# Patient Record
Sex: Male | Born: 1945
Health system: Southern US, Community
[De-identification: ages and names within clinical notes are randomized; demographics above are authoritative.]

## PROBLEM LIST (undated history)

## (undated) DIAGNOSIS — Z87442 Personal history of urinary calculi: Secondary | ICD-10-CM

## (undated) DIAGNOSIS — I2510 Atherosclerotic heart disease of native coronary artery without angina pectoris: Secondary | ICD-10-CM

## (undated) DIAGNOSIS — M1990 Unspecified osteoarthritis, unspecified site: Secondary | ICD-10-CM

## (undated) DIAGNOSIS — M2550 Pain in unspecified joint: Secondary | ICD-10-CM

## (undated) DIAGNOSIS — H269 Unspecified cataract: Secondary | ICD-10-CM

## (undated) DIAGNOSIS — R351 Nocturia: Secondary | ICD-10-CM

## (undated) DIAGNOSIS — K269 Duodenal ulcer, unspecified as acute or chronic, without hemorrhage or perforation: Secondary | ICD-10-CM

## (undated) DIAGNOSIS — I499 Cardiac arrhythmia, unspecified: Secondary | ICD-10-CM

## (undated) DIAGNOSIS — K5790 Diverticulosis of intestine, part unspecified, without perforation or abscess without bleeding: Secondary | ICD-10-CM

## (undated) DIAGNOSIS — E785 Hyperlipidemia, unspecified: Secondary | ICD-10-CM

## (undated) DIAGNOSIS — N3289 Other specified disorders of bladder: Secondary | ICD-10-CM

## (undated) DIAGNOSIS — I209 Angina pectoris, unspecified: Secondary | ICD-10-CM

## (undated) DIAGNOSIS — Z9289 Personal history of other medical treatment: Secondary | ICD-10-CM

## (undated) DIAGNOSIS — K219 Gastro-esophageal reflux disease without esophagitis: Secondary | ICD-10-CM

## (undated) DIAGNOSIS — Z860100 Personal history of colon polyps, unspecified: Secondary | ICD-10-CM

## (undated) DIAGNOSIS — I480 Paroxysmal atrial fibrillation: Secondary | ICD-10-CM

## (undated) HISTORY — DX: Hyperlipidemia, unspecified: E78.5

## (undated) HISTORY — PX: CARDIAC CATHETERIZATION: SHX172

## (undated) HISTORY — DX: Unspecified cataract: H26.9

## (undated) HISTORY — PX: COLONOSCOPY: SHX174

## (undated) HISTORY — DX: Atherosclerotic heart disease of native coronary artery without angina pectoris: I25.10

## (undated) HISTORY — PX: WISDOM TOOTH EXTRACTION: SHX21

---

## 2001-02-24 ENCOUNTER — Encounter (INDEPENDENT_AMBULATORY_CARE_PROVIDER_SITE_OTHER): Payer: Self-pay

## 2001-02-24 ENCOUNTER — Other Ambulatory Visit: Admission: RE | Admit: 2001-02-24 | Discharge: 2001-02-24 | Payer: Self-pay | Admitting: Internal Medicine

## 2003-12-11 ENCOUNTER — Emergency Department (HOSPITAL_COMMUNITY): Admission: EM | Admit: 2003-12-11 | Discharge: 2003-12-11 | Payer: Self-pay | Admitting: Emergency Medicine

## 2005-02-27 ENCOUNTER — Ambulatory Visit: Payer: Self-pay | Admitting: Internal Medicine

## 2005-03-27 ENCOUNTER — Ambulatory Visit: Payer: Self-pay | Admitting: Internal Medicine

## 2005-03-29 ENCOUNTER — Ambulatory Visit: Payer: Self-pay | Admitting: Internal Medicine

## 2005-04-05 ENCOUNTER — Ambulatory Visit: Payer: Self-pay

## 2005-04-25 ENCOUNTER — Encounter: Admission: RE | Admit: 2005-04-25 | Discharge: 2005-04-25 | Payer: Self-pay | Admitting: Internal Medicine

## 2005-08-27 ENCOUNTER — Ambulatory Visit: Payer: Self-pay | Admitting: Internal Medicine

## 2006-04-30 ENCOUNTER — Ambulatory Visit: Payer: Self-pay | Admitting: Internal Medicine

## 2006-07-30 ENCOUNTER — Ambulatory Visit: Payer: Self-pay | Admitting: Internal Medicine

## 2006-08-23 ENCOUNTER — Ambulatory Visit: Payer: Self-pay | Admitting: Internal Medicine

## 2007-08-12 ENCOUNTER — Ambulatory Visit: Payer: Self-pay | Admitting: Internal Medicine

## 2007-08-18 ENCOUNTER — Encounter: Payer: Self-pay | Admitting: Internal Medicine

## 2007-08-21 ENCOUNTER — Encounter: Payer: Self-pay | Admitting: Internal Medicine

## 2007-08-21 ENCOUNTER — Ambulatory Visit: Payer: Self-pay | Admitting: Internal Medicine

## 2008-08-04 ENCOUNTER — Encounter (INDEPENDENT_AMBULATORY_CARE_PROVIDER_SITE_OTHER): Payer: Self-pay | Admitting: *Deleted

## 2008-08-18 ENCOUNTER — Encounter: Payer: Self-pay | Admitting: Internal Medicine

## 2008-12-20 ENCOUNTER — Ambulatory Visit: Payer: Self-pay | Admitting: Internal Medicine

## 2008-12-20 DIAGNOSIS — R198 Other specified symptoms and signs involving the digestive system and abdomen: Secondary | ICD-10-CM | POA: Insufficient documentation

## 2008-12-25 LAB — CONVERTED CEMR LAB
HCT: 46.3 % (ref 39.0–52.0)
MCV: 97.6 fL (ref 78.0–100.0)
Platelets: 237 10*3/uL (ref 150–400)
RDW: 11.8 % (ref 11.5–14.6)

## 2008-12-28 ENCOUNTER — Encounter (INDEPENDENT_AMBULATORY_CARE_PROVIDER_SITE_OTHER): Payer: Self-pay | Admitting: *Deleted

## 2009-02-16 ENCOUNTER — Encounter: Payer: Self-pay | Admitting: Internal Medicine

## 2009-04-29 ENCOUNTER — Encounter: Payer: Self-pay | Admitting: Internal Medicine

## 2009-05-05 ENCOUNTER — Ambulatory Visit: Payer: Self-pay | Admitting: Internal Medicine

## 2009-05-07 LAB — CONVERTED CEMR LAB
ALT: 17 units/L (ref 0–53)
Alkaline Phosphatase: 61 units/L (ref 39–117)
Bilirubin, Direct: 0 mg/dL (ref 0.0–0.3)
Total Protein: 7.2 g/dL (ref 6.0–8.3)

## 2009-05-09 ENCOUNTER — Encounter (INDEPENDENT_AMBULATORY_CARE_PROVIDER_SITE_OTHER): Payer: Self-pay | Admitting: *Deleted

## 2009-05-09 ENCOUNTER — Encounter: Payer: Self-pay | Admitting: Internal Medicine

## 2009-05-25 ENCOUNTER — Ambulatory Visit: Payer: Self-pay | Admitting: Internal Medicine

## 2009-05-25 DIAGNOSIS — E785 Hyperlipidemia, unspecified: Secondary | ICD-10-CM

## 2009-05-25 DIAGNOSIS — F502 Bulimia nervosa: Secondary | ICD-10-CM | POA: Insufficient documentation

## 2009-05-25 HISTORY — DX: Hyperlipidemia, unspecified: E78.5

## 2009-06-14 ENCOUNTER — Telehealth: Payer: Self-pay | Admitting: Internal Medicine

## 2009-09-19 ENCOUNTER — Encounter: Payer: Self-pay | Admitting: Internal Medicine

## 2009-09-20 ENCOUNTER — Ambulatory Visit: Payer: Self-pay | Admitting: Internal Medicine

## 2009-09-20 LAB — CONVERTED CEMR LAB
AST: 19 units/L (ref 0–37)
Albumin: 3.8 g/dL (ref 3.5–5.2)
Alkaline Phosphatase: 71 units/L (ref 39–117)
Hgb A1c MFr Bld: 5.4 % (ref 4.6–6.5)
Total Protein: 7.1 g/dL (ref 6.0–8.3)
VLDL: 17 mg/dL (ref 0.0–40.0)

## 2009-09-26 ENCOUNTER — Ambulatory Visit: Payer: Self-pay | Admitting: Internal Medicine

## 2009-11-23 ENCOUNTER — Telehealth (INDEPENDENT_AMBULATORY_CARE_PROVIDER_SITE_OTHER): Payer: Self-pay | Admitting: *Deleted

## 2009-12-16 ENCOUNTER — Encounter: Payer: Self-pay | Admitting: Internal Medicine

## 2010-08-11 ENCOUNTER — Encounter: Payer: Self-pay | Admitting: Internal Medicine

## 2010-11-21 NOTE — Progress Notes (Signed)
Summary: TRAVEL IMMUNIZATION  Phone Note Call from Patient   Caller: Patient Summary of Call: PT LEFT VM THAT HE WILL BE TRAVELING OUT THE COUNTRY AND WOULD LIKE FOR DR HOPPER TO INFORM HIM IF HE NEEDS TO HAVE A YELLOW FEVER VACCINE.CALL PT BACK LEFT DETAIL MESSAGE PT TO CONTACT TRAVEL CLINIC TO FIND OUT WHAT VACCINE ARE NEEDED........................Marland KitchenFelecia Deloach CMA  November 23, 2009 11:20 AM [phone]

## 2010-11-21 NOTE — Letter (Signed)
Summary: [facility]   Imported By: Lanelle Bal 09/05/2010 09:16:49  _____________________________________________________________________  External Attachment:    Type:   Image     Comment:   External Document

## 2010-11-24 NOTE — Letter (Signed)
Summary: [facility]  [facility]   Imported By: Lanelle Bal 12/22/2009 08:15:00  _____________________________________________________________________  External Attachment:    Type:   Image     Comment:   External Document

## 2011-03-05 ENCOUNTER — Encounter: Payer: Self-pay | Admitting: Internal Medicine

## 2011-03-05 ENCOUNTER — Ambulatory Visit (INDEPENDENT_AMBULATORY_CARE_PROVIDER_SITE_OTHER)
Admission: RE | Admit: 2011-03-05 | Discharge: 2011-03-05 | Disposition: A | Discharge status: Home / Self Care | Payer: Medicare Other | Source: Ambulatory Visit | Attending: Internal Medicine | Admitting: Internal Medicine

## 2011-03-05 ENCOUNTER — Ambulatory Visit (INDEPENDENT_AMBULATORY_CARE_PROVIDER_SITE_OTHER): Payer: Medicare Other | Admitting: Internal Medicine

## 2011-03-05 VITALS — BP 112/74 | Temp 99.3°F | Wt 185.0 lb

## 2011-03-05 DIAGNOSIS — R739 Hyperglycemia, unspecified: Secondary | ICD-10-CM

## 2011-03-05 DIAGNOSIS — R7309 Other abnormal glucose: Secondary | ICD-10-CM

## 2011-03-05 DIAGNOSIS — R079 Chest pain, unspecified: Secondary | ICD-10-CM | POA: Insufficient documentation

## 2011-03-05 DIAGNOSIS — J029 Acute pharyngitis, unspecified: Secondary | ICD-10-CM

## 2011-03-05 LAB — CBC WITH DIFFERENTIAL/PLATELET
Basophils Absolute: 0.1 10*3/uL (ref 0.0–0.1)
Eosinophils Absolute: 0.3 10*3/uL (ref 0.0–0.7)
Lymphocytes Relative: 17.8 % (ref 12.0–46.0)
MCHC: 35 g/dL (ref 30.0–36.0)
MCV: 97.1 fl (ref 78.0–100.0)
Monocytes Absolute: 1.1 10*3/uL — ABNORMAL HIGH (ref 0.1–1.0)
Neutrophils Relative %: 70.9 % (ref 43.0–77.0)
Platelets: 263 10*3/uL (ref 150.0–400.0)
RBC: 4.34 Mil/uL (ref 4.22–5.81)

## 2011-03-05 LAB — HEMOGLOBIN A1C: Hgb A1c MFr Bld: 5.6 % (ref 4.6–6.5)

## 2011-03-05 LAB — BASIC METABOLIC PANEL
Chloride: 103 mEq/L (ref 96–112)
GFR: 110.46 mL/min (ref >60.00)
Potassium: 3.5 mEq/L (ref 3.5–5.1)
Sodium: 138 mEq/L (ref 135–145)

## 2011-03-05 MED ORDER — AMOXICILLIN 500 MG PO CAPS: 1000.0000 mg | ORAL_CAPSULE | Freq: Two times a day (BID) | ORAL | Status: AC

## 2011-03-05 NOTE — Assessment & Plan Note (Signed)
Unclear if related to above. See instructions

## 2011-03-05 NOTE — Patient Instructions (Signed)
Rest , fluids, tylenol , robitussin DM Amoxicillin x 10 days

## 2011-03-05 NOTE — Assessment & Plan Note (Addendum)
Patient presents with sorethroat and chest discomfort, initially I thought CP could be due to tracheitis since he was having URI sx but then, the patient reported similar exertional symptoms 2 weeks ago when he was not having sore throat or any upper respiratory issues. EKG today show sinus rhythm, no acute changes, no old EKGs to compare with.  He has a + FH CAD His A1c at some point was slt elebvated He is a former smoker Plan: Rest, asa, will ask cards to see this week, ER if sx severe. Pt verbalize understanding

## 2011-03-05 NOTE — Progress Notes (Signed)
  Subjective:    Patient ID: Blake [name], male    DOB: [birth year], 65 y.o.   MRN: [id number]  HPI His chief complaint today is sore throat, it started about 4 days ago. However , he  also describes a raw feeling/cold feeling in the chest with exertion first around 2 weeks ago and then again last weekend while walking. The pain is mostly when he takes a deep breath and he is located anteriorly. It is not described as pressure. Pain goes away  after he rests. He wonders if the chest discomfort is related with the sore throat  Past Medical History  Diagnosis Date  . Elevated PSA   . Hyperlipidemia    Past Surgical History  Procedure Date  . Polypectomy     x 2   Family History  Problem Relation Age of Onset  . Coronary artery disease Father   . Heart attack Father 24  . Lymphoma    . Diabetes Sister   . Heart attack Sister 34  . Ovarian cancer Sister   . Atrial fibrillation Brother    History   Social History  . Marital Status: Married    Spouse Name: N/A    Number of Children: 4  . Years of Education: N/A   Occupational History  . retired     Social History Main Topics  . Smoking status: Former Smoker    Quit date: 03/04/1988  . Smokeless tobacco: Not on file  . Alcohol Use: Yes     socially  . Drug Use: Not on file  . Sexually Active: Not on file   Other Topics Concern  . Not on file   Social History Narrative   Regular exercise- yes      Review of Systems No fevers Mild cough No nausea or vomiting. No itchy eyes or itchy nose. Some DOE (?)  during the weekend which is uncommon for him. No LE edema  No GERD sx      Objective:   Physical Exam Alert oriented in no apparent distress. HEENT: Throat is slightly red without discharge, tonsils not visualized. Nose is slightly congested. Ears both tympanic membranes normal. Face is symmetric. Lungs clear to auscultation bilaterally. A few rhonchi. Cardiovascular regular rate and rhythm without  murmur. LE w/o edema        Assessment & Plan:

## 2011-03-06 ENCOUNTER — Encounter: Payer: Self-pay | Admitting: *Deleted

## 2011-03-06 ENCOUNTER — Encounter: Payer: Self-pay | Admitting: Internal Medicine

## 2011-03-09 ENCOUNTER — Encounter: Payer: Self-pay | Admitting: Cardiology

## 2011-03-09 ENCOUNTER — Ambulatory Visit (INDEPENDENT_AMBULATORY_CARE_PROVIDER_SITE_OTHER): Payer: Medicare Other | Admitting: Cardiology

## 2011-03-09 ENCOUNTER — Encounter: Payer: Self-pay | Admitting: *Deleted

## 2011-03-09 DIAGNOSIS — R079 Chest pain, unspecified: Secondary | ICD-10-CM

## 2011-03-09 DIAGNOSIS — R0789 Other chest pain: Secondary | ICD-10-CM

## 2011-03-09 LAB — BASIC METABOLIC PANEL
CO2: 27 mEq/L (ref 19–32)
Calcium: 9.1 mg/dL (ref 8.4–10.5)
GFR: 132.64 mL/min (ref >60.00)
Sodium: 140 mEq/L (ref 135–145)

## 2011-03-09 LAB — CBC WITH DIFFERENTIAL/PLATELET
Basophils Absolute: 0.1 10*3/uL (ref 0.0–0.1)
Eosinophils Relative: 2.4 % (ref 0.0–5.0)
Lymphocytes Relative: 21.8 % (ref 12.0–46.0)
Lymphs Abs: 2.4 10*3/uL (ref 0.7–4.0)
Monocytes Relative: 8.7 % (ref 3.0–12.0)
Neutrophils Relative %: 66.4 % (ref 43.0–77.0)
Platelets: 300 10*3/uL (ref 150.0–400.0)
RDW: 12.6 % (ref 11.5–14.6)
WBC: 10.8 10*3/uL — ABNORMAL HIGH (ref 4.5–10.5)

## 2011-03-09 LAB — PROTIME-INR: INR: 1 ratio (ref 0.8–1.0)

## 2011-03-09 LAB — APTT: aPTT: 28.4 s (ref 21.7–28.8)

## 2011-03-09 NOTE — Patient Instructions (Signed)
.  Your heart catheterization is scheduled for Friday May 25,12.  [facility]30[facility]

## 2011-03-09 NOTE — Progress Notes (Signed)
   Patient ID: Blake [name], male    DOB: December 02, 1945, 65 y.o.   MRN: [id number]  HPI Blake [name] is a 65yo WM who is referred today by Dr Drue Novel for exertional SOB and Chest tightness with a raw feeling.  This began awhile back when mowing the yard. He recently had a bad episode while walking up a hill at his son's graduation. It is relieved with rest. There is no radiation or other associated symptoms.  CRF's include age, sex, FH, obesity, sedentary lifestyle, remote tobacco, and untreated hyperlipidemia...Marland Kitchenhe stopped his pravastatin on his on.  EKG in Dr Leta Jungling office showed NSR with no ST changes.   Review of Systems  All other systems reviewed and are negative.      Physical Exam  Nursing note and vitals reviewed. Constitutional: He is oriented to person, place, and time. He appears well-developed and well-nourished. No distress.       obese  HENT:  Head: Normocephalic and atraumatic.  Eyes: EOM are normal. Pupils are equal, round, and reactive to light.  Neck: Normal range of motion. No JVD present. No tracheal deviation present. No thyromegaly present.  Cardiovascular: Normal rate, regular rhythm, normal heart sounds and intact distal pulses.   No murmur heard.      No carotid bruits  Pulmonary/Chest: Effort normal and breath sounds normal.  Abdominal: Soft. Bowel sounds are normal. He exhibits no distension.  Musculoskeletal: Normal range of motion. He exhibits no edema.  Neurological: He is alert and oriented to person, place, and time.  Skin: Skin is warm and dry.  Psychiatric: He has a normal mood and affect.

## 2011-03-09 NOTE — Assessment & Plan Note (Signed)
This is exertional angina until proven otherwise. A stress study would be of no value and potentially harmful. I have recommended a cardiac cath. Indications, risks, potential benefit discussed. Pt agrees to proceed.

## 2011-03-16 ENCOUNTER — Inpatient Hospital Stay (HOSPITAL_BASED_OUTPATIENT_CLINIC_OR_DEPARTMENT_OTHER)
Admission: RE | Admit: 2011-03-16 | Discharge: 2011-03-16 | Disposition: A | Discharge status: Home / Self Care | Payer: Medicare Other | Source: Ambulatory Visit | Attending: Cardiology | Admitting: Cardiology

## 2011-03-16 DIAGNOSIS — I2510 Atherosclerotic heart disease of native coronary artery without angina pectoris: Secondary | ICD-10-CM | POA: Insufficient documentation

## 2011-03-16 DIAGNOSIS — R0602 Shortness of breath: Secondary | ICD-10-CM | POA: Insufficient documentation

## 2011-03-20 ENCOUNTER — Telehealth: Payer: Self-pay | Admitting: Cardiology

## 2011-03-20 NOTE — Telephone Encounter (Signed)
I spoke with the pt and he said that Dr Riley Kill did his cath on Friday and was suppose to call him after speaking with the surgeons. The pt is wondering what the plan is at this time.  I will speak with Dr Riley Kill about this patient.

## 2011-03-20 NOTE — Telephone Encounter (Signed)
Pt had cath done was told by Dr. Riley Kill to  discuss next plan of care with Dr. Myra Gianotti regarding surgery .

## 2011-03-20 NOTE — Telephone Encounter (Signed)
I spoke with Dr Riley Kill and he would like to arrange an appointment for the pt to see Dr Tyrone Sage tomorrow.

## 2011-03-20 NOTE — Telephone Encounter (Signed)
I spoke with Revonda Standard earlier and she said that Dr Tyrone Sage is currently working in [employer].  She will attempt to reach Dr Tyrone Sage to discuss the plan for this pt. I made Dr Riley Kill aware of this information and he said that Dr Laneta Simmers could also see this patient.  I left this information on Allison's voicemail.

## 2011-03-21 NOTE — Telephone Encounter (Signed)
Dr Riley Kill spoke with the pt by phone and spoke with Revonda Standard at [place] [facility].  Per Dr Ceasar Lund is going to call the pt with a TCTS appointment.

## 2011-03-22 NOTE — Cardiovascular Report (Signed)
NAME:  Blake [name], Blake [name]            ACCOUNT NO.:  [id number]  MEDICAL RECORD NO.:  [id number]            PATIENT TYPE:  LOCATION:                                 FACILITY:  PHYSICIAN:  Arturo Morton. Riley Kill, MD, FACCDATE OF BIRTH:  03/11/1946  DATE OF PROCEDURE:  03/16/2011 DATE OF DISCHARGE:                           CARDIAC CATHETERIZATION   INDICATIONS:  Blake [name] is a very delightful 65 year old gentleman, who presents with shortness of breath while walking across the [place] [facility] and while mowing a lawnmower.  He has a strong family history of coronary artery disease.  He was seen in consultation by Dr. Valera Castle, and set up for diagnostic cardiac catheterization.  Risks, benefits, and alternatives were discussed with the patient in detail. He consented to proceed.  PROCEDURE: 1. Left heart catheterization 2. Selective coronary arteriography. 3. Selective left ventriculography.  DESCRIPTION OF PROCEDURE:  The procedure was performed from the right femoral artery using 4-French catheters.  We upgraded to a 5-curved catheter to better engage the left main.  There were no major complications.  He was taken to the holding area in satisfactory clinical condition.  HEMODYNAMIC DATA: 1. The central aortic pressure was 106/62, mean 82. 2. LV pressure 116/69. 3. There was no gradient on pullback across the aortic valve.  ANGIOGRAPHIC DATA: 1. Ventriculography done in the RAO projection reveals well-preserved     global systolic function without segmental wall motion abnormality. 2. On plain fluoroscopy, there is heavy calcification of all 3     coronary vessels including the left main. 3. The left main coronary artery demonstrates an ostial 40%-50% area     of narrowing.  It is segmentally diseased along its very proximal     portion, there was no damping of the catheter on engagement of the     left main, however. 4. The left anterior descending artery is heavily  calcified.  There is     a long 75% area of stenosis just after two tiny diagonal takeoffs.     This vessel was heavily calcified, and would likely require     percutaneous rotational atherectomy for percutaneous approach.     There is a second area of narrowing of calcified plaque just beyond     the third diagonal, which itself is tiny, involves about 50%     narrowing.  There is some diffuse irregularity of the distal LAD,     but no critical stenoses. 5. The circumflex itself is a large-caliber vessel.  There are some     luminal irregularities with about 30% proximal calcified plaque.     There is a tiny marginal branch followed by a large marginal and     posterolateral branch mostly of which are free of obstructive     disease.  They supplied a large portion of the lateral wall. 6. Right coronary artery is also heavily calcified.  The entire vessel     could be visualized on plain fluoroscopy in terms of location.  The     right coronary artery has multiple areas of mild luminal     irregularities.  There  is areas of narrowing distally there about     30% in terms of luminal reduction.  The posterior descending and     posterolateral branches have somewhat tapered in appearance,     suggesting a probable diffuse plaque.  However, high-grade disease     is not noted and diffuse calcified plaque is noted throughout the     right coronary without significant obstruction.  CONCLUSION: 1. Well-preserved left ventricular function with estimated ejection     fraction in excess of 55%-60%.  No significant valvular     regurgitation is noted. 2. Moderate stenosis involving the ostium of the left main coronary     artery. 3. 75% segmental diffuse heavily calcified plaque involving the     midportion of the left anterior descending artery. 4. Other findings as noted above.  DISCUSSION:  My suspicion is that he is symptomatic from the mid left anterior descending stenosis.  It is heavily  calcified and by its appearance would highly likely require percutaneous rotational atherectomy as an adjunct to stenting.  His left main does not appear to be critical, but nonetheless is calcified, diffusely plaqued, particularly at the ostium.  Multiple options are potentially available. I plan to review these with the cardiovascular surgeons before making a final recommendation.     Arturo Morton. Riley Kill, MD, [facility]     TDS/MEDQ  D:  03/16/2011  T:  03/17/2011  Job:  [id number]  cc:   CV laboratory Jesse Sans. Daleen Squibb, MD, [facility] Titus Dubin. Alwyn Ren, MD,FACP,FCCP  Electronically Signed by Shawnie Pons MD [facility] on 03/22/2011 09:20:31 AM

## 2011-03-23 ENCOUNTER — Ambulatory Visit (HOSPITAL_COMMUNITY)
Admission: RE | Admit: 2011-03-23 | Discharge: 2011-03-23 | Disposition: A | Discharge status: Home / Self Care | Payer: Medicare Other | Source: Ambulatory Visit | Attending: Cardiothoracic Surgery | Admitting: Cardiothoracic Surgery

## 2011-03-23 ENCOUNTER — Encounter (INDEPENDENT_AMBULATORY_CARE_PROVIDER_SITE_OTHER): Payer: Medicare Other | Admitting: Cardiothoracic Surgery

## 2011-03-23 ENCOUNTER — Inpatient Hospital Stay (HOSPITAL_COMMUNITY)
Admission: RE | Admit: 2011-03-23 | Discharge: 2011-03-23 | Disposition: A | Discharge status: Home / Self Care | Payer: Medicare Other | Source: Ambulatory Visit | Attending: Cardiothoracic Surgery | Admitting: Cardiothoracic Surgery

## 2011-03-23 ENCOUNTER — Encounter (HOSPITAL_COMMUNITY)
Admission: RE | Admit: 2011-03-23 | Discharge: 2011-03-23 | Disposition: A | Discharge status: Home / Self Care | Payer: Medicare Other | Source: Ambulatory Visit | Attending: Cardiothoracic Surgery | Admitting: Cardiothoracic Surgery

## 2011-03-23 ENCOUNTER — Other Ambulatory Visit: Payer: Self-pay | Admitting: Cardiothoracic Surgery

## 2011-03-23 DIAGNOSIS — Z01812 Encounter for preprocedural laboratory examination: Secondary | ICD-10-CM | POA: Insufficient documentation

## 2011-03-23 DIAGNOSIS — I2510 Atherosclerotic heart disease of native coronary artery without angina pectoris: Secondary | ICD-10-CM

## 2011-03-23 DIAGNOSIS — Z01811 Encounter for preprocedural respiratory examination: Secondary | ICD-10-CM | POA: Insufficient documentation

## 2011-03-23 DIAGNOSIS — Z01810 Encounter for preprocedural cardiovascular examination: Secondary | ICD-10-CM

## 2011-03-23 DIAGNOSIS — Z01818 Encounter for other preprocedural examination: Secondary | ICD-10-CM | POA: Insufficient documentation

## 2011-03-23 LAB — CBC
HCT: 44.8 % (ref 39.0–52.0)
Hemoglobin: 15.5 g/dL (ref 13.0–17.0)
MCH: 33.3 pg (ref 26.0–34.0)
MCHC: 34.6 g/dL (ref 30.0–36.0)
MCV: 96.1 fL (ref 78.0–100.0)
Platelets: 305 10*3/uL (ref 150–400)
RBC: 4.66 MIL/uL (ref 4.22–5.81)
RDW: 12.7 % (ref 11.5–15.5)
WBC: 9.9 10*3/uL (ref 4.0–10.5)

## 2011-03-23 LAB — URINE MICROSCOPIC-ADD ON

## 2011-03-23 LAB — URINALYSIS, ROUTINE W REFLEX MICROSCOPIC
Bilirubin Urine: NEGATIVE
Glucose, UA: NEGATIVE mg/dL
Ketones, ur: NEGATIVE mg/dL
Leukocytes, UA: NEGATIVE
Nitrite: NEGATIVE
Protein, ur: NEGATIVE mg/dL
Specific Gravity, Urine: 1.02 (ref 1.005–1.030)
Urobilinogen, UA: 0.2 mg/dL (ref 0.0–1.0)
pH: 5 (ref 5.0–8.0)

## 2011-03-23 LAB — COMPREHENSIVE METABOLIC PANEL
ALT: 14 U/L (ref 0–53)
AST: 16 U/L (ref 0–37)
Albumin: 3.8 g/dL (ref 3.5–5.2)
Alkaline Phosphatase: 83 U/L (ref 39–117)
BUN: 13 mg/dL (ref 6–23)
CO2: 22 mEq/L (ref 19–32)
Calcium: 9.5 mg/dL (ref 8.4–10.5)
Chloride: 105 mEq/L (ref 96–112)
Creatinine, Ser: 0.66 mg/dL (ref 0.4–1.5)
GFR calc Af Amer: >60 mL/min (ref >60)
GFR calc non Af Amer: >60 mL/min (ref >60)
Glucose, Bld: 98 mg/dL (ref 70–99)
Potassium: 4.2 mEq/L (ref 3.5–5.1)
Sodium: 139 mEq/L (ref 135–145)
Total Bilirubin: 0.6 mg/dL (ref 0.3–1.2)
Total Protein: 7 g/dL (ref 6.0–8.3)

## 2011-03-23 LAB — BLOOD GAS, ARTERIAL
Acid-base deficit: 1 mmol/L (ref 0.0–2.0)
Bicarbonate: 23.1 mEq/L (ref 20.0–24.0)
Drawn by: 206361
FIO2: 0.21 %
O2 Saturation: 93.2 %
Patient temperature: 98.6
TCO2: 24.3 mmol/L (ref 0–100)
pCO2 arterial: 37.8 mmHg (ref 35.0–45.0)
pH, Arterial: 7.403 (ref 7.350–7.450)
pO2, Arterial: 67 mmHg — ABNORMAL LOW (ref 80.0–100.0)

## 2011-03-23 LAB — ABO/RH: ABO/RH(D): A POS

## 2011-03-23 LAB — SURGICAL PCR SCREEN
MRSA, PCR: NEGATIVE
Staphylococcus aureus: POSITIVE — AB

## 2011-03-23 LAB — PROTIME-INR
INR: 0.94 (ref 0.00–1.49)
Prothrombin Time: 12.8 seconds (ref 11.6–15.2)

## 2011-03-23 LAB — APTT: aPTT: 28 seconds (ref 24–37)

## 2011-03-23 LAB — HEMOGLOBIN A1C
Hgb A1c MFr Bld: 5.5 % (ref <5.7)
Mean Plasma Glucose: 111 mg/dL (ref <117)

## 2011-03-25 NOTE — Consult Note (Signed)
NEW PATIENT CONSULTATION  Blake, [name] DOB:  1946-10-14                                        March 25, 2011 CHART #:  [id number]  PHYSICIAN REQUESTING CONSULTATION:  Arturo Morton. Riley Kill, MD, [facility].  PRIMARY CARDIOLOGIST:  Jesse Sans. Daleen Squibb, MD, [facility]  PRIMARY CARE PHYSICIAN:  Titus Dubin. Hopper, MD,FACP, FCCP  REASON FOR CONSULTATION:  Severe multivessel coronary disease with left main stenosis and class III exertional angina.  CHIEF COMPLAINT:  Exertional shortness of breath and chest tightness.  HISTORY OF PRESENT ILLNESS:  I was asked to evaluate this 65 year old overweight Caucasian male nonsmoker for possible multivessel bypass grafting.  The patient has a strong family history of coronary artery disease.  He recently has had 2 episodes of exertional shortness of breath and chest tightness. The first was walking across [facility] up a hill at a graduation ceremony and the second was while mowing his lawn. He denies any resting symptoms.  He has never had symptoms of coronary disease or MI previously.  There is no history of cardiac arrhythmia and he is in sinus rhythm.  He was evaluated by Dr. Daleen Squibb who recommended cardiac catheterization which was performed on May 25.  This demonstrated very heavily calcified coronaries diffusely.  There is a 50- 60% left main stenosis with heavy calcium.  There is a long 75% stenosis of the LAD with heavy calcification.  There were some minimal luminal irregularities of the right coronary without significant stenosis.  His LVEF was normal and LVEDP was 19 mmHg.  Based on his long LAD stenosis which was heavily calcified, he was not felt to be a good candidate for percutaneous intervention and a surgical evaluation was requested.  The patient has been stable since the cath.  He denies any nocturnal symptoms.  PAST MEDICAL HISTORY: 1. Reformed smoker. 2. Obesity. 3. BPH.  ALLERGIES:  No known drug allergies.  HOME  MEDICATIONS:  Aspirin 81 mg a day and Robitussin p.r.n.  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  He is married with children.  He drinks occasionally and stopped smoking in 1991.  FAMILY HISTORY:  Positive for CAD.  One sister had bypass surgery at age 39.  REVIEW OF SYSTEMS:  CONSTITUTIONAL REVIEW:  Significant for 30-pound weight fluctuations which occurred in cyclical fashion.  He is currently with the high end of his normal weight at 184 pounds.  He denies any night sweats or fever.  ENT REVIEW:  Negative for difficulty swallowing, sleep apnea, or dental disease.  THORACIC REVIEW:  Negative for history of abnormal chest x-ray, chest trauma, hemoptysis, recent symptoms of upper respiratory infection.  CARDIAC REVIEW:  Positive for known coronary disease with preserved LV function.  No history of murmur, valvular disease, or cardiac arrhythmia.  GI REVIEW:  Negative for hepatitis, jaundice, or blood per rectum.  NEUROLOGIC REVIEW:  Positive for his BPH.  ENDOCRINE REVIEW:  Negative for diabetes or thyroid disease.  VASCULAR REVIEW:  Negative DVT, claudication, or TIA. Vascular lab Doppler studies are pending.  NEUROLOGIC REVIEW:  Negative for stroke or seizure.  PHYSICAL EXAMINATION:  VITAL SIGNS:  The patient is 5 feet 6 inches and weighs 185 pounds.  Blood pressure 108/70, pulse 100, respirations 18, saturation 94%. GENERAL:  He is alert and oriented. HEENT:  Normocephalic and pupils are  equal and reactive.  Dentition is good. NECK:  Without JVD, mass, or carotid bruit. LYMPHATICS:  No palpable cervical or supraclavicular adenopathy. LUNGS:  Breath sounds are clear and equal and there is no thoracic deformity. CARDIAC:  Regular rhythm without S3 gallop or murmur. ABDOMEN:  Soft, obese without pulsatile mass or tenderness. EXTREMITIES:  No clubbing, cyanosis, or edema.  Peripheral pulses are 2+ in all extremities. NEUROLOGIC:  He is alert and  oriented without focal motor deficit.  LABORATORY DATA:  In the office his FEV-1 is 2.4 with FEC of 4.0, both over 90% predicted.  Diffusion capacity is normal.  His coronary arteriograms are reviewed and demonstrate Dr. Rosalyn Charters interpretation of severe calcification, diffuse narrowing of approximately 75% of the proximal LAD and extending into the left main.  Right coronary with no hemodynamically significant stenoses.  RECOMMENDATIONS:  The patient is having active angina with exertion in his anatomy which would be difficult to treat percutaneously.  I reviewed the multivessel bypass grafting with grafts being planned to the LAD and circumflex and possibly the first diagonal branch of LAD if it is a large enough target.  Surgery will be scheduled for June 5.  I have discussed the procedure in detail with the patient including indications, alternatives, risks, and expected postoperative covering. He understands and agrees to proceed.  Blake [name], M.D. Electronically Signed  PV/MEDQ  D:  03/25/2011  T:  03/25/2011  Job:  [id number]

## 2011-03-26 ENCOUNTER — Telehealth: Payer: Self-pay | Admitting: Cardiology

## 2011-03-26 NOTE — Telephone Encounter (Signed)
All Cardiac faxed to [facility] @ [phone]   03/26/11/km

## 2011-03-27 ENCOUNTER — Inpatient Hospital Stay (HOSPITAL_COMMUNITY)
Admission: RE | Admit: 2011-03-27 | Discharge: 2011-04-03 | DRG: 236 | Disposition: A | Discharge status: Home Health | Payer: Medicare Other | Source: Ambulatory Visit | Attending: Cardiothoracic Surgery | Admitting: Cardiothoracic Surgery

## 2011-03-27 ENCOUNTER — Inpatient Hospital Stay (HOSPITAL_COMMUNITY): Payer: Medicare Other

## 2011-03-27 DIAGNOSIS — E8779 Other fluid overload: Secondary | ICD-10-CM | POA: Diagnosis not present

## 2011-03-27 DIAGNOSIS — Z7982 Long term (current) use of aspirin: Secondary | ICD-10-CM

## 2011-03-27 DIAGNOSIS — D62 Acute posthemorrhagic anemia: Secondary | ICD-10-CM | POA: Diagnosis not present

## 2011-03-27 DIAGNOSIS — I4891 Unspecified atrial fibrillation: Secondary | ICD-10-CM | POA: Diagnosis not present

## 2011-03-27 DIAGNOSIS — E669 Obesity, unspecified: Secondary | ICD-10-CM | POA: Diagnosis present

## 2011-03-27 DIAGNOSIS — I2510 Atherosclerotic heart disease of native coronary artery without angina pectoris: Secondary | ICD-10-CM

## 2011-03-27 DIAGNOSIS — Z87891 Personal history of nicotine dependence: Secondary | ICD-10-CM

## 2011-03-27 DIAGNOSIS — K5900 Constipation, unspecified: Secondary | ICD-10-CM | POA: Diagnosis not present

## 2011-03-27 DIAGNOSIS — D72829 Elevated white blood cell count, unspecified: Secondary | ICD-10-CM | POA: Diagnosis not present

## 2011-03-27 DIAGNOSIS — N400 Enlarged prostate without lower urinary tract symptoms: Secondary | ICD-10-CM | POA: Diagnosis present

## 2011-03-27 HISTORY — PX: CORONARY ARTERY BYPASS GRAFT: SHX141

## 2011-03-27 LAB — POCT I-STAT 3, ART BLOOD GAS (G3+)
Acid-base deficit: 4 mmol/L — ABNORMAL HIGH (ref 0.0–2.0)
Acid-base deficit: 2 mmol/L (ref 0.0–2.0)
Acid-base deficit: 5 mmol/L — ABNORMAL HIGH (ref 0.0–2.0)
Bicarbonate: 21.3 mEq/L (ref 20.0–24.0)
Bicarbonate: 22.9 mEq/L (ref 20.0–24.0)
Bicarbonate: 20.4 mEq/L (ref 20.0–24.0)
O2 Saturation: 98 %
O2 Saturation: 99 %
O2 Saturation: 99 %
Patient temperature: 36
Patient temperature: 36.6
Patient temperature: 36.8
TCO2: 22 mmol/L (ref 0–100)
TCO2: 24 mmol/L (ref 0–100)
TCO2: 21 mmol/L (ref 0–100)
pCO2 arterial: 37.1 mmHg (ref 35.0–45.0)
pCO2 arterial: 40.1 mmHg (ref 35.0–45.0)
pCO2 arterial: 35.6 mmHg (ref 35.0–45.0)
pH, Arterial: 7.363 (ref 7.350–7.450)
pH, Arterial: 7.363 (ref 7.350–7.450)
pH, Arterial: 7.365 (ref 7.350–7.450)
pO2, Arterial: 106 mmHg — ABNORMAL HIGH (ref 80.0–100.0)
pO2, Arterial: 159 mmHg — ABNORMAL HIGH (ref 80.0–100.0)
pO2, Arterial: 126 mmHg — ABNORMAL HIGH (ref 80.0–100.0)

## 2011-03-27 LAB — CBC
HCT: 30.4 % — ABNORMAL LOW (ref 39.0–52.0)
HCT: 32.7 % — ABNORMAL LOW (ref 39.0–52.0)
Hemoglobin: 10.4 g/dL — ABNORMAL LOW (ref 13.0–17.0)
Hemoglobin: 11 g/dL — ABNORMAL LOW (ref 13.0–17.0)
MCH: 32.5 pg (ref 26.0–34.0)
MCH: 32 pg (ref 26.0–34.0)
MCHC: 34.2 g/dL (ref 30.0–36.0)
MCHC: 33.6 g/dL (ref 30.0–36.0)
MCV: 95 fL (ref 78.0–100.0)
MCV: 95.1 fL (ref 78.0–100.0)
Platelets: 169 10*3/uL (ref 150–400)
Platelets: 174 10*3/uL (ref 150–400)
RBC: 3.2 MIL/uL — ABNORMAL LOW (ref 4.22–5.81)
RBC: 3.44 MIL/uL — ABNORMAL LOW (ref 4.22–5.81)
RDW: 12.3 % (ref 11.5–15.5)
RDW: 12.2 % (ref 11.5–15.5)
WBC: 19 10*3/uL — ABNORMAL HIGH (ref 4.0–10.5)
WBC: 19.7 10*3/uL — ABNORMAL HIGH (ref 4.0–10.5)

## 2011-03-27 LAB — POCT I-STAT, CHEM 8
BUN: 12 mg/dL (ref 6–23)
Calcium, Ion: 1.19 mmol/L (ref 1.12–1.32)
Chloride: 108 mEq/L (ref 96–112)
Creatinine, Ser: 0.8 mg/dL (ref 0.4–1.5)
Glucose, Bld: 116 mg/dL — ABNORMAL HIGH (ref 70–99)
HCT: 31 % — ABNORMAL LOW (ref 39.0–52.0)
Hemoglobin: 10.5 g/dL — ABNORMAL LOW (ref 13.0–17.0)
Potassium: 4.1 mEq/L (ref 3.5–5.1)
Sodium: 139 mEq/L (ref 135–145)
TCO2: 21 mmol/L (ref 0–100)

## 2011-03-27 LAB — POCT I-STAT 4, (NA,K, GLUC, HGB,HCT)
Glucose, Bld: 91 mg/dL (ref 70–99)
HCT: 31 % — ABNORMAL LOW (ref 39.0–52.0)
Hemoglobin: 10.5 g/dL — ABNORMAL LOW (ref 13.0–17.0)
Potassium: 4 mEq/L (ref 3.5–5.1)
Sodium: 140 mEq/L (ref 135–145)

## 2011-03-27 LAB — GLUCOSE, CAPILLARY
Glucose-Capillary: 102 mg/dL — ABNORMAL HIGH (ref 70–99)
Glucose-Capillary: 94 mg/dL (ref 70–99)
Glucose-Capillary: 114 mg/dL — ABNORMAL HIGH (ref 70–99)

## 2011-03-27 LAB — PLATELET COUNT: Platelets: 207 10*3/uL (ref 150–400)

## 2011-03-27 LAB — CREATININE, SERUM
Creatinine, Ser: 0.55 mg/dL (ref 0.4–1.5)
GFR calc Af Amer: >60 mL/min (ref >60)
GFR calc non Af Amer: >60 mL/min (ref >60)

## 2011-03-27 LAB — APTT: aPTT: 36 seconds (ref 24–37)

## 2011-03-27 LAB — HEMOGLOBIN AND HEMATOCRIT, BLOOD
HCT: 30.3 % — ABNORMAL LOW (ref 39.0–52.0)
Hemoglobin: 10.3 g/dL — ABNORMAL LOW (ref 13.0–17.0)

## 2011-03-27 LAB — MAGNESIUM: Magnesium: 2.9 mg/dL — ABNORMAL HIGH (ref 1.5–2.5)

## 2011-03-27 LAB — PROTIME-INR
INR: 1.48 (ref 0.00–1.49)
Prothrombin Time: 18.1 seconds — ABNORMAL HIGH (ref 11.6–15.2)

## 2011-03-28 ENCOUNTER — Inpatient Hospital Stay (HOSPITAL_COMMUNITY): Payer: Medicare Other

## 2011-03-28 DIAGNOSIS — E1165 Type 2 diabetes mellitus with hyperglycemia: Secondary | ICD-10-CM

## 2011-03-28 DIAGNOSIS — IMO0001 Reserved for inherently not codable concepts without codable children: Secondary | ICD-10-CM

## 2011-03-28 LAB — GLUCOSE, CAPILLARY
Glucose-Capillary: 106 mg/dL — ABNORMAL HIGH (ref 70–99)
Glucose-Capillary: 110 mg/dL — ABNORMAL HIGH (ref 70–99)
Glucose-Capillary: 114 mg/dL — ABNORMAL HIGH (ref 70–99)
Glucose-Capillary: 117 mg/dL — ABNORMAL HIGH (ref 70–99)
Glucose-Capillary: 153 mg/dL — ABNORMAL HIGH (ref 70–99)

## 2011-03-28 LAB — CBC
HCT: 31 % — ABNORMAL LOW (ref 39.0–52.0)
Hemoglobin: 10.6 g/dL — ABNORMAL LOW (ref 13.0–17.0)
MCH: 32.5 pg (ref 26.0–34.0)
MCHC: 34.2 g/dL (ref 30.0–36.0)
MCV: 95.1 fL (ref 78.0–100.0)
Platelets: 164 10*3/uL (ref 150–400)
RBC: 3.26 MIL/uL — ABNORMAL LOW (ref 4.22–5.81)
RDW: 12.4 % (ref 11.5–15.5)
WBC: 17.5 10*3/uL — ABNORMAL HIGH (ref 4.0–10.5)

## 2011-03-28 LAB — BASIC METABOLIC PANEL
BUN: 11 mg/dL (ref 6–23)
CO2: 25 mEq/L (ref 19–32)
Calcium: 7.4 mg/dL — ABNORMAL LOW (ref 8.4–10.5)
Chloride: 106 mEq/L (ref 96–112)
Creatinine, Ser: 0.5 mg/dL (ref 0.4–1.5)
GFR calc Af Amer: >60 mL/min (ref >60)
GFR calc non Af Amer: >60 mL/min (ref >60)
Glucose, Bld: 113 mg/dL — ABNORMAL HIGH (ref 70–99)
Potassium: 3.9 mEq/L (ref 3.5–5.1)
Sodium: 137 mEq/L (ref 135–145)

## 2011-03-28 LAB — MAGNESIUM: Magnesium: 2.2 mg/dL (ref 1.5–2.5)

## 2011-03-29 ENCOUNTER — Inpatient Hospital Stay (HOSPITAL_COMMUNITY): Payer: Medicare Other

## 2011-03-29 ENCOUNTER — Encounter: Payer: Medicare Other | Admitting: Physician Assistant

## 2011-03-29 LAB — POCT I-STAT 3, ART BLOOD GAS (G3+)
Acid-Base Excess: 3 mmol/L — ABNORMAL HIGH (ref 0.0–2.0)
Acid-base deficit: 2 mmol/L (ref 0.0–2.0)
Bicarbonate: 27.3 mEq/L — ABNORMAL HIGH (ref 20.0–24.0)
Bicarbonate: 23.1 mEq/L (ref 20.0–24.0)
O2 Saturation: 100 %
O2 Saturation: 99 %
TCO2: 29 mmol/L (ref 0–100)
TCO2: 24 mmol/L (ref 0–100)
pCO2 arterial: 40.3 mmHg (ref 35.0–45.0)
pCO2 arterial: 38 mmHg (ref 35.0–45.0)
pH, Arterial: 7.439 (ref 7.350–7.450)
pH, Arterial: 7.392 (ref 7.350–7.450)
pO2, Arterial: 415 mmHg — ABNORMAL HIGH (ref 80.0–100.0)
pO2, Arterial: 143 mmHg — ABNORMAL HIGH (ref 80.0–100.0)

## 2011-03-29 LAB — POCT I-STAT 4, (NA,K, GLUC, HGB,HCT)
Glucose, Bld: 107 mg/dL — ABNORMAL HIGH (ref 70–99)
Glucose, Bld: 110 mg/dL — ABNORMAL HIGH (ref 70–99)
Glucose, Bld: 100 mg/dL — ABNORMAL HIGH (ref 70–99)
Glucose, Bld: 128 mg/dL — ABNORMAL HIGH (ref 70–99)
Glucose, Bld: 151 mg/dL — ABNORMAL HIGH (ref 70–99)
HCT: 37 % — ABNORMAL LOW (ref 39.0–52.0)
HCT: 37 % — ABNORMAL LOW (ref 39.0–52.0)
HCT: 29 % — ABNORMAL LOW (ref 39.0–52.0)
HCT: 30 % — ABNORMAL LOW (ref 39.0–52.0)
HCT: 29 % — ABNORMAL LOW (ref 39.0–52.0)
Hemoglobin: 12.6 g/dL — ABNORMAL LOW (ref 13.0–17.0)
Hemoglobin: 12.6 g/dL — ABNORMAL LOW (ref 13.0–17.0)
Hemoglobin: 9.9 g/dL — ABNORMAL LOW (ref 13.0–17.0)
Hemoglobin: 10.2 g/dL — ABNORMAL LOW (ref 13.0–17.0)
Hemoglobin: 9.9 g/dL — ABNORMAL LOW (ref 13.0–17.0)
Potassium: 3.4 mEq/L — ABNORMAL LOW (ref 3.5–5.1)
Potassium: 3.9 mEq/L (ref 3.5–5.1)
Potassium: 4.1 mEq/L (ref 3.5–5.1)
Potassium: 4 mEq/L (ref 3.5–5.1)
Potassium: 4.1 mEq/L (ref 3.5–5.1)
Sodium: 143 mEq/L (ref 135–145)
Sodium: 139 mEq/L (ref 135–145)
Sodium: 141 mEq/L (ref 135–145)
Sodium: 136 mEq/L (ref 135–145)
Sodium: 135 mEq/L (ref 135–145)

## 2011-03-29 LAB — BASIC METABOLIC PANEL
BUN: 14 mg/dL (ref 6–23)
CO2: 27 mEq/L (ref 19–32)
Calcium: 8.6 mg/dL (ref 8.4–10.5)
Chloride: 101 mEq/L (ref 96–112)
Creatinine, Ser: 0.62 mg/dL (ref 0.4–1.5)
GFR calc Af Amer: >60 mL/min (ref >60)
GFR calc non Af Amer: >60 mL/min (ref >60)
Glucose, Bld: 128 mg/dL — ABNORMAL HIGH (ref 70–99)
Potassium: 3.6 mEq/L (ref 3.5–5.1)
Sodium: 136 mEq/L (ref 135–145)

## 2011-03-29 LAB — CROSSMATCH
ABO/RH(D): A POS
Antibody Screen: NEGATIVE
Unit division: 0
Unit division: 0

## 2011-03-29 LAB — GLUCOSE, CAPILLARY
Glucose-Capillary: 118 mg/dL — ABNORMAL HIGH (ref 70–99)
Glucose-Capillary: 160 mg/dL — ABNORMAL HIGH (ref 70–99)
Glucose-Capillary: 132 mg/dL — ABNORMAL HIGH (ref 70–99)
Glucose-Capillary: 116 mg/dL — ABNORMAL HIGH (ref 70–99)
Glucose-Capillary: 107 mg/dL — ABNORMAL HIGH (ref 70–99)
Glucose-Capillary: 107 mg/dL — ABNORMAL HIGH (ref 70–99)

## 2011-03-29 LAB — URINALYSIS, ROUTINE W REFLEX MICROSCOPIC
Bilirubin Urine: NEGATIVE
Glucose, UA: NEGATIVE mg/dL
Ketones, ur: NEGATIVE mg/dL
Leukocytes, UA: NEGATIVE
Nitrite: NEGATIVE
Protein, ur: NEGATIVE mg/dL
Specific Gravity, Urine: 1.021 (ref 1.005–1.030)
Urobilinogen, UA: 0.2 mg/dL (ref 0.0–1.0)
pH: 5.5 (ref 5.0–8.0)

## 2011-03-29 LAB — CBC
HCT: 36.1 % — ABNORMAL LOW (ref 39.0–52.0)
Hemoglobin: 12.3 g/dL — ABNORMAL LOW (ref 13.0–17.0)
MCH: 33 pg (ref 26.0–34.0)
MCHC: 34.1 g/dL (ref 30.0–36.0)
MCV: 96.8 fL (ref 78.0–100.0)
Platelets: 172 10*3/uL (ref 150–400)
RBC: 3.73 MIL/uL — ABNORMAL LOW (ref 4.22–5.81)
RDW: 12.7 % (ref 11.5–15.5)
WBC: 24.3 10*3/uL — ABNORMAL HIGH (ref 4.0–10.5)

## 2011-03-29 LAB — URINE MICROSCOPIC-ADD ON

## 2011-03-29 NOTE — Op Note (Signed)
NAMEMarland Kitchen  QUINTARIUS, FERNS NO.:  [id number]  MEDICAL RECORD NO.:  [id number]  LOCATION:  2307                         FACILITY:  [facility]  PHYSICIAN:  Guadalupe Maple, M.D.  DATE OF BIRTH:  [birth year]  DATE OF PROCEDURE:  03/27/2011 DATE OF DISCHARGE:                              OPERATIVE REPORT   PROCEDURE:  Intraoperative transesophageal echocardiography.  Mr. Blake [name] is a 65 year old Caucasian male who developed exertional chest pain and subsequently underwent cardiac catheterization which revealed a 60% left main stenosis and a 75% long stenosis of the LAD with heavy calcification which was not felt amenable to percutaneous angioplasty.  Left ventricular function was normal.  He is now scheduled to undergo coronary artery bypass grafting by Dr. Kathlee Nations Trigt. Intraoperative transesophageal echocardiography was requested to evaluate the left and right ventricular function, to determine if any valvular pathology was present, and to serve as a monitor for intraoperative volume status.  The patient was brought to the operating room at [facility] and general anesthesia was induced without difficulty.  The trachea was intubated without difficulty.  Following orogastric suctioning, the transesophageal echocardiography probe was then inserted into the esophagus without difficulty.  PREBYPASS FINDINGS: 1. Aortic valve.  The aortic valve was trileaflet.  The leaflets     opened normally.  There was no aortic insufficiency.  There was no     calcification of the leaflets.  The leaflets were mildly thickened. 2. Mitral valve.  There was slight redundancy of the mitral leaflets     and the leaflets coapted well with some degree of billowing,     however, this did fulfill the criteria for mitral valve prolapse     because the leaflets did not move above the plane of the mitral     annulus.  There was trace mitral insufficiency.  There were no  fluttering or flail segments noted.  There was very mild mitral     annular calcification. 3. Left ventricle.  There was normal-appearing left ventricular     function.  There was vigorous contractility in all segments and his     ejection fraction was estimated at 60%.  The left ventricular end-     diastolic diameter measured 4.65 cm at end diastole at the mid     papillary level of the short axis.  Left ventricular wall thickness     measured 0.9-0.95 cm at end diastole at the mid papillary level. 4. Right ventricle:  There was normal appearing right ventricular     function.  The right ventricle was of normal size and there was     normal contractility of the right ventricular free wall. 5. Tricuspid valve.  The tricuspid valve appeared structurally intact     with trace tricuspid insufficiency. 6. Pulmonic valve.  The pulmonic valve was visualized and there was no     pulmonic insufficiency that could be appreciated by color Doppler. 7. Left interatrial septum.  The interatrial septum was intact without     evidence of patent foramen ovale or atrial septal defect by color     Doppler. 8. Left atrium.  The left atrial cavity appeared to  be within normal     limits of size.  There was no thrombus noted in the left atrium or     left atrial appendage. 9. Ascending aorta.  The ascending aorta showed a well-defined     sinotubular ridge in the aortic root without evidence of aneurysmal     dilatation of the ascending aorta. There was no significant     atheromatous disease that could be appreciated.  POSTBYPASS FINDINGS: 1. Aortic valve.  The aortic valve was unchanged from the prebypass     study.  There was no aortic insufficiency and the valve leaflets     opened normally. 2. Mitral valve.  There was trace mitral insufficiency with slight     redundancy of the leaflets, but no frank prolapse. 3. Left ventricle.  There was normal-appearing left ventricular     function which  appeared unchanged from the prebypass study. 4. Right ventricle.  There was normal-appearing right ventricular     function postbypass which appeared unchanged from the prebypass     study.          ______________________________ Guadalupe Maple, M.D.     DCJ/MEDQ  D:  03/27/2011  T:  03/28/2011  Job:  [id number]  Electronically Signed by Kipp Brood M.D. on 03/29/2011 09:36:09 AM

## 2011-03-30 ENCOUNTER — Inpatient Hospital Stay (HOSPITAL_COMMUNITY): Payer: Medicare Other

## 2011-03-30 LAB — GLUCOSE, CAPILLARY: Glucose-Capillary: 111 mg/dL — ABNORMAL HIGH (ref 70–99)

## 2011-03-30 LAB — CBC
HCT: 33.1 % — ABNORMAL LOW (ref 39.0–52.0)
Hemoglobin: 11 g/dL — ABNORMAL LOW (ref 13.0–17.0)
MCH: 32.1 pg (ref 26.0–34.0)
MCHC: 33.2 g/dL (ref 30.0–36.0)
MCV: 96.5 fL (ref 78.0–100.0)
Platelets: 178 10*3/uL (ref 150–400)
RBC: 3.43 MIL/uL — ABNORMAL LOW (ref 4.22–5.81)
RDW: 12.7 % (ref 11.5–15.5)
WBC: 20 10*3/uL — ABNORMAL HIGH (ref 4.0–10.5)

## 2011-03-30 LAB — URINE CULTURE
Colony Count: NO GROWTH
Culture  Setup Time: 201206071825
Culture: NO GROWTH

## 2011-03-31 LAB — CBC
HCT: 32.5 % — ABNORMAL LOW (ref 39.0–52.0)
Hemoglobin: 10.9 g/dL — ABNORMAL LOW (ref 13.0–17.0)
MCH: 32.3 pg (ref 26.0–34.0)
MCHC: 33.5 g/dL (ref 30.0–36.0)
MCV: 96.4 fL (ref 78.0–100.0)
Platelets: 215 10*3/uL (ref 150–400)
RBC: 3.37 MIL/uL — ABNORMAL LOW (ref 4.22–5.81)
RDW: 12.7 % (ref 11.5–15.5)
WBC: 17.1 10*3/uL — ABNORMAL HIGH (ref 4.0–10.5)

## 2011-03-31 LAB — BASIC METABOLIC PANEL
BUN: 15 mg/dL (ref 6–23)
CO2: 31 mEq/L (ref 19–32)
Calcium: 8.1 mg/dL — ABNORMAL LOW (ref 8.4–10.5)
Chloride: 99 mEq/L (ref 96–112)
Creatinine, Ser: 0.54 mg/dL (ref 0.4–1.5)
GFR calc Af Amer: >60 mL/min (ref >60)
GFR calc non Af Amer: >60 mL/min (ref >60)
Glucose, Bld: 100 mg/dL — ABNORMAL HIGH (ref 70–99)
Potassium: 3.5 mEq/L (ref 3.5–5.1)
Sodium: 136 mEq/L (ref 135–145)

## 2011-04-01 ENCOUNTER — Inpatient Hospital Stay (HOSPITAL_COMMUNITY): Payer: Medicare Other

## 2011-04-02 DIAGNOSIS — I4891 Unspecified atrial fibrillation: Secondary | ICD-10-CM

## 2011-04-02 LAB — PROTIME-INR
INR: 1.34 (ref 0.00–1.49)
Prothrombin Time: 16.8 seconds — ABNORMAL HIGH (ref 11.6–15.2)

## 2011-04-03 DIAGNOSIS — I4891 Unspecified atrial fibrillation: Secondary | ICD-10-CM

## 2011-04-03 LAB — PROTIME-INR: INR: 1.24 (ref 0.00–1.49)

## 2011-04-05 ENCOUNTER — Ambulatory Visit (INDEPENDENT_AMBULATORY_CARE_PROVIDER_SITE_OTHER): Payer: Medicare Other | Admitting: Cardiology

## 2011-04-05 ENCOUNTER — Other Ambulatory Visit: Payer: Self-pay | Admitting: Cardiothoracic Surgery

## 2011-04-05 DIAGNOSIS — I482 Chronic atrial fibrillation, unspecified: Secondary | ICD-10-CM | POA: Insufficient documentation

## 2011-04-05 DIAGNOSIS — I2510 Atherosclerotic heart disease of native coronary artery without angina pectoris: Secondary | ICD-10-CM

## 2011-04-05 DIAGNOSIS — I4891 Unspecified atrial fibrillation: Secondary | ICD-10-CM

## 2011-04-05 DIAGNOSIS — Z7901 Long term (current) use of anticoagulants: Secondary | ICD-10-CM | POA: Insufficient documentation

## 2011-04-05 NOTE — Progress Notes (Signed)
Cannot sign

## 2011-04-06 ENCOUNTER — Ambulatory Visit
Admission: RE | Admit: 2011-04-06 | Discharge: 2011-04-06 | Disposition: A | Discharge status: Home / Self Care | Payer: Medicare Other | Source: Ambulatory Visit | Attending: Cardiothoracic Surgery | Admitting: Cardiothoracic Surgery

## 2011-04-06 ENCOUNTER — Ambulatory Visit (INDEPENDENT_AMBULATORY_CARE_PROVIDER_SITE_OTHER): Payer: Self-pay | Admitting: Cardiothoracic Surgery

## 2011-04-06 DIAGNOSIS — I2510 Atherosclerotic heart disease of native coronary artery without angina pectoris: Secondary | ICD-10-CM

## 2011-04-07 NOTE — Assessment & Plan Note (Signed)
OFFICE VISIT  Blake [name], Blake [name] DOB:  09/04/1946                                        April 06, 2011 CHART #:  [id number]  CURRENT PROBLEM: 1. Status post CABG x3, March 27, 2011 for left main stenosis with class     III angina. 2. Postoperative atrial fibrillation, converted to sinus rhythm, on     amiodarone and digoxin. 3. Short-term Coumadin for postoperative atrial fibrillation.  HISTORY OF PRESENT ILLNESS:  The patient is a very nice 65 year old gentleman, who was recently discharged from the hospital about 4 days ago after undergoing multivessel bypass grafting.  His hospitalization was somewhat prolonged by postoperative atrial fibrillation, but was eventually converted to sinus rhythm with amiodarone and digoxin as well as Lopressor.  He is started on Coumadin and his last INR 2 days ago was 2.1.  He is currently on 5 mg a day.  He has had some musculoskeletal pain, but no angina, and the surgical incisions are healing well.  There are no symptoms of CHF.  PHYSICAL EXAMINATION:  His blood pressure is 90/60, pulse 70, respirations 16, saturation 95% on room air.  He appears to be very well.  The incisions look fine.  Breath sounds are clear and equal and cardiac rhythm is regular.  A chest x-ray shows some mild atelectasis with no significant effusion.  PLAN:  The patient requests to be able to go to the beach for a long- held reservation for a vacation here in [place].  I think he is adequately recovered for the trip, but he understands the importance of daily walking and diet control and staying out of the heat.  He will continue Coumadin with a 5, alternating with 2.5 mg schedule, and he will return on July 1 with a follow-up INR.  If he has any bleeding complication, he will contact us over the phone.  He will continue his other medications, which include aspirin 81 mg, amiodarone 200 b.i.d., digoxin 0.25 mg a day, metoprolol 25  b.i.d., Zocor 20 mg a day, and pain medicine.  Kerin Perna, M.D. Electronically Signed  PV/MEDQ  D:  04/06/2011  T:  04/07/2011  Job:  [id number]  cc:   Thomas C. Wall, MD, [facility]

## 2011-04-09 NOTE — Progress Notes (Signed)
I can not sign 

## 2011-04-12 ENCOUNTER — Telehealth: Payer: Self-pay | Admitting: Cardiology

## 2011-04-12 HISTORY — PX: ESOPHAGOGASTRODUODENOSCOPY ENDOSCOPY: SHX5814

## 2011-04-12 NOTE — Telephone Encounter (Signed)
Pt son is calling re question re coumadin. Pt son would like to talk to a nurse.

## 2011-04-13 ENCOUNTER — Ambulatory Visit (INDEPENDENT_AMBULATORY_CARE_PROVIDER_SITE_OTHER): Payer: Self-pay | Admitting: Cardiovascular Disease

## 2011-04-13 ENCOUNTER — Telehealth: Payer: Self-pay | Admitting: Cardiology

## 2011-04-13 ENCOUNTER — Encounter: Payer: Self-pay | Admitting: Cardiology

## 2011-04-13 DIAGNOSIS — R0989 Other specified symptoms and signs involving the circulatory and respiratory systems: Secondary | ICD-10-CM

## 2011-04-13 NOTE — Telephone Encounter (Signed)
Per pt son calling. Pt is out of town. C/o blood in stool.  pt on couamdin.

## 2011-04-13 NOTE — Telephone Encounter (Signed)
Pt's son called back, advised pt is experiencing some melena in stool.  Denies BRB, stool is formed, not tarry.  Pt feels fine overall, but concerned since pt is on Coumadin.  Advised pt's son pt was due for an INR check on 04/09/11 and we have been trying to locate pt.  They are out of town at [place].  Told pt's son my recommendation would be to have Coumadin level checked immediately.  I could not give dosage recommendations to skip, half or stay on the same dosage without knowing an INR level.  Son states he had his father take a Zantac in case acid was playing a part, advised that Zantac was not contraindicated, but the best plan is to check INR immediately and then adjust dosage of Coumadin accordingly.    Called pt's son back to verify his father has only been taking 2.5mg  daily.  LMOM this is the dosage he should currently be on.   Pt's son called back 04/13/11 in am states his father has been alternating 2.5/5mg  QOD.  Advised he should have his Coumadin checked ASAP.  Son is going to take him to [facility] and have lab done.  Faxed order to [phone] per their request.  Order for STAT PT/INR faxed with results to [facility].  Will await results and call with dosage instructions once received.  Per son pt has not experienced any worsening symptoms, and feels well overall this am.

## 2011-04-13 NOTE — Telephone Encounter (Signed)
SPOKE WITH PT'S SON C/O BLACK TARRY STOOL  FOR COUPLE OF DAYS  FEELING FINE UNTIL TODAY  STARTED TO FEEL WEAK  WITH  SOME DIZZINESS NOTED  HAS TAKEN DAD  TO HAVE  LABS DONE  PER SON INR  WAS 1.8 AND  HBG  WAS 8.7  HAS DECIDED TO TAKE DAD  TO LOCAL ER FOR EVALUATION AND TREATMENT  INFORMED WILL FORWARD TO DR WALL FOR REVIEW.

## 2011-04-14 DIAGNOSIS — K922 Gastrointestinal hemorrhage, unspecified: Secondary | ICD-10-CM | POA: Insufficient documentation

## 2011-04-16 NOTE — Op Note (Signed)
NAMEMarland Kitchen  CASTER, Blake NO.:  [name]  MEDICAL RECORD NO.:  [id number]  LOCATION:  2307                         FACILITY:  [facility]  PHYSICIAN:  Kerin Perna, M.D.  DATE OF BIRTH:  1946-06-10  DATE OF PROCEDURE:  03/27/2011 DATE OF DISCHARGE:                              OPERATIVE REPORT   OPERATIONS: 1. Coronary artery bypass grafting x3 (left internal mammary artery to     LAD, saphenous vein graft to first diagonal, saphenous vein graft     to obtuse marginal). 2. Endoscopic harvest of right leg greater saphenous vein.  PREOPERATIVE DIAGNOSES:  Severe multivessel coronary artery disease and left main stenosis.  POSTOPERATIVE DIAGNOSES:  Severe multivessel coronary artery disease and left main stenosis.  SURGEON:  Kerin Perna, MD  ASSISTANT:  Doree Fudge, PA-C  ANESTHESIA:  General by Dr. Kipp Brood.  INDICATIONS:  The patient is a 65 year old obese Caucasian male, nonsmoker, presents with exertional chest pain and cardiac catheterization demonstrating a long heavily calcified proximal LAD stenosis of 75% with a heavily calcified ostium of the left main with a 58% stenosis.  LVEF is preserved.  Percutaneous intervention was not recommended and the patient presented for evaluation for bypass surgery. I reviewed results of cardiac cath with the patient and discussed the indications and benefits of coronary artery bypass grafting for treatment of his severe coronary artery disease.  I Also discussed the alternatives to surgery and the risks involved in the operation including risks of stroke, MI, bleeding, blood transfusion requirement, infection, ventilator dependence, and death.  After reviewing these issues, he demonstrated his understanding and agreed to proceed with the surgery under what I felt was an informed consent.  OPERATIVE FINDINGS:  Good-quality conduit and adequate targets, although the diagonal vessel was small at 1.2  mm.  Normal LV function without myocardial scar and fibrosis.  No blood products were required.  PROCEDURE:  The patient was brought to the operating room and placed on the operating table where general anesthesia was induced.  The chest, abdomen and legs were prepped with Betadine and draped as a sterile field.  A transesophageal 2-D echo probe was placed by the anesthesiologist, which documented normal LV function without significant valvular disease.  A sternal incision was made as the saphenous vein was harvested endoscopically from the right leg.  The left internal mammary artery was harvested as a pedicle graft from its origin at the subclavian vessels.  Heparin was administered and the sternal retractor was placed.  Pursestrings were placed in the ascending aorta and right atrium.  When the vein had been harvested and inspected, the patient was cannulated and placed on cardiopulmonary bypass.  The coronary arteries were identified for grafting.  The ascending aorta had mild atherosclerotic changes.  Cardioplegic catheters were placed for both antegrade and retrograde cold blood cardioplegia and the patient was cooled to 32 degrees systemically.  The mammary artery and vein grafts were prepared for the distal anastomoses and the crossclamp was applied.  An 800 mL of cold blood cardioplegia was delivered in split doses between the antegrade aortic and retrograde coronary sinus catheters.  There was good cardioplegic arrest and septal temperature dropped  less than 14 degrees.  Cardioplegia was delivered every 20 minutes or less while the crossclamp was in place.  The distal coronary anastomoses were then performed.  The first distal anastomosis was to the circumflex marginal.  There was a 1.5-1.7 mm vessel with proximal left main stenosis of 50%.  A reverse saphenous vein was sewn end-to-side with running 7-0 Prolene with good flow through the graft.  The second distal anastomosis  was to the first diagonal branch to the LAD.  This is a 1.2-mm vessel with an ostial 80% stenosis.  A reverse saphenous vein was sewn end-to-side with running 7- 0 Prolene with adequate flow through the graft.  Cardioplegia was redosed.  The third distal anastomosis was to the mid-to-distal third LAD.  There was a heavily calcified proximal with 80% stenosis.  The left IMA pedicle was brought through an opening created in the left lateral pericardium was brought down on the LAD and sewn end-to-side with running 8-0 Prolene.  There was good flow through the anastomosis after briefly releasing the pedicle bulldog on the mammary artery.  The bulldog was reapplied and the pedicle was secured at the epicardium. Cardioplegia was redosed.  While the crossclamp was still in place, two proximal vein anastomoses were performed on the ascending aorta using a 4.5-mm vessel and running 7-0 Prolene.  Prior to tying down the final proximal anastomosis, air was vented from the coronary arteries with a dose of retrograde warm blood cardioplegia.  Cross clamp was then removed.  The heart resumed a spontaneous rhythm.  The bypass grafts were checked and found to be hemostatic with a good flow.  Air was aspirated with a 27-gauge needle from a residual air in the vein grafts.  Temporary pacing wires were applied.  The patient was rewarmed to 37 degrees.  The lungs were then re-expanded.  The ventilator was resumed.  The patient was weaned from bypass without difficulty and inotropic support was not required.  Cardiac output was normal and the 2-D echo showed global LV function to be well-preserved.  Protamine was administered without adverse reaction and the cannulas were all removed.  The mediastinum was irrigated.  The leg incision was closed in a standard fashion.  The superior pericardial fat was closed over the aorta.  Two mediastinal and left pleural chest tube were placed and brought out through  separate incisions.  The sternum was closed with interrupted steel wire. Pectoralis fascia and subcutaneous layers were closed in running Vicryl and sterile dressings were applied.  Total cardiopulmonary bypass time was 98 minutes.     Kerin Perna, M.D.     PV/MEDQ  D:  03/27/2011  T:  03/28/2011  Job:  [id number]  cc:   Arturo Morton. Riley Kill, MD, [facility]  Electronically Signed by Kerin Perna M.D. on 04/16/2011 03:06:21 PM

## 2011-04-16 NOTE — Discharge Summary (Signed)
NAME:  Blake [name], Blake [name] NO.:  [id number]  MEDICAL RECORD NO.:  [id number]  LOCATION:                                 FACILITY:  PHYSICIAN:  Kerin Perna, M.D.  DATE OF BIRTH:  02/02/1946  DATE OF ADMISSION: DATE OF DISCHARGE:                              DISCHARGE SUMMARY   FINAL DIAGNOSIS:  Severe multivessel coronary artery disease and left main stenosis.  IN-HOSPITAL DIAGNOSES: 1. Postoperative atrial fibrillation. 2. Volume overload postoperatively. 3. Acute blood loss anemia postoperatively.  SECONDARY DIAGNOSES: 1. Benign prostatic hypertrophy. 2. Obesity. 3. Reformed smoker.  IN-HOSPITAL OPERATIONS AND PROCEDURES: 1. Intraoperative transesophageal echocardiogram done by Dr. Noreene Larsson on     March 27, 2011. 2. Coronary artery bypass grafting x3 using a left internal mammary     artery to left anterior descending, saphenous vein graft to first     diagonal, saphenous vein graft to obtuse marginal.  Endoscopic     saphenous vein harvest of the right leg was done.  This was done by     Dr. Donata Clay on March 27, 2011.  HISTORY AND PHYSICAL AND HOSPITAL COURSE:  The patient is a 65 year old obese Caucasian male, nonsmoker, who presents with exertional chest pain and cardiac catheterization demonstrating a long heavily calcified proximal LAD stenosis 75% with a heavily calcified ostium of the left main with 58% stenosis.  His LVEF is preserved.  The patient was referred to Dr. Donata Clay for further discussion of coronary artery bypass grafting.  Dr. Donata Clay saw and evaluated the patient.  He discussed with the patient undergoing coronary bypass grafting.  He discussed risks and benefits with the patient.  The patient acknowledged understanding and agreed to proceed.  Surgery was scheduled for March 27, 2011.  For further details of the patient's past medical history and physical exam, please see dictated H and P.  The patient was taken to the operating  room on March 27, 2011, where he underwent coronary artery bypass grafting x3 using a left internal mammary artery graft to left anterior descending, saphenous vein graft to first diagonal, saphenous vein graft to obtuse marginal.  Endoscopic vein harvesting of the right leg was done.  The patient tolerated this procedure, was transferred to the intensive care unit in stable condition.  Postoperatively, the patient was noted to be hemodynamically stable.  He was able to be extubated on the evening of surgery. Postextubation, the patient was noted to be alert and oriented x4. Neuro intact.  Postop day #1, the patient was found to be in normal sinus rhythm.  Blood pressure is stable.  All drips were able to be weaned and discontinued.  Chest x-ray obtained on postop day 1 was stable.  The patient had minimal drainage from chest tubes and chest tubes were discontinued in routine fashion.  The patient was started on daily Lasix for volume overload.  He did have some mild acute blood loss anemia with a hemoglobin of 10.6.  The patient was felt to be stable and ready for transfer out to [facility] on postop day 1.  On the telemetry floor, the patient did continue to progress well.  Early morning of  postop day 2, the patient went into rapid atrial fibrillation with heart rate in the 173.  He was started on IV amiodarone.  The patient was able to convert back to normal sinus rhythm with the initiation of amiodarone. Blood pressure was noted to be tolerated well.  He was continued on low- dose beta-blocker.  The patient has remained in normal sinus rhythm and was switched over to p.o. amiodarone today, March 30, 2011.  We will continue to monitor heart rate and blood pressure.  Followup chest x-ray on postop day 2 remained stable, some mild atelectasis.  The patient was encouraged to use his incentive spirometer.  He has been able to be weaned off oxygen with O2 saturations maintaining greater than 90%  on room air.  The patient's hemoglobin/hematocrit remained stable.  He did not require any blood transfusions postoperatively.  Daily weights were followed closely and volume overload was improving.  He is continued on daily Lasix.  The patient did develop leukocytosis with a white blood cell count increasing to 24.3.  He was continued on his Zinacef secondary to increase in his white blood cell count.  Urinalysis was checked, and this was noted to be negative.  The patient had no superficial signs of wound infection.  White blood cell count was rechecked and was back down to 20 by postop day 3.  We will continue to monitor this and continue IV Zinacef for now.  Postoperatively, the patient was up ambulating well without difficulty.  He was tolerating diet well.  No nausea, vomiting noted.  All incisions are clean, dry, and intact and healing well.  On postop day 3, March 30, 2011, the patient is afebrile, normal sinus rhythm, blood pressure is stable.  O2 sats greater than 90% on room air. Most recent lab work shows sodium of 136, potassium 3.6, chloride 101, bicarbonate 27, BUN of 14, creatinine 0.62, glucose 128.  White blood cell count 20.0, hemoglobin of 11.3, hematocrit 33.1, platelet count 178.  The patient is tentatively ready for discharge to home in the next 24-48 hours pending he remains stable.  FOLLOWUP APPOINTMENTS:  Followup appointment has been arranged with Dr. Donata Clay for April 23, 2011, at 1:30 p.m.  The patient will need to obtain PA and lateral chest x-ray 45 minutes prior to this appointment. He will need to follow up with Dr. Riley Kill in 2 weeks.  He will need to contact his office to make these arrangements.  ACTIVITY:  The patient is instructed no driving until released to do so, no lifting over 10 pounds.  He was told to ambulate 3-4 times per day, progress as tolerated, and continue his breathing exercises.  INCISIONAL CARE:  The patient is told to shower  washing his incisions using soap and water.  He is to contact the office if he develops any drainage or opening from any of his incision sites.  DIET:  The patient is educated on diet to be low fat, low salt.  DISCHARGE MEDICATIONS: 1. Amiodarone 400 mg b.i.d. x14 days, then 200 mg b.i.d. 2. Lasix 40 mg daily x5 days. 3. Lopressor 12.5 mg b.i.d. 4. Oxycodone 5 mg 1-2 tablets q.3 h. p.r.n. pain. 5. Potassium chloride 20 mEq daily x5 days. 6. Zocor 20 mg daily. 7. Aspirin enteric coated 325 mg daily.     Sol Blazing, PA   ______________________________ Kerin Perna, M.D.    KMD/MEDQ  D:  03/30/2011  T:  03/31/2011  Job:  [id number]  cc:   Arturo Morton. Riley Kill, MD, [facility]  Electronically Signed by Cameron Proud PA on 04/04/2011 08:59:55 AM Electronically Signed by Kerin Perna M.D. on 04/16/2011 03:06:31 PM

## 2011-04-16 NOTE — Discharge Summary (Signed)
NAME:  Blake [name], Blake [name] NO.:  [id number]  MEDICAL RECORD NO.:  [id number]  LOCATION:  2018                         FACILITY:  [facility]  PHYSICIAN:  Kerin Perna, M.D.  DATE OF BIRTH:  02/13/1946  DATE OF ADMISSION:  03/27/2011 DATE OF DISCHARGE:                              DISCHARGE SUMMARY   ADDENDUM  BRIEF HOSPITAL COURSE STAY:  The patient had T-max of 99.7, but later became afebrile.  He was still experiencing paroxysmal atrial fibrillation.  He also had bigeminy, trigeminy, and PVCs on postop day #4.  As a result, he was given amiodarone 400 mg p.o. 3 times daily on April 01, 2011.  He was then continued on amiodarone 400 mg p.o. 2 times daily.  His Lopressor was increased from 12.5 mg p.o. 2 times daily to 25 mg p.o. 2 times daily and he was started on Coumadin.  His PT and INR are monitored daily.  He did have previous constipation, but then had a bowel movement.  He actually then had loose stools as a result, his Dulcolax and Colace were stopped.  C. diff was obtained and results indicated that it is negative.  He continued to progress with cardiac rehab and was ambulating actually fairly well on his own.  His epicardial pacing wires and chest tube sutures were removed without difficulty.  Currently, on postop day #6, he had T-max 99.3 and slowly became afebrile, heart rate in 80s, BP 95/53, most recent blood pressure is 110/48, O2 sat 97% room air.  Preoperative weight 84 kg, today his weight down to 82.3 kg.  PHYSICAL EXAMINATION:  CARDIOVASCULAR:  Regular rate and rhythm. PULMONARY:  Clear. ABDOMEN:  Soft, nontender.  Bowel sounds present. EXTREMITIES:  Trace lower extremity edema.  Sternal and lower extremity wounds are clean, dry and continuing to heal.  The patient is going to be seen and evaluated by Dr. Donata Clay today to determine what the timing of the patient's discharge.  LATEST LABORATORY STUDIES:  Are as follows:  C. diff as  previously stated negative.  PT/INR 60.8 and 1.34.  Last BMET done on March 31, 2011, potassium 3.5 which has been supplemented, sodium 136, BUN and creatinine 15 and 0.54 respectively.  CBC done on this date H and H 10.9 and 32.5, white count down to 17,100, platelet count 215,000.  Urine culture final no growth.  Last chest x-ray done on April 01, 2011, showed improving left pleural effusion and airspace disease, small right pleural effusion, no pneumothorax.  ADDITIONAL FOLLOWUPS:  Include the patient is to have a PT and INR drawn 48 hours after his discharge from [facility] and he is to contact Dr. Rosalyn Charters office for this follow up date and time.  DISCHARGE MEDICATIONS:  At the time of this dictation include the following: 1. Amiodarone 400 mg p.o. 2 times daily for 10 days, then amiodarone     200 mg p.o. 2  times daily thereafter. 2. Lopressor 25 mg p.o. 2 times daily. 3. Oxycodone 5 mg 1-2 tablets p.o. q.3-4 h p.r.n. pain. 4. Simvastatin 20 mg p.o. nightly. 5. Coumadin 5 mg p.o. daily or as directed by Dr. Rosalyn Charters office. 6. Enteric-coated  aspirin 81 mg p.o. daily.     Doree Fudge, PA   ______________________________ Kerin Perna, M.D.    DZ/MEDQ  D:  04/02/2011  T:  04/03/2011  Job:  [id number]  cc:   Arturo Morton. Riley Kill, MD, [facility]  Electronically Signed by Doree Fudge PA on 04/10/2011 12:33:26 PM Electronically Signed by Kerin Perna M.D. on 04/16/2011 03:06:42 PM

## 2011-04-23 ENCOUNTER — Ambulatory Visit: Payer: Self-pay | Admitting: Cardiothoracic Surgery

## 2011-04-23 ENCOUNTER — Encounter: Payer: Medicare Other | Admitting: Cardiothoracic Surgery

## 2011-04-26 ENCOUNTER — Other Ambulatory Visit: Payer: Self-pay | Admitting: Cardiothoracic Surgery

## 2011-04-26 DIAGNOSIS — I2510 Atherosclerotic heart disease of native coronary artery without angina pectoris: Secondary | ICD-10-CM

## 2011-04-27 ENCOUNTER — Ambulatory Visit: Payer: Self-pay | Admitting: Cardiothoracic Surgery

## 2011-04-27 ENCOUNTER — Ambulatory Visit
Admission: RE | Admit: 2011-04-27 | Discharge: 2011-04-27 | Disposition: A | Discharge status: Home / Self Care | Payer: Medicare Other | Source: Ambulatory Visit | Attending: Cardiothoracic Surgery | Admitting: Cardiothoracic Surgery

## 2011-04-27 ENCOUNTER — Ambulatory Visit (INDEPENDENT_AMBULATORY_CARE_PROVIDER_SITE_OTHER): Payer: Self-pay | Admitting: Cardiothoracic Surgery

## 2011-04-27 DIAGNOSIS — I2510 Atherosclerotic heart disease of native coronary artery without angina pectoris: Secondary | ICD-10-CM

## 2011-04-27 NOTE — Assessment & Plan Note (Signed)
OFFICE VISIT  Blake [name], Blake [name] DOB:  August 23, 1946                                        April 27, 2011 CHART #:  [id number]  HISTORY:  The patient is a 65 year old white male who underwent coronary artery bypass grafting x3 on March 27, 2011, for severe multivessel coronary artery disease and left main stenosis.  Postoperatively, he did have atrial fibrillation that was converted to normal sinus rhythm on amiodarone and digoxin.  He was started on Coumadin as well for this. [place] for severe anemia with endoscopically proven gastric ulcers.  He was transfused 5 units of blood in totality.  His Coumadin was discontinued and he is on a baby aspirin daily.  He states that he had an episode of atrial fibrillation during the endoscopic procedure but at no other time and this was paroxysmal.  Currently, he reports that he is feeling much better.  He denies shortness of breath, chest pain, or abdominal pain. He is on iron and does report that his stools are dark.  He denies dizziness or lightheadedness.  He feels as though his activity progression at this time is fairly good.  He does have some insomnia. He does have some urinary frequency with a known history of benign prostatic hyperplasia.  Chest x-ray was obtained on today's date.  It reveals normal heart size with no active cardiopulmonary disease.  PHYSICAL EXAMINATION:  VITAL SIGNS:  Blood pressure 106/66, pulse is 60 and regular, respirations 18, and oxygen saturation is 98% on room air. GENERAL APPEARANCE:  A well-developed adult male in no acute distress. PULMONARY:  Clear lung fields.  CARDIAC:  Regular rate and rhythm. Normal S1 and S2.  No murmurs, gallops, or rubs.  ABDOMEN:  Soft and nontender.  EXTREMITIES:  No edema.  Incisions healing well without evidence of infection.  ASSESSMENT:  The patient is doing well.  He appears to be very stable  in regard to his gastric ulcer.  We will continue low-dose aspirin at this time.  He is also on Prilosec and iron.  His amiodarone at this point is 200 mg p.o. b.i.d.  Hopefully, this can be discontinued in the next several weeks.  In regards to his surgical recovery, we have encouraged him to increase his ambulation per protocol.  Additionally, I instructed him on protocols for advancement in driving.  I also discussed lifting restrictions.  In regards to his urinary frequency, he reports that he stopped taking oxybutynin on his own approximately a year and half ago. I instructed him to follow up with his urologist in this regard as there may be a better medication for managing this.  We will see him again in approximately 1 month for further postsurgical recovery followup.  Rowe Clack, P.A.-C.  Sherryll Burger  D:  04/27/2011  T:  04/27/2011  Job:  [id number]  cc:   Arturo Morton. Riley Kill, MD, [facility]

## 2011-05-03 ENCOUNTER — Ambulatory Visit (INDEPENDENT_AMBULATORY_CARE_PROVIDER_SITE_OTHER): Payer: Medicare Other | Admitting: Cardiology

## 2011-05-03 ENCOUNTER — Encounter: Payer: Self-pay | Admitting: *Deleted

## 2011-05-03 ENCOUNTER — Encounter: Payer: Self-pay | Admitting: Cardiology

## 2011-05-03 VITALS — BP 112/60 | HR 80 | Resp 14 | Ht 66.0 in | Wt 180.0 lb

## 2011-05-03 DIAGNOSIS — I4891 Unspecified atrial fibrillation: Secondary | ICD-10-CM

## 2011-05-03 DIAGNOSIS — I2510 Atherosclerotic heart disease of native coronary artery without angina pectoris: Secondary | ICD-10-CM

## 2011-05-03 MED ORDER — DIGOXIN 250 MCG PO TABS: 250.0000 ug | ORAL_TABLET | Freq: Every day | ORAL | Status: DC

## 2011-05-03 MED ORDER — AMIODARONE HCL 200 MG PO TABS: 400.0000 mg | ORAL_TABLET | Freq: Every day | ORAL | Status: DC

## 2011-05-03 MED ORDER — METOPROLOL TARTRATE 25 MG PO TABS: 25.0000 mg | ORAL_TABLET | Freq: Two times a day (BID) | ORAL | Status: DC

## 2011-05-03 NOTE — Patient Instructions (Signed)
Your physician has recommended you make the following change in your medication: Pacerone   Your physician recommends that you schedule a follow-up appointment in: August 7,2012 with Dr. Daleen Squibb

## 2011-05-03 NOTE — Progress Notes (Signed)
Addended by: Mylo Red F on: 05/03/2011 04:32 PM   Modules accepted: Orders

## 2011-05-03 NOTE — Assessment & Plan Note (Signed)
He is asymptomatic but obviously going in and out of atrial fib. I do not feel safe in starting Coumadin and will continue with the low dose aspirin. He also does not want to take Coumadin.  I will have him take his amiodarone 400 mg once a day. He remained on digoxin and metoprolol. I'll have him return to see me in 4 weeks. If he is in normal sinus rhythm then we will discontinue her both of these medications. He may need to have his metoprolol increased at that time.

## 2011-05-03 NOTE — Progress Notes (Signed)
HPI Blake [name] returns today post coronary artery bypass grafting for severe three-vessel disease.  When I initially saw in the office, he had classic exertional angina. Catheterization demonstrated severe 3 vessel disease with good LV function. He underwent coronary bypass grafting on June 5. He had postoperative atrial fib was sent home on Coumadin.  [place]. His hemoglobin was low and in a having 5 units of blood transfused. He had endoscopy and had 2 areas that were bleeding and were clipped. He was sent on enteric-coated aspirin 81 mg a day.  He's felt well since that time.  He denies exertional angina or dyspnea on exertion. He  Has mild incisional pain.  [place] he was told he was in normal rhythm most of time except for one brief episode after endoscopy.  Today, his initial EKG showed atrial fib. I checked his pulse and it was regular. We did a rhythm strip which showed normal sinus rhythm. There were no ST segment changes.  When his heart rate goes fast as it didn't hospital he felt some pounding. Otherwise, he's been asymptomatic. Past Medical History  Diagnosis Date  . Elevated PSA   . Hyperlipidemia   . HYPERLIPIDEMIA 05/25/2009  . BINGE EATING DISORDER 05/25/2009  . Chest pain 03/05/2011    Past Surgical History  Procedure Date  . Polypectomy     x 2    Family History  Problem Relation Age of Onset  . Coronary artery disease Father   . Heart attack Father 19  . Lymphoma    . Diabetes Sister   . Heart attack Sister 56  . Ovarian cancer Sister   . Atrial fibrillation Brother     History   Social History  . Marital Status: Married    Spouse Name: N/A    Number of Children: 4  . Years of Education: N/A   Occupational History  . retired     Social History Main Topics  . Smoking status: Former Smoker    Quit date: 03/04/1988  . Smokeless tobacco: Not on file  . Alcohol Use: Yes     socially  . Drug  Use: Not on file  . Sexually Active: Not on file   Other Topics Concern  . Not on file   Social History Narrative   Regular exercise- yes     No Known Allergies  Current Outpatient Prescriptions  Medication Sig Dispense Refill  . amiodarone (PACERONE) 200 MG tablet Take 200 mg by mouth 2 (two) times daily.        [name] aspirin 81 MG tablet Take 81 mg by mouth daily.        . digoxin (LANOXIN) 0.25 MG tablet Take 250 mcg by mouth daily.        . ferrous sulfate 325 (65 FE) MG tablet Take 325 mg by mouth daily with breakfast.        . metoprolol tartrate (LOPRESSOR) 25 MG tablet Take 25 mg by mouth 2 (two) times daily.        [name] omeprazole (PRILOSEC) 40 MG capsule Take 40 mg by mouth daily.        . simvastatin (ZOCOR) 20 MG tablet Take 20 mg by mouth at bedtime.          ROS Negative other than HPI.   PE General Appearance: well developed, well nourished in no acute distress HEENT: symmetrical face, PERRLA, good dentition  Neck: no JVD,  thyromegaly, or adenopathy, trachea midline Chest: symmetric without deformity, incision stable and healing well. Cardiac: PMI non-displaced, RRR, normal S1, S2, no gallop or murmur Lung: clear to ausculation and percussion Vascular: all pulses full without bruits  Abdominal: nondistended, nontender, good bowel sounds, no HSM, no bruits Extremities: no cyanosis, clubbing or edema, no sign of DVT, no varicosities  Skin: normal color, no rashes Neuro: alert and oriented x 3, non-focal Pysch: normal affect Filed Vitals:   05/03/11 1536  BP: 112/60  Pulse: 80  Resp: 14  Height: 5\' 6"  (1.676 m)  Weight: 180 lb (81.647 kg)    EKG  Labs and Studies Reviewed.   Lab Results  Component Value Date   WBC 17.1* 03/31/2011   HGB 10.9* 03/31/2011   HCT 32.5* 03/31/2011   MCV 96.4 03/31/2011   PLT 215 03/31/2011      Chemistry      Component Value Date/Time   NA 136 03/31/2011 0330   K 3.5 03/31/2011 0330   CL 99 03/31/2011 0330   CO2 31 03/31/2011 0330    BUN 15 03/31/2011 0330   CREATININE 0.54 03/31/2011 0330      Component Value Date/Time   CALCIUM 8.1* 03/31/2011 0330   ALKPHOS 83 03/23/2011 1127   AST 16 03/23/2011 1127   ALT 14 03/23/2011 1127   BILITOT 0.6 03/23/2011 1127       Lab Results  Component Value Date   CHOL 194 09/20/2009   Lab Results  Component Value Date   HDL 42.00 09/20/2009   Lab Results  Component Value Date   LDLCALC 135* 09/20/2009   Lab Results  Component Value Date   TRIG 85.0 09/20/2009   Lab Results  Component Value Date   CHOLHDL 5 09/20/2009   Lab Results  Component Value Date   HGBA1C  Value: 5.5 (NOTE)                                                                       According to the ADA Clinical Practice Recommendations for 2011, when HbA1c is used as a screening test:   >=6.5%   Diagnostic of Diabetes Mellitus           (if abnormal result  is confirmed)  5.7-6.4%   Increased risk of developing Diabetes Mellitus  References:Diagnosis and Classification of Diabetes Mellitus,Diabetes Care,2011,34(Suppl 1):S62-S69 and Standards of Medical Care in         Diabetes - 2011,Diabetes Care,2011,34  (Suppl 1):S11-S61. 03/23/2011   Lab Results  Component Value Date   ALT 14 03/23/2011   AST 16 03/23/2011   ALKPHOS 83 03/23/2011   BILITOT 0.6 03/23/2011   Lab Results  Component Value Date   TSH 0.60 12/20/2008

## 2011-05-03 NOTE — Assessment & Plan Note (Signed)
He is status post bypass surgery. He is having no further exertional angina.

## 2011-05-10 ENCOUNTER — Telehealth: Payer: Self-pay | Admitting: Cardiology

## 2011-05-10 NOTE — Telephone Encounter (Signed)
Faxed LOV & EKG to [facility] ([phone]). °

## 2011-05-14 ENCOUNTER — Encounter (HOSPITAL_COMMUNITY)
Admission: RE | Admit: 2011-05-14 | Discharge: 2011-05-14 | Disposition: A | Discharge status: Home / Self Care | Payer: Medicare Other | Source: Ambulatory Visit | Attending: Cardiology | Admitting: Cardiology

## 2011-05-14 DIAGNOSIS — Z951 Presence of aortocoronary bypass graft: Secondary | ICD-10-CM | POA: Insufficient documentation

## 2011-05-14 DIAGNOSIS — I2510 Atherosclerotic heart disease of native coronary artery without angina pectoris: Secondary | ICD-10-CM | POA: Insufficient documentation

## 2011-05-14 DIAGNOSIS — Z87891 Personal history of nicotine dependence: Secondary | ICD-10-CM | POA: Insufficient documentation

## 2011-05-14 DIAGNOSIS — Z5189 Encounter for other specified aftercare: Secondary | ICD-10-CM | POA: Insufficient documentation

## 2011-05-14 DIAGNOSIS — I4891 Unspecified atrial fibrillation: Secondary | ICD-10-CM | POA: Insufficient documentation

## 2011-05-14 DIAGNOSIS — E669 Obesity, unspecified: Secondary | ICD-10-CM | POA: Insufficient documentation

## 2011-05-14 DIAGNOSIS — Z7982 Long term (current) use of aspirin: Secondary | ICD-10-CM | POA: Insufficient documentation

## 2011-05-15 ENCOUNTER — Telehealth: Payer: Self-pay | Admitting: Cardiology

## 2011-05-15 MED ORDER — OMEPRAZOLE 40 MG PO CPDR: 40.0000 mg | DELAYED_RELEASE_CAPSULE | Freq: Every day | ORAL | Status: DC

## 2011-05-15 NOTE — Telephone Encounter (Signed)
Pt aware refill on prilosec sent to [address]. Mylo Red RN

## 2011-05-15 NOTE — Telephone Encounter (Signed)
Pt needs prilosec 40mg  qd called into [facility] [address] this was given by the surgeon but he needs a Rx called in now and please call pt and let him know when this is called in

## 2011-05-16 ENCOUNTER — Encounter (HOSPITAL_COMMUNITY): Payer: Medicare Other

## 2011-05-18 ENCOUNTER — Encounter (HOSPITAL_COMMUNITY): Payer: Medicare Other

## 2011-05-21 ENCOUNTER — Encounter (HOSPITAL_COMMUNITY): Payer: Medicare Other

## 2011-05-23 ENCOUNTER — Encounter (HOSPITAL_COMMUNITY): Payer: Medicare Other | Attending: Cardiology

## 2011-05-23 DIAGNOSIS — I4891 Unspecified atrial fibrillation: Secondary | ICD-10-CM | POA: Insufficient documentation

## 2011-05-23 DIAGNOSIS — E669 Obesity, unspecified: Secondary | ICD-10-CM | POA: Insufficient documentation

## 2011-05-23 DIAGNOSIS — Z951 Presence of aortocoronary bypass graft: Secondary | ICD-10-CM | POA: Insufficient documentation

## 2011-05-23 DIAGNOSIS — Z5189 Encounter for other specified aftercare: Secondary | ICD-10-CM | POA: Insufficient documentation

## 2011-05-23 DIAGNOSIS — Z87891 Personal history of nicotine dependence: Secondary | ICD-10-CM | POA: Insufficient documentation

## 2011-05-23 DIAGNOSIS — Z7982 Long term (current) use of aspirin: Secondary | ICD-10-CM | POA: Insufficient documentation

## 2011-05-23 DIAGNOSIS — I2510 Atherosclerotic heart disease of native coronary artery without angina pectoris: Secondary | ICD-10-CM | POA: Insufficient documentation

## 2011-05-25 ENCOUNTER — Encounter (HOSPITAL_COMMUNITY): Payer: Medicare Other

## 2011-05-28 ENCOUNTER — Encounter (HOSPITAL_COMMUNITY): Payer: Medicare Other

## 2011-05-29 ENCOUNTER — Ambulatory Visit (INDEPENDENT_AMBULATORY_CARE_PROVIDER_SITE_OTHER): Payer: Medicare Other | Admitting: Cardiology

## 2011-05-29 VITALS — BP 112/58 | HR 56 | Ht 66.0 in | Wt 176.0 lb

## 2011-05-29 DIAGNOSIS — Z7901 Long term (current) use of anticoagulants: Secondary | ICD-10-CM

## 2011-05-29 DIAGNOSIS — I4891 Unspecified atrial fibrillation: Secondary | ICD-10-CM

## 2011-05-29 DIAGNOSIS — I2510 Atherosclerotic heart disease of native coronary artery without angina pectoris: Secondary | ICD-10-CM

## 2011-05-29 NOTE — Patient Instructions (Signed)
Your physician has recommended you make the following change in your medication:   STOP Amiodarone, digoxin, iron   Your physician recommends that you schedule a follow-up appointment in: 3 months with Dr. Daleen Squibb

## 2011-05-29 NOTE — Assessment & Plan Note (Signed)
Resolved postop. We'll discontinue amiodarone and digoxin. Patient understands the risk comes back. Will stay on metoprolol.

## 2011-05-29 NOTE — Assessment & Plan Note (Signed)
Stable  Follow up in 3months.

## 2011-05-29 NOTE — Progress Notes (Signed)
HPI Mr. Dancel follows up closely with [place] today or his history of postoperative atrial fibrillation. His had no palpitations. He's had some phantom chest pain otherwise asymptomatic.  He would like to come off with her with medications. Very nicely now on medication. He is still on  Prilosec which he would like to stop as well. Advised to call the gastroenterologist in [place].  EKG shows sinus bradycardia nonspecific changes. Past Medical History  Diagnosis Date  . Elevated PSA   . Hyperlipidemia   . HYPERLIPIDEMIA 05/25/2009  . BINGE EATING DISORDER 05/25/2009  . Chest pain 03/05/2011    Past Surgical History  Procedure Date  . Polypectomy     x 2    Family History  Problem Relation Age of Onset  . Coronary artery disease Father   . Heart attack Father 21  . Lymphoma    . Diabetes Sister   . Heart attack Sister 73  . Ovarian cancer Sister   . Atrial fibrillation Brother     History   Social History  . Marital Status: Married    Spouse Name: N/A    Number of Children: 4  . Years of Education: N/A   Occupational History  . retired     Social History Main Topics  . Smoking status: Former Smoker    Quit date: 03/04/1988  . Smokeless tobacco: Not on file  . Alcohol Use: Yes     socially  . Drug Use: Not on file  . Sexually Active: Not on file   Other Topics Concern  . Not on file   Social History Narrative   Regular exercise- yes     No Known Allergies  Current Outpatient Prescriptions  Medication Sig Dispense Refill  . aspirin 81 MG tablet Take 81 mg by mouth daily.        . metoprolol tartrate (LOPRESSOR) 25 MG tablet Take 1 tablet (25 mg total) by mouth 2 (two) times daily.  60 tablet  6  . omeprazole (PRILOSEC) 40 MG capsule Take 1 capsule (40 mg total) by mouth daily.  30 capsule  6  . simvastatin (ZOCOR) 20 MG tablet Take 20 mg by mouth at bedtime.          ROS Negative other than HPI.   PE General Appearance: well developed, well nourished in  no acute distress HEENT: symmetrical face, PERRLA, good dentition  Neck: no JVD, thyromegaly, or adenopathy, trachea midline Chest: symmetric without deformity Cardiac: PMI non-displaced, RRR, normal S1, S2, no gallop or murmur Lung: clear to ausculation and percussion Vascular: all pulses full without bruits  Abdominal: nondistended, nontender, good bowel sounds, no HSM, no bruits Extremities: no cyanosis, clubbing or edema, no sign of DVT, no varicosities  Skin: normal color, no rashes Neuro: alert and oriented x 3, non-focal Pysch: normal affect Filed Vitals:   05/29/11 1054  BP: 112/58  Pulse: 56  Height: 5\' 6"  (1.676 m)  Weight: 176 lb (79.833 kg)    EKG  Labs and Studies Reviewed.   Lab Results  Component Value Date   WBC 17.1* 03/31/2011   HGB 10.9* 03/31/2011   HCT 32.5* 03/31/2011   MCV 96.4 03/31/2011   PLT 215 03/31/2011      Chemistry      Component Value Date/Time   NA 136 03/31/2011 0330   K 3.5 03/31/2011 0330   CL 99 03/31/2011 0330   CO2 31 03/31/2011 0330   BUN 15 03/31/2011 0330   CREATININE 0.54 03/31/2011  0330      Component Value Date/Time   CALCIUM 8.1* 03/31/2011 0330   ALKPHOS 83 03/23/2011 1127   AST 16 03/23/2011 1127   ALT 14 03/23/2011 1127   BILITOT 0.6 03/23/2011 1127       Lab Results  Component Value Date   CHOL 194 09/20/2009   Lab Results  Component Value Date   HDL 42.00 09/20/2009   Lab Results  Component Value Date   LDLCALC 135* 09/20/2009   Lab Results  Component Value Date   TRIG 85.0 09/20/2009   Lab Results  Component Value Date   CHOLHDL 5 09/20/2009   Lab Results  Component Value Date   HGBA1C  Value: 5.5 (NOTE)                                                                       According to the ADA Clinical Practice Recommendations for 2011, when HbA1c is used as a screening test:   >=6.5%   Diagnostic of Diabetes Mellitus           (if abnormal result  is confirmed)  5.7-6.4%   Increased risk of developing Diabetes Mellitus   References:Diagnosis and Classification of Diabetes Mellitus,Diabetes Care,2011,34(Suppl 1):S62-S69 and Standards of Medical Care in         Diabetes - 2011,Diabetes Care,2011,34  (Suppl 1):S11-S61. 03/23/2011   Lab Results  Component Value Date   ALT 14 03/23/2011   AST 16 03/23/2011   ALKPHOS 83 03/23/2011   BILITOT 0.6 03/23/2011   Lab Results  Component Value Date   TSH 0.60 12/20/2008

## 2011-05-30 ENCOUNTER — Encounter (HOSPITAL_COMMUNITY): Payer: Medicare Other

## 2011-06-01 ENCOUNTER — Telehealth: Payer: Self-pay | Admitting: Cardiology

## 2011-06-01 ENCOUNTER — Encounter (HOSPITAL_COMMUNITY): Payer: Medicare Other

## 2011-06-01 DIAGNOSIS — E7800 Pure hypercholesterolemia, unspecified: Secondary | ICD-10-CM

## 2011-06-01 MED ORDER — SIMVASTATIN 20 MG PO TABS: 20.0000 mg | ORAL_TABLET | Freq: Every day | ORAL | Status: DC

## 2011-06-01 NOTE — Telephone Encounter (Signed)
RX sent to [facility] on [address]. Patient aware.

## 2011-06-01 NOTE — Telephone Encounter (Signed)
Pt needs simvastatin 20mg  qd called into [facility] on [address] and it needs autherization and they asked him to call because we have not responded. Please call pt when this is done so he can go pick it up.

## 2011-06-04 ENCOUNTER — Encounter (HOSPITAL_COMMUNITY): Payer: Medicare Other

## 2011-06-06 ENCOUNTER — Encounter (HOSPITAL_COMMUNITY): Payer: Medicare Other

## 2011-06-08 ENCOUNTER — Encounter (HOSPITAL_COMMUNITY): Payer: Medicare Other

## 2011-06-08 DIAGNOSIS — I2510 Atherosclerotic heart disease of native coronary artery without angina pectoris: Secondary | ICD-10-CM

## 2011-06-08 DIAGNOSIS — N400 Enlarged prostate without lower urinary tract symptoms: Secondary | ICD-10-CM

## 2011-06-08 DIAGNOSIS — K259 Gastric ulcer, unspecified as acute or chronic, without hemorrhage or perforation: Secondary | ICD-10-CM | POA: Insufficient documentation

## 2011-06-08 DIAGNOSIS — E669 Obesity, unspecified: Secondary | ICD-10-CM

## 2011-06-11 ENCOUNTER — Encounter (HOSPITAL_COMMUNITY): Payer: Medicare Other

## 2011-06-13 ENCOUNTER — Ambulatory Visit (INDEPENDENT_AMBULATORY_CARE_PROVIDER_SITE_OTHER): Payer: Self-pay | Admitting: Cardiothoracic Surgery

## 2011-06-13 ENCOUNTER — Encounter (HOSPITAL_COMMUNITY): Payer: Medicare Other

## 2011-06-13 ENCOUNTER — Encounter: Payer: Self-pay | Admitting: Cardiothoracic Surgery

## 2011-06-13 ENCOUNTER — Encounter: Payer: Medicare Other | Admitting: Cardiothoracic Surgery

## 2011-06-13 VITALS — BP 94/56 | HR 62 | Resp 18

## 2011-06-13 DIAGNOSIS — I2510 Atherosclerotic heart disease of native coronary artery without angina pectoris: Secondary | ICD-10-CM | POA: Insufficient documentation

## 2011-06-13 DIAGNOSIS — I4891 Unspecified atrial fibrillation: Secondary | ICD-10-CM

## 2011-06-13 DIAGNOSIS — Z951 Presence of aortocoronary bypass graft: Secondary | ICD-10-CM

## 2011-06-13 NOTE — Progress Notes (Signed)
The patient returns for a 10 week followup after multivessel bypass grafting for a left main disease and unstable angina. The patient is doing well in cardiac rehabilitation what recurrent angina and surgical incisions have all healed nicely. He did develop a postoperative gastric ulcer treated at an outside hospital in that his clinically resolved on oral Prilosec therapy. He has no symptoms of GI bleeding or anemia.  Physical exam Vital signs blood pressure 94/56 pulse 62 regular saturation remained 98% weight 80 kg Gen. appearance is of a middle-aged Caucasian male in no acute no acute distress Chest is clear breath sounds stable sternum via surgical incision is completely healed. Cardiac exam reveals a regular rhythm without S3 gallop rub or murmur. The leg incision is well-healed there is no pedal edema.  Depression The patient is unwell now 10 weeks following surgery. He will continue his normal activity. He knows he can resume yard work and lifting more than 20 pounds for 3 months after surgery. He knows he'll continue his current medications and will be followed by his primary care physician cardiologist return here as needed.

## 2011-06-13 NOTE — Patient Instructions (Signed)
The patient will finish his cardiac rehabilitation at the hospital. The patient will limit his lifting to less than 20 pounds until 3 months after surgery. The patient will be able to resume a yardwork another routine activities. The patient understands she can resume sexual activity.

## 2011-06-15 ENCOUNTER — Encounter (HOSPITAL_COMMUNITY): Payer: Medicare Other

## 2011-06-18 ENCOUNTER — Encounter (HOSPITAL_COMMUNITY): Payer: Medicare Other

## 2011-06-20 ENCOUNTER — Encounter (HOSPITAL_COMMUNITY): Payer: Medicare Other

## 2011-06-22 ENCOUNTER — Encounter (HOSPITAL_COMMUNITY): Payer: Medicare Other

## 2011-06-25 ENCOUNTER — Encounter (HOSPITAL_COMMUNITY): Payer: Medicare Other

## 2011-06-27 ENCOUNTER — Encounter (HOSPITAL_COMMUNITY): Payer: Medicare Other | Attending: Cardiology

## 2011-06-27 DIAGNOSIS — Z7982 Long term (current) use of aspirin: Secondary | ICD-10-CM | POA: Insufficient documentation

## 2011-06-27 DIAGNOSIS — Z5189 Encounter for other specified aftercare: Secondary | ICD-10-CM | POA: Insufficient documentation

## 2011-06-27 DIAGNOSIS — I2510 Atherosclerotic heart disease of native coronary artery without angina pectoris: Secondary | ICD-10-CM | POA: Insufficient documentation

## 2011-06-27 DIAGNOSIS — I4891 Unspecified atrial fibrillation: Secondary | ICD-10-CM | POA: Insufficient documentation

## 2011-06-27 DIAGNOSIS — Z951 Presence of aortocoronary bypass graft: Secondary | ICD-10-CM | POA: Insufficient documentation

## 2011-06-27 DIAGNOSIS — E669 Obesity, unspecified: Secondary | ICD-10-CM | POA: Insufficient documentation

## 2011-06-27 DIAGNOSIS — Z87891 Personal history of nicotine dependence: Secondary | ICD-10-CM | POA: Insufficient documentation

## 2011-06-29 ENCOUNTER — Encounter (HOSPITAL_COMMUNITY): Payer: Medicare Other

## 2011-07-02 ENCOUNTER — Encounter (HOSPITAL_COMMUNITY): Payer: Medicare Other

## 2011-07-04 ENCOUNTER — Encounter (HOSPITAL_COMMUNITY): Payer: Medicare Other

## 2011-07-06 ENCOUNTER — Encounter (HOSPITAL_COMMUNITY): Payer: Medicare Other

## 2011-07-09 ENCOUNTER — Encounter (HOSPITAL_COMMUNITY): Payer: Medicare Other

## 2011-07-11 ENCOUNTER — Encounter (HOSPITAL_COMMUNITY): Payer: Medicare Other

## 2011-07-13 ENCOUNTER — Encounter (HOSPITAL_COMMUNITY): Payer: Medicare Other

## 2011-07-16 ENCOUNTER — Encounter (HOSPITAL_COMMUNITY): Payer: Medicare Other

## 2011-07-18 ENCOUNTER — Encounter (HOSPITAL_COMMUNITY): Payer: Medicare Other

## 2011-07-20 ENCOUNTER — Encounter (HOSPITAL_COMMUNITY): Payer: Medicare Other

## 2011-07-23 ENCOUNTER — Encounter (HOSPITAL_COMMUNITY): Payer: Medicare Other

## 2011-07-25 ENCOUNTER — Encounter (HOSPITAL_COMMUNITY): Payer: Medicare Other | Attending: Cardiology

## 2011-07-25 DIAGNOSIS — Z5189 Encounter for other specified aftercare: Secondary | ICD-10-CM | POA: Insufficient documentation

## 2011-07-25 DIAGNOSIS — I4891 Unspecified atrial fibrillation: Secondary | ICD-10-CM | POA: Insufficient documentation

## 2011-07-25 DIAGNOSIS — I2510 Atherosclerotic heart disease of native coronary artery without angina pectoris: Secondary | ICD-10-CM | POA: Insufficient documentation

## 2011-07-25 DIAGNOSIS — Z7982 Long term (current) use of aspirin: Secondary | ICD-10-CM | POA: Insufficient documentation

## 2011-07-25 DIAGNOSIS — E669 Obesity, unspecified: Secondary | ICD-10-CM | POA: Insufficient documentation

## 2011-07-25 DIAGNOSIS — Z951 Presence of aortocoronary bypass graft: Secondary | ICD-10-CM | POA: Insufficient documentation

## 2011-07-25 DIAGNOSIS — Z87891 Personal history of nicotine dependence: Secondary | ICD-10-CM | POA: Insufficient documentation

## 2011-07-26 ENCOUNTER — Ambulatory Visit (INDEPENDENT_AMBULATORY_CARE_PROVIDER_SITE_OTHER): Payer: Medicare Other | Admitting: Internal Medicine

## 2011-07-26 DIAGNOSIS — R1032 Left lower quadrant pain: Secondary | ICD-10-CM

## 2011-07-26 DIAGNOSIS — N400 Enlarged prostate without lower urinary tract symptoms: Secondary | ICD-10-CM

## 2011-07-26 DIAGNOSIS — R319 Hematuria, unspecified: Secondary | ICD-10-CM

## 2011-07-26 DIAGNOSIS — R350 Frequency of micturition: Secondary | ICD-10-CM

## 2011-07-26 DIAGNOSIS — R109 Unspecified abdominal pain: Secondary | ICD-10-CM

## 2011-07-26 LAB — POCT URINALYSIS DIPSTICK
Leukocytes, UA: NEGATIVE
Nitrite, UA: NEGATIVE
Protein, UA: NEGATIVE
Urobilinogen, UA: 0.2
pH, UA: 6

## 2011-07-26 NOTE — Progress Notes (Signed)
  Subjective:    Patient ID: Blake [name], male    DOB: 1945-12-04, 65 y.o.   MRN: [id number]  HPI ABDOMINAL PAIN: Location: LLQ Onset: 3 weeks ago   Radiation: no  Severity: up to 1 Quality: dull  Duration: seconds, intermittently  Better with: no Worse with: no triggers Symptoms Nausea/Vomiting: no  Diarrhea: no  Constipation: with meds since heart surgery  Melena/BRBPR: no  Hematemesis: no  Anorexia: no  Fever/Chills: no  Dysuria/ hematuria/ pyuria: only mild intermittent dysuria  Rash: no  Wt loss: no    Past Surgeries: last colonoscopy 2010 negative PMH:no GU disease except BPH , followed by [facility]      Review of Systems     Objective:   Physical Exam General appearance is one of good health and nourishment w/o distress.  Eyes: No conjunctival inflammation or scleral icterus is present.  Oral exam: Dental hygiene is good; lips and gums are healthy appearing.There is no oropharyngeal erythema or exudate noted.   Heart:  Normal rate and regular rhythm. S1 and S2 normal without gallop, murmur, click, rub .S4  Lungs:Chest clear to auscultation; no wheezes, rhonchi,rales ,or rubs present.No increased work of breathing.   Abdomen: bowel sounds normal, soft  But minimally tender suprapubic area > LLQ without masses, organomegaly or hernias noted.  No guarding or rebound   Skin:Warm & dry.  Intact without suspicious lesions or rashes ; no jaundice or tenting  Lymphatic: No lymphadenopathy is noted about the head, neck, axilla, or inguinal areas.   Genitourinary/digital rectal exam: Small varicoele in the left scrotum. No inguinal hernias. Prostate is significantly enlarged without tenderness,asymmetry, induration, or nodules. No adnexal tenderness.             Assessment & Plan:    #1 mild left lower pontine discomfort; discomfort to palpation in the suprapubic area.  #2 benign prostatic hypertrophy  #3 moderate microscopic  hematuria  Plan: Urologic  followup is indicated for the prostate and microscopic hematuria. Urine will be cultured.

## 2011-07-26 NOTE — Patient Instructions (Signed)
Drink to thirst, up to 40 ounces of water daily. Strain urine until seen by Urology.

## 2011-07-27 ENCOUNTER — Encounter (HOSPITAL_COMMUNITY): Payer: Medicare Other

## 2011-07-30 ENCOUNTER — Encounter (HOSPITAL_COMMUNITY): Payer: Medicare Other

## 2011-08-01 ENCOUNTER — Encounter (HOSPITAL_COMMUNITY): Payer: Medicare Other

## 2011-08-03 ENCOUNTER — Encounter (HOSPITAL_COMMUNITY): Payer: Medicare Other

## 2011-08-06 ENCOUNTER — Encounter (HOSPITAL_COMMUNITY): Payer: Medicare Other

## 2011-08-08 ENCOUNTER — Encounter (HOSPITAL_COMMUNITY): Payer: Medicare Other

## 2011-08-10 ENCOUNTER — Encounter (HOSPITAL_COMMUNITY): Payer: Medicare Other

## 2011-08-13 ENCOUNTER — Encounter (HOSPITAL_COMMUNITY): Payer: Medicare Other

## 2011-08-15 ENCOUNTER — Encounter (HOSPITAL_COMMUNITY): Payer: Medicare Other

## 2011-08-17 ENCOUNTER — Encounter (HOSPITAL_COMMUNITY): Payer: Medicare Other

## 2011-08-24 ENCOUNTER — Telehealth: Payer: Self-pay | Admitting: Internal Medicine

## 2011-08-24 NOTE — Telephone Encounter (Signed)
Call return patient [phone]. Patient stated that he has headaches and chest congestion. He was treated while in [place] for dehydration & Salmonella with IV Antibiotics Cipro & Metronidazole. He stated that he was not sure if he was treated properly, however he did have some improvement. He states that he continues to have a dull minor constant dull ache in his abdomen,  chest congestion, non- productive to productive cough, taking acetaminophen bid x 4 days , has not checked temp: fever unknown. He has stated that has sharp pains with coughing.   After speaking with Dr Alwyn Ren he advised patient evaluation. Call was returned to patient. He was advised per Dr Alwyn Ren instruction. Patient was offered appointment today, informed by scheduling all providers schedule were full. Patient was advised Urgent care or Saturday clinic. Patient elected to be seen at [facility]. Appointment scheduled for Saturday 08/25/2011 @ 9 am.

## 2011-08-25 ENCOUNTER — Ambulatory Visit (INDEPENDENT_AMBULATORY_CARE_PROVIDER_SITE_OTHER): Payer: Medicare Other | Admitting: Family Medicine

## 2011-08-25 ENCOUNTER — Ambulatory Visit (HOSPITAL_COMMUNITY): Admission: RE | Admit: 2011-08-25 | Payer: Medicare Other | Source: Ambulatory Visit | Admitting: Family Medicine

## 2011-08-25 ENCOUNTER — Other Ambulatory Visit: Payer: Self-pay | Admitting: Family Medicine

## 2011-08-25 ENCOUNTER — Encounter: Payer: Self-pay | Admitting: Family Medicine

## 2011-08-25 ENCOUNTER — Ambulatory Visit (HOSPITAL_COMMUNITY)
Admission: RE | Admit: 2011-08-25 | Discharge: 2011-08-25 | Disposition: A | Discharge status: Home / Self Care | Payer: Medicare Other | Source: Ambulatory Visit | Attending: Family Medicine | Admitting: Family Medicine

## 2011-08-25 DIAGNOSIS — R05 Cough: Secondary | ICD-10-CM | POA: Insufficient documentation

## 2011-08-25 DIAGNOSIS — R0602 Shortness of breath: Secondary | ICD-10-CM

## 2011-08-25 DIAGNOSIS — R319 Hematuria, unspecified: Secondary | ICD-10-CM

## 2011-08-25 DIAGNOSIS — R109 Unspecified abdominal pain: Secondary | ICD-10-CM | POA: Insufficient documentation

## 2011-08-25 DIAGNOSIS — R5381 Other malaise: Secondary | ICD-10-CM | POA: Insufficient documentation

## 2011-08-25 DIAGNOSIS — I4891 Unspecified atrial fibrillation: Secondary | ICD-10-CM

## 2011-08-25 DIAGNOSIS — R5383 Other fatigue: Secondary | ICD-10-CM | POA: Insufficient documentation

## 2011-08-25 DIAGNOSIS — R059 Cough, unspecified: Secondary | ICD-10-CM | POA: Insufficient documentation

## 2011-08-25 LAB — POCT URINALYSIS DIPSTICK
Ketones, UA: NEGATIVE
Protein, UA: NEGATIVE
Spec Grav, UA: 1.015
pH, UA: 6.5

## 2011-08-25 LAB — COMPLETE METABOLIC PANEL WITH GFR
CO2: 27 mEq/L (ref 19–32)
Creat: 0.72 mg/dL (ref 0.50–1.35)
GFR, Est African American: >89 mL/min (ref >89)
GFR, Est Non African American: >89 mL/min (ref >89)
Glucose, Bld: 90 mg/dL (ref 70–99)
Total Bilirubin: 0.4 mg/dL (ref 0.3–1.2)

## 2011-08-25 LAB — CBC WITH DIFFERENTIAL/PLATELET
Basophils Relative: 1 % (ref 0–1)
Hemoglobin: 14.5 g/dL (ref 13.0–17.0)
Lymphs Abs: 2.2 10*3/uL (ref 0.7–4.0)
MCHC: 33 g/dL (ref 30.0–36.0)
Monocytes Relative: 9 % (ref 3–12)
Neutro Abs: 10 10*3/uL — ABNORMAL HIGH (ref 1.7–7.7)
Neutrophils Relative %: 72 % (ref 43–77)
Platelets: 390 10*3/uL (ref 150–400)
RBC: 4.61 MIL/uL (ref 4.22–5.81)

## 2011-08-25 MED ORDER — DOXYCYCLINE HYCLATE 100 MG PO CAPS: 100.0000 mg | ORAL_CAPSULE | Freq: Two times a day (BID) | ORAL | Status: AC

## 2011-08-25 NOTE — Assessment & Plan Note (Signed)
Regular and rate controlled today.

## 2011-08-25 NOTE — Assessment & Plan Note (Signed)
Will check CXR stat for bacterial pneumonia to direct care and antibiotic choice.. Given not improving over last 10 days and recent stress on immune system.. We will cover for atypical PNA and bronchitits with doxycycline 100 BID x 10 days EVEN IF  CXR returns normal.  Follow up in next few days with primary MD.

## 2011-08-25 NOTE — Patient Instructions (Signed)
Push fluids, rest, we will call with lab, CXR and culture results. Do not fill prescription of antibiotics yet until CXR returns.

## 2011-08-25 NOTE — Progress Notes (Signed)
  Subjective:    Patient ID: Blake [name], male    DOB: 1946-06-03, 65 y.o.   MRN: [id number]  HPI  65 year old male with hx of CAD. afib presents with recent  hx of hospitalization in [place]  Dx with dehydration, salmonella, ameoba infection and UTI.   Had been seen off and on by PCP prior with  Left low abdominal pain.Marland Kitchenin mid October, was ongoing x 3 weeks... Thought likely kidney stone at that time.  4 days after being in [place] he had severe fatigue, abdominal pain. Diarrhea. Hospitalized.. Dx with dehydration, salmonella, ameoba infection and UTI.   Cr and Hg was nml. EKG x  Records reviewed as able  but in Spanish. Given IV fluid. Given 2 different antibiotics on IV. Discharged with cipro x 5 days and metronidazole x 7 days, completed now.  Returned on 10/23. He feels he has not felt much better. Continue fatigue, mainly. Diarrhea resolved. Left lower abdominal pain resolved now , but off and on dull pain in central low abdomen.  No dysuria, no change in urination.  More pain in abdomen when he coughs.  Cough, mildly productive and chest congestion in last week. Runny nose.  Off and on headache. No fever. Starting yesterday haad bloody nasal discharge. No ear pain or face pain.  SOB with walking, and extreme fatigue, no wheeze.       Review of Systems  Constitutional: Positive for fatigue. Negative for fever and chills.  HENT: Positive for congestion, rhinorrhea and postnasal drip. Negative for ear pain, sneezing and neck pain.   Eyes: Negative for pain.  Respiratory: Positive for cough and shortness of breath.   Cardiovascular: Negative for chest pain, palpitations and leg swelling.  Genitourinary: Negative for dysuria, flank pain and penile pain.       Objective:   Physical Exam  Constitutional:       Fatigued appearing male in NAD  HENT:  Head: Normocephalic.  Right Ear: External ear normal.  Left Ear: External ear normal.  Nose: Mucosal edema and  rhinorrhea present.  Mouth/Throat: Uvula is midline, oropharynx is clear and moist and mucous membranes are normal. No oropharyngeal exudate, posterior oropharyngeal edema or posterior oropharyngeal erythema.  Eyes: Conjunctivae, EOM and lids are normal. Pupils are equal, round, and reactive to light.  Neck: Normal range of motion. Neck supple. No mass and no thyromegaly present.  Cardiovascular: Normal rate, normal heart sounds and intact distal pulses.  Exam reveals no gallop and no friction rub.   No murmur heard. Pulmonary/Chest: Effort normal and breath sounds normal. No respiratory distress. He has no wheezes. He has no rales. He exhibits no tenderness.  Abdominal: Soft. Bowel sounds are normal. He exhibits no distension and no mass. There is no hepatosplenomegaly. There is tenderness in the suprapubic area. There is no rebound, no guarding and no CVA tenderness.          Assessment & Plan:

## 2011-08-25 NOTE — Assessment & Plan Note (Addendum)
Recent salmonella, aomeba infeciton.. Treated with broad antibiotics (cipro and flagyl).. Diarrhea resolved. UTI, ? Specific bacteria type (no culture seen in results), but treated with Cipro. UA today shows blood in urine.. Will send for culture. Certainly could be due to stone moving, but pt with minimal pain.

## 2011-08-26 LAB — URINE CULTURE
Colony Count: NO GROWTH
Organism ID, Bacteria: NO GROWTH

## 2011-08-31 ENCOUNTER — Encounter: Payer: Self-pay | Admitting: Cardiology

## 2011-08-31 ENCOUNTER — Ambulatory Visit (INDEPENDENT_AMBULATORY_CARE_PROVIDER_SITE_OTHER): Payer: Medicare Other | Admitting: Cardiology

## 2011-08-31 DIAGNOSIS — I4891 Unspecified atrial fibrillation: Secondary | ICD-10-CM

## 2011-08-31 DIAGNOSIS — I2510 Atherosclerotic heart disease of native coronary artery without angina pectoris: Secondary | ICD-10-CM

## 2011-08-31 NOTE — Patient Instructions (Signed)
Your physician wants you to follow-up in: 1 year with Dr. Wall. You will receive a reminder letter in the mail two months in advance. If you don't receive a letter, please call our office to schedule the follow-up appointment.  

## 2011-08-31 NOTE — Assessment & Plan Note (Signed)
Resolved postop. No clinical recurrence. No change in treatment.

## 2011-08-31 NOTE — Progress Notes (Signed)
HPI Mr. Blake [name] he returns today for evaluation and management coronary disease, history of bypass surgery, and history of paroxysmal A. Fib.  He is having no symptoms of angina or ischemia. He denies any palpitations or symptoms of A. Fib.  Meds reviewed he is compliant.  His biggest concern is he is noticeably over his diet. He is a binge eater particularly at night. He feels very guilty about this. He also is any medications that would  Alleviate this. I am not aware of any her same an appetite suppressant from certain antidepressants.  He was recently in [place] and came down with some infection. He was dehydrated and very sick. He had 2 EKGs done there which he has shown to me today. They're both normal.   Past Medical History  Diagnosis Date  . Elevated PSA   . Hyperlipidemia   . HYPERLIPIDEMIA 05/25/2009  . BINGE EATING DISORDER 05/25/2009  . Chest pain 03/05/2011  . Obesity   . BPH (benign prostatic hyperplasia)   . Gastric ulcer   . CAD (coronary artery disease)     Past Surgical History  Procedure Date  . Polypectomy     x 2  . Coronary artery bypass graft 03/27/2011    x3, Dr Kathlee Nations Trigt      Family History  Problem Relation Age of Onset  . Coronary artery disease Father   . Heart attack Father 48  . Lymphoma    . Diabetes Sister   . Heart attack Sister 15  . Ovarian cancer Sister   . Atrial fibrillation Brother     History   Social History  . Marital Status: Married    Spouse Name: N/A    Number of Children: 4  . Years of Education: N/A   Occupational History  . retired     Social History Main Topics  . Smoking status: Former Smoker    Quit date: 03/04/1988  . Smokeless tobacco: Not on file  . Alcohol Use: Yes     socially  . Drug Use: Not on file  . Sexually Active: Not on file   Other Topics Concern  . Not on file   Social History Narrative   Regular exercise- yes     No Known Allergies  Current Outpatient Prescriptions  Medication  Sig Dispense Refill  . aspirin 81 MG tablet Take 81 mg by mouth daily.        [name] doxycycline (VIBRAMYCIN) 100 MG capsule Take 1 capsule (100 mg total) by mouth 2 (two) times daily.  20 capsule  0  . metoprolol tartrate (LOPRESSOR) 25 MG tablet Take 1 tablet (25 mg total) by mouth 2 (two) times daily.  60 tablet  6  . omeprazole (PRILOSEC) 40 MG capsule Take 1 capsule (40 mg total) by mouth daily.  30 capsule  6  . simvastatin (ZOCOR) 20 MG tablet Take 1 tablet (20 mg total) by mouth at bedtime.  30 tablet  6    ROS Negative other than HPI.   PE General Appearance: well developed, well nourished in no acute distress HEENT: symmetrical face, PERRLA, good dentition  Neck: no JVD, thyromegaly, or adenopathy, trachea midline Chest: symmetric without deformity Cardiac: PMI non-displaced, RRR, normal S1, S2, no gallop or murmur Lung: clear to ausculation and percussion Vascular: all pulses full without bruits  Abdominal: nondistended, nontender, good bowel sounds, no HSM, no bruits Extremities: no cyanosis, clubbing or edema, no sign of DVT, no varicosities  Skin: normal color, no  rashes Neuro: alert and oriented x 3, non-focal Pysch: normal affect Filed Vitals:   08/31/11 1102  BP: 128/68  Pulse: 76  Height: 5\' 6"  (1.676 m)  Weight: 179 lb (81.194 kg)    EKG  Labs and Studies Reviewed.   Lab Results  Component Value Date   WBC 17.1* 03/31/2011   HGB 10.9* 03/31/2011   HCT 32.5* 03/31/2011   MCV 96.4 03/31/2011   PLT 215 03/31/2011      Chemistry      Component Value Date/Time   NA 136 03/31/2011 0330   K 3.5 03/31/2011 0330   CL 99 03/31/2011 0330   CO2 31 03/31/2011 0330   BUN 15 03/31/2011 0330   CREATININE 0.54 03/31/2011 0330      Component Value Date/Time   CALCIUM 8.1* 03/31/2011 0330   ALKPHOS 83 03/23/2011 1127   AST 16 03/23/2011 1127   ALT 14 03/23/2011 1127   BILITOT 0.6 03/23/2011 1127       Lab Results  Component Value Date   CHOL 194 09/20/2009   Lab Results  Component  Value Date   HDL 42.00 09/20/2009   Lab Results  Component Value Date   LDLCALC 135* 09/20/2009   Lab Results  Component Value Date   TRIG 85.0 09/20/2009   Lab Results  Component Value Date   CHOLHDL 5 09/20/2009   Lab Results  Component Value Date   HGBA1C  Value: 5.5 (NOTE)                                                                       According to the ADA Clinical Practice Recommendations for 2011, when HbA1c is used as a screening test:   >=6.5%   Diagnostic of Diabetes Mellitus           (if abnormal result  is confirmed)  5.7-6.4%   Increased risk of developing Diabetes Mellitus  References:Diagnosis and Classification of Diabetes Mellitus,Diabetes Care,2011,34(Suppl 1):S62-S69 and Standards of Medical Care in         Diabetes - 2011,Diabetes Care,2011,34  (Suppl 1):S11-S61. 03/23/2011   Lab Results  Component Value Date   ALT 14 03/23/2011   AST 16 03/23/2011   ALKPHOS 83 03/23/2011   BILITOT 0.6 03/23/2011   Lab Results  Component Value Date   TSH 0.60 12/20/2008

## 2011-08-31 NOTE — Assessment & Plan Note (Signed)
Stable. No change in treatment. 

## 2011-10-01 ENCOUNTER — Encounter (INDEPENDENT_AMBULATORY_CARE_PROVIDER_SITE_OTHER): Payer: Self-pay | Admitting: Surgery

## 2011-10-02 ENCOUNTER — Encounter (INDEPENDENT_AMBULATORY_CARE_PROVIDER_SITE_OTHER): Payer: Self-pay | Admitting: Surgery

## 2011-10-02 ENCOUNTER — Ambulatory Visit (INDEPENDENT_AMBULATORY_CARE_PROVIDER_SITE_OTHER): Payer: Medicare Other | Admitting: Surgery

## 2011-10-02 DIAGNOSIS — K4090 Unilateral inguinal hernia, without obstruction or gangrene, not specified as recurrent: Secondary | ICD-10-CM

## 2011-10-02 NOTE — Progress Notes (Signed)
Chief Complaint  Patient presents with  . New Evaluation    eval of [facility] - referral by Dr. Bjorn Pippin    HISTORY: Patient is a 65 year old white male referred by his urologist with newly diagnosed left inguinal hernia. This is been present for approximately 2 months. He has had minor discomfort. He has had slight constipation. The patient has never had prior hernia repair. He has had no prior abdominal surgery.   Past Medical History  Diagnosis Date  . Elevated PSA   . Hyperlipidemia   . HYPERLIPIDEMIA 05/25/2009  . BINGE EATING DISORDER 05/25/2009  . Chest pain 03/05/2011  . Obesity   . BPH (benign prostatic hyperplasia)   . Gastric ulcer   . CAD (coronary artery disease)   . Symptoms involving urinary system   . Urgency of urination   . Left inguinal hernia   . History of nephrolithiasis      Current Outpatient Prescriptions  Medication Sig Dispense Refill  . aspirin 81 MG tablet Take 81 mg by mouth daily.        . metoprolol tartrate (LOPRESSOR) 25 MG tablet Take 1 tablet (25 mg total) by mouth 2 (two) times daily.  60 tablet  6  . omeprazole (PRILOSEC) 40 MG capsule Take 1 capsule (40 mg total) by mouth daily.  30 capsule  6  . simvastatin (ZOCOR) 20 MG tablet Take 1 tablet (20 mg total) by mouth at bedtime.  30 tablet  6     No Known Allergies   Family History  Problem Relation Age of Onset  . Coronary artery disease Father   . Heart attack Father 16  . Cancer Father   . Lymphoma    . Diabetes Sister   . Heart attack Sister 5  . Ovarian cancer Sister   . Atrial fibrillation Brother      History   Social History  . Marital Status: Married    Spouse Name: N/A    Number of Children: 4  . Years of Education: N/A   Occupational History  . retired     Social History Main Topics  . Smoking status: Former Smoker    Quit date: 03/04/1988  . Smokeless tobacco: Never Used  . Alcohol Use: Yes     socially  . Drug Use: No  . Sexually Active: None   Other  Topics Concern  . None   Social History Narrative   Regular exercise- yes      REVIEW OF SYSTEMS - PERTINENT POSITIVES ONLY: Denies signs or symptoms of obstruction. No current cardiac symptoms following bypass surgery.  EXAM: Filed Vitals:   10/02/11 0847  BP: 118/74  Pulse: 64  Temp: 97.9 F (36.6 C)  Resp: 18    HEENT: normocephalic; pupils equal and reactive; sclerae clear; dentition good; mucous membranes moist NECK:  No nodules; symmetric on extension; no palpable anterior or posterior cervical lymphadenopathy; no supraclavicular masses; no tenderness CHEST: clear to auscultation bilaterally without rales, rhonchi, or wheezes CARDIAC: regular rate and rhythm without significant murmur; peripheral pulses are full GU:  Normal male; no evidence right inguinal hernia; moderate bulge left groin which augments with cough and valsalva; reducible; mildly tender EXT:  non-tender without edema; no deformity NEURO: no gross focal deficits; no sign of tremor   LABORATORY RESULTS: See E-Chart for most recent results   RADIOLOGY RESULTS: See E-Chart or I-Site for most recent results   IMPRESSION: Left inguinal hernia, reducible, mildly symptomatic   PLAN: The patient  and I discussed the indications for repair. We discussed the use of prosthetic mesh. We discussed various techniques for surgery including laparoscopic and open approach. I have recommended an open left inguinal hernia repair with mesh. We have discussed potential complications including recurrence. Patient understands and wishes to proceed. We will make arrangements for surgery at a time convenient for the patient in the near future.  The risks and benefits of the procedure have been discussed at length with the patient.  The patient understands the proposed procedure, potential alternative treatments, and the course of recovery to be expected.  All of the patient's questions have been answered at this time.  The  patient wishes to proceed with surgery and will schedule a date for their procedure through our office staff.   Velora Heckler, MD, FACS General & Endocrine Surgery [facility]    Visit Diagnoses: 1. Inguinal hernia unilateral, non-recurrent, left     Primary Care Physician: Marga Melnick, MD, MD  Urology:  Dr. Bjorn Pippin

## 2011-10-04 ENCOUNTER — Ambulatory Visit: Payer: Medicare Other | Admitting: Cardiology

## 2011-10-18 ENCOUNTER — Encounter (INDEPENDENT_AMBULATORY_CARE_PROVIDER_SITE_OTHER): Payer: Self-pay | Admitting: Urology

## 2011-10-19 ENCOUNTER — Encounter (HOSPITAL_COMMUNITY): Payer: Self-pay | Admitting: Pharmacy Technician

## 2011-10-30 ENCOUNTER — Encounter (HOSPITAL_COMMUNITY)
Admission: RE | Admit: 2011-10-30 | Discharge: 2011-10-30 | Disposition: A | Discharge status: Home / Self Care | Payer: Medicare Other | Source: Ambulatory Visit | Attending: Surgery | Admitting: Surgery

## 2011-10-30 ENCOUNTER — Encounter (HOSPITAL_COMMUNITY): Payer: Self-pay

## 2011-10-30 HISTORY — DX: Angina pectoris, unspecified: I20.9

## 2011-10-30 HISTORY — DX: Cardiac arrhythmia, unspecified: I49.9

## 2011-10-30 LAB — CBC
HCT: 45 % (ref 39.0–52.0)
MCH: 31 pg (ref 26.0–34.0)
MCHC: 32.7 g/dL (ref 30.0–36.0)
MCV: 94.9 fL (ref 78.0–100.0)
RBC: 4.74 MIL/uL (ref 4.22–5.81)
RDW: 13.6 % (ref 11.5–15.5)
WBC: 9.3 10*3/uL (ref 4.0–10.5)

## 2011-10-30 LAB — BASIC METABOLIC PANEL
BUN: 15 mg/dL (ref 6–23)
Calcium: 9.7 mg/dL (ref 8.4–10.5)
Creatinine, Ser: 0.74 mg/dL (ref 0.50–1.35)
GFR calc Af Amer: >90 mL/min (ref >90)
GFR calc non Af Amer: >90 mL/min (ref >90)
Glucose, Bld: 95 mg/dL (ref 70–99)
Potassium: 4.6 mEq/L (ref 3.5–5.1)

## 2011-10-30 LAB — SURGICAL PCR SCREEN: MRSA, PCR: NEGATIVE

## 2011-10-30 NOTE — Pre-Procedure Instructions (Signed)
Chest 11/12 In Epic, EKG  8/12 Epic,  LOV Dr Daleen Squibb 09/10/11 on chart

## 2011-10-30 NOTE — Patient Instructions (Signed)
20 Blake [name]  10/30/2011   Your procedure is scheduled on: 11/06/11   Tuesday  [id number]  surgery  Report to [facility] at 1030AM.  Call this number if you have problems the morning of surgery: [phone]   [name] [id number]   Remember:                    STOP ASPIRIN , antiinflammatories or herbal medicine 7 days pre surgery  Do not eat food:After Midnight.  Monday night  May have clear liquids:until Midnight . Monday night  Clear liquids include soda, tea, black coffee, apple or grape juice, broth.  Take these medicines the morning of surgery with A SIP OF WATER:METOPROLOL,PROLISEC WITH SIP WATER BEFORE COMING TO HOSPITAL          Do not wear jewelry, make-up or nail polish.  Do not wear lotions, powders, or perfumes. You may wear deodorant.  Do not shave 48 hours prior to surgery.  Do not bring valuables to the hospital.  Contacts, dentures or bridgework may not be worn into surgery.  Leave suitcase in the car. After surgery it may be brought to your room.  For patients admitted to the hospital, checkout time is 11:00 AM the day of discharge.   Patients discharged the day of surgery will not be allowed to drive home.  Name and phone number of your driver:wife will come to pick up from short stay    Special Instructions: CHG Shower Use Special Wash: 1/2 bottle night before surgery and 1/2 bottle morning of surgery.  Regular soap face and privates   Please read over the following fact sheets that you were given: MRSA Information

## 2011-11-06 ENCOUNTER — Encounter (HOSPITAL_COMMUNITY)
Admission: RE | Disposition: A | Discharge status: Home / Self Care | Payer: Self-pay | Source: Ambulatory Visit | Attending: Surgery

## 2011-11-06 ENCOUNTER — Ambulatory Visit (HOSPITAL_COMMUNITY): Payer: Medicare Other | Admitting: Anesthesiology

## 2011-11-06 ENCOUNTER — Encounter (HOSPITAL_COMMUNITY): Payer: Self-pay | Admitting: *Deleted

## 2011-11-06 ENCOUNTER — Ambulatory Visit (HOSPITAL_COMMUNITY)
Admission: RE | Admit: 2011-11-06 | Discharge: 2011-11-06 | Disposition: A | Discharge status: Home / Self Care | Payer: Medicare Other | Source: Ambulatory Visit | Attending: Surgery | Admitting: Surgery

## 2011-11-06 ENCOUNTER — Encounter (HOSPITAL_COMMUNITY): Payer: Self-pay | Admitting: Anesthesiology

## 2011-11-06 DIAGNOSIS — Z951 Presence of aortocoronary bypass graft: Secondary | ICD-10-CM | POA: Insufficient documentation

## 2011-11-06 DIAGNOSIS — K5900 Constipation, unspecified: Secondary | ICD-10-CM | POA: Insufficient documentation

## 2011-11-06 DIAGNOSIS — K4090 Unilateral inguinal hernia, without obstruction or gangrene, not specified as recurrent: Secondary | ICD-10-CM

## 2011-11-06 DIAGNOSIS — Z7982 Long term (current) use of aspirin: Secondary | ICD-10-CM | POA: Insufficient documentation

## 2011-11-06 DIAGNOSIS — N189 Chronic kidney disease, unspecified: Secondary | ICD-10-CM | POA: Insufficient documentation

## 2011-11-06 DIAGNOSIS — Z79899 Other long term (current) drug therapy: Secondary | ICD-10-CM | POA: Insufficient documentation

## 2011-11-06 DIAGNOSIS — E785 Hyperlipidemia, unspecified: Secondary | ICD-10-CM | POA: Insufficient documentation

## 2011-11-06 DIAGNOSIS — Z7901 Long term (current) use of anticoagulants: Secondary | ICD-10-CM | POA: Insufficient documentation

## 2011-11-06 DIAGNOSIS — Z01812 Encounter for preprocedural laboratory examination: Secondary | ICD-10-CM | POA: Insufficient documentation

## 2011-11-06 DIAGNOSIS — I2510 Atherosclerotic heart disease of native coronary artery without angina pectoris: Secondary | ICD-10-CM | POA: Insufficient documentation

## 2011-11-06 DIAGNOSIS — I4891 Unspecified atrial fibrillation: Secondary | ICD-10-CM | POA: Insufficient documentation

## 2011-11-06 DIAGNOSIS — N400 Enlarged prostate without lower urinary tract symptoms: Secondary | ICD-10-CM | POA: Insufficient documentation

## 2011-11-06 HISTORY — PX: INGUINAL HERNIA REPAIR: SHX194

## 2011-11-06 SURGERY — REPAIR, HERNIA, INGUINAL, ADULT
Anesthesia: General | Site: Abdomen | Wound class: Clean

## 2011-11-06 MED ORDER — LIDOCAINE HCL 1 % IJ SOLN
INTRAMUSCULAR | Status: DC | PRN
  Administered 2011-11-06: 50 mg via INTRADERMAL

## 2011-11-06 MED ORDER — EPHEDRINE SULFATE 50 MG/ML IJ SOLN
INTRAMUSCULAR | Status: DC | PRN
  Administered 2011-11-06: 10 mg via INTRAVENOUS

## 2011-11-06 MED ORDER — ACETAMINOPHEN 10 MG/ML IV SOLN
INTRAVENOUS | Status: DC | PRN
  Administered 2011-11-06: 1000 mg via INTRAVENOUS

## 2011-11-06 MED ORDER — PROMETHAZINE HCL 25 MG/ML IJ SOLN: 6.2500 mg | INTRAMUSCULAR | Status: DC | PRN

## 2011-11-06 MED ORDER — HYDROCODONE-ACETAMINOPHEN 5-325 MG PO TABS
1.0000 | ORAL_TABLET | ORAL | Status: DC | PRN
  Administered 2011-11-06: 1 via ORAL

## 2011-11-06 MED ORDER — PROPOFOL 10 MG/ML IV EMUL
INTRAVENOUS | Status: DC | PRN
  Administered 2011-11-06: 180 mg via INTRAVENOUS

## 2011-11-06 MED ORDER — ROCURONIUM BROMIDE 100 MG/10ML IV SOLN
INTRAVENOUS | Status: DC | PRN
  Administered 2011-11-06: 25 mg via INTRAVENOUS

## 2011-11-06 MED ORDER — LACTATED RINGERS IV SOLN
INTRAVENOUS | Status: DC | PRN
  Administered 2011-11-06 (×2): via INTRAVENOUS

## 2011-11-06 MED ORDER — ACETAMINOPHEN 10 MG/ML IV SOLN
INTRAVENOUS | Status: AC
  Filled 2011-11-06: qty 100

## 2011-11-06 MED ORDER — NEOSTIGMINE METHYLSULFATE 1 MG/ML IJ SOLN
INTRAMUSCULAR | Status: DC | PRN
  Administered 2011-11-06: 4 mg via INTRAVENOUS

## 2011-11-06 MED ORDER — DEXAMETHASONE SODIUM PHOSPHATE 10 MG/ML IJ SOLN
INTRAMUSCULAR | Status: DC | PRN
  Administered 2011-11-06: 10 mg via INTRAVENOUS

## 2011-11-06 MED ORDER — HYDROCODONE-ACETAMINOPHEN 5-325 MG PO TABS
ORAL_TABLET | ORAL | Status: AC
Start: 2011-11-06 — End: 2011-11-06
  Administered 2011-11-06: 1 via ORAL
  Filled 2011-11-06: qty 1

## 2011-11-06 MED ORDER — LACTATED RINGERS IV SOLN
INTRAVENOUS | Status: DC
  Administered 2011-11-06: 1000 mL via INTRAVENOUS

## 2011-11-06 MED ORDER — CEFAZOLIN SODIUM-DEXTROSE 2-3 GM-% IV SOLR
INTRAVENOUS | Status: AC
  Filled 2011-11-06: qty 50

## 2011-11-06 MED ORDER — BUPIVACAINE LIPOSOME 1.3 % IJ SUSP
20.0000 mL | Freq: Once | INTRAMUSCULAR | Status: DC
  Filled 2011-11-06: qty 20

## 2011-11-06 MED ORDER — MIDAZOLAM HCL 5 MG/5ML IJ SOLN
INTRAMUSCULAR | Status: DC | PRN
  Administered 2011-11-06: 2 mg via INTRAVENOUS

## 2011-11-06 MED ORDER — HYDROCODONE-ACETAMINOPHEN 5-325 MG PO TABS: 1.0000 | ORAL_TABLET | ORAL | Status: AC | PRN

## 2011-11-06 MED ORDER — SUCCINYLCHOLINE CHLORIDE 20 MG/ML IJ SOLN
INTRAMUSCULAR | Status: DC | PRN
  Administered 2011-11-06: 100 mg via INTRAVENOUS

## 2011-11-06 MED ORDER — FENTANYL CITRATE 0.05 MG/ML IJ SOLN
INTRAMUSCULAR | Status: DC | PRN
  Administered 2011-11-06: 100 ug via INTRAVENOUS

## 2011-11-06 MED ORDER — GLYCOPYRROLATE 0.2 MG/ML IJ SOLN
INTRAMUSCULAR | Status: DC | PRN
  Administered 2011-11-06: .6 mg via INTRAVENOUS

## 2011-11-06 MED ORDER — BUPIVACAINE HCL (PF) 0.25 % IJ SOLN
INTRAMUSCULAR | Status: AC
  Filled 2011-11-06: qty 30

## 2011-11-06 MED ORDER — CEFAZOLIN SODIUM-DEXTROSE 2-3 GM-% IV SOLR
2.0000 g | Freq: Once | INTRAVENOUS | Status: AC
  Administered 2011-11-06: 2 g via INTRAVENOUS

## 2011-11-06 MED ORDER — ONDANSETRON HCL 4 MG/2ML IJ SOLN
INTRAMUSCULAR | Status: DC | PRN
  Administered 2011-11-06: 4 mg via INTRAVENOUS

## 2011-11-06 MED ORDER — HYDROMORPHONE HCL PF 1 MG/ML IJ SOLN: 0.2500 mg | INTRAMUSCULAR | Status: DC | PRN

## 2011-11-06 MED ORDER — BUPIVACAINE LIPOSOME 1.3 % IJ SUSP
INTRAMUSCULAR | Status: DC | PRN
  Administered 2011-11-06: 20 mL

## 2011-11-06 MED ORDER — 0.9 % SODIUM CHLORIDE (POUR BTL) OPTIME
TOPICAL | Status: DC | PRN
  Administered 2011-11-06: 1000 mL

## 2011-11-06 SURGICAL SUPPLY — 42 items
APL SKNCLS STERI-STRIP NONHPOA (GAUZE/BANDAGES/DRESSINGS) ×1
APPLICATOR COTTON TIP 6IN STRL (MISCELLANEOUS) ×2 IMPLANT
BENZOIN TINCTURE PRP APPL 2/3 (GAUZE/BANDAGES/DRESSINGS) ×2 IMPLANT
BLADE HEX COATED 2.75 (ELECTRODE) ×2 IMPLANT
BLADE SURG 15 STRL LF DISP TIS (BLADE) ×1 IMPLANT
BLADE SURG 15 STRL SS (BLADE) ×2
BLADE SURG SZ10 CARB STEEL (BLADE) IMPLANT
CANISTER SUCTION 2500CC (MISCELLANEOUS) ×2 IMPLANT
CLOTH BEACON ORANGE TIMEOUT ST (SAFETY) ×2 IMPLANT
DECANTER SPIKE VIAL GLASS SM (MISCELLANEOUS) ×2 IMPLANT
DRAIN PENROSE 18X1/2 LTX STRL (DRAIN) ×2 IMPLANT
DRAPE LAPAROTOMY TRNSV 102X78 (DRAPE) ×2 IMPLANT
ELECT REM PT RETURN 9FT ADLT (ELECTROSURGICAL) ×2
ELECTRODE REM PT RTRN 9FT ADLT (ELECTROSURGICAL) ×1 IMPLANT
GLOVE BIOGEL PI IND STRL 7.0 (GLOVE) ×1 IMPLANT
GLOVE BIOGEL PI IND STRL 7.5 (GLOVE) IMPLANT
GLOVE BIOGEL PI INDICATOR 7.0 (GLOVE) ×3
GLOVE BIOGEL PI INDICATOR 7.5 (GLOVE) ×1
GLOVE INDICATOR 6.5 STRL GRN (GLOVE) ×1 IMPLANT
GLOVE SURG ORTHO 8.0 STRL STRW (GLOVE) ×2 IMPLANT
GLOVE SURG SS PI 7.0 STRL IVOR (GLOVE) ×2 IMPLANT
GOWN STRL NON-REIN LRG LVL3 (GOWN DISPOSABLE) ×5 IMPLANT
GOWN STRL REIN XL XLG (GOWN DISPOSABLE) ×5 IMPLANT
KIT BASIN OR (CUSTOM PROCEDURE TRAY) ×2 IMPLANT
MESH ULTRAPRO 3X6 7.6X15CM (Mesh General) ×1 IMPLANT
NDL HYPO 25X1 1.5 SAFETY (NEEDLE) ×1 IMPLANT
NEEDLE HYPO 25X1 1.5 SAFETY (NEEDLE) ×2 IMPLANT
NS IRRIG 1000ML POUR BTL (IV SOLUTION) ×2 IMPLANT
PACK BASIC VI WITH GOWN DISP (CUSTOM PROCEDURE TRAY) ×2 IMPLANT
PENCIL BUTTON HOLSTER BLD 10FT (ELECTRODE) ×2 IMPLANT
SPONGE GAUZE 4X4 12PLY (GAUZE/BANDAGES/DRESSINGS) ×2 IMPLANT
SPONGE LAP 4X18 X RAY DECT (DISPOSABLE) ×6 IMPLANT
STRIP CLOSURE SKIN 1/2X4 (GAUZE/BANDAGES/DRESSINGS) ×2 IMPLANT
SUT NOVA NAB GS-22 2 0 T19 (SUTURE) ×4 IMPLANT
SUT SILK 2 0 SH (SUTURE) ×2 IMPLANT
SUT VIC AB 3-0 SH 18 (SUTURE) ×3 IMPLANT
SUT VIC AB 4-0 PS2 27 (SUTURE) ×2 IMPLANT
SYR BULB IRRIGATION 50ML (SYRINGE) ×2 IMPLANT
SYR CONTROL 10ML LL (SYRINGE) ×2 IMPLANT
TAPE CLOTH SURG 4X10 WHT LF (GAUZE/BANDAGES/DRESSINGS) ×1 IMPLANT
TOWEL OR 17X26 10 PK STRL BLUE (TOWEL DISPOSABLE) ×2 IMPLANT
YANKAUER SUCT BULB TIP 10FT TU (MISCELLANEOUS) ×2 IMPLANT

## 2011-11-06 NOTE — Anesthesia Postprocedure Evaluation (Signed)
  Anesthesia Post-op Note  Patient: Blake [name]  Procedure(s) Performed:  HERNIA REPAIR INGUINAL ADULT - Repair left inguinal hernia with mesh  Patient Location: PACU  Anesthesia Type: General  Level of Consciousness: awake and alert   Airway and Oxygen Therapy: Patient Spontanous Breathing  Post-op Pain: mild  Post-op Assessment: Post-op Vital signs reviewed, Patient's Cardiovascular Status Stable, Respiratory Function Stable, Patent Airway and No signs of Nausea or vomiting  Post-op Vital Signs: stable  Complications: No apparent anesthesia complications

## 2011-11-06 NOTE — H&P (Signed)
Blake [name] is an 66 y.o. male.    Chief Complaint: Left inguinal hernia  HPI: Patient is a 66 year old white male referred by his urologist with newly diagnosed left inguinal hernia. This is been present for approximately 2 months. He has had minor discomfort. He has had slight constipation. The patient has never had prior hernia repair. He has had no prior abdominal surgery.   Past Medical History  Diagnosis Date  . Elevated PSA   . Hyperlipidemia   . HYPERLIPIDEMIA 05/25/2009  . BINGE EATING DISORDER 05/25/2009  . Chest pain 03/05/2011  . Obesity   . BPH (benign prostatic hyperplasia)   . Gastric ulcer   . CAD (coronary artery disease)   . Symptoms involving urinary system   . Urgency of urination   . Left inguinal hernia   . History of nephrolithiasis   . Angina     none since CABG  . Dysrhythmia     atrial fib post Cabg/ 5 days Coumadin only  . Chronic kidney disease     kidney stones years ago  . Blood transfusion     6/12  following GI Bleed with coumadin therapy    Past Surgical History  Procedure Date  . Polypectomy     x 2  . Stomach surgery 04/12/11    cauterization of bleeding ulcer  and artery in duodenum and stomach   . Wisdom tooth extraction   . Coronary artery bypass graft 03/27/2011    x3, Dr Kathlee Nations Trigt    . Cardiac catheterization     6/12    Family History  Problem Relation Age of Onset  . Coronary artery disease Father   . Heart attack Father 14  . Cancer Father   . Lymphoma    . Diabetes Sister   . Heart attack Sister 1  . Ovarian cancer Sister   . Atrial fibrillation Brother    Social History:  reports that he quit smoking about 23 years ago. He has never used smokeless tobacco. He reports that he drinks about .6 ounces of alcohol per week. He reports that he does not use illicit drugs.  Allergies: No Known Allergies  Medications Prior to Admission  Medication Dose Route Frequency Provider Last Rate Last Dose  . ceFAZolin  (ANCEF) IVPB 2 g/50 mL premix  2 g Intravenous Once Velora Heckler, MD      . lactated ringers infusion   Intravenous Continuous Azell Der, MD 100 mL/hr at 11/06/11 1048 1,000 mL at 11/06/11 1048   Medications Prior to Admission  Medication Sig Dispense Refill  . metoprolol tartrate (LOPRESSOR) 25 MG tablet Take 1 tablet (25 mg total) by mouth 2 (two) times daily.  60 tablet  6  . omeprazole (PRILOSEC) 40 MG capsule Take 1 capsule (40 mg total) by mouth daily.  30 capsule  6  . simvastatin (ZOCOR) 20 MG tablet Take 20 mg by mouth at bedtime.       [name] aspirin 81 MG tablet Take 81 mg by mouth daily before breakfast.         No results found for this or any previous visit (from the past 48 hour(s)). No results found.  Review of Systems  Constitutional: Negative.   HENT: Negative.   Eyes: Negative.   Respiratory: Negative.   Cardiovascular: Negative.   Gastrointestinal: Negative.   Genitourinary: Negative.   Musculoskeletal: Negative.   Skin: Negative.   Neurological: Negative.   Endo/Heme/Allergies: Negative.   Psychiatric/Behavioral:  Negative.     Blood pressure 120/72, pulse 79, temperature 98.2 F (36.8 C), temperature source Oral, resp. rate 18, SpO2 99.00%. Physical Exam  Constitutional: He is oriented to person, place, and time. He appears well-developed and well-nourished.  HENT:  Head: Normocephalic and atraumatic.  Right Ear: External ear normal.  Left Ear: External ear normal.  Nose: Nose normal.  Mouth/Throat: Oropharynx is clear and moist.  Eyes: Conjunctivae and EOM are normal. Pupils are equal, round, and reactive to light.  Neck: Normal range of motion. Neck supple.  Cardiovascular: Normal rate, regular rhythm and normal heart sounds.   Respiratory: Effort normal and breath sounds normal.  GI: Soft. Bowel sounds are normal.  Genitourinary: Rectum normal, prostate normal and penis normal.       Left inguinal hernia, reducible  Musculoskeletal: Normal range  of motion.  Neurological: He is alert and oriented to person, place, and time. He has normal reflexes.  Skin: Skin is warm and dry.  Psychiatric: He has a normal mood and affect. His behavior is normal. Judgment and thought content normal.     Assessment/Plan Left inguinal hernia, reducible  The risks and benefits of the procedure have been discussed at length with the patient.  The patient understands the proposed procedure, potential alternative treatments, and the course of recovery to be expected.  All of the patient's questions have been answered at this time.  The patient wishes to proceed with surgery.  Velora Heckler, MD, FACS General & Endocrine Surgery [facility]    Delisia Mcquiston M 11/06/2011, 11:41 AM

## 2011-11-06 NOTE — Anesthesia Preprocedure Evaluation (Signed)
Anesthesia Evaluation  Patient identified by MRN, date of birth, ID band Patient awake    Reviewed: Allergy & Precautions, H&P , NPO status , Patient's Chart, lab work & pertinent test results  Airway Mallampati: II TM Distance: >3 FB Neck ROM: Full    Dental No notable dental hx.    Pulmonary neg pulmonary ROS, shortness of breath,  clear to auscultation  Pulmonary exam normal       Cardiovascular + angina + CAD and neg cardio ROS + dysrhythmias Regular Normal    Neuro/Psych PSYCHIATRIC DISORDERS Negative Neurological ROS  Negative Psych ROS   GI/Hepatic negative GI ROS, Neg liver ROS, PUD,   Endo/Other  Negative Endocrine ROS  Renal/GU negative Renal ROS  Genitourinary negative   Musculoskeletal negative musculoskeletal ROS (+)   Abdominal   Peds negative pediatric ROS (+)  Hematology negative hematology ROS (+)   Anesthesia Other Findings   Reproductive/Obstetrics negative OB ROS                           Anesthesia Physical Anesthesia Plan  ASA: III  Anesthesia Plan: General   Post-op Pain Management:    Induction: Intravenous  Airway Management Planned: LMA  Additional Equipment:   Intra-op Plan:   Post-operative Plan: Extubation in OR  Informed Consent: I have reviewed the patients History and Physical, chart, labs and discussed the procedure including the risks, benefits and alternatives for the proposed anesthesia with the patient or authorized representative who has indicated his/her understanding and acceptance.   Dental advisory given  Plan Discussed with: CRNA  Anesthesia Plan Comments:         Anesthesia Quick Evaluation

## 2011-11-06 NOTE — Op Note (Signed)
Inguinal Hernia, Open, Procedure Note  Pre-operative Diagnosis:  Left inguinal hernia, reducible  Post-operative Diagnosis: same  Surgeon:  Velora Heckler, MD, FACS  Anesthesia:  General endotracheal, Exparel 20 cc local  Indications: The patient presented with a left, reducible hernia.    Procedure Details  The patient was seen again in the Holding Room. The risks, benefits, complications, treatment options, and expected outcomes were discussed with the patient.  There was concurrence with the proposed plan, and informed consent was obtained. The site of surgery was properly noted/marked. The patient was taken to the Operating Room, identified by name, and the procedure verified as hernia repair. A Time Out was held and the above information confirmed.  The patient was placed in the supine position and underwent induction of anesthesia.  The lower abdomen and groin was prepped and draped in the usual strict aseptic fashion.  After ascertaining that an adequate level of anesthesia had been obtained, and incision is made in the groin with a #10 blade.  Dissection is carried through the subcutaneous tissues and hemostasis obtained with the electrocautery.  A Gelpi retractor is placed for exposure.  The external oblique fascia is incised in line with it's fibers and extended through the external inguinal ring.  The cord structures are dissected out of the inguinal canal and encircled with a Penrose drain.  The floor of the inguinal canal is dissected out.  The cord is explored and a small indirect inguinal hernia sac is dissected out to the level of the internal inguinal ring.  A high ligation is performed with a 2-0 silk suture ligature.  The floor of the inguinal canal is reconstructed with a sheet of mesh cut to the appropriate dimensions.  It is secured to the pubic tubercle with a 2-0 Novafil suture and along the inguinal ligament with a running 2-0 Novafil suture.  Mesh is split to accommodate  the cord structures.  The superior edge of the mesh is secured to the transversalis and internal oblique muscles with interrupted 2-0 Novafil sutures.  The tails of the mesh are overlapped lateral to the cord structures and secured to the inguinal ligament with interrupted 2-0 Novafil sutures to recreate the internal inguinal ring.  Cord structures are returned to the inguinal canal.  Local anesthetic is infiltrated throughout the field.  External oblique fascia is closed with interrupted 3-0 Vicryl sutures.  Subcutaneous tissues are closed with interrupted 3-0 Vicryl sutures.  Skin is anesthetized with local anesthetic, and the skin edges re-approximated with a running 4-0 Monocryl suture.  Wound is washed and dried and benzoin and steristrips are applied.  A gauze dressing is then applied.  Instrument, sponge, and needle counts were correct prior to closure and at the conclusion of the case.  Velora Heckler, MD, FACS General & Endocrine Surgery [facility]   Findings: Hernia as above  Estimated Blood Loss: Minimal         Specimens: None  Complications: None; patient tolerated the procedure well.         Disposition: PACU - hemodynamically stable.         Condition: stable   Velora Heckler, MD, FACS General & Endocrine Surgery [facility]

## 2011-11-06 NOTE — Anesthesia Procedure Notes (Signed)
Procedure Name: Intubation Date/Time: 11/06/2011 12:03 PM Performed by: [place], Destyne Goodreau C Pre-anesthesia Checklist: Patient identified, Timeout performed, Emergency Drugs available, Suction available and Patient being monitored Patient Re-evaluated:Patient Re-evaluated prior to inductionOxygen Delivery Method: Circle System Utilized Preoxygenation: Pre-oxygenation with 100% oxygen Intubation Type: IV induction Ventilation: Mask ventilation without difficulty Laryngoscope Size: Mac and 4 Grade View: Grade II Tube type: Oral Tube size: 7.5 mm Number of attempts: 1 Airway Equipment and Method: stylet Placement Confirmation: ETT inserted through vocal cords under direct vision,  breath sounds checked- equal and bilateral,  positive ETCO2 and CO2 detector Secured at: 22 cm Tube secured with: Tape Dental Injury: Teeth and Oropharynx as per pre-operative assessment

## 2011-11-06 NOTE — Transfer of Care (Signed)
Immediate Anesthesia Transfer of Care Note  Patient: Blake [name]  Procedure(s) Performed:  HERNIA REPAIR INGUINAL ADULT - Repair left inguinal hernia with mesh  Patient Location: PACU  Anesthesia Type: General  Level of Consciousness: awake and alert   Airway & Oxygen Therapy: Patient Spontanous Breathing and Patient connected to face mask oxygen  Post-op Assessment: Report given to PACU RN and Post -op Vital signs reviewed and stable  Post vital signs: Reviewed and stable Filed Vitals:   11/06/11 0946  BP: 120/72  Pulse: 79  Temp: 36.8 C  Resp: 18    Complications: No apparent anesthesia complications

## 2011-11-07 ENCOUNTER — Telehealth (INDEPENDENT_AMBULATORY_CARE_PROVIDER_SITE_OTHER): Payer: Self-pay | Admitting: Surgery

## 2011-11-07 NOTE — Telephone Encounter (Signed)
Pt discharged on 11/06/11 for [facility] repair, needs a 2-3wk po, soonest availabel is 12/12/11, pt is scheduled but didn't know if this was ok, please call if pt needs to be r/s.

## 2011-11-08 ENCOUNTER — Telehealth (INDEPENDENT_AMBULATORY_CARE_PROVIDER_SITE_OTHER): Payer: Self-pay

## 2011-11-08 ENCOUNTER — Encounter (HOSPITAL_COMMUNITY): Payer: Self-pay | Admitting: Surgery

## 2011-11-08 NOTE — Telephone Encounter (Signed)
C/o constipation, voiding well- taking Hydrocodone 5-325, took Colace- patient advised to take  MOM-increase fluids, increase walking- use otc pain medicine to decrease use of narcotic-  if no improvement to call back.

## 2011-11-09 ENCOUNTER — Encounter (INDEPENDENT_AMBULATORY_CARE_PROVIDER_SITE_OTHER): Payer: Self-pay | Admitting: Surgery

## 2011-11-09 ENCOUNTER — Ambulatory Visit (INDEPENDENT_AMBULATORY_CARE_PROVIDER_SITE_OTHER): Payer: Medicare Other | Admitting: Surgery

## 2011-11-09 VITALS — BP 138/82 | HR 72 | Temp 98.4°F | Resp 14 | Ht 66.0 in | Wt 187.0 lb

## 2011-11-09 DIAGNOSIS — K4090 Unilateral inguinal hernia, without obstruction or gangrene, not specified as recurrent: Secondary | ICD-10-CM

## 2011-11-09 NOTE — Progress Notes (Signed)
Blake [name] had a left inguinal hernia 3 days ago by Dr. Gerrit Friends. He presents to the urgent office complaining of a burning sensation between his legs and onto his left thigh. On examination there are no blisters, no redness, no swelling or distortion of the area with this sensation. I do not feel this is infection I wondered whether it could be a traction neuropathy. His incision from his open hernia repair looks fine. The testicle was not discolored and is really not a lot of ecchymosis.  I'm thinking this might be a neuropathy from traction. I recommended that he take his pain meds as ordered, MiraLax to avoid constipation, and apply either a Gold bar and powder to the effected area or use zinc oxide or Neosporin cream as a topical lubricant. He will followup with Dr. Gerrit Friends at his regular scheduled time in was he needs to see him sooner.

## 2011-11-21 ENCOUNTER — Encounter (INDEPENDENT_AMBULATORY_CARE_PROVIDER_SITE_OTHER): Payer: Self-pay | Admitting: Surgery

## 2011-11-21 ENCOUNTER — Ambulatory Visit (INDEPENDENT_AMBULATORY_CARE_PROVIDER_SITE_OTHER): Payer: Medicare Other | Admitting: Surgery

## 2011-11-21 VITALS — BP 128/76 | HR 68 | Temp 97.8°F | Resp 18 | Ht 66.0 in | Wt 192.8 lb

## 2011-11-21 DIAGNOSIS — K4090 Unilateral inguinal hernia, without obstruction or gangrene, not specified as recurrent: Secondary | ICD-10-CM

## 2011-11-21 NOTE — Progress Notes (Signed)
Visit Diagnoses: 1. Inguinal hernia unilateral, non-recurrent, left     HISTORY: Patient returns for her first postoperative visit having undergone left inguinal hernia repair with mesh. Postoperatively he is experiencing significant pain in the groin and extending into the left testicle. Pain in the groin has improved. Left testicular pain continues.  EXAM: Examination shows the incision to be healing nicely. There is some inflammation laterally. Steri-Strips are removed. There is moderate soft tissue swelling. With Valsalva and cough there is no sign of recurrence. Testicle is slightly edematous and remains exquisitely tender to palpation.  IMPRESSION: #1 status post left inguinal hernia repair with mesh, no evidence of recurrence #2 left testicular pain postoperative  PLAN: Patient will begin applying topical creams to his incision. He may benefit from better supportive undergarments. Likely the left testicular pain will just require time to resolve.  Patient will return in 4-6 weeks for followup.  Velora Heckler, MD, FACS General & Endocrine Surgery [facility]

## 2011-11-21 NOTE — Patient Instructions (Signed)
  COCOA BUTTER & VITAMIN E CREAM  ([employer] or other brand)  Apply cocoa butter/vitamin E cream to your incision 2 - 3 times daily.  Massage cream into incision for one minute with each application.  Use sunscreen (50 SPF or higher) for first 6 months after surgery if area is exposed to sun.  You may substitute Mederma or other scar reducing creams as desired.   

## 2011-12-12 ENCOUNTER — Encounter (INDEPENDENT_AMBULATORY_CARE_PROVIDER_SITE_OTHER): Payer: Medicare Other | Admitting: Surgery

## 2011-12-22 ENCOUNTER — Other Ambulatory Visit: Payer: Self-pay | Admitting: Cardiology

## 2011-12-31 ENCOUNTER — Ambulatory Visit (INDEPENDENT_AMBULATORY_CARE_PROVIDER_SITE_OTHER): Payer: Medicare Other | Admitting: Surgery

## 2011-12-31 ENCOUNTER — Encounter (INDEPENDENT_AMBULATORY_CARE_PROVIDER_SITE_OTHER): Payer: Self-pay | Admitting: Surgery

## 2011-12-31 VITALS — BP 117/87 | HR 78 | Temp 98.0°F | Ht 66.0 in | Wt 189.8 lb

## 2011-12-31 DIAGNOSIS — K4090 Unilateral inguinal hernia, without obstruction or gangrene, not specified as recurrent: Secondary | ICD-10-CM

## 2011-12-31 NOTE — Progress Notes (Signed)
Visit Diagnoses: 1. Inguinal hernia unilateral, non-recurrent, left     HISTORY: Patient is a 66 year old white male who underwent left ankle no hernia repair in January 2013. Postoperative course has been complicated by left testicular tenderness. Patient does not have pain at rest. However on palpation of the left testicle he experiences significant pain. Patient notes that his bladder function is somewhat improved. He returns today for evaluation.  EXAM: Surgical incision is well-healed. No sign of infection. With cough or Valsalva there is no sign of recurrent hernia. Palpation of the testicle shows no significant edema or mass. There is moderate tenderness.  IMPRESSION: #1 status post left inguinal hernia repair with mesh #2 postoperative testicular tenderness, improving  PLAN: Patient and I discussed the testicular tenderness at length. This is improving with time. I think it will continue to improve and will not require further intervention. We did discuss potential modalities which would be available to [place] if the pain did not resolve.  Patient will wait until the lease June of 2013 to see if pain continues to improve. If not he will contact me for evaluation.  Velora Heckler, MD, FACS General & Endocrine Surgery [facility]

## 2012-01-26 ENCOUNTER — Other Ambulatory Visit: Payer: Self-pay | Admitting: Cardiology

## 2012-01-29 ENCOUNTER — Other Ambulatory Visit: Payer: Self-pay | Admitting: *Deleted

## 2012-01-29 MED ORDER — SIMVASTATIN 20 MG PO TABS: 20.0000 mg | ORAL_TABLET | Freq: Every day | ORAL | Status: DC

## 2012-02-13 ENCOUNTER — Telehealth: Payer: Self-pay | Admitting: Cardiology

## 2012-02-13 NOTE — Telephone Encounter (Signed)
New Problem:    Patient called about trying to get one of his medications switched to a different prescription.  Please call back and feel free to try his cel phone if he is unavailable at home.

## 2012-02-13 NOTE — Telephone Encounter (Signed)
The Omeprazole was originally Rx'd by another MD while he was out of town.  Dr Daleen Squibb took over the refills.  Omeprazole 40 mg not preferred anymore but Pantoprazole is and this is a lower tier drug.  He would like to change if possible and also is wondering if Dr Daleen Squibb feels like he should continue.  Has not had any stomach issues since he was originally started (hospitalize several yrs ago for bleeding ulcer) and has no GI doctor. Will forward to Caswell Corwin RN and Dr Daleen Squibb for review

## 2012-02-20 NOTE — Telephone Encounter (Signed)
LMOVM okay per Dr. Daleen Squibb to change to Pantoprazole. Asked pt to call back. Mylo Red RN

## 2012-02-20 NOTE — Telephone Encounter (Signed)
Okay to switch. We will re\re prescribe.

## 2012-02-21 NOTE — Telephone Encounter (Signed)
Fu call °Patient returning your call °

## 2012-02-22 MED ORDER — PANTOPRAZOLE SODIUM 40 MG PO TBEC: 40.0000 mg | DELAYED_RELEASE_TABLET | Freq: Every day | ORAL | Status: DC

## 2012-02-22 NOTE — Telephone Encounter (Signed)
Pt aware of medication change. Ordered 90/3 pantoprazole today. Mylo Red RN

## 2012-08-04 ENCOUNTER — Encounter: Payer: Self-pay | Admitting: Internal Medicine

## 2012-08-05 ENCOUNTER — Telehealth: Payer: Self-pay | Admitting: Internal Medicine

## 2012-08-05 MED ORDER — ZOSTER VACCINE LIVE 19400 UNT/0.65ML ~~LOC~~ SOLR: 0.6500 mL | Freq: Once | SUBCUTANEOUS | Status: DC

## 2012-08-05 NOTE — Telephone Encounter (Signed)
Wants Shingles Vaccine sent to [facility] at [address] - [address][phone]

## 2012-08-06 ENCOUNTER — Encounter (INDEPENDENT_AMBULATORY_CARE_PROVIDER_SITE_OTHER): Payer: Self-pay | Admitting: Surgery

## 2012-08-06 ENCOUNTER — Ambulatory Visit (INDEPENDENT_AMBULATORY_CARE_PROVIDER_SITE_OTHER): Payer: Medicare Other | Admitting: Surgery

## 2012-08-06 VITALS — BP 136/72 | HR 68 | Temp 98.0°F | Resp 20 | Ht 66.0 in | Wt 182.2 lb

## 2012-08-06 DIAGNOSIS — R1030 Lower abdominal pain, unspecified: Secondary | ICD-10-CM

## 2012-08-06 DIAGNOSIS — R109 Unspecified abdominal pain: Secondary | ICD-10-CM

## 2012-08-06 DIAGNOSIS — K4090 Unilateral inguinal hernia, without obstruction or gangrene, not specified as recurrent: Secondary | ICD-10-CM

## 2012-08-06 MED ORDER — NAPROXEN 500 MG PO TABS: 500.0000 mg | ORAL_TABLET | Freq: Two times a day (BID) | ORAL | Status: DC

## 2012-08-06 MED ORDER — DOXYCYCLINE HYCLATE 100 MG PO TABS: 100.0000 mg | ORAL_TABLET | Freq: Two times a day (BID) | ORAL | Status: AC

## 2012-08-06 NOTE — Progress Notes (Signed)
General Surgery [facility]  Visit Diagnoses: 1. Inguinal hernia unilateral, non-recurrent, left   2. Pain in the groin, left     HISTORY: The patient is a 66 year old white male who underwent left inguinal hernia repair in January 2013. Postoperatively the patient has had persistent left testicular pain. Over the past 2-3 weeks he has developed discomfort around the surgical site. He presents today for evaluation.  PERTINENT REVIEW OF SYSTEMS: Patient denies any signs of recurrence of his hernia. Bowel and bladder habits are normal without significant change. No recent history of change in physical activity or trauma.  EXAM: HEENT: normocephalic; pupils equal and reactive; sclerae clear; dentition good; mucous membranes moist NECK:  symmetric on extension; no palpable anterior or posterior cervical lymphadenopathy; no supraclavicular masses; no tenderness GU:  Normal male; well-healed left groin incision; palpation in the right inguinal canal with cough and Valsalva shows no sign of hernia; right testicle is nontender; palpation in the left inguinal canal with cough and Valsalva shows no sign of recurrent hernia; marked left testicular tenderness, possibly in the epididymis EXT:  non-tender without edema; no deformity NEURO: no gross focal deficits; no sign of tremor   IMPRESSION: #1 status post left inguinal hernia repair with mesh, no evidence of recurrence #2 left groin pain #3 left testicular pain  PLAN: The patient and I discussed the above findings at length. We are going to take a staged approach to managing his pain. I have asked them to take doxycycline and Naprosyn for 2 weeks. If there is improvement we will continue Naprosyn for an additional 2 weeks.  Patient will return in 4 weeks for evaluation. If there is no improvement we will consider an ilioinguinal nerve block. He may require a series of nerve blocks.  As a last resort, we will consider  sectioning of the ilioinguinal nerve.  Patient will return in 4 weeks for evaluation and possible nerve block.  Velora Heckler, MD, [facility] Office: [phone]

## 2012-08-06 NOTE — Patient Instructions (Signed)
Take doxycycline for two weeks.  Take naprosyn for two weeks, and refill for additional two weeks if improving.  Velora Heckler, MD, [facility] Office: [phone]

## 2012-08-07 ENCOUNTER — Telehealth: Payer: Self-pay | Admitting: Internal Medicine

## 2012-08-07 DIAGNOSIS — E669 Obesity, unspecified: Secondary | ICD-10-CM

## 2012-08-07 NOTE — Telephone Encounter (Signed)
Patient is requesting to see a nutritionist due his difficulty with weight loss. He is not having any other problems or concerns at this time and he said he and Dr.Hopper has discussed this issue in the past. He has not been seen since 07/26/11. Please Vallarie Mare

## 2012-08-07 NOTE — Telephone Encounter (Signed)
Patient is calling wanting a referral to a nutrient therapy . The patient has an appt for tues on next week and the referral needs to put in Blake [name] name and he need this done before his appointment.

## 2012-08-07 NOTE — Telephone Encounter (Signed)
Ok to arrange.

## 2012-08-08 ENCOUNTER — Telehealth (INDEPENDENT_AMBULATORY_CARE_PROVIDER_SITE_OTHER): Payer: Self-pay

## 2012-08-08 NOTE — Telephone Encounter (Signed)
The pt called stating his two prescriptions that Dr [facility].  He said [facility] is not a preferred [facility] with his insurance and [facility] [address].  I called [facility] [address] and [facility].  I spoke to [employer] and she will have her pharmacist transfer it over.

## 2012-08-12 ENCOUNTER — Encounter: Payer: Self-pay | Admitting: Family Medicine

## 2012-08-12 ENCOUNTER — Ambulatory Visit (INDEPENDENT_AMBULATORY_CARE_PROVIDER_SITE_OTHER): Payer: Medicare Other | Admitting: Family Medicine

## 2012-08-12 VITALS — Ht 66.0 in | Wt 185.5 lb

## 2012-08-12 DIAGNOSIS — E669 Obesity, unspecified: Secondary | ICD-10-CM

## 2012-08-12 DIAGNOSIS — E785 Hyperlipidemia, unspecified: Secondary | ICD-10-CM

## 2012-08-12 NOTE — Patient Instructions (Addendum)
-   How, when, and what we eat affects subsequent appetite.    - How:  The attn we give to our food.    - When:  If we allow ourselves to get over-hungry, appetite goes wild.    - What:  The more refined carbohydrates as well as highly processed foods you eat, the more you will likely want to keep eating.  In addn, the right balance of carbohydrate, protein, and fat will help slow down digestion to sustain satiety.      Also, hyper-palatable foods (foods with a lot of fat, sugar, and/or salt):  Hook [place] and keep [place] coming back for more.   - HABIT and routine also plays a role in appetite.    - Recommend:  The Power of Habit by Jerene Canny or Switch:  How to change when change is hard by ___________ & ____________.    1. Eat at least 3 meals and 1-2 snacks per day.  Aim for no more than 5 hours between eating.  2. Breakfast should include a substantial amount of protein, LOW in sugar, and high-fiber carb, and it needs to be ADEQUATE in calories.  (You may want to add 1-2 tbsp nuts & seeds to your cereal.) 3. Obtain twice as many veg's as protein or carbohydrate foods for both lunch and dinner.  - Make three lists of vegetables: (1) those you like and eat now; (2) vegetables you won't even consider; and (3) vegetables you might consider trying if they are prepared a certain way.  Continue to eat veg's you currently eat, but from    this last list, choose a vegetable to try at least 3 times a week.  Use small amounts of this vegetable, cut small, combined with foods or seasonings you like.

## 2012-08-12 NOTE — Progress Notes (Signed)
Medical Nutrition Therapy:  Appt start time: 1000 end time:  1030.  Assessment:  Primary concerns today: Weight management and lipids management.  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  He said his goal is to learn how to better manage his night-time eating, which has frustrated him immensely.  He feels out of control w/ respect to overeating.  He has lost from 185 to 165 lb numerous times in the past, but can't even start to lose weight now.  Blake [name] just completed cardiac rehab following coronary bypass surgery in March 2012.  He lives with his wife, who often is on the computer while Blake [name] is snacking at night.   Usual eating pattern includes 1-2 meals and multiple evening snacks per day. Usual physical activity includes none, which he hopes to change soon.    Everyday foods include 2-3 c black coffee, 2-4 c black tea, 0-2 c sugar-free lemonade, large tossed salad and soup "when dieting."  Avoided foods include most veg's (disliked).  Veg's Blake [name] likes green beans, squash, zuccini, iceburg lettuce, tomatoes, carrots, onion, bell peppers.   24-hr recall: B (8 AM)-   1 plain bagel, 3 c black coffee Snk ( AM)-   none L (2 PM)-  2 c veg soup, 2 c black tea Snk ( PM)-  none D (7:30 PM)-  1 beef patty w/ bacon, tossed salad w/ 4-5 fat-free dressing, baked potato w/ fat-free marg & 4 oz beef gravy, 2 c black tea Snk ( PM)-  none More typical is an evening snack would be half-full box Wheat Thins, plus some sweet like cookies or ice cream.     Progress Towards Goal(s):  In progress.   Nutritional Diagnosis:  NB-1.5 Disordered eating pattern As related to skipped meals and binge eating.  As evidenced by usual pattern of 1-2 meals a day with high-volume evening snacking. NB-2.1 Physical inactivity As related to energy intake.  As evidenced by no regular PA currently.    Intervention:  Nutrition education.  Monitoring/Evaluation:  Dietary intake, exercise, and body weight in 3 weeks.

## 2012-09-02 ENCOUNTER — Encounter: Payer: Self-pay | Admitting: Family Medicine

## 2012-09-02 ENCOUNTER — Ambulatory Visit (INDEPENDENT_AMBULATORY_CARE_PROVIDER_SITE_OTHER): Payer: Medicare Other | Admitting: Family Medicine

## 2012-09-02 VITALS — Ht 66.0 in | Wt 182.5 lb

## 2012-09-02 DIAGNOSIS — E785 Hyperlipidemia, unspecified: Secondary | ICD-10-CM

## 2012-09-02 DIAGNOSIS — E669 Obesity, unspecified: Secondary | ICD-10-CM

## 2012-09-02 NOTE — Progress Notes (Signed)
Medical Nutrition Therapy:  Appt start time: 1000 end time:  1030.  Assessment:  Primary concerns today: Weight management and lipids management.  Rocky Link said he has been eating breakfast more often, and more evenly distributing his food intake through the day, except for two days (including yesterday).  He has seen a dramatic difference in his inclination to snack at night with this new routine, confirmed for him last night when he felt ravenous a couple hours after dinner.  He has not made much progress on incorporating more veg's to his diet, but has at least considered ways he might do so.  Rocky Link has not increased physical activity as yet.    24-hr recall:  (Up at 7 AM) B ( AM)-  none Snk (11 AM)-  1 banana L ( PM)-  none Snk ( PM)-  none D (6 PM)-  2 large servings chx pie Snk (9 PM)-  Handful pretzels, 2 handfuls Cheezits, etc.  Yesterday was atypical in that [place]; rushed out of the house in the AM, and did not bring food.    Progress Towards Goal(s):  In progress.   Nutritional Diagnosis:  Progress noted on NB-1.5 Disordered eating pattern As related to skipped meals and binge eating.  As evidenced by 3 meals a day on most days since last appt. NB-2.1 Physical inactivity As related to energy intake.  As evidenced by no regular PA currently.    Intervention:  Nutrition education.  Monitoring/Evaluation:  Dietary intake, exercise, and body weight in 3 weeks.

## 2012-09-02 NOTE — Patient Instructions (Addendum)
-   If you will be travelling, be sure to:  - Plan ahead (at least the day before).  - Pack food to bring.   - Lunch suggestion:  Add frozen vegetables to your soup as a means to get more veg's into you.   - Continue to work on learning to like vegetables.    - Try more roasted vegetables.    - Condiments for your vegetables:  While you are learning to like veg's, don't worry about adding seasoning. Do use as little as you find satisfying.  Feel free to use onions, garlic, spices, & herbs liberally.    - Make three lists of vegetables: (1) those you like and eat now; (2) vegetables you won't even consider; and (3) vegetables you might consider trying if they are prepared a certain way.  Continue to eat veg's you currently eat, but from     this last list, choose a vegetable to try at least 3 times a week.  Use small amounts of this vegetable, cut small, combined with foods or seasonings you like.   - Holiday gatherings:  Think about sequential eating; eat veg's and fruit before more calorically dense foods.   - Thanksgiving:  Focus on vegetables, and make mindful choices.   - TASTE PREFERENCES ARE LEARNED.  This means that it will get easier to choose foods you know are good for you if you are exposed to them enough.    - AT FOLLOW-UP, WE WILL DISCUSS HOLIDAY EATING AS WELL AS PHYSICAL ACTIVITY.

## 2012-09-09 ENCOUNTER — Other Ambulatory Visit: Payer: Self-pay | Admitting: *Deleted

## 2012-09-09 MED ORDER — SIMVASTATIN 20 MG PO TABS: 20.0000 mg | ORAL_TABLET | Freq: Every day | ORAL | Status: DC

## 2012-09-09 MED ORDER — METOPROLOL TARTRATE 25 MG PO TABS: 25.0000 mg | ORAL_TABLET | Freq: Two times a day (BID) | ORAL | Status: DC

## 2012-09-10 ENCOUNTER — Telehealth: Payer: Self-pay | Admitting: *Deleted

## 2012-09-10 ENCOUNTER — Encounter (INDEPENDENT_AMBULATORY_CARE_PROVIDER_SITE_OTHER): Payer: Medicare Other | Admitting: Surgery

## 2012-09-10 NOTE — Telephone Encounter (Signed)
[facility] with [facility]. Apparently, pt has recently [facility]. His prescriptions of Simvastatin and metoprolol tartrate were refilled yesterday at [facility]. Drenda Freeze states that pt was quite upset that [facility] were [facility] since [facility] were [facility] in on [facility]. She will fill those this am and inform pt that they were filled yesterday but were available at his old pharmacy [facility] who [facility] not [facility]. Mylo Red RN

## 2012-09-11 ENCOUNTER — Telehealth (INDEPENDENT_AMBULATORY_CARE_PROVIDER_SITE_OTHER): Payer: Self-pay

## 2012-09-11 ENCOUNTER — Encounter (INDEPENDENT_AMBULATORY_CARE_PROVIDER_SITE_OTHER): Payer: Self-pay | Admitting: Surgery

## 2012-09-11 NOTE — Telephone Encounter (Signed)
Left msg for pt to call. I am returning his call to determine if his symptoms have improved.

## 2012-09-11 NOTE — Telephone Encounter (Signed)
Pts concern reviewed with Dr Gerrit Friends. Per Dr Ardine Eng request pt advised to d/c naprosyn and monitor symptoms for 2wks. If pain continues to improve pt can f/u prn. If pain does not improve or becomes worse pt is to call and we will make appt for injection here in office.

## 2012-09-12 ENCOUNTER — Encounter: Payer: Self-pay | Admitting: Internal Medicine

## 2012-09-25 ENCOUNTER — Telehealth (INDEPENDENT_AMBULATORY_CARE_PROVIDER_SITE_OTHER): Payer: Self-pay

## 2012-09-25 NOTE — Telephone Encounter (Signed)
Told the patient Dr. Ardine Eng nurse would have to call him back with an appointment since he has nothing available until after Christmas.  He was fine with this.

## 2012-09-25 NOTE — Telephone Encounter (Signed)
Schedule patient for OV with me for ilioinguinal nerve injection with Marcaine and Kenalog.  No emergency.  Thanks,  tmg  Velora Heckler, MD, [facility] Office: [phone]

## 2012-09-25 NOTE — Telephone Encounter (Signed)
Pt stopped his Naproxen two weeks ago per Dr. Ardine Eng recommendation.  The pain has improved somewhat, but has not completely gone away.  He was told to call back to report.

## 2012-09-26 ENCOUNTER — Telehealth (INDEPENDENT_AMBULATORY_CARE_PROVIDER_SITE_OTHER): Payer: Self-pay

## 2012-09-26 NOTE — Telephone Encounter (Signed)
Pt given appt for 10-02-12 per Tmg.

## 2012-09-30 ENCOUNTER — Encounter: Payer: Self-pay | Admitting: Family Medicine

## 2012-09-30 ENCOUNTER — Ambulatory Visit (INDEPENDENT_AMBULATORY_CARE_PROVIDER_SITE_OTHER): Payer: Medicare Other | Admitting: Family Medicine

## 2012-09-30 VITALS — Ht 66.0 in | Wt 185.2 lb

## 2012-09-30 DIAGNOSIS — E669 Obesity, unspecified: Secondary | ICD-10-CM

## 2012-09-30 DIAGNOSIS — E785 Hyperlipidemia, unspecified: Secondary | ICD-10-CM

## 2012-09-30 NOTE — Patient Instructions (Addendum)
-   Recommended book:  The Power of Habit by Jerene Canny - Commit to and DO breakfast EVERY day.   - This should include protein (at least a cup of milk or yogurt; 2 eggs; 2-3 oz meat; 2 tbsp peanut butter)  - Breakfast needs to be adequate in calories.    - Typical breakfast:  Big bowl raisin bran w/ 1 full cup of milk.   - On the go breakfast:  Sandwich & piece of fruit. - Absolutely commit to eating breakfast and lunch daily.   - Remember, your poor food choices have their basis in both the physiological and psychological.  Eating early in the day at least helps the physiological.    - For evening eating:  EACH time you make (or think about making) an unhealthy food choice, write down why you think you're drawn to that food, why you want to eat, etc.   - If you have a day when you DON'T do your targeted behaviors, write about that too:  Where did you go off-track?

## 2012-09-30 NOTE — Progress Notes (Signed)
Medical Nutrition Therapy:  Appt start time: 1000 end time:  1030.  Assessment:  Primary concerns today: Weight management and lipids management.  Rocky Link said he is not doing anything he is "supposed to do"; he almost cancelled his appt b/c he feels his eating issues can't be helped.  He was really down when he first got here.  He admitted that he has had the attitude of "Just do it," which is not exactly specific or tangible, so can't be expected to work.  Upon leaving, he said he felt some renewed optimism, and he will make an effort.    Progress Towards Goal(s):  In progress.   Nutritional Diagnosis:  No further progress noted on NB-1.5 Disordered eating pattern As related to skipped meals and binge eating.  As evidenced by 3 meals a day on most days since last appt. NB-2.1 Physical inactivity As related to energy intake.  As evidenced by no regular PA currently.    Intervention:  Nutrition education.  Monitoring/Evaluation:  Dietary intake, exercise, and body weight in 3 weeks.

## 2012-10-02 ENCOUNTER — Encounter (INDEPENDENT_AMBULATORY_CARE_PROVIDER_SITE_OTHER): Payer: Self-pay | Admitting: Surgery

## 2012-10-02 ENCOUNTER — Ambulatory Visit (INDEPENDENT_AMBULATORY_CARE_PROVIDER_SITE_OTHER): Payer: Medicare Other | Admitting: Surgery

## 2012-10-02 VITALS — BP 122/76 | HR 65 | Temp 98.2°F | Resp 18 | Ht 66.0 in | Wt 181.0 lb

## 2012-10-02 DIAGNOSIS — R109 Unspecified abdominal pain: Secondary | ICD-10-CM

## 2012-10-02 DIAGNOSIS — R1030 Lower abdominal pain, unspecified: Secondary | ICD-10-CM

## 2012-10-02 NOTE — Patient Instructions (Signed)
Check for symptom relief tonight and in the morning.  Call office - ask for Arline Asp - report on symptoms.  Velora Heckler, MD, [facility] Office: [phone]

## 2012-10-02 NOTE — Progress Notes (Signed)
General Surgery [facility]  Visit Diagnoses: 1. Pain in the groin, left     HISTORY: Patient returns with persistent left groin and left testicular pain following left inguinal hernia repair with mesh. Patient was treated with 4 weeks of Naprosyn with approximately a 40% improvement in discomfort. Pain is not constant. It does continue to be aggravating.  PROCEDURE: Left ilioinguinal nerve block is performed.  Block is place medial and just inferior to the left ASIS.  A solution of 8 cc of marcaine and 2 cc of Kenalog (80mg ) is injected and well tolerated.  IMPRESSION: Left groin pain status post left inguinal hernia repair with mesh  PLAN: Patient will contact us tomorrow and report on his symptoms this evening and in the morning. He will be scheduled for a second block in 3-4 weeks if there is symptomatic improvement.  Velora Heckler, MD, FACS General & Endocrine Surgery [facility]

## 2012-10-03 ENCOUNTER — Telehealth (INDEPENDENT_AMBULATORY_CARE_PROVIDER_SITE_OTHER): Payer: Self-pay

## 2012-10-03 NOTE — Telephone Encounter (Signed)
Pt called stating he is getting some resolution of his discomfort. Pain has not completely resolved but has improved. Pt advised I will leave his next appt in system unless he calls and tells me discomfort has completely resolved.

## 2012-10-09 ENCOUNTER — Encounter: Payer: Self-pay | Admitting: Internal Medicine

## 2012-10-09 ENCOUNTER — Ambulatory Visit (AMBULATORY_SURGERY_CENTER): Payer: Medicare Other | Admitting: *Deleted

## 2012-10-09 VITALS — Ht 66.0 in | Wt 180.0 lb

## 2012-10-09 DIAGNOSIS — Z1211 Encounter for screening for malignant neoplasm of colon: Secondary | ICD-10-CM

## 2012-10-09 MED ORDER — MOVIPREP 100 G PO SOLR: ORAL | Status: DC

## 2012-10-28 ENCOUNTER — Ambulatory Visit (INDEPENDENT_AMBULATORY_CARE_PROVIDER_SITE_OTHER): Payer: Medicare Other | Admitting: Family Medicine

## 2012-10-28 ENCOUNTER — Encounter: Payer: Self-pay | Admitting: Family Medicine

## 2012-10-28 VITALS — Ht 66.0 in | Wt 181.6 lb

## 2012-10-28 DIAGNOSIS — E669 Obesity, unspecified: Secondary | ICD-10-CM | POA: Insufficient documentation

## 2012-10-28 DIAGNOSIS — E663 Overweight: Secondary | ICD-10-CM

## 2012-10-28 DIAGNOSIS — E785 Hyperlipidemia, unspecified: Secondary | ICD-10-CM

## 2012-10-28 NOTE — Patient Instructions (Addendum)
Relapse Prevention - Red flags to watch for:  - Skipping breakfast or lunch.  - Not getting veg's for a full day.  - Having a delicious treat in the house, especially out in the open.   - Feeling down or defeated. - Noticing any of these red flags is your cue to get back on track with consistent meal times, etc.    Physical Activity - Goal:  Walk at least 30 minutes 4 X wk.   - SCHEDULE your exercise on the calendar at the beginning of the week.    - Record the number of minutes you walk on a kitchen calendar (or notebook).    - Email Jeannie no later than Feb 1 to report progress: [email or web address].

## 2012-10-28 NOTE — Progress Notes (Signed)
Medical Nutrition Therapy:  Appt start time: 1000 end time:  1030.  Assessment:  Primary concerns today: Weight management and lipids management.  Blake [name] said he did well on some days and not on others over the holidays.  He said, however, that his night time cravings are less frequent and less severe than in the past, and he is convinced that this is b/c he is doing so much better overall in eating adequately early in the day.  He still has not started any exercise routine, but he feels ready to commit to that at this time.  Blake [name] did not buy/read the [employer], "The Power of Habit."  He feels very encouraged with his progress so far.    Progress Towards Goal(s):  In progress.   Nutritional Diagnosis:  No further progress noted on NB-1.5 Disordered eating pattern As related to skipped meals and binge eating.  As evidenced by 3 meals a day on most days since last appt. NB-2.1 Physical inactivity As related to energy intake.  As evidenced by no regular PA currently.    Intervention:  Nutrition education.  Monitoring/Evaluation:  Dietary intake, exercise, and body weight as needed.

## 2012-10-29 ENCOUNTER — Encounter: Payer: Self-pay | Admitting: Cardiology

## 2012-10-29 ENCOUNTER — Ambulatory Visit (INDEPENDENT_AMBULATORY_CARE_PROVIDER_SITE_OTHER): Payer: Medicare Other | Admitting: Cardiology

## 2012-10-29 VITALS — BP 110/60 | HR 53 | Ht 66.0 in | Wt 179.0 lb

## 2012-10-29 DIAGNOSIS — E669 Obesity, unspecified: Secondary | ICD-10-CM

## 2012-10-29 DIAGNOSIS — I4891 Unspecified atrial fibrillation: Secondary | ICD-10-CM

## 2012-10-29 DIAGNOSIS — I2510 Atherosclerotic heart disease of native coronary artery without angina pectoris: Secondary | ICD-10-CM

## 2012-10-29 DIAGNOSIS — Z7901 Long term (current) use of anticoagulants: Secondary | ICD-10-CM

## 2012-10-29 NOTE — Assessment & Plan Note (Signed)
Stable. Continue secondary preventative therapy. Return the office in a year or when necessary.

## 2012-10-29 NOTE — Progress Notes (Signed)
HPI Blake [name] comes in today for the evaluation and management of his history of coronary artery disease, history bypass surgery, and postoperative atrial fibrillation. His atrial fib has  not clinically recurred since the perioperative period.  He denies any angina or symptoms of ischemic heart disease. Sometimes he feels a queasiness at night when he lays down. He attributes this to worry and not reflux.  He is compliant with his medications. He does tend to overeat at times.    Past Medical History  Diagnosis Date  . Elevated PSA   . Hyperlipidemia   . HYPERLIPIDEMIA 05/25/2009  . BINGE EATING DISORDER 05/25/2009  . Chest pain 03/05/2011  . Obesity   . BPH (benign prostatic hyperplasia)   . Gastric ulcer   . CAD (coronary artery disease)   . Symptoms involving urinary system   . Urgency of urination   . Left inguinal hernia   . History of nephrolithiasis   . Angina     none since CABG  . Dysrhythmia     atrial fib post Cabg/ 5 days Coumadin only  . Chronic kidney disease     kidney stones years ago  . Blood transfusion     6/12  following GI Bleed with coumadin therapy    Current Outpatient Prescriptions  Medication Sig Dispense Refill  . aspirin 81 MG tablet Take 81 mg by mouth daily before breakfast.       . metoprolol tartrate (LOPRESSOR) 25 MG tablet Take 1 tablet (25 mg total) by mouth 2 (two) times daily.  60 tablet  3  . pantoprazole (PROTONIX) 40 MG tablet Take 1 tablet (40 mg total) by mouth daily.  90 tablet  3  . simvastatin (ZOCOR) 20 MG tablet Take 1 tablet (20 mg total) by mouth at bedtime.  30 tablet  3  . sodium chloride (OCEAN) 0.65 % nasal spray Place 2 sprays into the nose 2 (two) times daily as needed. Allergies         No Known Allergies  Family History  Problem Relation Age of Onset  . Coronary artery disease Father   . Heart attack Father 52  . Cancer Father   . Lymphoma    . Diabetes Sister   . Heart attack Sister 55  . Ovarian cancer  Sister   . Atrial fibrillation Brother   . Colon cancer Neg Hx   . Stomach cancer Neg Hx     History   Social History  . Marital Status: Married    Spouse Name: N/A    Number of Children: 4  . Years of Education: N/A   Occupational History  . retired     Social History Main Topics  . Smoking status: Former Smoker -- 1.0 packs/day for 25 years    Quit date: 03/04/1988  . Smokeless tobacco: Never Used  . Alcohol Use: 0.6 oz/week    1 Cans of beer per week  . Drug Use: No  . Sexually Active: Not on file   Other Topics Concern  . Not on file   Social History Narrative   Regular exercise- yes     ROS ALL NEGATIVE EXCEPT THOSE NOTED IN HPI  PE  General Appearance: well developed, well nourished in no acute distress HEENT: symmetrical face, PERRLA, good dentition  Neck: no JVD, thyromegaly, or adenopathy, trachea midline Chest: symmetric without deformity Cardiac: PMI non-displaced, RRR, normal S1, S2, no gallop or murmur Lung: clear to ausculation and percussion Vascular: all pulses full  without bruits  Abdominal: nondistended, nontender, good bowel sounds, no HSM, no bruits Extremities: no cyanosis, clubbing or edema, no sign of DVT, no varicosities  Skin: normal color, no rashes Neuro: alert and oriented x 3, non-focal Pysch: normal affect  EKG Sinus bradycardia, otherwise normal EKG BMET    Component Value Date/Time   NA 137 10/30/2011 1055   K 4.6 10/30/2011 1055   CL 104 10/30/2011 1055   CO2 25 10/30/2011 1055   GLUCOSE 95 10/30/2011 1055   BUN 15 10/30/2011 1055   CREATININE 0.74 10/30/2011 1055   CREATININE 0.72 08/25/2011 1047   CALCIUM 9.7 10/30/2011 1055   GFRNONAA >90 10/30/2011 1055   GFRAA >90 10/30/2011 1055    Lipid Panel     Component Value Date/Time   CHOL 194 09/20/2009 0757   TRIG 85.0 09/20/2009 0757   HDL 42.00 09/20/2009 0757   CHOLHDL 5 09/20/2009 0757   VLDL 17.0 09/20/2009 0757   LDLCALC 135* 09/20/2009 0757    CBC    Component Value  Date/Time   WBC 9.3 10/30/2011 1055   RBC 4.74 10/30/2011 1055   HGB 14.7 10/30/2011 1055   HCT 45.0 10/30/2011 1055   PLT 251 10/30/2011 1055   MCV 94.9 10/30/2011 1055   MCH 31.0 10/30/2011 1055   MCHC 32.7 10/30/2011 1055   RDW 13.6 10/30/2011 1055   LYMPHSABS 2.2 08/25/2011 1147   MONOABS 1.3* 08/25/2011 1147   EOSABS 0.2 08/25/2011 1147   BASOSABS 0.1 08/25/2011 1147

## 2012-10-29 NOTE — Patient Instructions (Addendum)
Your physician wants you to follow-up in: 1 year with Dr. Wall. You will receive a reminder letter in the mail two months in advance. If you don't receive a letter, please call our office to schedule the follow-up appointment.  

## 2012-10-29 NOTE — Assessment & Plan Note (Signed)
No clinical recurrence. No change in medical therapy. Patient reassured that this may not return since it was only perioperative period

## 2012-10-30 ENCOUNTER — Ambulatory Visit (AMBULATORY_SURGERY_CENTER): Payer: Medicare Other | Admitting: Internal Medicine

## 2012-10-30 ENCOUNTER — Encounter: Payer: Self-pay | Admitting: Internal Medicine

## 2012-10-30 VITALS — BP 97/53 | HR 53 | Temp 98.0°F | Resp 18 | Ht 66.0 in | Wt 180.0 lb

## 2012-10-30 DIAGNOSIS — Z86010 Personal history of colonic polyps: Secondary | ICD-10-CM

## 2012-10-30 DIAGNOSIS — Z1211 Encounter for screening for malignant neoplasm of colon: Secondary | ICD-10-CM

## 2012-10-30 DIAGNOSIS — D126 Benign neoplasm of colon, unspecified: Secondary | ICD-10-CM

## 2012-10-30 MED ORDER — SODIUM CHLORIDE 0.9 % IV SOLN: 500.0000 mL | INTRAVENOUS | Status: DC

## 2012-10-30 NOTE — Patient Instructions (Addendum)
YOU HAD AN ENDOSCOPIC PROCEDURE TODAY AT THE [facility]: Refer to the procedure report that was given to you for any specific questions about what was found during the examination.  If the procedure report does not answer your questions, please call your gastroenterologist to clarify.  If you requested that your care partner not be given the details of your procedure findings, then the procedure report has been included in a sealed envelope for you to review at your convenience later.  YOU SHOULD EXPECT: Some feelings of bloating in the abdomen. Passage of more gas than usual.  Walking can help get rid of the air that was put into your GI tract during the procedure and reduce the bloating. If you had a lower endoscopy (such as a colonoscopy or flexible sigmoidoscopy) you may notice spotting of blood in your stool or on the toilet paper. If you underwent a bowel prep for your procedure, then you may not have a normal bowel movement for a few days.  DIET: Your first meal following the procedure should be a light meal and then it is ok to progress to your normal diet.  A half-sandwich or bowl of soup is an example of a good first meal.  Heavy or fried foods are harder to digest and may make you feel nauseous or bloated.  Likewise meals heavy in dairy and vegetables can cause extra gas to form and this can also increase the bloating.  Drink plenty of fluids but you should avoid alcoholic beverages for 24 hours.  ACTIVITY: Your care partner should take you home directly after the procedure.  You should plan to take it easy, moving slowly for the rest of the day.  You can resume normal activity the day after the procedure however you should NOT DRIVE or use heavy machinery for 24 hours (because of the sedation medicines used during the test).    SYMPTOMS TO REPORT IMMEDIATELY: A gastroenterologist can be reached at any hour.  During normal business hours, 8:30 AM to 5:00 PM Monday through Friday,  call [phone].  After hours and on weekends, please call the GI answering service at [phone] who will take a message and have the physician on call contact you.   Following lower endoscopy (colonoscopy or flexible sigmoidoscopy):  Excessive amounts of blood in the stool  Significant tenderness or worsening of abdominal pains  Swelling of the abdomen that is new, acute  Fever of 100F or higher   FOLLOW UP: If any biopsies were taken you will be contacted by phone or by letter within the next 1-3 weeks.  Call your gastroenterologist if you have not heard about the biopsies in 3 weeks.  Our staff will call the home number listed on your records the next business day following your procedure to check on you and address any questions or concerns that you may have at that time regarding the information given to you following your procedure. This is a courtesy call and so if there is no answer at the home number and we have not heard from you through the emergency physician on call, we will assume that you have returned to your regular daily activities without incident.  SIGNATURES/CONFIDENTIALITY: You and/or your care partner have signed paperwork which will be entered into your electronic medical record.  These signatures attest to the fact that that the information above on your After Visit Summary has been reviewed and is understood.  Full responsibility of the confidentiality of   this discharge information lies with you and/or your care-partner.   Resume medications. Information on polyps, diverticulosis and high fiber diet given with discharge instructions. 

## 2012-10-30 NOTE — Progress Notes (Signed)
Patient did not experience any of the following events: a burn prior to discharge; a fall within the facility; wrong site/side/patient/procedure/implant event; or a hospital transfer or hospital admission upon discharge from the facility. (G8907) Patient did not have preoperative order for IV antibiotic SSI prophylaxis. (G8918)  

## 2012-10-30 NOTE — Op Note (Signed)
[facility] [address] [place], [address]   COLONOSCOPY PROCEDURE REPORT  PATIENT: [name], [name]  MR#: [id number] BIRTHDATE: [birth year] , 66  yrs. old GENDER: Male ENDOSCOPIST: Roxy Cedar, MD REFERRED XB:[id number] Program Recall PROCEDURE DATE:  10/30/2012 PROCEDURE:   Colonoscopy with snare polypectomy    x 2 ASA CLASS:   Class II INDICATIONS:Patient's personal history of adenomatous colon polyps. Index 2002 (multiple and TVA); 8657,84,69 MEDICATIONS: MAC sedation, administered by CRNA and propofol (Diprivan) 200mg  IV  DESCRIPTION OF PROCEDURE:   After the risks benefits and alternatives of the procedure were thoroughly explained, informed consent was obtained.  A digital rectal exam revealed no abnormalities of the rectum.   The LB [id number]  endoscope was introduced through the anus and advanced to the cecum, which was identified by both the appendix and ileocecal valve. No adverse events experienced.   The quality of the prep was excellent, using MoviPrep  The instrument was then slowly withdrawn as the colon was fully examined.      COLON FINDINGS: Two polyps ranging between 3-3mm in size were found in the ascending colon and transverse colon.  A polypectomy was performed with a cold snare.  The resection was complete and the polyp tissue was completely retrieved.   Moderate diverticulosis was noted in the sigmoid colon.   The colon mucosa was otherwise normal.  Retroflexed views revealed internal hemorrhoids. The time to cecum=2 minutes 06 seconds.  Withdrawal time=10 minutes 30 seconds.  The scope was withdrawn and the procedure completed. COMPLICATIONS: There were no complications.  ENDOSCOPIC IMPRESSION: 1.   Two polyps ranging between 3-82mm in size were found in the ascending colon and transverse colon; polypectomy was performed with a cold snare 2.   Moderate diverticulosis was noted in the sigmoid colon 3.   The  colon mucosa was otherwise normal  RECOMMENDATIONS: 1. Follow up colonoscopy in 5 years   eSigned:  Roxy Cedar, MD 10/30/2012 12:49 PM  cc: Pecola Lawless, MD and The Patient   PATIENT NAME:  Blake [name], Blake [name] MR#: [id number]

## 2012-10-30 NOTE — Progress Notes (Signed)
1248 a/ox3 pleased report to [employer]

## 2012-10-30 NOTE — Progress Notes (Signed)
Called to room to assist during endoscopic procedure.  Patient ID and intended procedure confirmed with present staff. Received instructions for my participation in the procedure from the performing physician.  

## 2012-10-31 ENCOUNTER — Telehealth: Payer: Self-pay | Admitting: *Deleted

## 2012-10-31 ENCOUNTER — Ambulatory Visit: Payer: Medicare Other | Admitting: Internal Medicine

## 2012-10-31 NOTE — Telephone Encounter (Signed)
  Follow up Call-  Call back number 10/30/2012  Post procedure Call Back phone  # [phone]  Permission to leave phone message Yes     Patient questions:  Do you have a fever, pain , or abdominal swelling? no Pain Score  0 *  Have you tolerated food without any problems? yes  Have you been able to return to your normal activities? yes  Do you have any questions about your discharge instructions: Diet   no Medications  no Follow up visit  no  Do you have questions or concerns about your Care? no  Actions: * If pain score is 4 or above: No action needed, pain <4.

## 2012-11-04 ENCOUNTER — Encounter: Payer: Self-pay | Admitting: Internal Medicine

## 2012-11-05 ENCOUNTER — Ambulatory Visit (INDEPENDENT_AMBULATORY_CARE_PROVIDER_SITE_OTHER): Payer: Medicare Other | Admitting: Surgery

## 2012-11-05 ENCOUNTER — Encounter (INDEPENDENT_AMBULATORY_CARE_PROVIDER_SITE_OTHER): Payer: Self-pay | Admitting: Surgery

## 2012-11-05 VITALS — BP 118/64 | HR 68 | Temp 98.0°F | Resp 16 | Ht 66.0 in | Wt 182.8 lb

## 2012-11-05 DIAGNOSIS — R109 Unspecified abdominal pain: Secondary | ICD-10-CM

## 2012-11-05 DIAGNOSIS — R1030 Lower abdominal pain, unspecified: Secondary | ICD-10-CM

## 2012-11-05 NOTE — Progress Notes (Signed)
General Surgery [facility]  Visit Diagnoses: 1. Pain in the groin, left     HISTORY: The patient is an a 67 year old white male who underwent left inguinal hernia repair with mesh. He complains of intermittent discomfort in the left testicle. This is largely positional. It is not constant. Patient has failed treatment with nonsteroidal anti-inflammatory medications. We attempted an ilioinguinal nerve block without significant symptomatic improvement. He returns today to discuss further measures.  EXAM: Surgical incision is well-healed. Soft tissues are symmetric. Palpation in the inguinal canals bilaterally with cough and Valsalva showed no sign of recurrence. Penis and testicles are normal without mass or lesion. There is significant tenderness in the posterior left testicle. Left testicle is moderately larger than the right.  IMPRESSION: Left testicular sensitivity following left inguinal hernia repair with mesh  PLAN: I am going to discuss the case with the patient's urologist. I would like him to be evaluated and perhaps have a testicular ultrasound performed. If there is no intervention from a urologic standpoint which will improve the patient's symptoms, we may consider referral to a pain clinic for further evaluation.  Velora Heckler, MD, FACS General & Endocrine Surgery [facility]

## 2012-11-05 NOTE — Patient Instructions (Signed)
Will arrange evaluation by Dr. Bjorn Pippin.  Velora Heckler, MD, [facility] Office: [phone]

## 2013-03-06 ENCOUNTER — Other Ambulatory Visit: Payer: Self-pay | Admitting: Cardiology

## 2013-03-07 ENCOUNTER — Ambulatory Visit (INDEPENDENT_AMBULATORY_CARE_PROVIDER_SITE_OTHER): Payer: Medicare Other | Admitting: Internal Medicine

## 2013-03-07 ENCOUNTER — Encounter: Payer: Self-pay | Admitting: Internal Medicine

## 2013-03-07 VITALS — BP 128/78 | HR 72 | Temp 98.1°F | Wt 184.0 lb

## 2013-03-07 DIAGNOSIS — J019 Acute sinusitis, unspecified: Secondary | ICD-10-CM | POA: Insufficient documentation

## 2013-03-07 MED ORDER — AMOXICILLIN 500 MG PO TABS: 1000.0000 mg | ORAL_TABLET | Freq: Two times a day (BID) | ORAL | Status: DC

## 2013-03-07 MED ORDER — HYDROCODONE-HOMATROPINE 5-1.5 MG/5ML PO SYRP: 5.0000 mL | ORAL_SOLUTION | Freq: Every evening | ORAL | Status: DC | PRN

## 2013-03-07 NOTE — Progress Notes (Signed)
Subjective:    Patient ID: Blake [name], male    DOB: 1945-11-25, 67 y.o.   MRN: [id number]  HPI Had cystoscopy on Tuesday Respiratory symptoms started soon after that  Started with throat tickle Then chest congestion Bad cough which is dry but keeping him up Now with some head congestion as well  Then noted infection in right eye 3 days ago No sig nasal drainage or PND Has had severe nasal congestion especially at night----hard to breathe unless through mouth  No fever, sweats, shakes Some sore throat No ear pain  Tussin CF helps a little  Current Outpatient Prescriptions on File Prior to Visit  Medication Sig Dispense Refill  . aspirin 81 MG tablet Take 81 mg by mouth daily before breakfast.       . metoprolol tartrate (LOPRESSOR) 25 MG tablet Take 1 tablet (25 mg total) by mouth 2 (two) times daily.  60 tablet  3  . pantoprazole (PROTONIX) 40 MG tablet TAKE 1 TABLET BY MOUTH EVERY DAY  90 tablet  2  . simvastatin (ZOCOR) 20 MG tablet Take 1 tablet (20 mg total) by mouth at bedtime.  30 tablet  3  . sodium chloride (OCEAN) 0.65 % nasal spray Place 2 sprays into the nose 2 (two) times daily as needed. Allergies        No current facility-administered medications on file prior to visit.    No Known Allergies  Past Medical History  Diagnosis Date  . Elevated PSA   . Hyperlipidemia   . HYPERLIPIDEMIA 05/25/2009  . BINGE EATING DISORDER 05/25/2009  . Chest pain 03/05/2011  . Obesity   . BPH (benign prostatic hyperplasia)   . Gastric ulcer   . CAD (coronary artery disease)   . Symptoms involving urinary system   . Urgency of urination   . Left inguinal hernia   . History of nephrolithiasis   . Angina     none since CABG  . Dysrhythmia     atrial fib post Cabg/ 5 days Coumadin only  . Chronic kidney disease     kidney stones years ago  . Blood transfusion     6/12  following GI Bleed with coumadin therapy    Past Surgical History  Procedure Laterality Date  .  Polypectomy      x 2  . Stomach surgery  04/12/11    cauterization of bleeding ulcer  and artery in duodenum and stomach   . Wisdom tooth extraction    . Coronary artery bypass graft  03/27/2011    x3, Dr Kathlee Nations Trigt    . Cardiac catheterization      6/12  . Inguinal hernia repair  11/06/2011    Procedure: HERNIA REPAIR INGUINAL ADULT;  Surgeon: Velora Heckler, MD;  Location: [facility];  Service: General;  Laterality: N/A;  Repair left inguinal hernia with mesh    Family History  Problem Relation Age of Onset  . Coronary artery disease Father   . Heart attack Father 49  . Cancer Father   . Lymphoma    . Diabetes Sister   . Heart attack Sister 25  . Ovarian cancer Sister   . Atrial fibrillation Brother   . Colon cancer Neg Hx   . Stomach cancer Neg Hx     History   Social History  . Marital Status: Married    Spouse Name: N/A    Number of Children: 4  . Years of Education: N/A   Occupational  History  . retired     Social History Main Topics  . Smoking status: Former Smoker -- 1.00 packs/day for 25 years    Quit date: 03/04/1988  . Smokeless tobacco: Never Used  . Alcohol Use: 0.6 oz/week    1 Cans of beer per week     Comment: occ  . Drug Use: No  . Sexually Active: Not on file   Other Topics Concern  . Not on file   Social History Narrative   Regular exercise- yes    Review of Systems No rash No vomiting or diarrhea Low grade headache---using regular acetaminophen     Objective:   Physical Exam  Constitutional: He appears well-developed and well-nourished. No distress.  HENT:  Right Ear: External ear normal.  Left Ear: External ear normal.  Slight pharyngeal injection TMs normal No sinus tenderness Moderate nasal inflammation  Eyes:  Tarsal and bulbar injection on right---no foreign body or discharge now (he cleared away in AM)  Neck: Normal range of motion. Neck supple.  Pulmonary/Chest: Effort normal and breath sounds normal. No respiratory  distress. He has no wheezes. He has no rales.  Lymphadenopathy:    He has no cervical adenopathy.  Skin: No rash noted.          Assessment & Plan:

## 2013-03-07 NOTE — Assessment & Plan Note (Signed)
Likely bacterial with secondary conjunctivitis Will treat with amoxil Cough syrup Would add eye drops (like bleph 10) if eye doesn't improve in the next 48-72 hours

## 2013-03-09 ENCOUNTER — Telehealth: Payer: Self-pay | Admitting: General Practice

## 2013-03-09 MED ORDER — TRAMADOL HCL 50 MG PO TABS: ORAL_TABLET | ORAL | Status: DC

## 2013-03-09 NOTE — Telephone Encounter (Signed)
Pt called regarding a follow up from his visit at [facility]. States that symptoms have been improving however since he has been on medication he has had a severe headache. Pt takes Acetaminophen 1000mg  every 6 hours and it has not  Relieved the pain. Please advise if this may be a side effect of the medication and could we call something in for the HA. Please advise.    Pt uses [facility] at [address].

## 2013-03-09 NOTE — Telephone Encounter (Signed)
   I reviewed the clinic note. He is on narcotic cough syrup at bedtime. Tramadol 50 mg one half to one every 6-8 hours as needed dispense 15. Office visit if headache persists or progresses.

## 2013-03-09 NOTE — Telephone Encounter (Signed)
Med filled and pt notified.  

## 2013-03-11 NOTE — Telephone Encounter (Signed)
Pt called back again today. States that the tramadol has not helped his headaches, says it caused bad dreams when taken at night. Pt still complains of Head congestion. Earliest appt with Dr. Alwyn Ren was on Friday, pt stated he could not wait that long. Pt scheduled with Dr. Beverely Low tomorrow morning.

## 2013-03-12 ENCOUNTER — Encounter: Payer: Self-pay | Admitting: Family Medicine

## 2013-03-12 ENCOUNTER — Ambulatory Visit (INDEPENDENT_AMBULATORY_CARE_PROVIDER_SITE_OTHER): Payer: Medicare Other | Admitting: Family Medicine

## 2013-03-12 VITALS — BP 100/60 | HR 94 | Temp 98.4°F | Wt 184.2 lb

## 2013-03-12 DIAGNOSIS — R519 Headache, unspecified: Secondary | ICD-10-CM | POA: Insufficient documentation

## 2013-03-12 DIAGNOSIS — J019 Acute sinusitis, unspecified: Secondary | ICD-10-CM

## 2013-03-12 DIAGNOSIS — R51 Headache: Secondary | ICD-10-CM | POA: Insufficient documentation

## 2013-03-12 MED ORDER — PREDNISONE 10 MG PO TABS: ORAL_TABLET | ORAL | Status: DC

## 2013-03-12 NOTE — Patient Instructions (Addendum)
Start the prednisone this AM- take w/ food to avoid upset stomach Finish the Amoxicillin as directed Avoid over the counter cough and cold meds- some of these have ingredients that can interfere w/ sleep or cause headaches Drink plenty of fluids Tylenol as needed REST! Heating pad or hot wash cloth on your forehead for pain relief If no improvement or worsening, please call so we can get a CT scan Call with any questions or concerns Hang in there!

## 2013-03-12 NOTE — Assessment & Plan Note (Signed)
New to provider.  Suspect this is due to pt's sinusitis and sinus inflammation.  No red flags on hx or PE.  No features consistent w/ migraine.  OTC cough and cold meds may be causing his HA- encouraged him to stop.  Start course of prednisone to tx sinus inflammation.  If no improvement or worsening will need head CT.  Reviewed supportive care and red flags that should prompt return.  Pt expressed understanding and is in agreement w/ plan.

## 2013-03-12 NOTE — Progress Notes (Signed)
  Subjective:    Patient ID: Blake [name], male    DOB: 1946-05-24, 67 y.o.   MRN: [id number]  HPI HA- was seen at [facility] and dx'd w/ sinus infxn.  Started on Amox, hycodan cough syrup (pt not taking due to side effects).  Cough has significantly improved but for last 3-4 days is having 'constant HA'.  + fatigue, 'extremely tired'.  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  Not sleeping well.  Was given script for tramadol for HAs but this kept him up and he only took 1 time.  HAs are frontal across the forehead.  No photo or phonophobia.  No nausea.  No tooth pain.  No pain w/ leaning forward.  No hx of seasonal allergies.  No SOB, CP.   Review of Systems For ROS see HPI     Objective:   Physical Exam  Vitals reviewed. Constitutional: He is oriented to person, place, and time. He appears well-developed and well-nourished. No distress.  HENT:  Head: Normocephalic and atraumatic.  Mouth/Throat: Mucous membranes are normal.  TMs WNL No TTP over sinuses  Eyes: Conjunctivae and EOM are normal. Pupils are equal, round, and reactive to light.  Neck: Normal range of motion. Neck supple.  Cardiovascular: Normal rate, regular rhythm and normal heart sounds.   Pulmonary/Chest: Effort normal and breath sounds normal. No respiratory distress. He has no wheezes. He has no rales.  Lymphadenopathy:    He has no cervical adenopathy.  Neurological: He is alert and oriented to person, place, and time.  Skin: Skin is warm and dry.  Psychiatric: He has a normal mood and affect. His behavior is normal. Thought content normal.          Assessment & Plan:

## 2013-03-12 NOTE — Assessment & Plan Note (Signed)
Improving w/ abx treatment.  Pt to finish course of meds as directed.  Pt expressed understanding and is in agreement w/ plan.

## 2013-03-24 ENCOUNTER — Telehealth: Payer: Self-pay | Admitting: Internal Medicine

## 2013-03-24 NOTE — Telephone Encounter (Signed)
Patient was seen at [facility] by [facility] and has seen Dr.Tabori since then on 03/12/2013

## 2013-03-24 NOTE — Telephone Encounter (Signed)
[facility] Triage Call Report Triage Record Num: [id number] Operator: Cornell Barman Patient Name: Blake [name] Call Date & Time: 03/07/2013 9:25:33AM Patient Phone: [phone] PCP: Marga Melnick Patient Gender: Male PCP Fax : [phone] Patient DOB: 02-18-1946 Practice Name: [facility] Reason for Call: Caller: [name]/Patient; PCP: Marga Melnick; CB#: [phone]; Call regarding Pink eye, congestion ; Patient states he started with cough, cold . headache and congestion. Onset 03/02/13 with worsening over the week. Cough non productive, keeping him awake ,soreness from coughing and now his lashes in right eye has matting and drainage. Treatment at home with Tylenol , OTC Tussin DM . Some relief and then it returns in the middle of night. Emergent s/sx ruled out per Cough protocol with the exception to "Gradual onset of cough when lying down and relieved after being in a sitting position or standing" . See provider in 24 hours. Appt scheduled to see Dr. Alphonsus Sias , today 03/07/13 at 11:15. Care advise and call back parameters reviewed. Understanding expressed. Protocol(s) Used: Cough - Adult Recommended Outcome per Protocol: See Provider within 24 hours Reason for Outcome: Gradual onset of cough when lying down AND relieved after being in a sitting or standing position Care Advice: ~ Sleep with head elevated on several pillows or in a recliner. ~ SYMPTOM / CONDITION MANAGEMENT ~ Call provider if develop sudden weight gain, swelling of feet and/or pain in the abdomen, fatigue or trouble sleeping. Follow your treatment plan as given to you by your provider to manage symptoms, including reducing salt intake, limiting intake of fluids, monitoring daily weights, and exercising as instructed. ~ 05/

## 2013-07-28 ENCOUNTER — Encounter: Payer: Self-pay | Admitting: *Deleted

## 2013-07-28 ENCOUNTER — Ambulatory Visit (INDEPENDENT_AMBULATORY_CARE_PROVIDER_SITE_OTHER): Payer: Medicare Other | Admitting: Cardiology

## 2013-07-28 ENCOUNTER — Encounter: Payer: Self-pay | Admitting: Cardiology

## 2013-07-28 ENCOUNTER — Other Ambulatory Visit: Payer: Self-pay | Admitting: *Deleted

## 2013-07-28 VITALS — BP 128/64 | HR 58 | Ht 66.0 in | Wt 185.0 lb

## 2013-07-28 DIAGNOSIS — I2510 Atherosclerotic heart disease of native coronary artery without angina pectoris: Secondary | ICD-10-CM

## 2013-07-28 DIAGNOSIS — R002 Palpitations: Secondary | ICD-10-CM | POA: Insufficient documentation

## 2013-07-28 DIAGNOSIS — R079 Chest pain, unspecified: Secondary | ICD-10-CM | POA: Insufficient documentation

## 2013-07-28 DIAGNOSIS — I4891 Unspecified atrial fibrillation: Secondary | ICD-10-CM

## 2013-07-28 MED ORDER — ATORVASTATIN CALCIUM 80 MG PO TABS: 80.0000 mg | ORAL_TABLET | Freq: Every day | ORAL | Status: DC

## 2013-07-28 NOTE — Assessment & Plan Note (Signed)
Continue aspirin and statin. 

## 2013-07-28 NOTE — Patient Instructions (Addendum)
Your physician has recommended you make the following change in your medication: STOP ZOCOR AND START LIPITOR 80mg   Your physician has requested that you have en exercise stress myoview. For further information please visit [email or web address] Please follow instruction sheet, as given.  Your physician recommends that you return for lab work in: 6 weeks, don't eat prior to lab work  Your physician has recommended that you wear an event monitor. Event monitors are medical devices that record the heart's electrical activity. Doctors most often [place] these monitors to diagnose arrhythmias. Arrhythmias are problems with the speed or rhythm of the heartbeat. The monitor is a small, portable device. You can wear one while you do your normal daily activities. This is usually used to diagnose what is causing palpitations/syncope (passing out).  Your physician recommends that you schedule a follow-up appointment in: 4 to 6 weeks

## 2013-07-28 NOTE — Assessment & Plan Note (Signed)
Check [facility]. Question recurrent atrial fibrillation that he noted postoperatively.

## 2013-07-28 NOTE — Assessment & Plan Note (Signed)
Given coronary artery disease I will discontinue Zocor and begin high-dose statin. Begin Lipitor 80 mg daily. Check lipids, liver and CK in 6 weeks.

## 2013-07-28 NOTE — Progress Notes (Signed)
HPI: 67 year old [name] previously followed by Dr. Daleen Squibb for followup of coronary artery disease. Abdominal CT in 2012 showed no aneurysm. Patient had coronary artery bypassing graft in 2012 with a LIMA to the LAD, saphenous vein graft to the first diagonal and saphenous vein graft to the acute marginal. Patient did have postoperative atrial fibrillation which resolved. Note preoperative LV function was normal. Over the past several months he has episodes of his chest fluttering. There is an associated queasy feeling in his chest. There is no dyspnea, syncope or chest pain. He has some dyspnea on exertion but no orthopnea, PND or pedal edema. No exertional chest pain.  Current Outpatient Prescriptions  Medication Sig Dispense Refill  . aspirin 81 MG tablet Take 81 mg by mouth daily before breakfast.       . metoprolol tartrate (LOPRESSOR) 25 MG tablet Take 1 tablet (25 mg total) by mouth 2 (two) times daily.  60 tablet  3  . pantoprazole (PROTONIX) 40 MG tablet TAKE 1 TABLET BY MOUTH EVERY DAY  90 tablet  2  . simvastatin (ZOCOR) Blake MG tablet Take 1 tablet (Blake mg total) by mouth at bedtime.  30 tablet  3  . sodium chloride (OCEAN) 0.65 % nasal spray Place 2 sprays into the nose 2 (two) times daily as needed. Allergies        No current facility-administered medications for this visit.     Past Medical History  Diagnosis Date  . Elevated PSA   . Hyperlipidemia   . HYPERLIPIDEMIA 05/25/2009  . BINGE EATING DISORDER 05/25/2009  . Chest pain 03/05/2011  . Obesity   . BPH (benign prostatic hyperplasia)   . Gastric ulcer   . CAD (coronary artery disease)   . Symptoms involving urinary system   . Urgency of urination   . Left inguinal hernia   . History of nephrolithiasis   . Angina     none since CABG  . Dysrhythmia     atrial fib post Cabg/ 5 days Coumadin only  . Chronic kidney disease     kidney stones years ago  . Blood transfusion     6/12  following GI Bleed with coumadin  therapy    Past Surgical History  Procedure Laterality Date  . Polypectomy      x 2  . Stomach surgery  04/12/11    cauterization of bleeding ulcer  and artery in duodenum and stomach   . Wisdom tooth extraction    . Coronary artery bypass graft  03/27/2011    x3, Dr Kathlee Nations Trigt    . Cardiac catheterization      6/12  . Inguinal hernia repair  11/06/2011    Procedure: HERNIA REPAIR INGUINAL ADULT;  Surgeon: Velora Heckler, MD;  Location: [facility];  Service: General;  Laterality: N/A;  Repair left inguinal hernia with mesh    History   Social History  . Marital Status: Married    Spouse Name: N/A    Number of Children: 4  . Years of Education: N/A   Occupational History  . retired     Social History Main Topics  . Smoking status: Former Smoker -- 1.00 packs/day for 25 years    Quit date: 03/04/1988  . Smokeless tobacco: Never Used  . Alcohol Use: 0.6 oz/week    1 Cans of beer per week     Comment: occ  . Drug Use: No  . Sexual Activity: Not on file  Other Topics Concern  . Not on file   Social History Narrative   Regular exercise- yes     ROS: no fevers or chills, productive cough, hemoptysis, dysphasia, odynophagia, melena, hematochezia, dysuria, hematuria, rash, seizure activity, orthopnea, PND, pedal edema, claudication. Remaining systems are negative.  Physical Exam: Well-developed well-nourished in no acute distress.  Skin is warm and dry.  HEENT is normal.  Neck is supple.  Chest is clear to auscultation with normal expansion.  Cardiovascular exam is regular rate and rhythm.  Abdominal exam nontender or distended. No masses palpated. Extremities show no edema. neuro grossly intact  ECG sinus rhythm at a rate of 58. No ST changes.

## 2013-07-28 NOTE — Assessment & Plan Note (Signed)
Patient has developed a queasy sensation in his chest intermittently that he is concerned about. Plan stress Myoview for risk stratification.

## 2013-07-30 ENCOUNTER — Telehealth: Payer: Self-pay | Admitting: Cardiology

## 2013-07-30 NOTE — Telephone Encounter (Signed)
Left message for pt to call.

## 2013-07-30 NOTE — Telephone Encounter (Signed)
Will forward to [facility] and Dr. Jens Som

## 2013-07-30 NOTE — Telephone Encounter (Signed)
Spoke with pt, we had written a letter for the pt for insuance regarding a trip he is going to have to cancel because of his current evaluation. He has a form for the same reason he needs filled out and signed. The pt will come by the office in the morning to drop off the form. Pt agreed with this plan.

## 2013-07-30 NOTE — Telephone Encounter (Signed)
New Problem  Pt calling about insurance form that he was to fill out// He needs the providers signature.. Please assist.

## 2013-08-13 ENCOUNTER — Ambulatory Visit (HOSPITAL_COMMUNITY): Payer: Medicare Other | Attending: Internal Medicine | Admitting: Radiology

## 2013-08-13 ENCOUNTER — Encounter (INDEPENDENT_AMBULATORY_CARE_PROVIDER_SITE_OTHER): Payer: Medicare Other

## 2013-08-13 ENCOUNTER — Encounter: Payer: Self-pay | Admitting: *Deleted

## 2013-08-13 VITALS — BP 106/66 | HR 77 | Ht 66.0 in | Wt 183.0 lb

## 2013-08-13 DIAGNOSIS — R0602 Shortness of breath: Secondary | ICD-10-CM

## 2013-08-13 DIAGNOSIS — R002 Palpitations: Secondary | ICD-10-CM

## 2013-08-13 DIAGNOSIS — I4891 Unspecified atrial fibrillation: Secondary | ICD-10-CM

## 2013-08-13 DIAGNOSIS — I2510 Atherosclerotic heart disease of native coronary artery without angina pectoris: Secondary | ICD-10-CM | POA: Insufficient documentation

## 2013-08-13 DIAGNOSIS — R079 Chest pain, unspecified: Secondary | ICD-10-CM

## 2013-08-13 DIAGNOSIS — I4949 Other premature depolarization: Secondary | ICD-10-CM

## 2013-08-13 MED ORDER — TECHNETIUM TC 99M SESTAMIBI GENERIC - CARDIOLITE
10.9000 | Freq: Once | INTRAVENOUS | Status: AC | PRN
  Administered 2013-08-13: 10.9 via INTRAVENOUS

## 2013-08-13 MED ORDER — TECHNETIUM TC 99M SESTAMIBI GENERIC - CARDIOLITE
33.0000 | Freq: Once | INTRAVENOUS | Status: AC | PRN
  Administered 2013-08-13: 33 via INTRAVENOUS

## 2013-08-13 NOTE — Progress Notes (Signed)
[facility] SITE 3 NUCLEAR MED [address] [place], [place] [address] [phone]    Cardiology Nuclear Med Study  Blake [name] is a 67 y.o. male     MRN : [id number]     DOB: Jul 04, 1946  Procedure Date: 08/13/2013  Nuclear Med Background Indication for Stress Test:  Evaluation for Ischemia and Graft Patency History:  PAF,CAD, CABG, GXT Cardiac Risk Factors: Family History - CAD, History of Smoking and Lipids  Symptoms:  Chest Pain (last episode of chest discomfort was around 07/22/13), DOE, Fatigue and Palpitations   Nuclear Pre-Procedure Caffeine/Decaff Intake:  None > 12 hrs NPO After: 7:00am   Lungs:  Clear. O2 Sat: 96% on room air. IV 0.9% NS with Angio Cath:  20g  IV Site: R Antecubital x 1, tolerated well IV Started by:  Irean Hong, RN  Chest Size (in):  48 Cup Size: n/a  Height: 5\' 6"  (1.676 m)  Weight:  183 lb (83.008 kg)  BMI:  Body mass index is 29.55 kg/(m^2). Tech Comments:  Held Lopressor x 24 hrs    Nuclear Med Study 1 or 2 day study: 1 day  Stress Test Type:  Stress  Reading MD: Dietrich Pates, MD  Order Authorizing Provider:  Olga Millers, MD  Resting Radionuclide: Technetium 73m Sestamibi  Resting Radionuclide Dose: 10.9 mCi   Stress Radionuclide:  Technetium 62m Sestamibi  Stress Radionuclide Dose: 33.0 mCi           Stress Protocol Rest HR: 77 Stress HR: 155  Rest BP: 106/66 Stress BP: 172/72  Exercise Time (min): 6:47 METS: 8.2   Predicted Max HR: 151 bpm % Max HR: 102.65 bpm Rate Pressure Product: 13244   Dose of Adenosine (mg):  n/a Dose of Lexiscan: n/a mg  Dose of Atropine (mg): n/a Dose of Dobutamine: n/a mcg/kg/min (at max HR)  Stress Test Technologist: Smiley Houseman, CMA-N  Nuclear Technologist:  Doyne Keel, CNMT     Rest Procedure:  Myocardial perfusion imaging was performed at rest 45 minutes following the intravenous administration of Technetium 63m Sestamibi.  Rest ECG: NSR - Normal EKG  Stress Procedure:   The patient exercised on the treadmill utilizing the Bruce Protocol for 6:47 minutes. The patient stopped due to dyspnea and denied any chest pain.  Technetium 43m Sestamibi was injected at peak exercise and myocardial perfusion imaging was performed after a brief delay.  Stress ECG: 1mm ST depression (downsloping) in inferior leads in recovery.  QPS Raw Data Images:  Soft tissue (diaphragm) underlies the heart. Stress Images:  Minimal apical thinning  Otherwise normal perfusion.   Rest Images:  Comparison with the stress images reveals no significant change. Subtraction (SDS):  No evidence of ischemia. Transient Ischemic Dilatation (Normal <1.22):  0.77 Lung/Heart Ratio (Normal <0.45):  0.32  Quantitative Gated Spect Images QGS EDV:  77 ml QGS ESV:  29 ml  Impression Exercise Capacity:  Good exercise capacity. BP Response:  Normal blood pressure response. Clinical Symptoms:  No significant symptoms noted. ECG Impression:  Significant ST abnormalities consistent with ischemia. Comparison with Prior Nuclear Study: No previous nuclear study performed  Overall Impression:  Normal stress nuclear study.  LV Ejection Fraction: 63%.  LV Wall Motion:  NL LV Function; NL Wall Motion  Dietrich Pates

## 2013-08-13 NOTE — Progress Notes (Signed)
Patient ID: Blake [name], male   DOB: 07/30/1946, 67 y.o.   MRN: [id number] E-Cardio verite 30 day cardiac event monitor applied to patient.

## 2013-08-14 ENCOUNTER — Encounter: Payer: Self-pay | Admitting: Internal Medicine

## 2013-08-28 ENCOUNTER — Encounter: Payer: Self-pay | Admitting: Internal Medicine

## 2013-08-28 ENCOUNTER — Ambulatory Visit (INDEPENDENT_AMBULATORY_CARE_PROVIDER_SITE_OTHER): Payer: Medicare Other | Admitting: Internal Medicine

## 2013-08-28 VITALS — BP 110/65 | HR 73 | Temp 98.7°F | Wt 186.8 lb

## 2013-08-28 DIAGNOSIS — I781 Nevus, non-neoplastic: Secondary | ICD-10-CM

## 2013-08-28 DIAGNOSIS — L821 Other seborrheic keratosis: Secondary | ICD-10-CM

## 2013-08-28 DIAGNOSIS — D1801 Hemangioma of skin and subcutaneous tissue: Secondary | ICD-10-CM

## 2013-08-28 NOTE — Patient Instructions (Signed)
Use Eucerin or Aveeno Daily  Moisturizing Lotion  twice a day  for the keratoses. Bathe with moisturizing liquid soap , not bar soap. Use your cell phone camera to monitor  the skin lesions (nevi). Take a photo of the skin lesions every 4 months with a small ruler immediately below the lesion to define any change in size, shape or color.

## 2013-08-28 NOTE — Progress Notes (Signed)
Pre-visit discussion using our clinic review tool, if applicable. No additional management support is needed unless otherwise documented below in the visit note.  

## 2013-08-28 NOTE — Progress Notes (Signed)
  Subjective:    Patient ID: Blake [name], male    DOB: [birth year], 67 y.o.   MRN: [id number]  HPI  He is concerned that skin lesions he has may have some pathologic significance. There's been no significant change in size or color. None of the lesions are painful.  There is no personal or family history of squamous cell skin cancer, basal cell, or melanoma.    Review of Systems  He has no constitutional symptoms of fever, chills, sweats, or weight loss.  He is wearing an event monitor to assess some subjective heart rhythm irregularities. He states that this is reminiscent of the events he had post bypass surgery which proved to be paroxysmal atrial fibrillation.     Objective:   Physical Exam  He appears well-nourished in no distress  He has no lymphadenopathy about the head, neck, or axilla  He has no organomegaly or masses  There is no abnormal hyperpigmentation noted on the full-body screen including exam of the rectum.  He has significant cherry angiomas which are atypically large but otherwise classic  He also has many seborrheic keratotic lesions without associated inflammation. 2 of these lesions on the back are slightly irregular but again not pathologic.        Assessment & Plan:  #1 seborrheic keratoses  #2 cherry angiomata  Plan: See recommendations

## 2013-09-06 ENCOUNTER — Other Ambulatory Visit: Payer: Self-pay | Admitting: Cardiology

## 2013-09-14 ENCOUNTER — Encounter: Payer: Self-pay | Admitting: Cardiology

## 2013-09-14 ENCOUNTER — Other Ambulatory Visit: Payer: Medicare Other

## 2013-09-14 ENCOUNTER — Ambulatory Visit (INDEPENDENT_AMBULATORY_CARE_PROVIDER_SITE_OTHER): Payer: Medicare Other | Admitting: Cardiology

## 2013-09-14 ENCOUNTER — Encounter (INDEPENDENT_AMBULATORY_CARE_PROVIDER_SITE_OTHER): Payer: Self-pay

## 2013-09-14 VITALS — BP 115/72 | HR 80 | Wt 186.0 lb

## 2013-09-14 DIAGNOSIS — I4891 Unspecified atrial fibrillation: Secondary | ICD-10-CM

## 2013-09-14 DIAGNOSIS — R079 Chest pain, unspecified: Secondary | ICD-10-CM

## 2013-09-14 DIAGNOSIS — I2510 Atherosclerotic heart disease of native coronary artery without angina pectoris: Secondary | ICD-10-CM

## 2013-09-14 DIAGNOSIS — R002 Palpitations: Secondary | ICD-10-CM

## 2013-09-14 LAB — CBC WITH DIFFERENTIAL/PLATELET
Basophils Relative: 0.7 % (ref 0.0–3.0)
Eosinophils Absolute: 0.2 10*3/uL (ref 0.0–0.7)
HCT: 45.5 % (ref 39.0–52.0)
Hemoglobin: 15.3 g/dL (ref 13.0–17.0)
Lymphocytes Relative: 22.7 % (ref 12.0–46.0)
Lymphs Abs: 2.2 10*3/uL (ref 0.7–4.0)
MCHC: 33.6 g/dL (ref 30.0–36.0)
MCV: 94.6 fl (ref 78.0–100.0)
Monocytes Absolute: 1 10*3/uL (ref 0.1–1.0)
Neutro Abs: 6.3 10*3/uL (ref 1.4–7.7)
RBC: 4.81 Mil/uL (ref 4.22–5.81)

## 2013-09-14 LAB — BASIC METABOLIC PANEL
BUN: 16 mg/dL (ref 6–23)
CO2: 23 mEq/L (ref 19–32)
Chloride: 109 mEq/L (ref 96–112)
Potassium: 4.2 mEq/L (ref 3.5–5.1)
Sodium: 141 mEq/L (ref 135–145)

## 2013-09-14 LAB — LIPID PANEL
Cholesterol: 105 mg/dL (ref 0–200)
HDL: 42.2 mg/dL
LDL Cholesterol: 56 mg/dL (ref 0–99)
Total CHOL/HDL Ratio: 2
Triglycerides: 33 mg/dL (ref 0.0–149.0)
VLDL: 6.6 mg/dL (ref 0.0–40.0)

## 2013-09-14 LAB — HEPATIC FUNCTION PANEL
ALT: 15 U/L (ref 0–53)
AST: 23 U/L (ref 0–37)
Albumin: 4.1 g/dL (ref 3.5–5.2)
Alkaline Phosphatase: 69 U/L (ref 39–117)
Bilirubin, Direct: 0.2 mg/dL (ref 0.0–0.3)
Total Bilirubin: 0.9 mg/dL (ref 0.3–1.2)
Total Protein: 7.8 g/dL (ref 6.0–8.3)

## 2013-09-14 LAB — CK: Total CK: 101 U/L (ref 7–232)

## 2013-09-14 MED ORDER — METOPROLOL TARTRATE 50 MG PO TABS: 50.0000 mg | ORAL_TABLET | Freq: Two times a day (BID) | ORAL | Status: DC

## 2013-09-14 NOTE — Assessment & Plan Note (Signed)
Patient's monitor shows paroxysmal atrial fibrillation. I will increase his metoprolol to 50 twice a day. Check echocardiogram and TSH. Embolic risk factors of age greater than 42 and coronary artery disease. Check renal function. If normal I will discontinue aspirin and begin apixaban.

## 2013-09-14 NOTE — Progress Notes (Signed)
HPI: Followup of coronary artery disease and atrial fibrillation. Abdominal CT in 2012 showed no aneurysm. Patient had coronary artery bypassing graft in 2012 with a LIMA to the LAD, saphenous vein graft to the first diagonal and saphenous vein graft to the acute marginal. Patient did have postoperative atrial fibrillation which resolved. Note preoperative LV function was normal. I recently saw him and he was complaining of palpitations. Monitor in October of 2014 showed paroxysmal atrial fibrillation. Nuclear study in October 2014 showed an ejection fraction of 63% and normal perfusion. Since I last saw him he denies dyspnea, chest pain or palpitations.   Current Outpatient Prescriptions  Medication Sig Dispense Refill  . aspirin 81 MG tablet Take 81 mg by mouth daily before breakfast.       . atorvastatin (LIPITOR) 80 MG tablet Take 1 tablet (80 mg total) by mouth daily.  30 tablet  12  . metoprolol tartrate (LOPRESSOR) 25 MG tablet TAKE 1 TABLET BY MOUTH TWICE A DAY  180 tablet  0  . pantoprazole (PROTONIX) 40 MG tablet TAKE 1 TABLET BY MOUTH EVERY DAY  90 tablet  2  . sodium chloride (OCEAN) 0.65 % nasal spray Place 2 sprays into the nose 2 (two) times daily as needed. Allergies       . tamsulosin (FLOMAX) 0.4 MG CAPS capsule Take 0.4 mg by mouth daily.       No current facility-administered medications for this visit.     Past Medical History  Diagnosis Date  . Elevated PSA   . Hyperlipidemia   . HYPERLIPIDEMIA 05/25/2009  . BINGE EATING DISORDER 05/25/2009  . Chest pain 03/05/2011  . Obesity   . BPH (benign prostatic hyperplasia)   . Gastric ulcer   . CAD (coronary artery disease)   . Symptoms involving urinary system   . Urgency of urination   . Left inguinal hernia   . History of nephrolithiasis   . Angina     none since CABG  . Dysrhythmia     atrial fib post Cabg/ 5 days Coumadin only  . Chronic kidney disease     kidney stones years ago  . Blood transfusion    6/12  following GI Bleed with coumadin therapy    Past Surgical History  Procedure Laterality Date  . Polypectomy      x 2  . Stomach surgery  04/12/11    cauterization of bleeding ulcer  and artery in duodenum and stomach   . Wisdom tooth extraction    . Coronary artery bypass graft  03/27/2011    x3, Dr Kathlee Nations Trigt    . Cardiac catheterization      6/12  . Inguinal hernia repair  11/06/2011    Procedure: HERNIA REPAIR INGUINAL ADULT;  Surgeon: Velora Heckler, MD;  Location: [facility];  Service: General;  Laterality: N/A;  Repair left inguinal hernia with mesh    History   Social History  . Marital Status: Married    Spouse Name: N/A    Number of Children: 4  . Years of Education: N/A   Occupational History  . retired     Social History Main Topics  . Smoking status: Former Smoker -- 1.00 packs/day for 25 years    Quit date: 03/04/1988  . Smokeless tobacco: Never Used  . Alcohol Use: 0.6 oz/week    1 Cans of beer per week     Comment: occ  . Drug Use: No  .  Sexual Activity: Not on file   Other Topics Concern  . Not on file   Social History Narrative   Regular exercise- yes     ROS: no fevers or chills, productive cough, hemoptysis, dysphasia, odynophagia, melena, hematochezia, dysuria, hematuria, rash, seizure activity, orthopnea, PND, pedal edema, claudication. Remaining systems are negative.  Physical Exam: Well-developed well-nourished in no acute distress.  Skin is warm and dry.  HEENT is normal.  Neck is supple.  Chest is clear to auscultation with normal expansion.  Cardiovascular exam is regular rate and rhythm.  Abdominal exam nontender or distended. No masses palpated. Extremities show no edema. neuro grossly intact

## 2013-09-14 NOTE — Assessment & Plan Note (Signed)
Continue statin. 

## 2013-09-14 NOTE — Addendum Note (Signed)
Addended by: Tonita Phoenix on: 09/14/2013 10:09 AM   Modules accepted: Orders

## 2013-09-14 NOTE — Assessment & Plan Note (Signed)
Continue statin. Check lipids and liver. 

## 2013-09-14 NOTE — Patient Instructions (Signed)
Your physician has requested that you have an echocardiogram. Echocardiography is a painless test that uses sound waves to create images of your heart. It provides your doctor with information about the size and shape of your heart and how well your heart's chambers and valves are working. This procedure takes approximately one hour. There are no restrictions for this procedure.  INCREASE METOPROLOL TO 50MG  TWICE A DAY  Your physician recommends that you HAVE LAB WORK TODAY  Your physician recommends that you schedule a follow-up appointment in: IN 8 WEEKS WITH DR Jens Som

## 2013-09-29 ENCOUNTER — Ambulatory Visit (HOSPITAL_COMMUNITY): Payer: Medicare Other | Attending: Cardiology | Admitting: Radiology

## 2013-09-29 DIAGNOSIS — I2510 Atherosclerotic heart disease of native coronary artery without angina pectoris: Secondary | ICD-10-CM | POA: Insufficient documentation

## 2013-09-29 DIAGNOSIS — I079 Rheumatic tricuspid valve disease, unspecified: Secondary | ICD-10-CM | POA: Insufficient documentation

## 2013-09-29 DIAGNOSIS — R079 Chest pain, unspecified: Secondary | ICD-10-CM | POA: Insufficient documentation

## 2013-09-29 DIAGNOSIS — Z87891 Personal history of nicotine dependence: Secondary | ICD-10-CM | POA: Insufficient documentation

## 2013-09-29 DIAGNOSIS — E669 Obesity, unspecified: Secondary | ICD-10-CM | POA: Insufficient documentation

## 2013-09-29 DIAGNOSIS — E785 Hyperlipidemia, unspecified: Secondary | ICD-10-CM | POA: Insufficient documentation

## 2013-09-29 DIAGNOSIS — I4891 Unspecified atrial fibrillation: Secondary | ICD-10-CM

## 2013-09-29 NOTE — Progress Notes (Signed)
Echocardiogram performed.  

## 2013-11-09 ENCOUNTER — Other Ambulatory Visit: Payer: Self-pay | Admitting: *Deleted

## 2013-11-09 ENCOUNTER — Ambulatory Visit (INDEPENDENT_AMBULATORY_CARE_PROVIDER_SITE_OTHER): Payer: Medicare Other | Admitting: Cardiology

## 2013-11-09 ENCOUNTER — Encounter: Payer: Self-pay | Admitting: Cardiology

## 2013-11-09 VITALS — BP 108/69 | HR 56 | Ht 66.0 in | Wt 192.0 lb

## 2013-11-09 DIAGNOSIS — E785 Hyperlipidemia, unspecified: Secondary | ICD-10-CM

## 2013-11-09 DIAGNOSIS — I4891 Unspecified atrial fibrillation: Secondary | ICD-10-CM

## 2013-11-09 MED ORDER — APIXABAN 5 MG PO TABS: 5.0000 mg | ORAL_TABLET | Freq: Two times a day (BID) | ORAL | Status: DC

## 2013-11-09 NOTE — Assessment & Plan Note (Signed)
Continue statin. Discontinue aspirin given need for anticoagulations.

## 2013-11-09 NOTE — Patient Instructions (Signed)
Your physician wants you to follow-up in: [place] will receive a reminder letter in the mail two months in advance. If you don't receive a letter, please call our office to schedule the follow-up appointment.   STOP ASPIRIN  START ELIQUIS 5 MG ONE TABLET TWICE DAILY  Your physician recommends that you return for lab work in: [place]

## 2013-11-09 NOTE — Assessment & Plan Note (Signed)
Continue statin. 

## 2013-11-09 NOTE — Progress Notes (Signed)
HPI: Followup of coronary artery disease and atrial fibrillation. Abdominal CT in 2012 showed no aneurysm. Patient had coronary artery bypassing graft in 2012 with a LIMA to the LAD, saphenous vein graft to the first diagonal and saphenous vein graft to the acute marginal. Patient did have postoperative atrial fibrillation which resolved. Note preoperative LV function was normal. I recently saw him and he was complaining of palpitations. Monitor in October of 2014 showed paroxysmal atrial fibrillation. Nuclear study in October 2014 showed an ejection fraction of 63% and normal perfusion. Echocardiogram in December of 2014 showed normal LV function, mild left ventricular hypertrophy, moderate to severe left atrial enlargement. Since I last saw him he denies dyspnea, chest pain. He has had several bouts of atrial fibrillation. These make him feel weak and tired and resolve after resting.   Current Outpatient Prescriptions  Medication Sig Dispense Refill  . aspirin 81 MG tablet Take 81 mg by mouth daily before breakfast.       . atorvastatin (LIPITOR) 80 MG tablet Take 1 tablet (80 mg total) by mouth daily.  30 tablet  12  . metoprolol tartrate (LOPRESSOR) 50 MG tablet Take 1 tablet (50 mg total) by mouth 2 (two) times daily.  180 tablet  3  . pantoprazole (PROTONIX) 40 MG tablet TAKE 1 TABLET BY MOUTH EVERY DAY  90 tablet  2  . sodium chloride (OCEAN) 0.65 % nasal spray Place 2 sprays into the nose 2 (two) times daily as needed. Allergies       . tamsulosin (FLOMAX) 0.4 MG CAPS capsule Take 0.4 mg by mouth daily.       No current facility-administered medications for this visit.     Past Medical History  Diagnosis Date  . Elevated PSA   . Hyperlipidemia   . HYPERLIPIDEMIA 05/25/2009  . BINGE EATING DISORDER 05/25/2009  . Chest pain 03/05/2011  . Obesity   . BPH (benign prostatic hyperplasia)   . Gastric ulcer   . CAD (coronary artery disease)   . Symptoms involving urinary system   .  Urgency of urination   . Left inguinal hernia   . History of nephrolithiasis   . Angina     none since CABG  . Dysrhythmia     atrial fib post Cabg/ 5 days Coumadin only  . Chronic kidney disease     kidney stones years ago  . Blood transfusion     6/12  following GI Bleed with coumadin therapy  . Atrial fibrillation     Past Surgical History  Procedure Laterality Date  . Polypectomy      x 2  . Stomach surgery  04/12/11    cauterization of bleeding ulcer  and artery in duodenum and stomach   . Wisdom tooth extraction    . Coronary artery bypass graft  03/27/2011    x3, Dr Tharon Aquas Trigt    . Cardiac catheterization      6/12  . Inguinal hernia repair  11/06/2011    Procedure: HERNIA REPAIR INGUINAL ADULT;  Surgeon: Earnstine Regal, MD;  Location: [facility];  Service: General;  Laterality: N/A;  Repair left inguinal hernia with mesh    History   Social History  . Marital Status: Married    Spouse Name: N/A    Number of Children: 4  . Years of Education: N/A   Occupational History  . retired     Social History Main Topics  . Smoking status: Former  Smoker -- 1.00 packs/day for 25 years    Quit date: 03/04/1988  . Smokeless tobacco: Never Used  . Alcohol Use: 0.6 oz/week    1 Cans of beer per week     Comment: occ  . Drug Use: No  . Sexual Activity: Not on file   Other Topics Concern  . Not on file   Social History Narrative   Regular exercise- yes     ROS: no fevers or chills, productive cough, hemoptysis, dysphasia, odynophagia, melena, hematochezia, dysuria, hematuria, rash, seizure activity, orthopnea, PND, pedal edema, claudication. Remaining systems are negative.  Physical Exam: Well-developed well-nourished in no acute distress.  Skin is warm and dry.  HEENT is normal.  Neck is supple.  Chest is clear to auscultation with normal expansion.  Cardiovascular exam is regular rate and rhythm.  Abdominal exam nontender or distended. No masses  palpated. Extremities show no edema. neuro grossly intact  ECG sinus rhythm with no ST changes.

## 2013-11-09 NOTE — Assessment & Plan Note (Signed)
Patient is in sinus rhythm this morning. He has had several bouts of atrial fibrillation that are symptomatic. Continue metoprolol at present dose. I am hesitant to increase the dose as he has resting bradycardia. If episodes become more frequent or symptomatic we will consider antiarrhythmic. Embolic risk factors of coronary disease and age greater than 78. Discontinue aspirin and begin apixaban 5 mg by mouth twice a day. Check renal function and hemoglobin in 4 weeks.

## 2013-11-11 ENCOUNTER — Encounter: Payer: Self-pay | Admitting: Cardiology

## 2013-11-13 ENCOUNTER — Telehealth: Payer: Self-pay | Admitting: Internal Medicine

## 2013-11-13 ENCOUNTER — Telehealth: Payer: Self-pay | Admitting: Cardiology

## 2013-11-13 NOTE — Telephone Encounter (Signed)
Spoke with patient and his wife.  I advised patient that the first message we received from her/patient was today @ 1212; wife verbalized understanding.  Patient got on phone and states he developed cold s/s 2 days ago and took [employer] brand Nighttime Cold and Flu soft gels.  Patient states sometime after that, he noted that his heart was in afib and has remained in afib since that time, at least 24 hours.  Patient c/o an uneasy feeling and chest discomfort that is normal when his heart is in afib.  Patient states he has chest congestion and cough and has not slept in 2 days.  Patient was told by on-call provider this morning that she would call our office after 0800 and make patient an appointment for today.  Patient c/o constant headache unrelieved by Tylenol and terrible pain when he coughs.  At 0500 today pulse was 98 bpm; currently it is 93 bpm.

## 2013-11-13 NOTE — Telephone Encounter (Signed)
At 1228 I advised patient I would review with Dr. Percival Spanish, DOD to determine if he has additional advice for patient.  Patient verbalized agreement and understanding. Patient does not want to go to the hospital unless absolutely necessary.  He requests an appointment with Dr. Stanford Breed on Monday if possible.  I scheduled patient for 0945 Monday with  Dr. Stanford Breed. I spoke with Dr. Percival Spanish who advised that patient may wait for appointment on Monday with Dr. Stanford Breed and to proceed to ER if he develops increasing SOB, chest discomfort, sustained elevated HR or any other concerning symptoms. He emphasized the importance of starting Eliquis.  Patient verbalized understanding of these instructions and is in agreement; states he is getting Eliquis filled today.

## 2013-11-13 NOTE — Telephone Encounter (Signed)
New message     Husband is in afib---talked to doc on call at 6:30am--she was supposed to get him an appt today---they have not heard back and want to talk to a nurse

## 2013-11-13 NOTE — Telephone Encounter (Addendum)
Patient called this am regarding Atrial Fibrillation.  He felt as though he had been back in Afib for over 24hrs which is unusual for him.  He was having some chest discomfort and not able to sleep for past two days.  He was recently placed on Eliquis but has not started yet.  I recommended that he been seen in the ED this am.  Patient was agreeable with this plan.  Pt called back, discussed situation further.  HR < 100 at his pulse check, still in afib, has not taken Metoprolol 50 mg yet this am. Not dizzy, no presyncope or chest pain. Had duodenal ulcer 2 yr ago, reason for reluctance to take Eliquis.  Not sure what to do.  Advised him: OK to manage from home as long as he is not in danger of falling, passing out, not SOB or having chest pain.   Strongly encouraged him to take the Eliquis but told him bleeding is a risk and he would have to be watched carefully. Continue Protonix.  Advised him I would call the office and have them let him know when he could be seen.   Pt feels better than earlier and says he might be OK even till Monday if no appts today. He is OK with the plan.

## 2013-11-16 ENCOUNTER — Encounter: Payer: Self-pay | Admitting: Cardiology

## 2013-11-16 ENCOUNTER — Ambulatory Visit (INDEPENDENT_AMBULATORY_CARE_PROVIDER_SITE_OTHER): Payer: Medicare Other | Admitting: Cardiology

## 2013-11-16 VITALS — BP 105/65 | HR 78 | Ht 66.0 in | Wt 187.5 lb

## 2013-11-16 DIAGNOSIS — I2510 Atherosclerotic heart disease of native coronary artery without angina pectoris: Secondary | ICD-10-CM

## 2013-11-16 DIAGNOSIS — I4891 Unspecified atrial fibrillation: Secondary | ICD-10-CM

## 2013-11-16 DIAGNOSIS — E785 Hyperlipidemia, unspecified: Secondary | ICD-10-CM

## 2013-11-16 NOTE — Patient Instructions (Signed)
Your physician wants you to follow-up in: 3 MONTHS WITH DR CRENSHAW You will receive a reminder letter in the mail two months in advance. If you don't receive a letter, please call our office to schedule the follow-up appointment.  

## 2013-11-16 NOTE — Progress Notes (Signed)
HPI: Followup of coronary artery disease and atrial fibrillation. Abdominal CT in 2012 showed no aneurysm. Patient had coronary artery bypassing graft in 2012 with a LIMA to the LAD, saphenous vein graft to the first diagonal and saphenous vein graft to the acute marginal. Patient did have postoperative atrial fibrillation which resolved. Note preoperative LV function was normal. Monitor in October of 2014 showed paroxysmal atrial fibrillation. Nuclear study in October 2014 showed an ejection fraction of 63% and normal perfusion. Echocardiogram in December of 2014 showed normal LV function, mild left ventricular hypertrophy, moderate to severe left atrial enlargement. Patient had a URI last week. He took a decongestant. He then felt as though he was in recurrent atrial fibrillation with associated palpitations. He otherwise denies dyspnea, chest pain or syncope. No Bleeding.   Current Outpatient Prescriptions  Medication Sig Dispense Refill  . apixaban (ELIQUIS) 5 MG TABS tablet Take 1 tablet (5 mg total) by mouth 2 (two) times daily.  60 tablet  6  . atorvastatin (LIPITOR) 80 MG tablet Take 1 tablet (80 mg total) by mouth daily.  30 tablet  12  . metoprolol tartrate (LOPRESSOR) 50 MG tablet Take 1 tablet (50 mg total) by mouth 2 (two) times daily.  180 tablet  3  . pantoprazole (PROTONIX) 40 MG tablet TAKE 1 TABLET BY MOUTH EVERY DAY  90 tablet  2  . sodium chloride (OCEAN) 0.65 % nasal spray Place 2 sprays into the nose 2 (two) times daily as needed. Allergies       . tamsulosin (FLOMAX) 0.4 MG CAPS capsule Take 0.4 mg by mouth daily.       No current facility-administered medications for this visit.     Past Medical History  Diagnosis Date  . Elevated PSA   . Hyperlipidemia   . HYPERLIPIDEMIA 05/25/2009  . BINGE EATING DISORDER 05/25/2009  . Chest pain 03/05/2011  . Obesity   . BPH (benign prostatic hyperplasia)   . Gastric ulcer   . CAD (coronary artery disease)   . Symptoms  involving urinary system   . Urgency of urination   . Left inguinal hernia   . History of nephrolithiasis   . Angina     none since CABG  . Dysrhythmia     atrial fib post Cabg/ 5 days Coumadin only  . Chronic kidney disease     kidney stones years ago  . Blood transfusion     6/12  following GI Bleed with coumadin therapy  . Atrial fibrillation     Past Surgical History  Procedure Laterality Date  . Polypectomy      x 2  . Stomach surgery  04/12/11    cauterization of bleeding ulcer  and artery in duodenum and stomach   . Wisdom tooth extraction    . Coronary artery bypass graft  03/27/2011    x3, Dr Tharon Aquas Trigt    . Cardiac catheterization      6/12  . Inguinal hernia repair  11/06/2011    Procedure: HERNIA REPAIR INGUINAL ADULT;  Surgeon: Earnstine Regal, MD;  Location: [facility];  Service: General;  Laterality: N/A;  Repair left inguinal hernia with mesh    History   Social History  . Marital Status: Married    Spouse Name: N/A    Number of Children: 4  . Years of Education: N/A   Occupational History  . retired     Social History Main Topics  . Smoking status:  Former Smoker -- 1.00 packs/day for 25 years    Quit date: 03/04/1988  . Smokeless tobacco: Never Used  . Alcohol Use: 0.6 oz/week    1 Cans of beer per week     Comment: occ  . Drug Use: No  . Sexual Activity: Not on file   Other Topics Concern  . Not on file   Social History Narrative   Regular exercise- yes     ROS: no fevers or chills, productive cough, hemoptysis, dysphasia, odynophagia, melena, hematochezia, dysuria, hematuria, rash, seizure activity, orthopnea, PND, pedal edema, claudication. Remaining systems are negative.  Physical Exam: Well-developed well-nourished in no acute distress.  Skin is warm and dry.  HEENT is normal.  Neck is supple.  Chest is clear to auscultation with normal expansion.  Cardiovascular exam is regular rate and rhythm.  Abdominal exam nontender or  distended. No masses palpated. Extremities show no edema. neuro grossly intact  ECG sinus rhythm at a rate of 78. Occasional PAC. No ST changes.

## 2013-11-16 NOTE — Assessment & Plan Note (Signed)
Continue statin. 

## 2013-11-16 NOTE — Assessment & Plan Note (Signed)
Continue statin. Not on aspirin and need for anticoagulation.

## 2013-11-16 NOTE — Assessment & Plan Note (Signed)
Patient had another bout of atrial fibrillation but occurred in the setting of URI and decongestants. Continue metoprolol and anticoagulation. We again discussed options today. Given frequency of events it would be worth considering an antiarrhythmic such as tikosyn. He would like to continue without present therapy for now. I will see him back in 3 months and if he continues to have episodes we will begin an antiarrhythmic at that point.

## 2013-11-17 ENCOUNTER — Encounter: Payer: Self-pay | Admitting: Cardiology

## 2013-12-03 ENCOUNTER — Encounter: Payer: Self-pay | Admitting: Internal Medicine

## 2013-12-08 ENCOUNTER — Other Ambulatory Visit: Payer: Self-pay | Admitting: Cardiology

## 2013-12-08 ENCOUNTER — Ambulatory Visit: Payer: Self-pay | Admitting: Internal Medicine

## 2013-12-11 ENCOUNTER — Encounter: Payer: Self-pay | Admitting: Internal Medicine

## 2013-12-11 ENCOUNTER — Ambulatory Visit (INDEPENDENT_AMBULATORY_CARE_PROVIDER_SITE_OTHER): Payer: Medicare Other | Admitting: Internal Medicine

## 2013-12-11 ENCOUNTER — Ambulatory Visit (INDEPENDENT_AMBULATORY_CARE_PROVIDER_SITE_OTHER)
Admission: RE | Admit: 2013-12-11 | Discharge: 2013-12-11 | Disposition: A | Discharge status: Home / Self Care | Payer: Medicare Other | Source: Ambulatory Visit | Attending: Internal Medicine | Admitting: Internal Medicine

## 2013-12-11 ENCOUNTER — Other Ambulatory Visit (INDEPENDENT_AMBULATORY_CARE_PROVIDER_SITE_OTHER): Payer: Medicare Other

## 2013-12-11 VITALS — BP 100/58 | HR 78 | Temp 98.7°F | Wt 194.4 lb

## 2013-12-11 DIAGNOSIS — L738 Other specified follicular disorders: Secondary | ICD-10-CM

## 2013-12-11 DIAGNOSIS — L821 Other seborrheic keratosis: Secondary | ICD-10-CM

## 2013-12-11 DIAGNOSIS — L678 Other hair color and hair shaft abnormalities: Secondary | ICD-10-CM

## 2013-12-11 DIAGNOSIS — F509 Eating disorder, unspecified: Secondary | ICD-10-CM

## 2013-12-11 DIAGNOSIS — L739 Follicular disorder, unspecified: Secondary | ICD-10-CM

## 2013-12-11 DIAGNOSIS — R7309 Other abnormal glucose: Secondary | ICD-10-CM

## 2013-12-11 DIAGNOSIS — R61 Generalized hyperhidrosis: Secondary | ICD-10-CM

## 2013-12-11 LAB — SEDIMENTATION RATE: Sed Rate: 10 mm/hr (ref 0–22)

## 2013-12-11 LAB — HEMOGLOBIN A1C: Hgb A1c MFr Bld: 5.9 % (ref 4.6–6.5)

## 2013-12-11 LAB — CBC WITH DIFFERENTIAL/PLATELET
Basophils Absolute: 0.1 10*3/uL (ref 0.0–0.1)
Basophils Relative: 1 % (ref 0.0–3.0)
EOS ABS: 0.3 10*3/uL (ref 0.0–0.7)
Eosinophils Relative: 2.2 % (ref 0.0–5.0)
HEMATOCRIT: 44.4 % (ref 39.0–52.0)
Hemoglobin: 14.6 g/dL (ref 13.0–17.0)
LYMPHS ABS: 2.9 10*3/uL (ref 0.7–4.0)
Lymphocytes Relative: 26 % (ref 12.0–46.0)
MCHC: 32.9 g/dL (ref 30.0–36.0)
MCV: 97.1 fl (ref 78.0–100.0)
Monocytes Absolute: 1.4 10*3/uL — ABNORMAL HIGH (ref 0.1–1.0)
Monocytes Relative: 12 % (ref 3.0–12.0)
NEUTROS PCT: 58.8 % (ref 43.0–77.0)
Neutro Abs: 6.7 10*3/uL (ref 1.4–7.7)
Platelets: 229 10*3/uL (ref 150.0–400.0)
RBC: 4.58 Mil/uL (ref 4.22–5.81)
RDW: 13.3 % (ref 11.5–14.6)
WBC: 11.3 10*3/uL — ABNORMAL HIGH (ref 4.5–10.5)

## 2013-12-11 LAB — TSH: TSH: 0.86 u[IU]/mL (ref 0.35–5.50)

## 2013-12-11 MED ORDER — LORCASERIN HCL 10 MG PO TABS: 10.0000 mg | ORAL_TABLET | Freq: Two times a day (BID) | ORAL | Status: DC

## 2013-12-11 NOTE — Progress Notes (Signed)
   Subjective:    Patient ID: Blake [name], male    DOB: [birth year], 68 y.o.   MRN: [id number]  HPI  He began having night sweats approximately 3 months ago; this was manifested as diaphoresis around the neck , shoulders and anterior chest. Initially it was infrequent but now is occurring every night. He will change his shirt approximately every  2-3 hours.  He has also noted some itching and rash around his neck. He has some skin lesions which are described as moles which are also pruritic  He has not initiated any treatment for this sweating.      Review of Systems  He specifically is unaware of any fever, chills, or weight loss.  He did have a recent lower respiratory tract infection but does not have a chronic cough or sputum production. He also denies hemoptysis  He's had no abdominal pain, constipation, or diarrhea  He denies dysuria, pyuria or hematuria.  He does have some difficulty voiding because of his prostatic hypertrophy.  He still describes difficulty controlling his compulsive eating despite known CAD. He questions the availability of any new medication option. He also requests diabetes screening.  He is requesting referral to dermatologist to assess the moles         Objective:   Physical Exam  Gen.: well-nourished in appearance. Weight excess. Alert, appropriate and cooperative throughout exam.  Head: Normocephalic without obvious abnormalities  Eyes: No corneal or conjunctival inflammation noted.  Nose: External nasal exam reveals no deformity or inflammation. Nasal mucosa are pink and moist. No lesions or exudates noted.   Mouth: Oral mucosa and oropharynx reveal no lesions or exudates. Teeth in good repair. Neck: No deformities, masses, or tenderness noted. Thyroid normal. Lungs: Normal respiratory effort; chest expands symmetrically. Lungs are clear to auscultation without rales, wheezes, or increased work of breathing. Heart: Normal rate and  rhythm. Normal S1 and S2. No gallop, click, or rub. S4 w/o murmur. Abdomen: Bowel sounds normal; abdomen soft and nontender. No masses, organomegaly or hernias noted.                                Musculoskeletal/extremities:  Accentuated curvature of mid thoracic spine.  No clubbing, cyanosis, edema, or significant extremity  deformity noted. Range of motion normal .Tone & strength normal. Hand joints normal  Fingernail health good. Able to lie down & sit up w/o help. Negative SLR bilaterally Vascular: Carotid, radial artery, dorsalis pedis and  posterior tibial pulses are full and equal. No bruits present. Neurologic: Alert and oriented x3.  Skin: Scattered benign-appearing keratoses of the trunk. Many cherry angiomata. Folliculitis type lesions over the upper thorax. Lymph: No cervical, axillary lymphadenopathy present. Psych: Mood and affect are normal. Normally interactive                                                                                        Assessment & Plan:  #1 night sweats  #2 compulsive eating  #3 keratoses  #4 folliculitis from #1  See orders

## 2013-12-11 NOTE — Patient Instructions (Signed)
Your next office appointment will be determined based upon review of your pending labs & x-rays. Those instructions will be transmitted to you through My Chart   Followup as needed for your acute issue. Please report any significant change in your symptoms. 

## 2013-12-11 NOTE — Progress Notes (Signed)
Pre visit review using our clinic review tool, if applicable. No additional management support is needed unless otherwise documented below in the visit note. 

## 2013-12-16 ENCOUNTER — Encounter: Payer: Self-pay | Admitting: Internal Medicine

## 2013-12-16 ENCOUNTER — Ambulatory Visit (INDEPENDENT_AMBULATORY_CARE_PROVIDER_SITE_OTHER): Payer: Medicare Other | Admitting: Internal Medicine

## 2013-12-16 VITALS — BP 110/70 | HR 66 | Temp 98.3°F | Wt 193.0 lb

## 2013-12-16 DIAGNOSIS — R9389 Abnormal findings on diagnostic imaging of other specified body structures: Secondary | ICD-10-CM | POA: Insufficient documentation

## 2013-12-16 DIAGNOSIS — R918 Other nonspecific abnormal finding of lung field: Secondary | ICD-10-CM

## 2013-12-16 NOTE — Assessment & Plan Note (Signed)
CT scan of chest

## 2013-12-16 NOTE — Progress Notes (Signed)
   Subjective:    Patient ID: Blake [name], male    DOB: 1946/08/10, 68 y.o.   MRN: [id number]  HPI   He returns to review his chest x-ray; it was interpreted as being normal. I had questions concerning possible left lower lobe changes upon review of serial chest x-rays.  Chest x-ray was done because of chronic, recurrent night sweats    Review of Systems  He denies associated fever, chills, unexplained weight loss, cough, sputum production, or hemoptysis.     Objective:   Physical Exam  He appears healthy and well-nourished in no distress  He has a regular rhythm without murmur  Chest is clear to auscultation with no rhonchi, rales, or rubs. There is good diaphragmatic excursion on the left.  He has no lymphadenopathy about the neck or axilla.  He has no organomegaly or masses        Assessment & Plan:  #1 abnormal chest x-ray with blurring of the left mid diaphragm on the AP view  #2 night sweats  Plan: CT scan of the lung

## 2013-12-16 NOTE — Progress Notes (Signed)
Pre visit review using our clinic review tool, if applicable. No additional management support is needed unless otherwise documented below in the visit note. 

## 2013-12-16 NOTE — Patient Instructions (Signed)
Your next office appointment will be determined based upon review of your pending CT scan. Those instructions will be transmitted to you through My Chart .

## 2013-12-22 ENCOUNTER — Ambulatory Visit (INDEPENDENT_AMBULATORY_CARE_PROVIDER_SITE_OTHER)
Admission: RE | Admit: 2013-12-22 | Discharge: 2013-12-22 | Disposition: A | Discharge status: Home / Self Care | Payer: Medicare Other | Source: Ambulatory Visit | Attending: Internal Medicine | Admitting: Internal Medicine

## 2013-12-22 DIAGNOSIS — R9389 Abnormal findings on diagnostic imaging of other specified body structures: Secondary | ICD-10-CM

## 2013-12-22 DIAGNOSIS — R918 Other nonspecific abnormal finding of lung field: Secondary | ICD-10-CM

## 2014-01-19 ENCOUNTER — Telehealth: Payer: Self-pay | Admitting: *Deleted

## 2014-01-19 NOTE — Telephone Encounter (Signed)
Received form from [facility] for tier exception on ELIQUIS, completed and faxed back.

## 2014-01-21 NOTE — Telephone Encounter (Signed)
Call from [facility], approved a tier reduction on ELIQUIS from tier 4 to tier 3, they will notify patient.

## 2014-03-11 ENCOUNTER — Other Ambulatory Visit: Payer: Self-pay | Admitting: *Deleted

## 2014-03-11 MED ORDER — PANTOPRAZOLE SODIUM 40 MG PO TBEC: DELAYED_RELEASE_TABLET | ORAL | Status: DC

## 2014-04-13 ENCOUNTER — Other Ambulatory Visit: Payer: Self-pay | Admitting: *Deleted

## 2014-04-13 MED ORDER — PANTOPRAZOLE SODIUM 40 MG PO TBEC: DELAYED_RELEASE_TABLET | ORAL | Status: DC

## 2014-06-01 ENCOUNTER — Other Ambulatory Visit: Payer: Self-pay

## 2014-06-01 MED ORDER — ATORVASTATIN CALCIUM 80 MG PO TABS: 80.0000 mg | ORAL_TABLET | Freq: Every day | ORAL | Status: DC

## 2014-06-08 ENCOUNTER — Other Ambulatory Visit: Payer: Self-pay | Admitting: *Deleted

## 2014-06-08 MED ORDER — APIXABAN 5 MG PO TABS: 5.0000 mg | ORAL_TABLET | Freq: Two times a day (BID) | ORAL | Status: DC

## 2014-06-10 ENCOUNTER — Ambulatory Visit (INDEPENDENT_AMBULATORY_CARE_PROVIDER_SITE_OTHER): Payer: Medicare Other | Admitting: Cardiology

## 2014-06-10 ENCOUNTER — Encounter: Payer: Self-pay | Admitting: Cardiology

## 2014-06-10 VITALS — BP 114/62 | HR 72 | Ht 66.0 in | Wt 184.0 lb

## 2014-06-10 DIAGNOSIS — K259 Gastric ulcer, unspecified as acute or chronic, without hemorrhage or perforation: Secondary | ICD-10-CM

## 2014-06-10 DIAGNOSIS — I4891 Unspecified atrial fibrillation: Secondary | ICD-10-CM

## 2014-06-10 DIAGNOSIS — I480 Paroxysmal atrial fibrillation: Secondary | ICD-10-CM

## 2014-06-10 DIAGNOSIS — I2510 Atherosclerotic heart disease of native coronary artery without angina pectoris: Secondary | ICD-10-CM

## 2014-06-10 DIAGNOSIS — E785 Hyperlipidemia, unspecified: Secondary | ICD-10-CM

## 2014-06-10 LAB — CBC
HCT: 44.2 % (ref 39.0–52.0)
HEMOGLOBIN: 15.3 g/dL (ref 13.0–17.0)
MCH: 32.5 pg (ref 26.0–34.0)
MCHC: 34.6 g/dL (ref 30.0–36.0)
MCV: 93.8 fL (ref 78.0–100.0)
Platelets: 261 10*3/uL (ref 150–400)
RBC: 4.71 MIL/uL (ref 4.22–5.81)
RDW: 13.4 % (ref 11.5–15.5)
WBC: 9.7 10*3/uL (ref 4.0–10.5)

## 2014-06-10 LAB — HEPATIC FUNCTION PANEL
ALK PHOS: 69 U/L (ref 39–117)
ALT: 15 U/L (ref 0–53)
AST: 18 U/L (ref 0–37)
Albumin: 4.5 g/dL (ref 3.5–5.2)
BILIRUBIN DIRECT: 0.3 mg/dL (ref 0.0–0.3)
BILIRUBIN INDIRECT: 0.9 mg/dL (ref 0.2–1.2)
Total Bilirubin: 1.2 mg/dL (ref 0.2–1.2)
Total Protein: 6.9 g/dL (ref 6.0–8.3)

## 2014-06-10 LAB — BASIC METABOLIC PANEL WITH GFR
BUN: 13 mg/dL (ref 6–23)
CO2: 24 mEq/L (ref 19–32)
Calcium: 9.4 mg/dL (ref 8.4–10.5)
Chloride: 104 mEq/L (ref 96–112)
Creat: 0.69 mg/dL (ref 0.50–1.35)
GFR, Est African American: >89 mL/min
GFR, Est Non African American: >89 mL/min
GLUCOSE: 94 mg/dL (ref 70–99)
POTASSIUM: 4.7 meq/L (ref 3.5–5.3)
Sodium: 139 mEq/L (ref 135–145)

## 2014-06-10 LAB — LIPID PANEL
CHOLESTEROL: 94 mg/dL (ref 0–200)
HDL: 35 mg/dL — ABNORMAL LOW (ref >39)
LDL Cholesterol: 46 mg/dL (ref 0–99)
Total CHOL/HDL Ratio: 2.7 Ratio
Triglycerides: 64 mg/dL (ref <150)
VLDL: 13 mg/dL (ref 0–40)

## 2014-06-10 MED ORDER — APIXABAN 5 MG PO TABS: 5.0000 mg | ORAL_TABLET | Freq: Two times a day (BID) | ORAL | Status: DC

## 2014-06-10 MED ORDER — METOPROLOL TARTRATE 50 MG PO TABS: 50.0000 mg | ORAL_TABLET | Freq: Two times a day (BID) | ORAL | Status: DC

## 2014-06-10 MED ORDER — ATORVASTATIN CALCIUM 80 MG PO TABS: 80.0000 mg | ORAL_TABLET | Freq: Every day | ORAL | Status: DC

## 2014-06-10 NOTE — Patient Instructions (Signed)
Your physician wants you to follow-up in: [place] will receive a reminder letter in the mail two months in advance. If you don't receive a letter, please call our office to schedule the follow-up appointment.   Your physician recommends that you HAVE LAB [place]

## 2014-06-10 NOTE — Assessment & Plan Note (Signed)
Continue statin. Check lipids and liver. 

## 2014-06-10 NOTE — Assessment & Plan Note (Signed)
Patient had ulcer following bypass surgery. He has been on chronic Protonix. Discontinue and follow.

## 2014-06-10 NOTE — Progress Notes (Signed)
HPI: Followup coronary artery disease and atrial fibrillation. Abdominal CT in 2012 showed no aneurysm. Patient had coronary artery bypassing graft in 2012 with a LIMA to the LAD, saphenous vein graft to the first diagonal and saphenous vein graft to the acute marginal. Patient did have postoperative atrial fibrillation which resolved. Note preoperative LV function was normal. Monitor in October of 2014 showed paroxysmal atrial fibrillation. Nuclear study in October 2014 showed an ejection fraction of 63% and normal perfusion. Echocardiogram in December of 2014 showed normal LV function, mild left ventricular hypertrophy, moderate to severe left atrial enlargement. Last seen 1/15. Since then, the patient denies any dyspnea on exertion, orthopnea, PND, pedal edema, palpitations, syncope or chest pain.    Current Outpatient Prescriptions  Medication Sig Dispense Refill  . apixaban (ELIQUIS) 5 MG TABS tablet Take 1 tablet (5 mg total) by mouth 2 (two) times daily.  180 tablet  0  . atorvastatin (LIPITOR) 80 MG tablet Take 1 tablet (80 mg total) by mouth daily.  30 tablet  6  . metoprolol tartrate (LOPRESSOR) 50 MG tablet Take 1 tablet (50 mg total) by mouth 2 (two) times daily.  180 tablet  3  . pantoprazole (PROTONIX) 40 MG tablet TAKE 1 TABLET BY MOUTH EVERY DAY  30 tablet  0  . sodium chloride (OCEAN) 0.65 % nasal spray Place 2 sprays into the nose 2 (two) times daily as needed. Allergies        No current facility-administered medications for this visit.     Past Medical History  Diagnosis Date  . Elevated PSA   . Hyperlipidemia   . HYPERLIPIDEMIA 05/25/2009  . BINGE EATING DISORDER 05/25/2009  . Chest pain 03/05/2011  . Obesity   . BPH (benign prostatic hyperplasia)   . Gastric ulcer   . CAD (coronary artery disease)   . Symptoms involving urinary system   . Urgency of urination   . Left inguinal hernia   . History of nephrolithiasis   . Angina     none since CABG  .  Dysrhythmia     atrial fib post Cabg/ 5 days Coumadin only  . Chronic kidney disease     kidney stones years ago  . Blood transfusion     6/12  following GI Bleed with coumadin therapy  . Atrial fibrillation     Past Surgical History  Procedure Laterality Date  . Polypectomy      x 2  . Stomach surgery  04/12/11    cauterization of bleeding ulcer  and artery in duodenum and stomach   . Wisdom tooth extraction    . Coronary artery bypass graft  03/27/2011    x3, Dr Tharon Aquas Trigt    . Cardiac catheterization      6/12  . Inguinal hernia repair  11/06/2011    Procedure: HERNIA REPAIR INGUINAL ADULT;  Surgeon: Earnstine Regal, MD;  Location: [facility];  Service: General;  Laterality: N/A;  Repair left inguinal hernia with mesh    History   Social History  . Marital Status: Married    Spouse Name: N/A    Number of Children: 4  . Years of Education: N/A   Occupational History  . retired     Social History Main Topics  . Smoking status: Former Smoker -- 1.00 packs/day for 25 years    Quit date: 03/04/1988  . Smokeless tobacco: Never Used  . Alcohol Use: 0.6 oz/week    1 Cans  of beer per week     Comment: occ  . Drug Use: No  . Sexual Activity: Not on file   Other Topics Concern  . Not on file   Social History Narrative   Regular exercise- yes     ROS: no fevers or chills, productive cough, hemoptysis, dysphasia, odynophagia, melena, hematochezia, dysuria, hematuria, rash, seizure activity, orthopnea, PND, pedal edema, claudication. Remaining systems are negative.  Physical Exam: Well-developed well-nourished in no acute distress.  Skin is warm and dry.  HEENT is normal.  Neck is supple.  Chest is clear to auscultation with normal expansion.  Cardiovascular exam is regular rate and rhythm.  Abdominal exam nontender or distended. No masses palpated. Extremities show no edema. neuro grossly intact  ECG Sinus rhythm with PACs. No ST changes.

## 2014-06-10 NOTE — Assessment & Plan Note (Signed)
Continue Statin. Not on aspirin given need for anticoagulation.

## 2014-06-10 NOTE — Assessment & Plan Note (Signed)
Patient in sinus rhythm.Continue apixaban. Check hemoglobin and renal function.

## 2014-06-11 ENCOUNTER — Encounter: Payer: Self-pay | Admitting: *Deleted

## 2014-12-08 ENCOUNTER — Ambulatory Visit (INDEPENDENT_AMBULATORY_CARE_PROVIDER_SITE_OTHER): Payer: Medicare Other | Admitting: Internal Medicine

## 2014-12-08 ENCOUNTER — Encounter: Payer: Self-pay | Admitting: Internal Medicine

## 2014-12-08 VITALS — BP 126/80 | HR 96 | Temp 98.3°F | Ht 66.0 in | Wt 202.5 lb

## 2014-12-08 DIAGNOSIS — M48062 Spinal stenosis, lumbar region with neurogenic claudication: Secondary | ICD-10-CM

## 2014-12-08 DIAGNOSIS — M4806 Spinal stenosis, lumbar region: Secondary | ICD-10-CM

## 2014-12-08 MED ORDER — GABAPENTIN 100 MG PO CAPS: ORAL_CAPSULE | ORAL | Status: DC

## 2014-12-08 NOTE — Progress Notes (Signed)
Pre visit review using our clinic review tool, if applicable. No additional management support is needed unless otherwise documented below in the visit note. 

## 2014-12-08 NOTE — Patient Instructions (Signed)
Assess response to the gabapentin @ bedtime as needed. If it is partially beneficial, it can be increased up to a total of 3 pills s as needed. This increase of 1 pill each dose  should take place over 72 hours at least.

## 2014-12-08 NOTE — Progress Notes (Signed)
   Subjective:    Patient ID: Blake [name], male    DOB: 12/18/1945, 69 y.o.   MRN: [id number]  HPI  He has had intermittent left lower extremity pain for the last 15-20 years. He does not remember any initial trigger or injury.  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 He describes it as aching pain up to level VI-VII from the left hip to the left foot. This does last 6-7 hours. Tylenol is been of minimal benefit. He has had some improvement if he sleeps prone with the left knee flexed.  Review of Systems He denies constitutional symptoms of fever, chills, sweats, weight change  He has no weakness, numbness, tingling of the lower extremity.  There's been no associated loss of control of bladder or bowels.  He also has had a sore area over the left distal sole which is worse with weightbearing.  He also has a past history of plantar fasciitis: This isn't active at this time.    Objective:   Physical Exam  Pertinent or positive findings include: Atrial fibrillation is present Deep tendon reflexes are slightly asymmetric.  1.5+@ the left knee and 1+ on the right. Gait to include heel and toe walking are normal.. Straight leg raising is negative. He has excellent range of motion lower extremities  Strength and tone are normal in all extremities. There appears to be a plantar wart over the left distal sole laterally.  General appearance :adequately nourished; in no distress. Eyes: No conjunctival inflammation or scleral icterus is present. Lungs:Chest clear to auscultation; no wheezes, rhonchi,rales ,or rubs present.No increased work of breathing.  Abdomen: bowel sounds normal, soft and non-tender without masses, organomegaly or hernias noted.  No guarding or rebound. No flank tenderness to percussion. Vascular : all pulses equal ; no bruits present. Skin:Warm & dry.  Intact without suspicious lesions or rashes ; no jaundice or  tenting Lymphatic: No lymphadenopathy is noted about the head, neck, axilla         Assessment & Plan:  #1 spinal stenosis with neurogenic claudication induced by prolonged standing without position change  Plan: Ergonomics were discussed.  Gabapentin 100 milligrams at bedtime with titration as needed was also prescribed.  If symptoms persist; imaging will be pursued   #2 plantar wart; Podiatry F/U recommended

## 2015-03-04 ENCOUNTER — Other Ambulatory Visit: Payer: Self-pay

## 2015-03-10 ENCOUNTER — Other Ambulatory Visit: Payer: Self-pay

## 2015-03-10 MED ORDER — ATORVASTATIN CALCIUM 80 MG PO TABS: 80.0000 mg | ORAL_TABLET | Freq: Every day | ORAL | Status: DC

## 2015-04-18 ENCOUNTER — Other Ambulatory Visit: Payer: Self-pay

## 2015-07-27 ENCOUNTER — Telehealth: Payer: Self-pay | Admitting: Emergency Medicine

## 2015-07-27 NOTE — Telephone Encounter (Signed)
Refill request for Gabapentin #90. Pts last OV 2/16. Please advise

## 2015-07-27 NOTE — Telephone Encounter (Signed)
OK x 1 

## 2015-07-28 ENCOUNTER — Other Ambulatory Visit: Payer: Self-pay | Admitting: Emergency Medicine

## 2015-07-28 DIAGNOSIS — M48062 Spinal stenosis, lumbar region with neurogenic claudication: Secondary | ICD-10-CM

## 2015-07-28 MED ORDER — GABAPENTIN 100 MG PO CAPS: ORAL_CAPSULE | ORAL | Status: DC

## 2015-08-04 ENCOUNTER — Other Ambulatory Visit: Payer: Self-pay | Admitting: Cardiology

## 2015-08-04 DIAGNOSIS — I4891 Unspecified atrial fibrillation: Secondary | ICD-10-CM

## 2015-08-04 DIAGNOSIS — I480 Paroxysmal atrial fibrillation: Secondary | ICD-10-CM

## 2015-08-04 DIAGNOSIS — K259 Gastric ulcer, unspecified as acute or chronic, without hemorrhage or perforation: Secondary | ICD-10-CM

## 2015-08-04 DIAGNOSIS — I2510 Atherosclerotic heart disease of native coronary artery without angina pectoris: Secondary | ICD-10-CM

## 2015-08-04 MED ORDER — APIXABAN 5 MG PO TABS: 5.0000 mg | ORAL_TABLET | Freq: Two times a day (BID) | ORAL | Status: DC

## 2015-08-12 ENCOUNTER — Other Ambulatory Visit: Payer: Self-pay

## 2015-08-12 DIAGNOSIS — I480 Paroxysmal atrial fibrillation: Secondary | ICD-10-CM

## 2015-08-12 DIAGNOSIS — K259 Gastric ulcer, unspecified as acute or chronic, without hemorrhage or perforation: Secondary | ICD-10-CM

## 2015-08-12 DIAGNOSIS — R002 Palpitations: Secondary | ICD-10-CM

## 2015-08-12 DIAGNOSIS — I4891 Unspecified atrial fibrillation: Secondary | ICD-10-CM

## 2015-08-12 DIAGNOSIS — I2510 Atherosclerotic heart disease of native coronary artery without angina pectoris: Secondary | ICD-10-CM

## 2015-08-12 MED ORDER — METOPROLOL TARTRATE 50 MG PO TABS: 50.0000 mg | ORAL_TABLET | Freq: Two times a day (BID) | ORAL | Status: DC

## 2015-08-15 ENCOUNTER — Encounter: Payer: Self-pay | Admitting: Internal Medicine

## 2015-08-17 ENCOUNTER — Telehealth: Payer: Self-pay | Admitting: *Deleted

## 2015-08-17 MED ORDER — GABAPENTIN 300 MG PO CAPS: 300.0000 mg | ORAL_CAPSULE | Freq: Every day | ORAL | Status: DC

## 2015-08-17 NOTE — Telephone Encounter (Signed)
Pt sent md email miss and done email & it went away. Retreive email see md response below   Last seen 2/16     Can increase Gabapentin 300 mg 1 pill q 8 hrs     OV if no better     ----- Message -----     From: Earnstine Regal, MA     Sent: 08/15/2015  5:00 PM      To: Hendricks Limes, MD    Subject: Melton Alar: Visit Follow-Up Question                         ----- Message -----     From: [name]     Sent: 08/15/2015  1:23 PM      To: [facility]    Subject: Visit Follow-Up Question                   Saw Dr. Linna Darner recently, I was experiencing moderate pain in my left leg on a continuing basis. told me I have spinal stenosis/ prescribed gabapentin. label says at bed time, one-two capsule For while medication was helpful. Recently the pain has increased significantly in intensity and I experience it every night (while lying down). I also experience it during the day in moderate to strong intensity, depending on my level of activity (working in [employer], yard work, Social research officer, government.) I take two caps along w/ two 200 mg ibuprofen at bed time. It seems to have little to no effect on pain. Ibuprofen does seem to help some, when I used acetaminophen it had no effectl. I can get relief only by sleeping in a sitting position and sleep only about 5 hours a night. Do I need to see Dr Linna Darner; should I increase the dosage of the gabapentin; is there other medication recommended; other suggested therapy, what activities should be curtailed, etc. Thank you for your attention to this matter.        Gilles Chiquito

## 2015-08-19 ENCOUNTER — Encounter: Payer: Self-pay | Admitting: Internal Medicine

## 2015-08-31 ENCOUNTER — Other Ambulatory Visit: Payer: Self-pay | Admitting: Cardiology

## 2015-08-31 DIAGNOSIS — I2510 Atherosclerotic heart disease of native coronary artery without angina pectoris: Secondary | ICD-10-CM

## 2015-08-31 DIAGNOSIS — K259 Gastric ulcer, unspecified as acute or chronic, without hemorrhage or perforation: Secondary | ICD-10-CM

## 2015-08-31 DIAGNOSIS — I480 Paroxysmal atrial fibrillation: Secondary | ICD-10-CM

## 2015-08-31 DIAGNOSIS — I4891 Unspecified atrial fibrillation: Secondary | ICD-10-CM

## 2015-08-31 MED ORDER — APIXABAN 5 MG PO TABS
5.0000 mg | ORAL_TABLET | Freq: Two times a day (BID) | ORAL | Status: DC
Start: 2015-08-31 — End: 2015-09-05

## 2015-09-05 ENCOUNTER — Encounter: Payer: Self-pay | Admitting: Physician Assistant

## 2015-09-05 ENCOUNTER — Ambulatory Visit (INDEPENDENT_AMBULATORY_CARE_PROVIDER_SITE_OTHER): Payer: Medicare Other | Admitting: Physician Assistant

## 2015-09-05 VITALS — BP 140/70 | HR 94 | Ht 66.0 in | Wt 206.2 lb

## 2015-09-05 DIAGNOSIS — I2510 Atherosclerotic heart disease of native coronary artery without angina pectoris: Secondary | ICD-10-CM

## 2015-09-05 DIAGNOSIS — K259 Gastric ulcer, unspecified as acute or chronic, without hemorrhage or perforation: Secondary | ICD-10-CM

## 2015-09-05 DIAGNOSIS — I4891 Unspecified atrial fibrillation: Secondary | ICD-10-CM

## 2015-09-05 DIAGNOSIS — R002 Palpitations: Secondary | ICD-10-CM

## 2015-09-05 DIAGNOSIS — E669 Obesity, unspecified: Secondary | ICD-10-CM

## 2015-09-05 DIAGNOSIS — I480 Paroxysmal atrial fibrillation: Secondary | ICD-10-CM | POA: Diagnosis not present

## 2015-09-05 DIAGNOSIS — E785 Hyperlipidemia, unspecified: Secondary | ICD-10-CM

## 2015-09-05 MED ORDER — APIXABAN 5 MG PO TABS: 5.0000 mg | ORAL_TABLET | Freq: Two times a day (BID) | ORAL | Status: DC

## 2015-09-05 MED ORDER — ATORVASTATIN CALCIUM 80 MG PO TABS: 80.0000 mg | ORAL_TABLET | Freq: Every day | ORAL | Status: DC

## 2015-09-05 MED ORDER — METOPROLOL TARTRATE 50 MG PO TABS: 50.0000 mg | ORAL_TABLET | Freq: Two times a day (BID) | ORAL | Status: DC

## 2015-09-05 NOTE — Patient Instructions (Signed)
Your physician wants you to follow-up in: 1 Year. You will receive a reminder letter in the mail two months in advance. If you don't receive a letter, please call our office to schedule the follow-up appointment.  If you need a refill on your cardiac medications before your next appointment, please call your pharmacy.   

## 2015-09-05 NOTE — Progress Notes (Signed)
Patient ID: Blake [name], male   DOB: August 30, 1946, 69 y.o.   MRN: [id number]    Date:  09/05/2015   ID:  Blake [name], Blake [name] [birth year], MRN [id number]  PCP:  Unice Cobble, MD  Primary Cardiologist:  Stanford Breed  Chief complaint: Annual visit   History of Present Illness: Blake [name] is a 69 y.o. male  with a history of coronary artery disease, coronary artery bypass grafting 2012 atrial fibrillation, obesity, hyperlipidemia, chronic kidney disease.  Abdominal CT in 2012 showed no aneurysm. Patient had coronary artery bypassing graft in 2012 with a LIMA to the LAD, saphenous vein graft to the first diagonal and saphenous vein graft to the acute marginal. He did have postoperative atrial fibrillation which resolved. Note preoperative LV function was normal. Monitor in October of 2014 showed paroxysmal atrial fibrillation. Nuclear study in October 2014 showed an ejection fraction of 63% and normal perfusion. Echocardiogram in December of 2014 showed normal LV function, mild left ventricular hypertrophy, moderate to severe left atrial enlargement.  Patient presents for posthospital follow-up.  His last office visit in August 2015  CMP which was within normal limits. Lipid panel below. CBC within normal limits.   Patient presents for one-year follow-up.  All the refills ran out on his medications and he needed to follow-up before getting refills.   Patient has no complaints other than his weight continues to go up. He reports he eats really well up until about 7:00 and then was watching television he starts eating a lot of junk. Is also not exercising.  The patient currently denies nausea, vomiting, fever, chest pain, shortness of breath, orthopnea, dizziness, PND, cough, congestion, abdominal pain, hematochezia, melena, lower extremity edema, claudication.  Wt Readings from Last 3 Encounters:  09/05/15 206 lb 3.2 oz (93.532 kg)  12/08/14 202 lb 8 oz (91.853 kg)  06/10/14 184 lb (83.462 kg)    Lipid Panel     Component Value Date/Time   CHOL 94 06/10/2014 0913   TRIG 64 06/10/2014 0913   HDL 35* 06/10/2014 0913   CHOLHDL 2.7 06/10/2014 0913   VLDL 13 06/10/2014 0913   LDLCALC 46 06/10/2014 0913     Past Medical History  Diagnosis Date  . Elevated PSA   . Hyperlipidemia   . HYPERLIPIDEMIA 05/25/2009  . BINGE EATING DISORDER 05/25/2009  . Chest pain 03/05/2011  . Obesity   . BPH (benign prostatic hyperplasia)   . Gastric ulcer   . CAD (coronary artery disease)   . Symptoms involving urinary system   . Urgency of urination   . Left inguinal hernia   . History of nephrolithiasis   . Angina     none since CABG  . Dysrhythmia     atrial fib post Cabg/ 5 days Coumadin only  . Chronic kidney disease     kidney stones years ago  . Blood transfusion     6/12  following GI Bleed with coumadin therapy  . Atrial fibrillation [facility])     Current Outpatient Prescriptions  Medication Sig Dispense Refill  . apixaban (ELIQUIS) 5 MG TABS tablet Take 1 tablet (5 mg total) by mouth 2 (two) times daily. 180 tablet 3  . atorvastatin (LIPITOR) 80 MG tablet Take 1 tablet (80 mg total) by mouth daily. 90 tablet 3  . gabapentin (NEURONTIN) 300 MG capsule Take 1 capsule (300 mg total) by mouth at bedtime. 30 capsule 0  . metoprolol (LOPRESSOR) 50 MG tablet Take 1 tablet (50 mg total) by  mouth 2 (two) times daily. 180 tablet 3  . tamsulosin (FLOMAX) 0.4 MG CAPS capsule Take 0.4 mg by mouth daily as needed.     No current facility-administered medications for this visit.    Allergies:   No Known Allergies  Social History:  The patient  reports that he quit smoking about 27 years ago. He has never used smokeless tobacco. He reports that he drinks about 0.6 oz of alcohol per week. He reports that he does not use illicit drugs.   Family history:   Family History  Problem Relation Age of Onset  . Coronary artery disease Father   . Heart attack Father 68  . Cancer Father   . Lymphoma      . Diabetes Sister   . Heart attack Sister 71  . Ovarian cancer Sister   . Atrial fibrillation Brother   . Colon cancer Neg Hx   . Stomach cancer Neg Hx     ROS:  Please see the history of present illness.  All other systems reviewed and negative.   PHYSICAL EXAM: VS:  BP 140/70 mmHg  Pulse 94  Ht 5\' 6"  (1.676 m)  Wt 206 lb 3.2 oz (93.532 kg)  BMI 33.30 kg/m2  obese, well developed, in no acute distress HEENT: Pupils are equal round react to light accommodation extraocular movements are intact.  Neck: no JVDNo cervical lymphadenopathy. Cardiac:  Irregular rate and rhythm without murmurs rubs or gallops. Lungs:  clear to auscultation bilaterally, no wheezing, rhonchi or rales Abd: soft, nontender, positive bowel sounds all quadrants, no hepatosplenomegaly Ext: no lower extremity edema.  2+ radial and dorsalis pedis pulses. Skin: warm and dry Neuro:  Grossly normal  EKG:   Atrial fibrillation rate 94 bpm  ASSESSMENT AND PLAN:  Problem List Items Addressed This Visit    Obesity (BMI 30-39.9)   Hyperlipidemia   Relevant Medications   apixaban (ELIQUIS) 5 MG TABS tablet   metoprolol (LOPRESSOR) 50 MG tablet   atorvastatin (LIPITOR) 80 MG tablet   CAD (coronary artery disease)   Relevant Medications   apixaban (ELIQUIS) 5 MG TABS tablet   metoprolol (LOPRESSOR) 50 MG tablet   atorvastatin (LIPITOR) 80 MG tablet   Other Relevant Orders   EKG 12-Lead   Atrial fibrillation (HCC) - Primary   Relevant Medications   apixaban (ELIQUIS) 5 MG TABS tablet   metoprolol (LOPRESSOR) 50 MG tablet   atorvastatin (LIPITOR) 80 MG tablet   Other Relevant Orders   EKG 12-Lead   EKG 12-Lead    Other Visit Diagnoses    Atrial fibrillation, unspecified        Relevant Medications    apixaban (ELIQUIS) 5 MG TABS tablet    metoprolol (LOPRESSOR) 50 MG tablet    atorvastatin (LIPITOR) 80 MG tablet    Heart palpitations        Relevant Medications    metoprolol (LOPRESSOR) 50 MG tablet        Atrial fibrillation, chronic Appears asymptomatic. Rate controlled on metoprolol 50 mg twice daily. Is also an Ellik was 5 mg twice a day. Has not missed any doses.  Coronary artery disease: No complaints of angina.  Obesity BMI 33: We discussed low carb diet and exercising daily. I suggested changing his nightly snacks to something more healthy like carrots or apples.  Essential hypertension Blood pressures mildly elevated. He says he usually runs around [id number] systolic. We discussed eating a low-sodium diet as well. He says he usually puts salt on  top of his salt. I recommended targeting under 2000 mg daily.  The exercise will help as well.  Hyperlipidemia  Continue Lipitor

## 2015-09-19 ENCOUNTER — Other Ambulatory Visit: Payer: Self-pay | Admitting: Internal Medicine

## 2015-10-04 ENCOUNTER — Other Ambulatory Visit: Payer: Self-pay | Admitting: Cardiology

## 2015-10-04 DIAGNOSIS — I480 Paroxysmal atrial fibrillation: Secondary | ICD-10-CM

## 2015-10-04 DIAGNOSIS — I2510 Atherosclerotic heart disease of native coronary artery without angina pectoris: Secondary | ICD-10-CM

## 2015-10-04 DIAGNOSIS — I4891 Unspecified atrial fibrillation: Secondary | ICD-10-CM

## 2015-10-04 DIAGNOSIS — K259 Gastric ulcer, unspecified as acute or chronic, without hemorrhage or perforation: Secondary | ICD-10-CM

## 2015-10-04 MED ORDER — APIXABAN 5 MG PO TABS: 5.0000 mg | ORAL_TABLET | Freq: Two times a day (BID) | ORAL | Status: DC

## 2015-10-10 ENCOUNTER — Encounter: Payer: Self-pay | Admitting: Internal Medicine

## 2015-10-18 ENCOUNTER — Inpatient Hospital Stay (HOSPITAL_COMMUNITY)
Admission: EM | Admit: 2015-10-18 | Discharge: 2015-10-20 | DRG: 392 | Disposition: A | Discharge status: Home / Self Care | Payer: Medicare Other | Attending: Internal Medicine | Admitting: Internal Medicine

## 2015-10-18 ENCOUNTER — Emergency Department (HOSPITAL_COMMUNITY): Payer: Medicare Other

## 2015-10-18 ENCOUNTER — Encounter (HOSPITAL_COMMUNITY): Payer: Self-pay

## 2015-10-18 DIAGNOSIS — N400 Enlarged prostate without lower urinary tract symptoms: Secondary | ICD-10-CM

## 2015-10-18 DIAGNOSIS — Z7901 Long term (current) use of anticoagulants: Secondary | ICD-10-CM | POA: Diagnosis not present

## 2015-10-18 DIAGNOSIS — Z79899 Other long term (current) drug therapy: Secondary | ICD-10-CM | POA: Diagnosis not present

## 2015-10-18 DIAGNOSIS — N189 Chronic kidney disease, unspecified: Secondary | ICD-10-CM | POA: Diagnosis present

## 2015-10-18 DIAGNOSIS — I2510 Atherosclerotic heart disease of native coronary artery without angina pectoris: Secondary | ICD-10-CM | POA: Diagnosis present

## 2015-10-18 DIAGNOSIS — Z8711 Personal history of peptic ulcer disease: Secondary | ICD-10-CM | POA: Diagnosis not present

## 2015-10-18 DIAGNOSIS — R002 Palpitations: Secondary | ICD-10-CM

## 2015-10-18 DIAGNOSIS — I482 Chronic atrial fibrillation, unspecified: Secondary | ICD-10-CM | POA: Diagnosis present

## 2015-10-18 DIAGNOSIS — F5081 Binge eating disorder: Secondary | ICD-10-CM | POA: Diagnosis present

## 2015-10-18 DIAGNOSIS — K529 Noninfective gastroenteritis and colitis, unspecified: Principal | ICD-10-CM | POA: Diagnosis present

## 2015-10-18 DIAGNOSIS — I998 Other disorder of circulatory system: Secondary | ICD-10-CM | POA: Diagnosis present

## 2015-10-18 DIAGNOSIS — R140 Abdominal distension (gaseous): Secondary | ICD-10-CM

## 2015-10-18 DIAGNOSIS — I999 Unspecified disorder of circulatory system: Secondary | ICD-10-CM | POA: Diagnosis not present

## 2015-10-18 DIAGNOSIS — R1084 Generalized abdominal pain: Secondary | ICD-10-CM

## 2015-10-18 DIAGNOSIS — Z951 Presence of aortocoronary bypass graft: Secondary | ICD-10-CM

## 2015-10-18 DIAGNOSIS — E669 Obesity, unspecified: Secondary | ICD-10-CM | POA: Diagnosis present

## 2015-10-18 DIAGNOSIS — I10 Essential (primary) hypertension: Secondary | ICD-10-CM

## 2015-10-18 DIAGNOSIS — Z87891 Personal history of nicotine dependence: Secondary | ICD-10-CM

## 2015-10-18 DIAGNOSIS — R1110 Vomiting, unspecified: Secondary | ICD-10-CM

## 2015-10-18 DIAGNOSIS — E785 Hyperlipidemia, unspecified: Secondary | ICD-10-CM | POA: Diagnosis present

## 2015-10-18 DIAGNOSIS — I879 Disorder of vein, unspecified: Secondary | ICD-10-CM | POA: Diagnosis present

## 2015-10-18 DIAGNOSIS — R109 Unspecified abdominal pain: Secondary | ICD-10-CM

## 2015-10-18 DIAGNOSIS — I2583 Coronary atherosclerosis due to lipid rich plaque: Secondary | ICD-10-CM

## 2015-10-18 DIAGNOSIS — R338 Other retention of urine: Secondary | ICD-10-CM | POA: Diagnosis present

## 2015-10-18 DIAGNOSIS — Z8249 Family history of ischemic heart disease and other diseases of the circulatory system: Secondary | ICD-10-CM

## 2015-10-18 DIAGNOSIS — N401 Enlarged prostate with lower urinary tract symptoms: Secondary | ICD-10-CM | POA: Diagnosis present

## 2015-10-18 DIAGNOSIS — E860 Dehydration: Secondary | ICD-10-CM

## 2015-10-18 DIAGNOSIS — D72829 Elevated white blood cell count, unspecified: Secondary | ICD-10-CM

## 2015-10-18 DIAGNOSIS — R079 Chest pain, unspecified: Secondary | ICD-10-CM

## 2015-10-18 DIAGNOSIS — I4891 Unspecified atrial fibrillation: Secondary | ICD-10-CM | POA: Diagnosis not present

## 2015-10-18 DIAGNOSIS — I129 Hypertensive chronic kidney disease with stage 1 through stage 4 chronic kidney disease, or unspecified chronic kidney disease: Secondary | ICD-10-CM | POA: Diagnosis present

## 2015-10-18 DIAGNOSIS — R197 Diarrhea, unspecified: Secondary | ICD-10-CM

## 2015-10-18 LAB — URINALYSIS, ROUTINE W REFLEX MICROSCOPIC
GLUCOSE, UA: NEGATIVE mg/dL
Ketones, ur: 15 mg/dL — AB
Nitrite: NEGATIVE
PROTEIN: NEGATIVE mg/dL
Specific Gravity, Urine: 1.026 (ref 1.005–1.030)
pH: 5 (ref 5.0–8.0)

## 2015-10-18 LAB — CBC
HCT: 53.4 % — ABNORMAL HIGH (ref 39.0–52.0)
Hemoglobin: 17.8 g/dL — ABNORMAL HIGH (ref 13.0–17.0)
MCH: 33.5 pg (ref 26.0–34.0)
MCHC: 33.3 g/dL (ref 30.0–36.0)
MCV: 100.4 fL — AB (ref 78.0–100.0)
PLATELETS: 278 10*3/uL (ref 150–400)
RBC: 5.32 MIL/uL (ref 4.22–5.81)
RDW: 12.8 % (ref 11.5–15.5)
WBC: 22.5 10*3/uL — ABNORMAL HIGH (ref 4.0–10.5)

## 2015-10-18 LAB — LACTIC ACID, PLASMA: Lactic Acid, Venous: 1.5 mmol/L (ref 0.5–2.0)

## 2015-10-18 LAB — COMPREHENSIVE METABOLIC PANEL
ALBUMIN: 4.9 g/dL (ref 3.5–5.0)
ALT: 24 U/L (ref 17–63)
AST: 26 U/L (ref 15–41)
Alkaline Phosphatase: 87 U/L (ref 38–126)
Anion gap: 11 (ref 5–15)
BUN: 18 mg/dL (ref 6–20)
CHLORIDE: 107 mmol/L (ref 101–111)
CO2: 23 mmol/L (ref 22–32)
CREATININE: 1.24 mg/dL (ref 0.61–1.24)
Calcium: 11.6 mg/dL — ABNORMAL HIGH (ref 8.9–10.3)
GFR calc non Af Amer: 57 mL/min — ABNORMAL LOW (ref >60)
Glucose, Bld: 140 mg/dL — ABNORMAL HIGH (ref 65–99)
Potassium: 4.8 mmol/L (ref 3.5–5.1)
SODIUM: 141 mmol/L (ref 135–145)
Total Bilirubin: 1.5 mg/dL — ABNORMAL HIGH (ref 0.3–1.2)
Total Protein: 8.8 g/dL — ABNORMAL HIGH (ref 6.5–8.1)

## 2015-10-18 LAB — URINE MICROSCOPIC-ADD ON

## 2015-10-18 LAB — I-STAT TROPONIN, ED: Troponin i, poc: 0 ng/mL (ref 0.00–0.08)

## 2015-10-18 LAB — APTT
aPTT: 31 seconds (ref 24–37)
aPTT: 84 seconds — ABNORMAL HIGH (ref 24–37)

## 2015-10-18 LAB — HEPARIN LEVEL (UNFRACTIONATED): HEPARIN UNFRACTIONATED: 0.83 [IU]/mL — AB (ref 0.30–0.70)

## 2015-10-18 LAB — LIPASE, BLOOD: LIPASE: 39 U/L (ref 11–51)

## 2015-10-18 MED ORDER — WITCH HAZEL-GLYCERIN EX PADS
MEDICATED_PAD | CUTANEOUS | Status: DC | PRN
  Filled 2015-10-18: qty 100

## 2015-10-18 MED ORDER — SODIUM CHLORIDE 0.9 % IV BOLUS (SEPSIS)
1000.0000 mL | Freq: Once | INTRAVENOUS | Status: AC
  Administered 2015-10-18: 1000 mL via INTRAVENOUS

## 2015-10-18 MED ORDER — MORPHINE SULFATE (PF) 4 MG/ML IV SOLN: 6.0000 mg | Freq: Once | INTRAVENOUS | Status: DC

## 2015-10-18 MED ORDER — METRONIDAZOLE IN NACL 5-0.79 MG/ML-% IV SOLN
500.0000 mg | Freq: Three times a day (TID) | INTRAVENOUS | Status: DC
  Administered 2015-10-18 – 2015-10-20 (×7): 500 mg via INTRAVENOUS
  Filled 2015-10-18 (×9): qty 100

## 2015-10-18 MED ORDER — SODIUM CHLORIDE 0.9 % IJ SOLN
3.0000 mL | Freq: Two times a day (BID) | INTRAMUSCULAR | Status: DC
  Administered 2015-10-19: 3 mL via INTRAVENOUS

## 2015-10-18 MED ORDER — MAGIC MOUTHWASH W/LIDOCAINE
15.0000 mL | Freq: Once | ORAL | Status: AC
  Administered 2015-10-18: 15 mL via ORAL
  Filled 2015-10-18: qty 15

## 2015-10-18 MED ORDER — IOHEXOL 300 MG/ML  SOLN
100.0000 mL | Freq: Once | INTRAMUSCULAR | Status: AC | PRN
  Administered 2015-10-18: 100 mL via INTRAVENOUS

## 2015-10-18 MED ORDER — KETOROLAC TROMETHAMINE 30 MG/ML IJ SOLN
30.0000 mg | Freq: Two times a day (BID) | INTRAMUSCULAR | Status: DC | PRN
Start: 2015-10-18 — End: 2015-10-20

## 2015-10-18 MED ORDER — ONDANSETRON HCL 4 MG/2ML IJ SOLN
4.0000 mg | Freq: Once | INTRAMUSCULAR | Status: AC
  Administered 2015-10-18: 4 mg via INTRAVENOUS
  Filled 2015-10-18: qty 2

## 2015-10-18 MED ORDER — DEXTROSE 5 % IV SOLN
2.0000 g | INTRAVENOUS | Status: DC
  Administered 2015-10-19 – 2015-10-20 (×2): 2 g via INTRAVENOUS
  Filled 2015-10-18 (×2): qty 2

## 2015-10-18 MED ORDER — CEFTRIAXONE SODIUM 1 G IJ SOLR
1.0000 g | INTRAMUSCULAR | Status: DC
  Administered 2015-10-18: 1 g via INTRAVENOUS
  Filled 2015-10-18: qty 10

## 2015-10-18 MED ORDER — SODIUM CHLORIDE 0.9 % IV SOLN: 8.0000 mg | Freq: Four times a day (QID) | INTRAVENOUS | Status: DC | PRN

## 2015-10-18 MED ORDER — KETOROLAC TROMETHAMINE 30 MG/ML IJ SOLN
30.0000 mg | Freq: Three times a day (TID) | INTRAMUSCULAR | Status: DC
  Filled 2015-10-18: qty 1

## 2015-10-18 MED ORDER — HYDROCORTISONE 2.5 % RE CREA
TOPICAL_CREAM | Freq: Two times a day (BID) | RECTAL | Status: DC | PRN
  Filled 2015-10-18: qty 28.35

## 2015-10-18 MED ORDER — PROMETHAZINE HCL 25 MG/ML IJ SOLN: 12.5000 mg | Freq: Four times a day (QID) | INTRAMUSCULAR | Status: DC | PRN

## 2015-10-18 MED ORDER — ACETAMINOPHEN 325 MG PO TABS: 650.0000 mg | ORAL_TABLET | Freq: Four times a day (QID) | ORAL | Status: DC | PRN

## 2015-10-18 MED ORDER — LORAZEPAM 2 MG/ML IJ SOLN
1.0000 mg | Freq: Once | INTRAMUSCULAR | Status: AC
  Administered 2015-10-18: 1 mg via INTRAVENOUS
  Filled 2015-10-18: qty 1

## 2015-10-18 MED ORDER — DEXTROSE 5 % IV SOLN
1.0000 g | INTRAVENOUS | Status: AC
  Filled 2015-10-18: qty 10

## 2015-10-18 MED ORDER — METOPROLOL TARTRATE 1 MG/ML IV SOLN
5.0000 mg | Freq: Four times a day (QID) | INTRAVENOUS | Status: DC
  Administered 2015-10-18 – 2015-10-19 (×4): 5 mg via INTRAVENOUS
  Filled 2015-10-18 (×4): qty 5

## 2015-10-18 MED ORDER — PHENOL 1.4 % MT LIQD
1.0000 | Freq: Four times a day (QID) | OROMUCOSAL | Status: DC | PRN
  Administered 2015-10-18 (×2): 1 via OROMUCOSAL
  Filled 2015-10-18: qty 177

## 2015-10-18 MED ORDER — KETOROLAC TROMETHAMINE 30 MG/ML IJ SOLN
30.0000 mg | Freq: Two times a day (BID) | INTRAMUSCULAR | Status: DC
  Administered 2015-10-18: 30 mg via INTRAVENOUS
  Filled 2015-10-18: qty 1

## 2015-10-18 MED ORDER — ACETAMINOPHEN 650 MG RE SUPP
650.0000 mg | Freq: Four times a day (QID) | RECTAL | Status: DC | PRN
Start: 2015-10-18 — End: 2015-10-20

## 2015-10-18 MED ORDER — MORPHINE SULFATE (PF) 4 MG/ML IV SOLN
4.0000 mg | Freq: Once | INTRAVENOUS | Status: AC
  Administered 2015-10-18: 4 mg via INTRAVENOUS
  Filled 2015-10-18: qty 1

## 2015-10-18 MED ORDER — SODIUM CHLORIDE 0.9 % IV SOLN
INTRAVENOUS | Status: DC
  Administered 2015-10-18 (×2): via INTRAVENOUS

## 2015-10-18 MED ORDER — HEPARIN (PORCINE) IN NACL 100-0.45 UNIT/ML-% IJ SOLN
1200.0000 [IU]/h | INTRAMUSCULAR | Status: DC
  Administered 2015-10-18: 1200 [IU]/h via INTRAVENOUS
  Filled 2015-10-18 (×2): qty 250

## 2015-10-18 MED ORDER — BENZOCAINE (TOPICAL) 20 % EX AERO: INHALATION_SPRAY | Freq: Four times a day (QID) | CUTANEOUS | Status: DC | PRN

## 2015-10-18 MED ORDER — MORPHINE SULFATE (PF) 2 MG/ML IV SOLN
1.0000 mg | INTRAVENOUS | Status: DC | PRN
  Administered 2015-10-18 – 2015-10-19 (×2): 2 mg via INTRAVENOUS
  Filled 2015-10-18 (×2): qty 1

## 2015-10-18 NOTE — Progress Notes (Signed)
Utilization review completed.  

## 2015-10-18 NOTE — H&P (Signed)
[facility] History and Physical                                                                                    Blake [name], is a 69 y.o. male  MRN: [id number]   DOB - Aug 13, 1946  Admit Date - 10/18/2015  Outpatient Primary MD for the patient is Unice Cobble, MD  Referring MD: Venora Maples / ER  Consulting M.D: Donne Hazel / Surgery  PMH: Past Medical History  Diagnosis Date  . Elevated PSA   . Hyperlipidemia   . HYPERLIPIDEMIA 05/25/2009  . BINGE EATING DISORDER 05/25/2009  . Chest pain 03/05/2011  . Obesity   . BPH (benign prostatic hyperplasia)   . Gastric ulcer   . CAD (coronary artery disease)   . Symptoms involving urinary system   . Urgency of urination   . Left inguinal hernia   . History of nephrolithiasis   . Angina     none since CABG  . Dysrhythmia     atrial fib post Cabg/ 5 days Coumadin only  . Chronic kidney disease     kidney stones years ago  . Blood transfusion     6/12  following GI Bleed with coumadin therapy  . Atrial fibrillation (HCC)       PSH: Past Surgical History  Procedure Laterality Date  . Polypectomy      x 2  . Esophagogastroduodenoscopy endoscopy  04/12/11    cauterization of bleeding ulcer  and artery in duodenum and stomach   . Wisdom tooth extraction    . Coronary artery bypass graft  03/27/2011    x3, Dr Tharon Aquas Trigt    . Cardiac catheterization      6/12  . Inguinal hernia repair  11/06/2011    Procedure: HERNIA REPAIR INGUINAL ADULT;  Surgeon: Earnstine Regal, MD;  Location: [facility];  Service: General;  Laterality: N/A;  Repair left inguinal hernia with mesh     CC:  Chief Complaint  Patient presents with  . Diarrhea  . Chest Pain     HPI: 89 male patient history of CAD post CABG in 2012, chronic atrial fibrillation on Eliquis, hypertension and obesity. Patient presented to the ER with 2-3 days of progressive diarrhea that worsened last night about 8:30. He began having frequent episodes about every 10  minutes. He describes the diarrhea as being watery and mucoid without any blood. He reports symptoms are similar to remote food poisoning episode while traveling in [place] but no other friends or family members who have eaten the same foods that the patient has experienced similar symptoms. He has not had any fevers or chills. He has not had any recent upper endoscopic procedures or lower endoscopic procedures. He has remote history of duodenal ulcer 4 years prior. No chest pain, no palpitations. Shortness of breath, no lower extremity edema.  ER Evaluation and treatment: Afebrile and hemodynamically stable except for mild tachycardia in the ER Two-view chest x-ray: No edema or consolidation CT abdomen and pelvis with contrast: Gas within portal venous branches within the liver, portal and superior mesenteric veins and within gaseous veins, stomach distended but otherwise normal in appearance  without obvious ulcer, mass or wall thickening or gastric pneumatosis though the distribution of gas within the perigastric veins was suggestive of a gastric etiology Calcium 11.6 Glucose 140 Troponin normal WBC 22,500, hemoglobin 17.8 (baseline between 14 and 15) Urinalysis abnormal concerning for possible UTI Normal saline bolus 1000 mL Zofran 4 mg 2 Morphine 4 mg 3 Ativan 1 mg 1 NG tube placed with 300 mL returned  Review of Systems   In addition to the HPI above,  No Fever-chills, myalgias or other constitutional symptoms No Headache, changes with Vision or hearing, new weakness, tingling, numbness in any extremity, No problems swallowing food or Liquids No Chest pain, Cough or Shortness of Breath, palpitations, orthopnea or DOE No melena or hematochezia, no dark tarry stools No dysuria, hematuria or flank pain No new skin rashes, lesions, masses or bruises, No new joints pains-aches No recent weight gain or loss No polyuria, polydypsia or polyphagia,  *A full 10 point Review of Systems was  done, except as stated above, all other Review of Systems were negative.  Social History Social History  Substance Use Topics  . Smoking status: Former Smoker -- 1.00 packs/day for 25 years    Quit date: 03/04/1988  . Smokeless tobacco: Never Used  . Alcohol Use: 0.6 oz/week    1 Cans of beer per week     Comment: occ    Resides at: Private residence  Lives with: Spouse  Ambulatory status: Without assistive devices   Family History Family History  Problem Relation Age of Onset  . Coronary artery disease Father   . Heart attack Father 11  . Cancer Father   . Lymphoma    . Diabetes Sister   . Heart attack Sister 20  . Ovarian cancer Sister   . Atrial fibrillation Brother   . Colon cancer Neg Hx   . Stomach cancer Neg Hx      Prior to Admission medications   Medication Sig Start Date End Date Taking? Authorizing Provider  apixaban (ELIQUIS) 5 MG TABS tablet Take 1 tablet (5 mg total) by mouth 2 (two) times daily. 10/04/15  Yes Lelon Perla, MD  atorvastatin (LIPITOR) 80 MG tablet Take 1 tablet (80 mg total) by mouth daily. 09/05/15  Yes Brett Canales, PA-C  gabapentin (NEURONTIN) 300 MG capsule TAKE 1 CAPSULE (300 MG TOTAL) BY MOUTH AT BEDTIME. 09/19/15  Yes Hendricks Limes, MD  metoprolol (LOPRESSOR) 50 MG tablet Take 1 tablet (50 mg total) by mouth 2 (two) times daily. 09/05/15  Yes Brett Canales, PA-C  tamsulosin (FLOMAX) 0.4 MG CAPS capsule Take 0.4 mg by mouth daily.    Yes Historical Provider, MD    No Known Allergies  Physical Exam  Vitals  Blood pressure 123/81, pulse 113, temperature 97.6 F (36.4 C), temperature source Oral, resp. rate 22, SpO2 91 %.   General:  In mild acute distress is evidence for ongoing abdominal discomfort and nausea,   Psych:  Normal affect, Denies Suicidal or Homicidal ideations, Awake Alert, Oriented X 3. Speech and thought patterns are clear and appropriate, no apparent short term memory deficits  Neuro:   No focal  neurological deficits, CN II through XII intact, Strength 5/5 all 4 extremities, Sensation intact all 4 extremities.  ENT:  Ears and Eyes appear Normal, Conjunctivae clear, PER. Moist oral mucosa without erythema or exudates.  Neck:  Supple, No lymphadenopathy appreciated  Respiratory:  Symmetrical chest wall movement, Good air movement bilaterally, CTAB. Room [employer]  Cardiac:  RRR, No Murmurs, no LE edema noted, no JVD, No carotid bruits, peripheral pulses palpable at 2+  Abdomen:  Positive hyperactive and gurgling bowel sounds, Soft, diffusely tender without guarding or rebounding, Non distended,  No masses appreciated, no obvious hepatosplenomegaly, NG tube to nare to low wall suction  Skin:  No Cyanosis, Normal Skin Turgor, No Skin Rash or Bruise.  Extremities: Symmetrical without obvious trauma or injury,  no effusions.  Data Review  CBC  Recent Labs Lab 10/18/15 0643  WBC 22.5*  HGB 17.8*  HCT 53.4*  PLT 278  MCV 100.4*  MCH 33.5  MCHC 33.3  RDW 12.8    Chemistries   Recent Labs Lab 10/18/15 0643  NA 141  K 4.8  CL 107  CO2 23  GLUCOSE 140*  BUN 18  CREATININE 1.24  CALCIUM 11.6*  AST 26  ALT 24  ALKPHOS 87  BILITOT 1.5*    CrCl cannot be calculated (Unknown ideal weight.).  No results for input(s): TSH, T4TOTAL, T3FREE, THYROIDAB in the last 72 hours.  Invalid input(s): FREET3  Coagulation profile No results for input(s): INR, PROTIME in the last 168 hours.  No results for input(s): DDIMER in the last 72 hours.  Cardiac Enzymes No results for input(s): CKMB, TROPONINI, MYOGLOBIN in the last 168 hours.  Invalid input(s): CK  Invalid input(s): POCBNP  Urinalysis    Component Value Date/Time   COLORURINE AMBER* 10/18/2015 0701   APPEARANCEUR HAZY* 10/18/2015 0701   LABSPEC 1.026 10/18/2015 0701   PHURINE 5.0 10/18/2015 0701   GLUCOSEU NEGATIVE 10/18/2015 0701   HGBUR TRACE* 10/18/2015 0701   BILIRUBINUR SMALL* 10/18/2015 0701    BILIRUBINUR NEG 08/25/2011 1012   KETONESUR 15* 10/18/2015 0701   PROTEINUR NEGATIVE 10/18/2015 0701   PROTEINUR NEG 08/25/2011 1012   UROBILINOGEN 0.2 08/25/2011 1012   UROBILINOGEN 0.2 03/29/2011 1507   NITRITE NEGATIVE 10/18/2015 0701   NITRITE NEG 08/25/2011 1012   LEUKOCYTESUR SMALL* 10/18/2015 0701    Imaging results:   Dg Chest 2 View  10/18/2015  CLINICAL DATA:  Chest pain with shortness of breath and nausea EXAM: CHEST  2 VIEW COMPARISON:  Chest radiograph December 11, 2013 and chest CT Feb 21, 2014 FINDINGS: There is no edema or consolidation. Heart size and pulmonary vascularity are normal. There is no appreciable adenopathy. Patient is status post coronary artery bypass grafting. No pneumothorax. There is slight degenerative change in the thoracic spine. There is a benign exostosis arising from the medial proximal right humerus. IMPRESSION: No edema or consolidation. Electronically Signed   By: Lowella Grip III M.D.   On: 10/18/2015 07:15   Ct Abdomen Pelvis W Contrast  10/18/2015  CLINICAL DATA:  LEFT lower quadrant abdominal pain, diarrhea and nausea for last couple days, history hyperlipidemia, coronary artery disease post CABG, LEFT inguinal hernia, atrial fibrillation, gastric ulcer, BPH, former smoker EXAM: CT ABDOMEN AND PELVIS WITH CONTRAST TECHNIQUE: Multidetector CT imaging of the abdomen and pelvis was performed using the standard protocol following bolus administration of intravenous contrast. Sagittal and coronal MPR images reconstructed from axial data set. CONTRAST:  127mL OMNIPAQUE IOHEXOL 300 MG/ML SOLN IV. No oral contrast. COMPARISON:  09/11/2011 FINDINGS: Lung bases clear. 5 mm nonspecific low-attenuation focus dome of liver image 15, not visualized on prior noncontrast exam. Gas identified within portal venous branches in the lateral segment LEFT lobe with additional portal venous gas seen within portal and superior mesenteric veins as well as in perigastric  veins. Stomach moderately  distended by fluid and minimal food debris. No definite gastric pneumatosis, wall thickening, or mass identified. Remainder of liver, spleen, pancreas, kidneys, and adrenal glands normal. Scattered atherosclerotic calcifications aorta, iliac arteries and coronary arteries. Normal appendix. Mild sigmoid diverticulosis without evidence of diverticulitis. Remaining bowel loops unremarkable. Normal appearing bladder and ureters. Mild prostatic enlargement gland 5.4 x 4.2 cm image 92. No mass, adenopathy, free air or free fluid. Small RIGHT inguinal hernia containing fat with apparent postsurgical changes of LEFT inguinal herniorrhaphy. Bones demineralized. IMPRESSION: Gas within portal venous branches within liver, portal and superior mesenteric veins, and within perigastric veins. Stomach appears distended but is otherwise normal in appearance without obvious ulcer, mass/wall thickening or gastric pneumatosis, though the distribution of the gas within perigastric veins suggests a gastric etiology. Portal venous gas can be seen with bowel ischemia, from barotrauma such as following endoscopy, from acute gastric distention, perforated gastric ulcer, COPD, and GI tract obstruction. The lack of additional significant findings suggests this could be related to a benign cause such as acute gastric distention and close clinical followup is recommended to determine the clinical significance of these findings. Sigmoid diverticulosis. Prostatic enlargement. RIGHT inguinal hernia. Findings called to Domenic Moras PA on 10/18/2015 at 0825 hours. Electronically Signed   By: Lavonia Dana M.D.   On: 10/18/2015 08:27     EKG: (Independently reviewed) age her fibrillation with ventricular rate of 108 bpm, QTC 432 ms, no ischemic changes.   Assessment & Plan  Principal Problem:   Abdominal pain, vomiting, and diarrhea/Disorder of portal venous system -Inpatient/telemetry -Findings concerning for possible  ischemic etiology with significant leukocytosis white count 22,500 so check stat lactic acid -Gen. surgery consulted by EDP -Continue bowel rest and NG tube -prn IV anti-emetics medications and narcotics -Empiric Rocephin and Flagyl -C. difficile PCR and GI stool pathogen panel  Active Problems:   Dehydration, severe -IV fluids -Follow labs primarily potassium in setting of high volume GI losses and need for NG tube    Atrial fibrillation (HCC) -Chronic and mild tachycardia in setting of acute dehydration, abdominal pain and significant diarrheal illness of uncertain etiology -Hold eliquis in favor of IV heparin with pharmacy managing -We'll convert Lopressor to IV and give scheduled dosing with hold parameters -CHADVASc=3    Coronary artery disease -Currently asymptomatic    HTN  -Blood pressure suboptimal; continue to monitor    Hyperlipidemia -NPO so preadmission statin on hold    BPH  -Monitor for urinary retention -NPO so Flomax on hold    Obesity (BMI 30-39.9)    DVT Prophylaxis: IV heparin  Family Communication:   Son at bedside  Code Status:  Full code  Condition:  Stable  Discharge disposition: Anticipate discharge back to home environment once medically stable pending determination of etiology of current symptoms; potential could require surgical intervention this admission and this would change discharge plan  Time spent in minutes : 60      ELLIS,ALLISON L. ANP on 10/18/2015 at 9:28 AM  You may contact me by going to [email or web address] - password [email or web address]  I am available from 7a-7p but please confirm I am on the schedule by going to Amion as above.   After 7p please contact night coverage person covering me after hours  [facility]

## 2015-10-18 NOTE — Care Management Note (Signed)
Case Management Note Marvetta Gibbons RN, BSN Unit 2W-Case Manager [phone] Covering 3W   Patient Details  Name: Blake [name] MRN: [id number] Date of Birth: 03/07/1946  Subjective/Objective: Pt admitted with abd pain N/V/D- Portal venous gas with NGT placement- surgery following                   Action/Plan: PTA pt lived at home with spouse- independent- CM to follow for d/c needs.  Expected Discharge Date:                  Expected Discharge Plan:  Home/Self Care  In-House Referral:     Discharge planning Services  CM Consult  Post Acute Care Choice:    Choice offered to:     DME Arranged:    DME Agency:     HH Arranged:    HH Agency:     Status of Service:  In process, will continue to follow  Medicare Important Message Given:    Date Medicare IM Given:    Medicare IM give by:    Date Additional Medicare IM Given:    Additional Medicare Important Message give by:     If discussed at [place] of Stay Meetings, dates discussed:    Additional Comments:  Dawayne Patricia, RN 10/18/2015, 12:18 PM

## 2015-10-18 NOTE — Progress Notes (Signed)
ANTICOAGULATION CONSULT NOTE  Pharmacy Consult for heparin Indication: atrial fibrillation  No Known Allergies  Patient Measurements: Height: 5\' 6"  (167.6 cm) Weight: 198 lb (89.812 kg) IBW/kg (Calculated) : 63.8 Heparin Dosing Weight: 83.9kg  Vital Signs: Temp: 98.3 F (36.8 C) (12/27 1411) Temp Source: Oral (12/27 1411) BP: 108/72 mmHg (12/27 1411) Pulse Rate: 106 (12/27 1411)  Labs:  Recent Labs  10/18/15 0643 10/18/15 1056 10/18/15 1842  HGB 17.8*  --   --   HCT 53.4*  --   --   PLT 278  --   --   APTT  --  31 84*  HEPARINUNFRC  --  0.83*  --   CREATININE 1.24  --   --     Estimated Creatinine Clearance: 57.3 mL/min (by C-G formula based on Cr of 1.24).   Medical History: Past Medical History  Diagnosis Date  . Elevated PSA   . Hyperlipidemia   . HYPERLIPIDEMIA 05/25/2009  . BINGE EATING DISORDER 05/25/2009  . Chest pain 03/05/2011  . Obesity   . BPH (benign prostatic hyperplasia)   . Gastric ulcer   . CAD (coronary artery disease)   . Symptoms involving urinary system   . Urgency of urination   . Left inguinal hernia   . History of nephrolithiasis   . Angina     none since CABG  . Dysrhythmia     atrial fib post Cabg/ 5 days Coumadin only  . Chronic kidney disease     kidney stones years ago  . Blood transfusion     6/12  following GI Bleed with coumadin therapy  . Atrial fibrillation (HCC)     Medications:  Infusions:  . sodium chloride 100 mL/hr at 10/18/15 0943  . heparin 1,200 Units/hr (10/18/15 1050)    Assessment: 2 yom presented to the ED with CP and diarrhea. He is on chronic apixaban for history of afib. His last dose was 12/26 in the evening.  He is now on IV heparin while apixaban is on hold. The baseline heparin level was 0.83 and this reflects prior apixiban dosing.  -aPTT= 84 and at goal  Goal of Therapy:  aPTT 66-102 seconds Monitor platelets by anticoagulation protocol: Yes   Plan:   -No heparin changes needed -Daily  aPTT and heparin level  Hildred Laser, Pharm D 10/18/2015 8:10 PM

## 2015-10-18 NOTE — ED Notes (Signed)
Pt complaining of severe nausea and pt is noted to be more pale. MD notified and verbal order given for 4mg  zofran IV.

## 2015-10-18 NOTE — ED Notes (Signed)
[place], PA at the bedside 

## 2015-10-18 NOTE — Plan of Care (Signed)
Problem: Bowel/Gastric: Goal: Will not experience complications related to bowel motility Outcome: Progressing NG tube in place to LIS

## 2015-10-18 NOTE — Progress Notes (Signed)
ANTICOAGULATION CONSULT NOTE - Initial Consult  Pharmacy Consult for heparin Indication: atrial fibrillation  No Known Allergies  Patient Measurements: Height: 5\' 6"  (167.6 cm) Weight: 206 lb 2.1 oz (93.5 kg) (per past documentation) IBW/kg (Calculated) : 63.8 Heparin Dosing Weight: 83.9kg  Vital Signs: Temp: 97.6 F (36.4 C) (12/27 0637) Temp Source: Oral (12/27 0637) BP: 126/79 mmHg (12/27 0930) Pulse Rate: 114 (12/27 0930)  Labs:  Recent Labs  10/18/15 0643  HGB 17.8*  HCT 53.4*  PLT 278  CREATININE 1.24    Estimated Creatinine Clearance: 58.5 mL/min (by C-G formula based on Cr of 1.24).   Medical History: Past Medical History  Diagnosis Date  . Elevated PSA   . Hyperlipidemia   . HYPERLIPIDEMIA 05/25/2009  . BINGE EATING DISORDER 05/25/2009  . Chest pain 03/05/2011  . Obesity   . BPH (benign prostatic hyperplasia)   . Gastric ulcer   . CAD (coronary artery disease)   . Symptoms involving urinary system   . Urgency of urination   . Left inguinal hernia   . History of nephrolithiasis   . Angina     none since CABG  . Dysrhythmia     atrial fib post Cabg/ 5 days Coumadin only  . Chronic kidney disease     kidney stones years ago  . Blood transfusion     6/12  following GI Bleed with coumadin therapy  . Atrial fibrillation (HCC)     Medications:  Infusions:  . sodium chloride 100 mL/hr at 10/18/15 0943  . cefTRIAXone (ROCEPHIN)  IV 1 g (10/18/15 0941)  . heparin    . metronidazole 500 mg (10/18/15 0944)  . sodium chloride 1,000 mL (10/18/15 0855)    Assessment: 50 yom presented to the ED with CP and diarrhea. He is on chronic apixaban for history of afib. His last dose was last night. Baseline H/H elevated and platelets are WNL. No bleeding noted. To start IV heparin while apixaban is on hold.   Goal of Therapy:  aPTT 66-102 seconds Monitor platelets by anticoagulation protocol: Yes   Plan:  - Obtain baseline heparin level and aPTT - Heparin  gtt 1200 units/hr (>12 hours since last apixaban dose) - Check an 8 hour aPTT - Daily aPTT, heparin level and CBC  Blake [name], Rande Lawman 10/18/2015,9:45 AM

## 2015-10-18 NOTE — Progress Notes (Signed)
RN into give dose of Toradol and RN misunderstood patient earlier.  Per patient Toradol did not really help with throat pain.  Patient requesting something for pain other than Toradol and Morphine.  RN re-paged [facility] with this information.

## 2015-10-18 NOTE — ED Notes (Addendum)
MD at bedside to evaluate patient.

## 2015-10-18 NOTE — Consult Note (Signed)
Reason for Consult: portal venous gas Referring Physician: Dr. Jola Schmidt   HPI: Blake [name] is a 69 year old male with a history of Afib on Eliquis(last dose 12/26 PM), CAD s/p CABG in 2013, HTN, BPH, gastric ulcer, left inguinal hernia repair in 2013 presenting with a 3 day history of diarrhea.  Initially started as diarrhea.  Endorses increased amount of oral intake with the holidays and a variety of food, but denies ill close contacts.  Last night at 8PM diarrhea worsened along with infraumbilical abdominal pain.  Pain was severe and constant.  No aggravating or alleviating factors.  Modifying factors include; tums.  Endorses nausea, but no vomiting. Denies melena or hematochezia.  Denies previous symptoms.  Apparently had an endoscopy several years ago for a bleeding gastric ulcer, but denies recent procedures.    Work up shows WBC increased at 22.5k. CT of abdomen and pelvis reveals liver, portal venous gas.  We have therefore been asked to evaluate.  At present time, pain has resolved, diarrhea has resolved.  He is normotensive, but is tachycardic.    Past Medical History  Diagnosis Date  . Elevated PSA   . Hyperlipidemia   . HYPERLIPIDEMIA 05/25/2009  . BINGE EATING DISORDER 05/25/2009  . Chest pain 03/05/2011  . Obesity   . BPH (benign prostatic hyperplasia)   . Gastric ulcer   . CAD (coronary artery disease)   . Symptoms involving urinary system   . Urgency of urination   . Left inguinal hernia   . History of nephrolithiasis   . Angina     none since CABG  . Dysrhythmia     atrial fib post Cabg/ 5 days Coumadin only  . Chronic kidney disease     kidney stones years ago  . Blood transfusion     6/12  following GI Bleed with coumadin therapy  . Atrial fibrillation [facility])     Past Surgical History  Procedure Laterality Date  . Polypectomy      x 2  . Esophagogastroduodenoscopy endoscopy  04/12/11    cauterization of bleeding ulcer  and artery in duodenum and stomach   .  Wisdom tooth extraction    . Coronary artery bypass graft  03/27/2011    x3, Dr Tharon Aquas Trigt    . Cardiac catheterization      6/12  . Inguinal hernia repair  11/06/2011    Procedure: HERNIA REPAIR INGUINAL ADULT;  Surgeon: Earnstine Regal, MD;  Location: [facility]S;  Service: General;  Laterality: N/A;  Repair left inguinal hernia with mesh    Family History  Problem Relation Age of Onset  . Coronary artery disease Father   . Heart attack Father 39  . Cancer Father   . Lymphoma    . Diabetes Sister   . Heart attack Sister 4  . Ovarian cancer Sister   . Atrial fibrillation Brother   . Colon cancer Neg Hx   . Stomach cancer Neg Hx     Social History:  reports that he quit smoking about 27 years ago. He has never used smokeless tobacco. He reports that he drinks about 0.6 oz of alcohol per week. He reports that he does not use illicit drugs.  Allergies: No Known Allergies  Medications:  Scheduled Meds: . metoprolol  5 mg Intravenous 4 times per day  .  morphine injection  6 mg Intravenous Once   Continuous Infusions: . sodium chloride 100 mL/hr at 10/18/15 0943  . cefTRIAXone (ROCEPHIN)  IV 1 g (10/18/15 0941)  . heparin    . metronidazole 500 mg (10/18/15 0944)  . sodium chloride 1,000 mL (10/18/15 0855)   PRN Meds:.ondansetron (ZOFRAN) IV **OR** promethazine   Results for orders placed or performed during the hospital encounter of 10/18/15 (from the past 48 hour(s))  Lipase, blood     Status: None   Collection Time: 10/18/15  6:43 AM  Result Value Ref Range   Lipase 39 11 - 51 U/L  Comprehensive metabolic panel     Status: Abnormal   Collection Time: 10/18/15  6:43 AM  Result Value Ref Range   Sodium 141 135 - 145 mmol/L   Potassium 4.8 3.5 - 5.1 mmol/L   Chloride 107 101 - 111 mmol/L   CO2 23 22 - 32 mmol/L   Glucose, Bld 140 (H) 65 - 99 mg/dL   BUN 18 6 - 20 mg/dL   Creatinine, Ser 1.24 0.61 - 1.24 mg/dL   Calcium 11.6 (H) 8.9 - 10.3 mg/dL   Total Protein 8.8  (H) 6.5 - 8.1 g/dL   Albumin 4.9 3.5 - 5.0 g/dL   AST 26 15 - 41 U/L   ALT 24 17 - 63 U/L   Alkaline Phosphatase 87 38 - 126 U/L   Total Bilirubin 1.5 (H) 0.3 - 1.2 mg/dL   GFR calc non Af Amer 57 (L) >60 mL/min   GFR calc Af Amer >60 >60 mL/min    Comment: (NOTE) The eGFR has been calculated using the CKD EPI equation. This calculation has not been validated in all clinical situations. eGFR's persistently <60 mL/min signify possible Chronic Kidney Disease.    Anion gap 11 5 - 15  CBC     Status: Abnormal   Collection Time: 10/18/15  6:43 AM  Result Value Ref Range   WBC 22.5 (H) 4.0 - 10.5 K/uL   RBC 5.32 4.22 - 5.81 MIL/uL   Hemoglobin 17.8 (H) 13.0 - 17.0 g/dL   HCT 53.4 (H) 39.0 - 52.0 %   MCV 100.4 (H) 78.0 - 100.0 fL   MCH 33.5 26.0 - 34.0 pg   MCHC 33.3 30.0 - 36.0 g/dL   RDW 12.8 11.5 - 15.5 %   Platelets 278 150 - 400 K/uL  I-stat troponin, ED (not at [facility], [facility])     Status: None   Collection Time: 10/18/15  6:44 AM  Result Value Ref Range   Troponin i, poc 0.00 0.00 - 0.08 ng/mL   Comment 3            Comment: Due to the release kinetics of cTnI, a negative result within the first hours of the onset of symptoms does not rule out myocardial infarction with certainty. If myocardial infarction is still suspected, repeat the test at appropriate intervals.   Urinalysis, Routine w reflex microscopic (not at [facility])     Status: Abnormal   Collection Time: 10/18/15  7:01 AM  Result Value Ref Range   Color, Urine AMBER (A) YELLOW    Comment: MICROSCOPIC EXAM PERFORMED ON UNCONCENTRATED URINE BIOCHEMICALS MAY BE AFFECTED BY COLOR    APPearance HAZY (A) CLEAR   Specific Gravity, Urine 1.026 1.005 - 1.030   pH 5.0 5.0 - 8.0   Glucose, UA NEGATIVE NEGATIVE mg/dL   Hgb urine dipstick TRACE (A) NEGATIVE   Bilirubin Urine SMALL (A) NEGATIVE   Ketones, ur 15 (A) NEGATIVE mg/dL   Protein, ur NEGATIVE NEGATIVE mg/dL   Nitrite NEGATIVE NEGATIVE   Leukocytes, UA  SMALL (A)  NEGATIVE  Urine microscopic-add on     Status: Abnormal   Collection Time: 10/18/15  7:01 AM  Result Value Ref Range   Squamous Epithelial / LPF 0-5 (A) NONE SEEN   WBC, UA 6-30 0 - 5 WBC/hpf   RBC / HPF 0-5 0 - 5 RBC/hpf   Bacteria, UA FEW (A) NONE SEEN   Casts HYALINE CASTS (A) NEGATIVE   Urine-Other MUCOUS PRESENT     Comment: LESS THAN 10 mL OF URINE SUBMITTED    Dg Chest 2 View  10/18/2015  CLINICAL DATA:  Chest pain with shortness of breath and nausea EXAM: CHEST  2 VIEW COMPARISON:  Chest radiograph December 11, 2013 and chest CT Feb 21, 2014 FINDINGS: There is no edema or consolidation. Heart size and pulmonary vascularity are normal. There is no appreciable adenopathy. Patient is status post coronary artery bypass grafting. No pneumothorax. There is slight degenerative change in the thoracic spine. There is a benign exostosis arising from the medial proximal right humerus. IMPRESSION: No edema or consolidation. Electronically Signed   By: Lowella Grip III M.D.   On: 10/18/2015 07:15   Ct Abdomen Pelvis W Contrast  10/18/2015  CLINICAL DATA:  LEFT lower quadrant abdominal pain, diarrhea and nausea for last couple days, history hyperlipidemia, coronary artery disease post CABG, LEFT inguinal hernia, atrial fibrillation, gastric ulcer, BPH, former smoker EXAM: CT ABDOMEN AND PELVIS WITH CONTRAST TECHNIQUE: Multidetector CT imaging of the abdomen and pelvis was performed using the standard protocol following bolus administration of intravenous contrast. Sagittal and coronal MPR images reconstructed from axial data set. CONTRAST:  165m OMNIPAQUE IOHEXOL 300 MG/ML SOLN IV. No oral contrast. COMPARISON:  09/11/2011 FINDINGS: Lung bases clear. 5 mm nonspecific low-attenuation focus dome of liver image 15, not visualized on prior noncontrast exam. Gas identified within portal venous branches in the lateral segment LEFT lobe with additional portal venous gas seen within portal and superior  mesenteric veins as well as in perigastric veins. Stomach moderately distended by fluid and minimal food debris. No definite gastric pneumatosis, wall thickening, or mass identified. Remainder of liver, spleen, pancreas, kidneys, and adrenal glands normal. Scattered atherosclerotic calcifications aorta, iliac arteries and coronary arteries. Normal appendix. Mild sigmoid diverticulosis without evidence of diverticulitis. Remaining bowel loops unremarkable. Normal appearing bladder and ureters. Mild prostatic enlargement gland 5.4 x 4.2 cm image 92. No mass, adenopathy, free air or free fluid. Small RIGHT inguinal hernia containing fat with apparent postsurgical changes of LEFT inguinal herniorrhaphy. Bones demineralized. IMPRESSION: Gas within portal venous branches within liver, portal and superior mesenteric veins, and within perigastric veins. Stomach appears distended but is otherwise normal in appearance without obvious ulcer, mass/wall thickening or gastric pneumatosis, though the distribution of the gas within perigastric veins suggests a gastric etiology. Portal venous gas can be seen with bowel ischemia, from barotrauma such as following endoscopy, from acute gastric distention, perforated gastric ulcer, COPD, and GI tract obstruction. The lack of additional significant findings suggests this could be related to a benign cause such as acute gastric distention and close clinical followup is recommended to determine the clinical significance of these findings. Sigmoid diverticulosis. Prostatic enlargement. RIGHT inguinal hernia. Findings called to BDomenic MorasPA on 10/18/2015 at 0825 hours. Electronically Signed   By: MLavonia DanaM.D.   On: 10/18/2015 08:27    Review of Systems  Constitutional: Positive for malaise/fatigue. Negative for fever, chills, weight loss and diaphoresis.  Respiratory: Negative for cough, hemoptysis, sputum production, shortness  of breath and wheezing.   Cardiovascular: Positive  for chest pain. Negative for palpitations, orthopnea, claudication, leg swelling and PND.  Gastrointestinal: Positive for nausea, abdominal pain and diarrhea. Negative for heartburn, vomiting, constipation, blood in stool and melena.  Genitourinary: Negative for dysuria, urgency, frequency, hematuria and flank pain.  Musculoskeletal: Negative for myalgias, back pain, joint pain, falls and neck pain.  Neurological: Positive for weakness. Negative for dizziness, tingling, tremors, sensory change, speech change, focal weakness, seizures, loss of consciousness and headaches.  Psychiatric/Behavioral: Negative for substance abuse.   Blood pressure 123/81, pulse 113, temperature 97.6 F (36.4 C), temperature source Oral, resp. rate 22, height _0  (1.676 m), weight 93.5 kg (206 lb 2.1 oz), SpO2 91 %. Physical Exam  Constitutional: He is oriented to person, place, and time. He appears well-developed and well-nourished. No distress.  Cardiovascular: Normal rate, normal heart sounds and intact distal pulses.   No murmur heard. Respiratory: Effort normal and breath sounds normal. No respiratory distress. He has no wheezes. He has no rales. He exhibits no tenderness.  GI: Soft. Bowel sounds are normal. He exhibits no distension and no mass. There is no tenderness. There is no rebound and no guarding.  Musculoskeletal: Normal range of motion. He exhibits no edema or tenderness.  Neurological: He is alert and oriented to person, place, and time.  Skin: Skin is warm and dry. No rash noted. He is not diaphoretic. No erythema. No pallor.  Psychiatric: He has a normal mood and affect. His behavior is normal. Judgment and thought content normal.    Assessment/Plan: Diarrhea Leukocytosis Portal venous gas Abdominal pain Dehydration  On physical exam, abdomen is soft and non tender, bowel sounds are present and therefore low suspicion for bowel ischemia at present time. There are no indications for urgent  surgical intervention.  Recommend admission to SDU, serial abdominal exams, antibiotics and NGT to [facility] given the amount of gastric distention on CT.  I discussed the plan of care with the patient and his son.  He understands should he clinically worsen, may require a laparotomy, in which case they would like to further discuss with wife's presence.  Hold Eliquis. Lactic acid is pending.  Would not be surprised if this is elevated.  Recommend aggressive IVF resuscitation and close follow up.  Will follow along closely.  Thank you for the consult.  Atrial fibrillation-last eliquis dose PM 12/26.  Continue to hold. Does not need to be reversed at this point.  May give heparin/lovenox from a surgical standpoint   Oneida Arenas [facility] ANP-BC Pager [phone] 10/18/2015, 9:35 AM

## 2015-10-18 NOTE — ED Provider Notes (Signed)
CSN: [id number]     Arrival date & time 10/18/15  0622 History   First MD Initiated Contact with Patient 10/18/15 [phone]     Chief Complaint  Patient presents with  . Diarrhea  . Chest Pain     (Consider location/radiation/quality/duration/timing/severity/associated sxs/prior Treatment) HPI   69 year old obese male with history of binge eating disorder, CAD, A. fib currently on a Eliquis presenting for evaluation of diarrhea. Patient states for the past 3 days he has had progressive worsening nonbloody non-mucousy diarrhea. Initially was less than 5 times a day but since this morning has been persistent, once every 10 minutes. Early this morning he also developed low abdominal pain which he described as a queasy sensation, 3 out of 10 with associated midsternal chest pain. Both chest and abdominal pain started at the same time. Endorse nausea, and feeling "queasy". He did to several Imodium earlier this morning without any relief. He admits that he has been eating a lot of food during the holiday and there has been a lot of family members around but no one else has been sick. No recent antibiotic use. Patient denies having fever, shortness of breath, productive cough, back pain, dysuria, focal numbness, or rash. Patient also has history of cardiac bypass several years ago. No prior history of MI.  Past Medical History  Diagnosis Date  . Elevated PSA   . Hyperlipidemia   . HYPERLIPIDEMIA 05/25/2009  . BINGE EATING DISORDER 05/25/2009  . Chest pain 03/05/2011  . Obesity   . BPH (benign prostatic hyperplasia)   . Gastric ulcer   . CAD (coronary artery disease)   . Symptoms involving urinary system   . Urgency of urination   . Left inguinal hernia   . History of nephrolithiasis   . Angina     none since CABG  . Dysrhythmia     atrial fib post Cabg/ 5 days Coumadin only  . Chronic kidney disease     kidney stones years ago  . Blood transfusion     6/12  following GI Bleed with coumadin  therapy  . Atrial fibrillation [facility])    Past Surgical History  Procedure Laterality Date  . Polypectomy      x 2  . Stomach surgery  04/12/11    cauterization of bleeding ulcer  and artery in duodenum and stomach   . Wisdom tooth extraction    . Coronary artery bypass graft  03/27/2011    x3, Dr Tharon Aquas Trigt    . Cardiac catheterization      6/12  . Inguinal hernia repair  11/06/2011    Procedure: HERNIA REPAIR INGUINAL ADULT;  Surgeon: Earnstine Regal, MD;  Location: [facility];  Service: General;  Laterality: N/A;  Repair left inguinal hernia with mesh   Family History  Problem Relation Age of Onset  . Coronary artery disease Father   . Heart attack Father 41  . Cancer Father   . Lymphoma    . Diabetes Sister   . Heart attack Sister 33  . Ovarian cancer Sister   . Atrial fibrillation Brother   . Colon cancer Neg Hx   . Stomach cancer Neg Hx    Social History  Substance Use Topics  . Smoking status: Former Smoker -- 1.00 packs/day for 25 years    Quit date: 03/04/1988  . Smokeless tobacco: Never Used  . Alcohol Use: 0.6 oz/week    1 Cans of beer per week     Comment: occ  Review of Systems  All other systems reviewed and are negative.     Allergies  Review of patient's allergies indicates no known allergies.  Home Medications   Prior to Admission medications   Medication Sig Start Date End Date Taking? Authorizing Provider  apixaban (ELIQUIS) 5 MG TABS tablet Take 1 tablet (5 mg total) by mouth 2 (two) times daily. 10/04/15   Lelon Perla, MD  atorvastatin (LIPITOR) 80 MG tablet Take 1 tablet (80 mg total) by mouth daily. 09/05/15   Brett Canales, PA-C  gabapentin (NEURONTIN) 300 MG capsule TAKE 1 CAPSULE (300 MG TOTAL) BY MOUTH AT BEDTIME. 09/19/15   Hendricks Limes, MD  metoprolol (LOPRESSOR) 50 MG tablet Take 1 tablet (50 mg total) by mouth 2 (two) times daily. 09/05/15   Brett Canales, PA-C  tamsulosin (FLOMAX) 0.4 MG CAPS capsule Take 0.4 mg by mouth  daily as needed.    Historical Provider, MD   There were no vitals taken for this visit. Physical Exam  Constitutional: He is oriented to person, place, and time. He appears well-developed and well-nourished. No distress.  Obese Caucasian male laying in bed nontoxic in appearance  HENT:  Head: Atraumatic.  Oral mucosa dry  Eyes: Conjunctivae are normal.  Neck: Neck supple.  Cardiovascular:  Irregularly irregular rhythm.  Pulmonary/Chest: Effort normal and breath sounds normal. He exhibits no tenderness.  Abdominal: Bowel sounds are normal. He exhibits distension. There is tenderness (Tenderness left lower quadrant pain without guarding or rebound tenderness.).  Neurological: He is alert and oriented to person, place, and time.  Skin: No rash noted.  Psychiatric: He has a normal mood and affect.  Nursing note and vitals reviewed.   ED Course  Procedures (including critical care time) Labs Review Labs Reviewed  COMPREHENSIVE METABOLIC PANEL - Abnormal; Notable for the following:    Glucose, Bld 140 (*)    Calcium 11.6 (*)    Total Protein 8.8 (*)    Total Bilirubin 1.5 (*)    GFR calc non Af Amer 57 (*)    All other components within normal limits  CBC - Abnormal; Notable for the following:    WBC 22.5 (*)    Hemoglobin 17.8 (*)    HCT 53.4 (*)    MCV 100.4 (*)    All other components within normal limits  URINALYSIS, ROUTINE W REFLEX MICROSCOPIC (NOT AT [facility]) - Abnormal; Notable for the following:    Color, Urine AMBER (*)    APPearance HAZY (*)    Hgb urine dipstick TRACE (*)    Bilirubin Urine SMALL (*)    Ketones, ur 15 (*)    Leukocytes, UA SMALL (*)    All other components within normal limits  URINE MICROSCOPIC-ADD ON - Abnormal; Notable for the following:    Squamous Epithelial / LPF 0-5 (*)    Bacteria, UA FEW (*)    Casts HYALINE CASTS (*)    All other components within normal limits  LIPASE, BLOOD  LACTIC ACID, PLASMA  I-STAT TROPOININ, ED    Imaging  Review Dg Chest 2 View  10/18/2015  CLINICAL DATA:  Chest pain with shortness of breath and nausea EXAM: CHEST  2 VIEW COMPARISON:  Chest radiograph December 11, 2013 and chest CT Feb 21, 2014 FINDINGS: There is no edema or consolidation. Heart size and pulmonary vascularity are normal. There is no appreciable adenopathy. Patient is status post coronary artery bypass grafting. No pneumothorax. There is slight degenerative change in the  thoracic spine. There is a benign exostosis arising from the medial proximal right humerus. IMPRESSION: No edema or consolidation. Electronically Signed   By: Lowella Grip III M.D.   On: 10/18/2015 07:15   Ct Abdomen Pelvis W Contrast  10/18/2015  CLINICAL DATA:  LEFT lower quadrant abdominal pain, diarrhea and nausea for last couple days, history hyperlipidemia, coronary artery disease post CABG, LEFT inguinal hernia, atrial fibrillation, gastric ulcer, BPH, former smoker EXAM: CT ABDOMEN AND PELVIS WITH CONTRAST TECHNIQUE: Multidetector CT imaging of the abdomen and pelvis was performed using the standard protocol following bolus administration of intravenous contrast. Sagittal and coronal MPR images reconstructed from axial data set. CONTRAST:  11mL OMNIPAQUE IOHEXOL 300 MG/ML SOLN IV. No oral contrast. COMPARISON:  09/11/2011 FINDINGS: Lung bases clear. 5 mm nonspecific low-attenuation focus dome of liver image 15, not visualized on prior noncontrast exam. Gas identified within portal venous branches in the lateral segment LEFT lobe with additional portal venous gas seen within portal and superior mesenteric veins as well as in perigastric veins. Stomach moderately distended by fluid and minimal food debris. No definite gastric pneumatosis, wall thickening, or mass identified. Remainder of liver, spleen, pancreas, kidneys, and adrenal glands normal. Scattered atherosclerotic calcifications aorta, iliac arteries and coronary arteries. Normal appendix. Mild sigmoid  diverticulosis without evidence of diverticulitis. Remaining bowel loops unremarkable. Normal appearing bladder and ureters. Mild prostatic enlargement gland 5.4 x 4.2 cm image 92. No mass, adenopathy, free air or free fluid. Small RIGHT inguinal hernia containing fat with apparent postsurgical changes of LEFT inguinal herniorrhaphy. Bones demineralized. IMPRESSION: Gas within portal venous branches within liver, portal and superior mesenteric veins, and within perigastric veins. Stomach appears distended but is otherwise normal in appearance without obvious ulcer, mass/wall thickening or gastric pneumatosis, though the distribution of the gas within perigastric veins suggests a gastric etiology. Portal venous gas can be seen with bowel ischemia, from barotrauma such as following endoscopy, from acute gastric distention, perforated gastric ulcer, COPD, and GI tract obstruction. The lack of additional significant findings suggests this could be related to a benign cause such as acute gastric distention and close clinical followup is recommended to determine the clinical significance of these findings. Sigmoid diverticulosis. Prostatic enlargement. RIGHT inguinal hernia. Findings called to Domenic Moras PA on 10/18/2015 at 0825 hours. Electronically Signed   By: Lavonia Dana M.D.   On: 10/18/2015 08:27   I have personally reviewed and evaluated these images and lab results as part of my medical decision-making.   EKG Interpretation None     ED ECG REPORT   Date: 10/18/2015  Rate: 108   Rhythm: atrial fibrillation  QRS Axis: normal  Intervals: normal  ST/T Wave abnormalities: normal  Conduction Disutrbances:none  Narrative Interpretation:   Old EKG Reviewed: unchanged  I have personally reviewed the EKG tracing and agree with the computerized printout as noted.   MDM   Final diagnoses:  Generalized abdominal pain  Distended abdomen  Leukocytosis  Dehydration    BP 123/81 mmHg  Pulse 113   Temp(Src) 97.6 F (36.4 C) (Oral)  Resp 22  SpO2 91%   6:48 AM Patient with persistent diarrhea for the past several days after overeating during the holiday. No recent antibiotic use therefore low suspicion for C. Difficile. Patient also developed left lower quadrant abdominal pain as well as midsternal chest pain.  Nausea without vomiting and having persistent diarrhea, therefore concern for possible blockage. Plan to obtain abdominal and pelvis CT scan for further evaluation to  rule out diverticulitis, SBO or other acute emergent abdominal pathology. Chest x-ray, EKG, and troponin ordered to assess cardiac etiology. IV fluid and pain medication given along with antinausea medication.  Care discussed with DR. Campos.  8:27 AM Patient has a distended abdomen with associated abdominal pain. He endorsed nausea but unable to vomit. Dr. Venora Maples has inserted a NG tube and able to retrieve approximately 300 mL of gastric content. Patient felt better. Evidence of leukocytosis with WBC 22.5. Hemoglobin is concentrated at 17.8 likely from dehydration. Chest x-ray is unremarkable. Abdominal and pelvis CT scan showing abnormal peri-hepatic and portal vein gas and a distended abdomen without any obvious pathology.  Portal venous gas can be seen in bowel ischemia, from barotrauma such as following endoscopy, from acute gastric distension, perforated gastric ulcer, COPD, and GI tract obstruction. Evidence of diverticulosis but no evidence of diverticulitis. No obvious signs of obstruction was noted.  8:49 AM Appreciate consultation from [place] from [place], she will consult surgeon Dr. Donne Hazel to assess pt in the ER.  She request medicine to be involve in pt's admission.  Will consult medicine.    9:02 AM I have consulted with [facility] and spoke with PA Ebony Hail who agrees to see pt in the ER and will admit under attending Dr. Marily Memos. Pt is aware of plan.     Domenic Moras, PA-C 10/18/15 [place], MD 10/19/15 [phone]

## 2015-10-18 NOTE — ED Notes (Signed)
Attempted report 

## 2015-10-18 NOTE — ED Notes (Signed)
Patient transported to X-ray 

## 2015-10-18 NOTE — Progress Notes (Signed)
Patient complaining of 7/10 constant throat pain due to NG tube.  Per patient Toradol that was received earlier helped, next dose not due til 0115.  Patient does not want to take morphine.  [facility] paged with this information.

## 2015-10-18 NOTE — ED Notes (Signed)
Pt states he started having diarrhea 2 days ago with a couple loose stools during the day. Went to bed about 2230 last night and then started having diarrhea every 10 minutes. Having abd and chest pain also since it started. Pt has had nausea but no vomiting.

## 2015-10-19 DIAGNOSIS — I4891 Unspecified atrial fibrillation: Secondary | ICD-10-CM

## 2015-10-19 DIAGNOSIS — I999 Unspecified disorder of circulatory system: Secondary | ICD-10-CM

## 2015-10-19 LAB — COMPREHENSIVE METABOLIC PANEL
ALBUMIN: 3.4 g/dL — AB (ref 3.5–5.0)
ALT: 16 U/L — ABNORMAL LOW (ref 17–63)
AST: 15 U/L (ref 15–41)
Alkaline Phosphatase: 70 U/L (ref 38–126)
Anion gap: 10 (ref 5–15)
BUN: 21 mg/dL — AB (ref 6–20)
CHLORIDE: 111 mmol/L (ref 101–111)
CO2: 21 mmol/L — AB (ref 22–32)
Calcium: 9 mg/dL (ref 8.9–10.3)
Creatinine, Ser: 0.85 mg/dL (ref 0.61–1.24)
GFR calc Af Amer: >60 mL/min (ref >60)
GFR calc non Af Amer: >60 mL/min (ref >60)
GLUCOSE: 100 mg/dL — AB (ref 65–99)
POTASSIUM: 4 mmol/L (ref 3.5–5.1)
SODIUM: 142 mmol/L (ref 135–145)
Total Bilirubin: 1.7 mg/dL — ABNORMAL HIGH (ref 0.3–1.2)
Total Protein: 6.4 g/dL — ABNORMAL LOW (ref 6.5–8.1)

## 2015-10-19 LAB — CBC
HEMATOCRIT: 46 % (ref 39.0–52.0)
Hemoglobin: 14.9 g/dL (ref 13.0–17.0)
MCH: 32.8 pg (ref 26.0–34.0)
MCHC: 32.4 g/dL (ref 30.0–36.0)
MCV: 101.3 fL — AB (ref 78.0–100.0)
Platelets: 221 10*3/uL (ref 150–400)
RBC: 4.54 MIL/uL (ref 4.22–5.81)
RDW: 12.9 % (ref 11.5–15.5)
WBC: 14.7 10*3/uL — AB (ref 4.0–10.5)

## 2015-10-19 LAB — C DIFFICILE QUICK SCREEN W PCR REFLEX
C DIFFICILE (CDIFF) TOXIN: NEGATIVE
C DIFFICLE (CDIFF) ANTIGEN: NEGATIVE
C Diff interpretation: NEGATIVE

## 2015-10-19 LAB — URINE CULTURE

## 2015-10-19 LAB — APTT: aPTT: 95 seconds — ABNORMAL HIGH (ref 24–37)

## 2015-10-19 LAB — LACTIC ACID, PLASMA: LACTIC ACID, VENOUS: 0.9 mmol/L (ref 0.5–2.0)

## 2015-10-19 LAB — HEPARIN LEVEL (UNFRACTIONATED): Heparin Unfractionated: 0.82 IU/mL — ABNORMAL HIGH (ref 0.30–0.70)

## 2015-10-19 MED ORDER — ATORVASTATIN CALCIUM 80 MG PO TABS
80.0000 mg | ORAL_TABLET | Freq: Every day | ORAL | Status: DC
  Administered 2015-10-19 – 2015-10-20 (×2): 80 mg via ORAL
  Filled 2015-10-19 (×2): qty 1

## 2015-10-19 MED ORDER — TAMSULOSIN HCL 0.4 MG PO CAPS
0.4000 mg | ORAL_CAPSULE | Freq: Every day | ORAL | Status: DC
  Administered 2015-10-19 – 2015-10-20 (×2): 0.4 mg via ORAL
  Filled 2015-10-19 (×2): qty 1

## 2015-10-19 MED ORDER — GABAPENTIN 300 MG PO CAPS
300.0000 mg | ORAL_CAPSULE | Freq: Every day | ORAL | Status: DC
  Administered 2015-10-19: 300 mg via ORAL
  Filled 2015-10-19: qty 1

## 2015-10-19 MED ORDER — APIXABAN 5 MG PO TABS
5.0000 mg | ORAL_TABLET | Freq: Two times a day (BID) | ORAL | Status: DC
  Administered 2015-10-19 – 2015-10-20 (×3): 5 mg via ORAL
  Filled 2015-10-19 (×3): qty 1

## 2015-10-19 MED ORDER — METOPROLOL TARTRATE 50 MG PO TABS
50.0000 mg | ORAL_TABLET | Freq: Two times a day (BID) | ORAL | Status: DC
  Administered 2015-10-20: 50 mg via ORAL
  Filled 2015-10-19 (×2): qty 1

## 2015-10-19 NOTE — Progress Notes (Signed)
Patient ID: Blake [name], male   DOB: [birth year], 69 y.o.   MRN: [id number]     [facility]      [address] [place], [place] [address]    Phone: [phone] FAX: [phone]     Subjective: Throat is sore.  No n/v, no abd pain, no diarrhea, no flatus.  c diff and gi panel pending.  WBC down to 14.7k from 22.5k.  Afebrile.   Objective:  Vital signs:  Filed Vitals:   10/18/15 1411 10/18/15 2121 10/19/15 0034 10/19/15 0549  BP: 108/72 126/74 111/63 117/68  Pulse: 106 101 106 103  Temp: 98.3 F (36.8 C) 97.7 F (36.5 C)  98.1 F (36.7 C)  TempSrc: Oral Oral  Oral  Resp: _0 Height:      Weight:    88.406 kg (194 lb 14.4 oz)  SpO2: 95% 96%  97%    Last BM Date: 10/18/15 (earlier today per patient )  Intake/Output   Yesterday:  12/27 0701 - 12/28 0700 In: 3344 [I.V.:3144; IV Piggyback:200] Out: 1750 [Urine:925; Emesis/NG output:825] This shift:    I/O last 3 completed shifts: In: 5248 [I.V.:3144; IV Piggyback:200] Out: 1750 [Urine:925; Emesis/NG output:825]       Physical Exam: General: Pt awake/alert/oriented x4 in no acute distress Abdomen: Soft.  Nondistended.  Non tender.  No evidence of peritonitis.  No incarcerated hernias.    Problem List:   Principal Problem:   Abdominal pain, vomiting, and diarrhea Active Problems:   Hyperlipidemia   Atrial fibrillation (HCC)   Coronary artery disease   BPH (benign prostatic hyperplasia)   Obesity (BMI 30-39.9)   Disorder of portal venous system   Dehydration, severe   HTN (hypertension)    Results:   Labs: Results for orders placed or performed during the hospital encounter of 10/18/15 (from the past 48 hour(s))  Lipase, blood     Status: None   Collection Time: 10/18/15  6:43 AM  Result Value Ref Range   Lipase 39 11 - 51 U/L  Comprehensive metabolic panel     Status: Abnormal   Collection Time: 10/18/15  6:43 AM  Result Value Ref Range    Sodium 141 135 - 145 mmol/L   Potassium 4.8 3.5 - 5.1 mmol/L   Chloride 107 101 - 111 mmol/L   CO2 23 22 - 32 mmol/L   Glucose, Bld 140 (H) 65 - 99 mg/dL   BUN 18 6 - 20 mg/dL   Creatinine, Ser 1.24 0.61 - 1.24 mg/dL   Calcium 11.6 (H) 8.9 - 10.3 mg/dL   Total Protein 8.8 (H) 6.5 - 8.1 g/dL   Albumin 4.9 3.5 - 5.0 g/dL   AST 26 15 - 41 U/L   ALT 24 17 - 63 U/L   Alkaline Phosphatase 87 38 - 126 U/L   Total Bilirubin 1.5 (H) 0.3 - 1.2 mg/dL   GFR calc non Af Amer 57 (L) >60 mL/min   GFR calc Af Amer >60 >60 mL/min    Comment: (NOTE) The eGFR has been calculated using the CKD EPI equation. This calculation has not been validated in all clinical situations. eGFR's persistently <60 mL/min signify possible Chronic Kidney Disease.    Anion gap 11 5 - 15  CBC     Status: Abnormal   Collection Time: 10/18/15  6:43 AM  Result Value Ref Range   WBC 22.5 (H) 4.0 - 10.5 K/uL   RBC 5.32  4.22 - 5.81 MIL/uL   Hemoglobin 17.8 (H) 13.0 - 17.0 g/dL   HCT 53.4 (H) 39.0 - 52.0 %   MCV 100.4 (H) 78.0 - 100.0 fL   MCH 33.5 26.0 - 34.0 pg   MCHC 33.3 30.0 - 36.0 g/dL   RDW 12.8 11.5 - 15.5 %   Platelets 278 150 - 400 K/uL  I-stat troponin, ED (not at [facility])     Status: None   Collection Time: 10/18/15  6:44 AM  Result Value Ref Range   Troponin i, poc 0.00 0.00 - 0.08 ng/mL   Comment 3            Comment: Due to the release kinetics of cTnI, a negative result within the first hours of the onset of symptoms does not rule out myocardial infarction with certainty. If myocardial infarction is still suspected, repeat the test at appropriate intervals.   Urinalysis, Routine w reflex microscopic (not at [facility])     Status: Abnormal   Collection Time: 10/18/15  7:01 AM  Result Value Ref Range   Color, Urine AMBER (A) YELLOW    Comment: MICROSCOPIC EXAM PERFORMED ON UNCONCENTRATED URINE BIOCHEMICALS MAY BE AFFECTED BY COLOR    APPearance HAZY (A) CLEAR   Specific Gravity, Urine 1.026  1.005 - 1.030   pH 5.0 5.0 - 8.0   Glucose, UA NEGATIVE NEGATIVE mg/dL   Hgb urine dipstick TRACE (A) NEGATIVE   Bilirubin Urine SMALL (A) NEGATIVE   Ketones, ur 15 (A) NEGATIVE mg/dL   Protein, ur NEGATIVE NEGATIVE mg/dL   Nitrite NEGATIVE NEGATIVE   Leukocytes, UA SMALL (A) NEGATIVE  Urine microscopic-add on     Status: Abnormal   Collection Time: 10/18/15  7:01 AM  Result Value Ref Range   Squamous Epithelial / LPF 0-5 (A) NONE SEEN   WBC, UA 6-30 0 - 5 WBC/hpf   RBC / HPF 0-5 0 - 5 RBC/hpf   Bacteria, UA FEW (A) NONE SEEN   Casts HYALINE CASTS (A) NEGATIVE   Urine-Other MUCOUS PRESENT     Comment: LESS THAN 10 mL OF URINE SUBMITTED  Lactic acid, plasma     Status: None   Collection Time: 10/18/15  9:29 AM  Result Value Ref Range   Lactic Acid, Venous 1.5 0.5 - 2.0 mmol/L  Heparin level (unfractionated)     Status: Abnormal   Collection Time: 10/18/15 10:56 AM  Result Value Ref Range   Heparin Unfractionated 0.83 (H) 0.30 - 0.70 IU/mL    Comment:        IF HEPARIN RESULTS ARE BELOW EXPECTED VALUES, AND PATIENT DOSAGE HAS BEEN CONFIRMED, SUGGEST FOLLOW UP TESTING OF ANTITHROMBIN III LEVELS.   APTT     Status: None   Collection Time: 10/18/15 10:56 AM  Result Value Ref Range   aPTT 31 24 - 37 seconds  APTT     Status: Abnormal   Collection Time: 10/18/15  6:42 PM  Result Value Ref Range   aPTT 84 (H) 24 - 37 seconds    Comment:        IF BASELINE aPTT IS ELEVATED, SUGGEST PATIENT RISK ASSESSMENT BE USED TO DETERMINE APPROPRIATE ANTICOAGULANT THERAPY.   Comprehensive metabolic panel     Status: Abnormal   Collection Time: 10/19/15  4:44 AM  Result Value Ref Range   Sodium 142 135 - 145 mmol/L   Potassium 4.0 3.5 - 5.1 mmol/L   Chloride 111 101 - 111 mmol/L   CO2 21 (L)  22 - 32 mmol/L   Glucose, Bld 100 (H) 65 - 99 mg/dL   BUN 21 (H) 6 - 20 mg/dL   Creatinine, Ser 0.85 0.61 - 1.24 mg/dL   Calcium 9.0 8.9 - 10.3 mg/dL    Comment: DELTA CHECK NOTED   Total  Protein 6.4 (L) 6.5 - 8.1 g/dL   Albumin 3.4 (L) 3.5 - 5.0 g/dL   AST 15 15 - 41 U/L   ALT 16 (L) 17 - 63 U/L   Alkaline Phosphatase 70 38 - 126 U/L   Total Bilirubin 1.7 (H) 0.3 - 1.2 mg/dL   GFR calc non Af Amer >60 >60 mL/min   GFR calc Af Amer >60 >60 mL/min    Comment: (NOTE) The eGFR has been calculated using the CKD EPI equation. This calculation has not been validated in all clinical situations. eGFR's persistently <60 mL/min signify possible Chronic Kidney Disease.    Anion gap 10 5 - 15  CBC     Status: Abnormal   Collection Time: 10/19/15  4:44 AM  Result Value Ref Range   WBC 14.7 (H) 4.0 - 10.5 K/uL   RBC 4.54 4.22 - 5.81 MIL/uL   Hemoglobin 14.9 13.0 - 17.0 g/dL   HCT 46.0 39.0 - 52.0 %   MCV 101.3 (H) 78.0 - 100.0 fL   MCH 32.8 26.0 - 34.0 pg   MCHC 32.4 30.0 - 36.0 g/dL   RDW 12.9 11.5 - 15.5 %   Platelets 221 150 - 400 K/uL  Heparin level (unfractionated)     Status: Abnormal   Collection Time: 10/19/15  4:44 AM  Result Value Ref Range   Heparin Unfractionated 0.82 (H) 0.30 - 0.70 IU/mL    Comment:        IF HEPARIN RESULTS ARE BELOW EXPECTED VALUES, AND PATIENT DOSAGE HAS BEEN CONFIRMED, SUGGEST FOLLOW UP TESTING OF ANTITHROMBIN III LEVELS.   APTT     Status: Abnormal   Collection Time: 10/19/15  4:44 AM  Result Value Ref Range   aPTT 95 (H) 24 - 37 seconds    Comment:        IF BASELINE aPTT IS ELEVATED, SUGGEST PATIENT RISK ASSESSMENT BE USED TO DETERMINE APPROPRIATE ANTICOAGULANT THERAPY.   Lactic acid, plasma     Status: None   Collection Time: 10/19/15  4:44 AM  Result Value Ref Range   Lactic Acid, Venous 0.9 0.5 - 2.0 mmol/L    Imaging / Studies: Dg Chest 2 View  10/18/2015  CLINICAL DATA:  Chest pain with shortness of breath and nausea EXAM: CHEST  2 VIEW COMPARISON:  Chest radiograph December 11, 2013 and chest CT Feb 21, 2014 FINDINGS: There is no edema or consolidation. Heart size and pulmonary vascularity are normal. There is no  appreciable adenopathy. Patient is status post coronary artery bypass grafting. No pneumothorax. There is slight degenerative change in the thoracic spine. There is a benign exostosis arising from the medial proximal right humerus. IMPRESSION: No edema or consolidation. Electronically Signed   By: Lowella Grip III M.D.   On: 10/18/2015 07:15   Ct Abdomen Pelvis W Contrast  10/18/2015  CLINICAL DATA:  LEFT lower quadrant abdominal pain, diarrhea and nausea for last couple days, history hyperlipidemia, coronary artery disease post CABG, LEFT inguinal hernia, atrial fibrillation, gastric ulcer, BPH, former smoker EXAM: CT ABDOMEN AND PELVIS WITH CONTRAST TECHNIQUE: Multidetector CT imaging of the abdomen and pelvis was performed using the standard protocol following bolus administration of intravenous contrast.  Sagittal and coronal MPR images reconstructed from axial data set. CONTRAST:  156m OMNIPAQUE IOHEXOL 300 MG/ML SOLN IV. No oral contrast. COMPARISON:  09/11/2011 FINDINGS: Lung bases clear. 5 mm nonspecific low-attenuation focus dome of liver image 15, not visualized on prior noncontrast exam. Gas identified within portal venous branches in the lateral segment LEFT lobe with additional portal venous gas seen within portal and superior mesenteric veins as well as in perigastric veins. Stomach moderately distended by fluid and minimal food debris. No definite gastric pneumatosis, wall thickening, or mass identified. Remainder of liver, spleen, pancreas, kidneys, and adrenal glands normal. Scattered atherosclerotic calcifications aorta, iliac arteries and coronary arteries. Normal appendix. Mild sigmoid diverticulosis without evidence of diverticulitis. Remaining bowel loops unremarkable. Normal appearing bladder and ureters. Mild prostatic enlargement gland 5.4 x 4.2 cm image 92. No mass, adenopathy, free air or free fluid. Small RIGHT inguinal hernia containing fat with apparent postsurgical changes of  LEFT inguinal herniorrhaphy. Bones demineralized. IMPRESSION: Gas within portal venous branches within liver, portal and superior mesenteric veins, and within perigastric veins. Stomach appears distended but is otherwise normal in appearance without obvious ulcer, mass/wall thickening or gastric pneumatosis, though the distribution of the gas within perigastric veins suggests a gastric etiology. Portal venous gas can be seen with bowel ischemia, from barotrauma such as following endoscopy, from acute gastric distention, perforated gastric ulcer, COPD, and GI tract obstruction. The lack of additional significant findings suggests this could be related to a benign cause such as acute gastric distention and close clinical followup is recommended to determine the clinical significance of these findings. Sigmoid diverticulosis. Prostatic enlargement. RIGHT inguinal hernia. Findings called to BDomenic MorasPA on 10/18/2015 at 0825 hours. Electronically Signed   By: MLavonia DanaM.D.   On: 10/18/2015 08:27    Medications / Allergies:  Scheduled Meds: . cefTRIAXone (ROCEPHIN)  IV  1 g Intravenous NOW  . cefTRIAXone (ROCEPHIN)  IV  2 g Intravenous Q24H  . metoprolol  5 mg Intravenous 4 times per day  . metronidazole  500 mg Intravenous Q8H  .  morphine injection  6 mg Intravenous Once  . sodium chloride  3 mL Intravenous Q12H   Continuous Infusions: . sodium chloride 100 mL/hr at 10/18/15 2233  . heparin 1,200 Units/hr (10/18/15 1050)   PRN Meds:.acetaminophen **OR** acetaminophen, hydrocortisone, ketorolac, morphine injection, ondansetron (ZOFRAN) IV **OR** promethazine, phenol, witch hazel-glycerin  Antibiotics: Anti-infectives    Start     Dose/Rate Route Frequency Ordered Stop   10/19/15 1100  cefTRIAXone (ROCEPHIN) 2 g in dextrose 5 % 50 mL IVPB     2 g 100 mL/hr over 30 Minutes Intravenous Every 24 hours 10/18/15 1039     10/18/15 1045  cefTRIAXone (ROCEPHIN) 1 g in dextrose 5 % 50 mL IVPB     1  g 100 mL/hr over 30 Minutes Intravenous NOW 10/18/15 1039 10/19/15 1045   10/18/15 0930  cefTRIAXone (ROCEPHIN) 1 g in dextrose 5 % 50 mL IVPB  Status:  Discontinued     1 g 100 mL/hr over 30 Minutes Intravenous Every 24 hours 10/18/15 0925 10/18/15 1039   10/18/15 0930  metroNIDAZOLE (FLAGYL) IVPB 500 mg     500 mg 100 mL/hr over 60 Minutes Intravenous Every 8 hours 10/18/15 04081         Assessment/Plan Diarrhea Leukocytosis Portal venous gas Abdominal pain Dehydration  Abdominal exam remains benign.  Likely gastroenteritis, low suspicion for bowel ischemia. WBC is down, afebrile.  Complains of NGT  discomfort.  DC NGT and allow for clears.  Continue with antibiotics.  Atrial fibrillation-heparin gtt, hold eliquis for another day   Erby Pian, [facility] Surgery Pager [phone](7A-4:30P) For consults and floor pages call [phone](7A-4:30P)  10/19/2015 8:13 AM

## 2015-10-19 NOTE — Plan of Care (Signed)
Problem: Education: Goal: Knowledge of [facility] Education information/materials will improve Outcome: Progressing RN provided medication education on all medications that have been administered thus far this shift.  RN discussed with patient pain management options and the numeric pain rating scale.  RN explained to patient the use of the call light system and patient has been complaint thus far this shift with using call light.  Patient has not attempted to get out of bed thus far this shift.

## 2015-10-19 NOTE — Plan of Care (Signed)
Problem: Bowel/Gastric: Goal: Will not experience complications related to bowel motility Outcome: Progressing Patient has not had any nausea/vomiting thus far this shift.  No stool thus far this shift.

## 2015-10-19 NOTE — Progress Notes (Signed)
ANTICOAGULATION CONSULT NOTE  Pharmacy Consult for heparin Indication: atrial fibrillation  No Known Allergies  Patient Measurements: Height: 5\' 6"  (167.6 cm) Weight: 194 lb 14.4 oz (88.406 kg) IBW/kg (Calculated) : 63.8 Heparin Dosing Weight: 83.9kg  Vital Signs: Temp: 98.1 F (36.7 C) (12/28 0549) Temp Source: Oral (12/28 0549) BP: 117/68 mmHg (12/28 0549) Pulse Rate: 103 (12/28 0549)  Labs:  Recent Labs  10/18/15 0643 10/18/15 1056 10/18/15 1842 10/19/15 0444  HGB 17.8*  --   --  14.9  HCT 53.4*  --   --  46.0  PLT 278  --   --  221  APTT  --  31 84* 95*  HEPARINUNFRC  --  0.83*  --  0.82*  CREATININE 1.24  --   --  0.85    Estimated Creatinine Clearance: 83 mL/min (by C-G formula based on Cr of 0.85).   Medical History: Past Medical History  Diagnosis Date  . Elevated PSA   . Hyperlipidemia   . HYPERLIPIDEMIA 05/25/2009  . BINGE EATING DISORDER 05/25/2009  . Chest pain 03/05/2011  . Obesity   . BPH (benign prostatic hyperplasia)   . Gastric ulcer   . CAD (coronary artery disease)   . Symptoms involving urinary system   . Urgency of urination   . Left inguinal hernia   . History of nephrolithiasis   . Angina     none since CABG  . Dysrhythmia     atrial fib post Cabg/ 5 days Coumadin only  . Chronic kidney disease     kidney stones years ago  . Blood transfusion     6/12  following GI Bleed with coumadin therapy  . Atrial fibrillation (HCC)     Medications:  Infusions:  . sodium chloride 100 mL/hr at 10/18/15 2233    Assessment: 7 yom presented to the ED with CP and diarrhea. He is on chronic apixaban for history of afib. His last dose was 12/26 in the evening. No further indication for surgery. Ok to resume apixaban for afib per MD.   Goal of Therapy:  aPTT 66-102 seconds Monitor platelets by anticoagulation protocol: Yes   Plan:    Dc heparin, labs Apixaban 5mg  PO BID  Onnie Boer, PharmD Pager: [phone] 10/19/2015 9:33  AM

## 2015-10-19 NOTE — Progress Notes (Signed)
[facility] Progress Note   Blake [name]  [id number]  DOB: April 25, 1946  DOA: 10/18/2015 PCP: Unice Cobble, MD  Brief narrative: Blake [name] is a 69 y.o. male with CAD s/p CABG in 2012, chronic A-fib on Eliquis, HTN and obesity. He presents with abdominal pain, vomiting and diarrhea and was found to have portal venous gas.    Subjective: Has not had a BM since being admitted.   Assessment/Plan: Principal Problem:   Abdominal pain, vomiting, and diarrhea- portal venous gas - cont Rocephin + Flagyl  for bacterial translocation - NG tube removed- diet started by surgery  Active Problems:    Atrial fibrillation / HTN - resume Apixaban and b blocker on hold duet to hypotension today    Coronary artery disease    BPH (benign prostatic hyperplasia) - cont Flomax  Dehydration - cont IVF today   Code Status:     Code Status Orders        Start     Ordered   10/18/15 1051  Full code   Continuous     10/18/15 1051     Family Communication:  Disposition Plan: home when tolerating diet  DVT prophylaxis: Eliquis Consultants: surgery Procedures:  Antibiotics: Anti-infectives    Start     Dose/Rate Route Frequency Ordered Stop   10/19/15 1100  cefTRIAXone (ROCEPHIN) 2 g in dextrose 5 % 50 mL IVPB     2 g 100 mL/hr over 30 Minutes Intravenous Every 24 hours 10/18/15 1039     10/18/15 1045  cefTRIAXone (ROCEPHIN) 1 g in dextrose 5 % 50 mL IVPB     1 g 100 mL/hr over 30 Minutes Intravenous NOW 10/18/15 1039 10/19/15 1045   10/18/15 0930  cefTRIAXone (ROCEPHIN) 1 g in dextrose 5 % 50 mL IVPB  Status:  Discontinued     1 g 100 mL/hr over 30 Minutes Intravenous Every 24 hours 10/18/15 0925 10/18/15 1039   10/18/15 0930  metroNIDAZOLE (FLAGYL) IVPB 500 mg     500 mg 100 mL/hr over 60 Minutes Intravenous Every 8 hours 10/18/15 0925        Objective: Filed Weights   10/18/15 0900 10/18/15 1040 10/19/15 0549  Weight: 93.5 kg (206 lb 2.1 oz) 89.812  kg (198 lb) 88.406 kg (194 lb 14.4 oz)    Intake/Output Summary (Last 24 hours) at 10/19/15 1230 Last data filed at 10/19/15 0700  Gross per 24 hour  Intake   2344 ml  Output   1500 ml  Net    844 ml     Vitals Filed Vitals:   10/18/15 1411 10/18/15 2121 10/19/15 0034 10/19/15 0549  BP: 108/72 126/74 111/63 117/68  Pulse: 106 101 106 103  Temp: 98.3 F (36.8 C) 97.7 F (36.5 C)  98.1 F (36.7 C)  TempSrc: Oral Oral  Oral  Resp: 18 18  18   Height:      Weight:    88.406 kg (194 lb 14.4 oz)  SpO2: 95% 96%  97%    Exam:  General:  Pt is alert, not in acute distress  HEENT: No icterus, No thrush, oral mucosa moist  Cardiovascular: regular rate and rhythm, S1/S2 No murmur  Respiratory: clear to auscultation bilaterally   Abdomen: Soft, +Bowel sounds, non tender, non distended, no guarding  MSK: No LE edema, cyanosis or clubbing  Data Reviewed: Basic Metabolic Panel:  Recent Labs Lab 10/18/15 0643 10/19/15 0444  NA 141 142  K 4.8 4.0  CL 107 111  CO2 23 21*  GLUCOSE 140* 100*  BUN 18 21*  CREATININE 1.24 0.85  CALCIUM 11.6* 9.0   Liver Function Tests:  Recent Labs Lab 10/18/15 0643 10/19/15 0444  AST 26 15  ALT 24 16*  ALKPHOS 87 70  BILITOT 1.5* 1.7*  PROT 8.8* 6.4*  ALBUMIN 4.9 3.4*    Recent Labs Lab 10/18/15 0643  LIPASE 39   No results for input(s): AMMONIA in the last 168 hours. CBC:  Recent Labs Lab 10/18/15 0643 10/19/15 0444  WBC 22.5* 14.7*  HGB 17.8* 14.9  HCT 53.4* 46.0  MCV 100.4* 101.3*  PLT 278 221   Cardiac Enzymes: No results for input(s): CKTOTAL, CKMB, CKMBINDEX, TROPONINI in the last 168 hours. BNP (last 3 results) No results for input(s): BNP in the last 8760 hours.  ProBNP (last 3 results) No results for input(s): PROBNP in the last 8760 hours.  CBG: No results for input(s): GLUCAP in the last 168 hours.  Recent Results (from the past 240 hour(s))  Urine culture     Status: None   Collection Time:  10/18/15  7:00 AM  Result Value Ref Range Status   Specimen Description URINE, RANDOM  Final   Special Requests NONE  Final   Culture 9,000 COLONIES/mL INSIGNIFICANT GROWTH  Final   Report Status 10/19/2015 FINAL  Final     Studies: Dg Chest 2 View  10/18/2015  CLINICAL DATA:  Chest pain with shortness of breath and nausea EXAM: CHEST  2 VIEW COMPARISON:  Chest radiograph December 11, 2013 and chest CT Feb 21, 2014 FINDINGS: There is no edema or consolidation. Heart size and pulmonary vascularity are normal. There is no appreciable adenopathy. Patient is status post coronary artery bypass grafting. No pneumothorax. There is slight degenerative change in the thoracic spine. There is a benign exostosis arising from the medial proximal right humerus. IMPRESSION: No edema or consolidation. Electronically Signed   By: Lowella Grip III M.D.   On: 10/18/2015 07:15   Ct Abdomen Pelvis W Contrast  10/18/2015  CLINICAL DATA:  LEFT lower quadrant abdominal pain, diarrhea and nausea for last couple days, history hyperlipidemia, coronary artery disease post CABG, LEFT inguinal hernia, atrial fibrillation, gastric ulcer, BPH, former smoker EXAM: CT ABDOMEN AND PELVIS WITH CONTRAST TECHNIQUE: Multidetector CT imaging of the abdomen and pelvis was performed using the standard protocol following bolus administration of intravenous contrast. Sagittal and coronal MPR images reconstructed from axial data set. CONTRAST:  1106mL OMNIPAQUE IOHEXOL 300 MG/ML SOLN IV. No oral contrast. COMPARISON:  09/11/2011 FINDINGS: Lung bases clear. 5 mm nonspecific low-attenuation focus dome of liver image 15, not visualized on prior noncontrast exam. Gas identified within portal venous branches in the lateral segment LEFT lobe with additional portal venous gas seen within portal and superior mesenteric veins as well as in perigastric veins. Stomach moderately distended by fluid and minimal food debris. No definite gastric  pneumatosis, wall thickening, or mass identified. Remainder of liver, spleen, pancreas, kidneys, and adrenal glands normal. Scattered atherosclerotic calcifications aorta, iliac arteries and coronary arteries. Normal appendix. Mild sigmoid diverticulosis without evidence of diverticulitis. Remaining bowel loops unremarkable. Normal appearing bladder and ureters. Mild prostatic enlargement gland 5.4 x 4.2 cm image 92. No mass, adenopathy, free air or free fluid. Small RIGHT inguinal hernia containing fat with apparent postsurgical changes of LEFT inguinal herniorrhaphy. Bones demineralized. IMPRESSION: Gas within portal venous branches within liver, portal and superior mesenteric veins, and within perigastric veins. Stomach appears distended but is otherwise normal  in appearance without obvious ulcer, mass/wall thickening or gastric pneumatosis, though the distribution of the gas within perigastric veins suggests a gastric etiology. Portal venous gas can be seen with bowel ischemia, from barotrauma such as following endoscopy, from acute gastric distention, perforated gastric ulcer, COPD, and GI tract obstruction. The lack of additional significant findings suggests this could be related to a benign cause such as acute gastric distention and close clinical followup is recommended to determine the clinical significance of these findings. Sigmoid diverticulosis. Prostatic enlargement. RIGHT inguinal hernia. Findings called to Domenic Moras PA on 10/18/2015 at 0825 hours. Electronically Signed   By: Lavonia Dana M.D.   On: 10/18/2015 08:27    Scheduled Meds:  Scheduled Meds: . apixaban  5 mg Oral BID  . atorvastatin  80 mg Oral Daily  . cefTRIAXone (ROCEPHIN)  IV  2 g Intravenous Q24H  . gabapentin  300 mg Oral QHS  . metoprolol  50 mg Oral BID  . metronidazole  500 mg Intravenous Q8H  .  morphine injection  6 mg Intravenous Once  . sodium chloride  3 mL Intravenous Q12H  . tamsulosin  0.4 mg Oral Daily    Continuous Infusions: . sodium chloride 100 mL/hr at 10/18/15 2233    Time spent on care of this patient: 81 min   [place], MD 10/19/2015, 12:30 PM  LOS: 1 day   [facility] Office  [phone] Pager - Text Page per [email or web address] If 7PM-7AM, please contact night-coverage [email or web address]

## 2015-10-20 LAB — GASTROINTESTINAL PANEL BY PCR, STOOL (REPLACES STOOL CULTURE)
Adenovirus F40/41: NOT DETECTED
Astrovirus: NOT DETECTED
Campylobacter species: NOT DETECTED
Cryptosporidium: NOT DETECTED
Cyclospora cayetanensis: NOT DETECTED
E. coli O157: NOT DETECTED
Entamoeba histolytica: NOT DETECTED
Enteroaggregative E coli (EAEC): DETECTED — AB
Enteropathogenic E coli (EPEC): NOT DETECTED
Enterotoxigenic E coli (ETEC): NOT DETECTED
Giardia lamblia: NOT DETECTED
Norovirus GI/GII: NOT DETECTED
Plesimonas shigelloides: NOT DETECTED
Rotavirus A: NOT DETECTED
Salmonella species: NOT DETECTED
Sapovirus (I, II, IV, and V): NOT DETECTED
Shiga like toxin producing E coli (STEC): NOT DETECTED
Shigella/Enteroinvasive E coli (EIEC): NOT DETECTED
Vibrio cholerae: NOT DETECTED
Vibrio species: NOT DETECTED
Yersinia enterocolitica: NOT DETECTED

## 2015-10-20 LAB — BASIC METABOLIC PANEL
ANION GAP: 7 (ref 5–15)
BUN: 11 mg/dL (ref 6–20)
CALCIUM: 8.2 mg/dL — AB (ref 8.9–10.3)
CHLORIDE: 111 mmol/L (ref 101–111)
CO2: 22 mmol/L (ref 22–32)
Creatinine, Ser: 0.71 mg/dL (ref 0.61–1.24)
GFR calc non Af Amer: >60 mL/min (ref >60)
Glucose, Bld: 89 mg/dL (ref 65–99)
POTASSIUM: 3.4 mmol/L — AB (ref 3.5–5.1)
Sodium: 140 mmol/L (ref 135–145)

## 2015-10-20 LAB — CBC
HEMATOCRIT: 41.6 % (ref 39.0–52.0)
HEMOGLOBIN: 13.5 g/dL (ref 13.0–17.0)
MCH: 32.6 pg (ref 26.0–34.0)
MCHC: 32.5 g/dL (ref 30.0–36.0)
MCV: 100.5 fL — ABNORMAL HIGH (ref 78.0–100.0)
Platelets: 196 10*3/uL (ref 150–400)
RBC: 4.14 MIL/uL — AB (ref 4.22–5.81)
RDW: 12.8 % (ref 11.5–15.5)
WBC: 8.1 10*3/uL (ref 4.0–10.5)

## 2015-10-20 MED ORDER — CIPROFLOXACIN HCL 500 MG PO TABS: 500.0000 mg | ORAL_TABLET | Freq: Two times a day (BID) | ORAL | Status: DC

## 2015-10-20 MED ORDER — METRONIDAZOLE 500 MG PO TABS: 500.0000 mg | ORAL_TABLET | Freq: Three times a day (TID) | ORAL | Status: DC

## 2015-10-20 MED ORDER — POTASSIUM CHLORIDE CRYS ER 20 MEQ PO TBCR
40.0000 meq | EXTENDED_RELEASE_TABLET | Freq: Once | ORAL | Status: AC
  Administered 2015-10-20: 40 meq via ORAL
  Filled 2015-10-20: qty 2

## 2015-10-20 MED ORDER — DILTIAZEM HCL 30 MG PO TABS: 30.0000 mg | ORAL_TABLET | Freq: Four times a day (QID) | ORAL | Status: DC | PRN

## 2015-10-20 NOTE — Progress Notes (Signed)
Notified Dr. Wynelle Cleveland pt stool sample positive for enteroaggregative e coli. Stated to notify pt he did not need to continue po flagyl. Call pt and given results and updated on meds to take with understanding. Carroll Kinds RN

## 2015-10-20 NOTE — Plan of Care (Signed)
Problem: Safety: Goal: Ability to remain free from injury will improve Outcome: Progressing Fall risk assessment completed this shift.  Patient categorized as a moderate fall risk.  RN instructed patient to call and wait for assistance before ambulating.  Patient stated understanding.  When patient was ready to get out of chair and go to bed, patient called for assistance and did wait for staff to assist.  Staff are providing a safe environment for patient.

## 2015-10-20 NOTE — Plan of Care (Signed)
Problem: Activity: Goal: Risk for activity intolerance will decrease Outcome: Progressing Patient sitting in chair when RN started shift; tolerated well.  Patient denied complaints of pain, no respiratory distress noted.

## 2015-10-20 NOTE — Discharge Summary (Addendum)
Physician Discharge Summary  Blake [name] [id number] DOB: 15-Feb-1946 DOA: 10/18/2015  PCP: Unice Cobble, MD  Admit date: 10/18/2015 Discharge date: 10/20/2015  Time spent: 45 minutes   Discharge Condition: stable    Discharge Diagnoses:  Principal Problem:   Abdominal pain, vomiting, and diarrhea Active Problems:   Hyperlipidemia   Atrial fibrillation (HCC)   Coronary artery disease   BPH (benign prostatic hyperplasia)   Obesity (BMI 30-39.9)   Disorder of portal venous system   Dehydration, severe   HTN (hypertension)   History of present illness:  Blake [name] is a 69 y.o. male with CAD s/p CABG in 2012, chronic A-fib on Eliquis, HTN and obesity. He presents with abdominal pain, vomiting and diarrhea and was found to have leukocytosis and a CT showing portal venous gas.    Hospital Course:  Principal Problem:  Abdominal pain, vomiting, and diarrhea (gastroenteritis)-  portal venous gas - NG placed on admission- surgery consulted- they did not feel he needed an intervention- he improved quickly with conservative management and had only 1 episode of diarrhea in the hospital - normal BM today  - WBC count improved from 22 to 8.1 - given Rocephin/ Flagyl in the hospital - stool PCR + for Enteroaggregative E coli - d/c home on oral Cipro  - NG tube removed- diet started by surgery- tolerating solids  Active Problems:   Atrial fibrillation / HTN - resume Apixaban and b blocker    Coronary artery disease - not on ASA as outpt - cont Statin   BPH (benign prostatic hyperplasia) - cont Flomax  Dehydration - resolved with IVF   Procedures:    Consultations:  gen surgery  Discharge Exam: Filed Weights   10/18/15 1040 10/19/15 0549 10/20/15 0518  Weight: 89.812 kg (198 lb) 88.406 kg (194 lb 14.4 oz) 89.54 kg (197 lb 6.4 oz)   Filed Vitals:   10/19/15 2209 10/20/15 0518  BP: 97/70 102/61  Pulse: 100 78  Temp:  98 F (36.7 C)  Resp:  16     General: AAO x 3, no distress Cardiovascular: RRR, no murmurs  Respiratory: clear to auscultation bilaterally GI: soft, non-tender, non-distended, bowel sound positive  Discharge Instructions You were cared for by a hospitalist during your hospital stay. If you have any questions about your discharge medications or the care you received while you were in the hospital after you are discharged, you can call the unit and asked to speak with the hospitalist on call if the hospitalist that took care of you is not available. Once you are discharged, your primary care physician will handle any further medical issues. Please note that NO REFILLS for any discharge medications will be authorized once you are discharged, as it is imperative that you return to your primary care physician (or establish a relationship with a primary care physician if you do not have one) for your aftercare needs so that they can reassess your need for medications and monitor your lab values.      Discharge Instructions    Diet - low sodium heart healthy    Complete by:  As directed      Increase activity slowly    Complete by:  As directed             Medication List    TAKE these medications        apixaban 5 MG Tabs tablet  Commonly known as:  ELIQUIS  Take 1 tablet (5 mg total) by  mouth 2 (two) times daily.     atorvastatin 80 MG tablet  Commonly known as:  LIPITOR  Take 1 tablet (80 mg total) by mouth daily.     ciprofloxacin 500 MG tablet  Commonly known as:  CIPRO  Take 1 tablet (500 mg total) by mouth 2 (two) times daily.     gabapentin 300 MG capsule  Commonly known as:  NEURONTIN  TAKE 1 CAPSULE (300 MG TOTAL) BY MOUTH AT BEDTIME.     metoprolol 50 MG tablet  Commonly known as:  LOPRESSOR  Take 1 tablet (50 mg total) by mouth 2 (two) times daily.     metroNIDAZOLE 500 MG tablet  Commonly known as:  FLAGYL  Take 1 tablet (500 mg total) by mouth 3 (three) times daily.     tamsulosin 0.4  MG Caps capsule  Commonly known as:  FLOMAX  Take 0.4 mg by mouth daily.       No Known Allergies    The results of significant diagnostics from this hospitalization (including imaging, microbiology, ancillary and laboratory) are listed below for reference.    Significant Diagnostic Studies: Dg Chest 2 View  10/18/2015  CLINICAL DATA:  Chest pain with shortness of breath and nausea EXAM: CHEST  2 VIEW COMPARISON:  Chest radiograph December 11, 2013 and chest CT Feb 21, 2014 FINDINGS: There is no edema or consolidation. Heart size and pulmonary vascularity are normal. There is no appreciable adenopathy. Patient is status post coronary artery bypass grafting. No pneumothorax. There is slight degenerative change in the thoracic spine. There is a benign exostosis arising from the medial proximal right humerus. IMPRESSION: No edema or consolidation. Electronically Signed   By: Lowella Grip III M.D.   On: 10/18/2015 07:15   Ct Abdomen Pelvis W Contrast  10/18/2015  CLINICAL DATA:  LEFT lower quadrant abdominal pain, diarrhea and nausea for last couple days, history hyperlipidemia, coronary artery disease post CABG, LEFT inguinal hernia, atrial fibrillation, gastric ulcer, BPH, former smoker EXAM: CT ABDOMEN AND PELVIS WITH CONTRAST TECHNIQUE: Multidetector CT imaging of the abdomen and pelvis was performed using the standard protocol following bolus administration of intravenous contrast. Sagittal and coronal MPR images reconstructed from axial data set. CONTRAST:  158mL OMNIPAQUE IOHEXOL 300 MG/ML SOLN IV. No oral contrast. COMPARISON:  09/11/2011 FINDINGS: Lung bases clear. 5 mm nonspecific low-attenuation focus dome of liver image 15, not visualized on prior noncontrast exam. Gas identified within portal venous branches in the lateral segment LEFT lobe with additional portal venous gas seen within portal and superior mesenteric veins as well as in perigastric veins. Stomach moderately distended  by fluid and minimal food debris. No definite gastric pneumatosis, wall thickening, or mass identified. Remainder of liver, spleen, pancreas, kidneys, and adrenal glands normal. Scattered atherosclerotic calcifications aorta, iliac arteries and coronary arteries. Normal appendix. Mild sigmoid diverticulosis without evidence of diverticulitis. Remaining bowel loops unremarkable. Normal appearing bladder and ureters. Mild prostatic enlargement gland 5.4 x 4.2 cm image 92. No mass, adenopathy, free air or free fluid. Small RIGHT inguinal hernia containing fat with apparent postsurgical changes of LEFT inguinal herniorrhaphy. Bones demineralized. IMPRESSION: Gas within portal venous branches within liver, portal and superior mesenteric veins, and within perigastric veins. Stomach appears distended but is otherwise normal in appearance without obvious ulcer, mass/wall thickening or gastric pneumatosis, though the distribution of the gas within perigastric veins suggests a gastric etiology. Portal venous gas can be seen with bowel ischemia, from barotrauma such as following endoscopy, from acute  gastric distention, perforated gastric ulcer, COPD, and GI tract obstruction. The lack of additional significant findings suggests this could be related to a benign cause such as acute gastric distention and close clinical followup is recommended to determine the clinical significance of these findings. Sigmoid diverticulosis. Prostatic enlargement. RIGHT inguinal hernia. Findings called to Domenic Moras PA on 10/18/2015 at 0825 hours. Electronically Signed   By: Lavonia Dana M.D.   On: 10/18/2015 08:27    Microbiology: Recent Results (from the past 240 hour(s))  Urine culture     Status: None   Collection Time: 10/18/15  7:00 AM  Result Value Ref Range Status   Specimen Description URINE, RANDOM  Final   Special Requests NONE  Final   Culture 9,000 COLONIES/mL INSIGNIFICANT GROWTH  Final   Report Status 10/19/2015 FINAL   Final  C difficile quick scan w PCR reflex     Status: None   Collection Time: 10/19/15  1:25 PM  Result Value Ref Range Status   C Diff antigen NEGATIVE NEGATIVE Final   C Diff toxin NEGATIVE NEGATIVE Final   C Diff interpretation Negative for toxigenic C. difficile  Final     Labs: Basic Metabolic Panel:  Recent Labs Lab 10/18/15 0643 10/19/15 0444 10/20/15 0344  NA 141 142 140  K 4.8 4.0 3.4*  CL 107 111 111  CO2 23 21* 22  GLUCOSE 140* 100* 89  BUN 18 21* 11  CREATININE 1.24 0.85 0.71  CALCIUM 11.6* 9.0 8.2*   Liver Function Tests:  Recent Labs Lab 10/18/15 0643 10/19/15 0444  AST 26 15  ALT 24 16*  ALKPHOS 87 70  BILITOT 1.5* 1.7*  PROT 8.8* 6.4*  ALBUMIN 4.9 3.4*    Recent Labs Lab 10/18/15 0643  LIPASE 39   No results for input(s): AMMONIA in the last 168 hours. CBC:  Recent Labs Lab 10/18/15 0643 10/19/15 0444 10/20/15 0344  WBC 22.5* 14.7* 8.1  HGB 17.8* 14.9 13.5  HCT 53.4* 46.0 41.6  MCV 100.4* 101.3* 100.5*  PLT 278 221 196   Cardiac Enzymes: No results for input(s): CKTOTAL, CKMB, CKMBINDEX, TROPONINI in the last 168 hours. BNP: BNP (last 3 results) No results for input(s): BNP in the last 8760 hours.  ProBNP (last 3 results) No results for input(s): PROBNP in the last 8760 hours.  CBG: No results for input(s): GLUCAP in the last 168 hours.     SignedDebbe Odea, MD [facility] 10/20/2015, 12:36 PM

## 2015-10-20 NOTE — Progress Notes (Signed)
UR Completed Danne Scardina Graves-Bigelow, RN,BSN [phone]  

## 2015-10-25 ENCOUNTER — Telehealth: Payer: Self-pay | Admitting: *Deleted

## 2015-10-25 ENCOUNTER — Telehealth: Payer: Self-pay | Admitting: Internal Medicine

## 2015-10-25 DIAGNOSIS — I4891 Unspecified atrial fibrillation: Secondary | ICD-10-CM

## 2015-10-25 DIAGNOSIS — R002 Palpitations: Secondary | ICD-10-CM

## 2015-10-25 DIAGNOSIS — K259 Gastric ulcer, unspecified as acute or chronic, without hemorrhage or perforation: Secondary | ICD-10-CM

## 2015-10-25 DIAGNOSIS — I480 Paroxysmal atrial fibrillation: Secondary | ICD-10-CM

## 2015-10-25 DIAGNOSIS — I2510 Atherosclerotic heart disease of native coronary artery without angina pectoris: Secondary | ICD-10-CM

## 2015-10-25 MED ORDER — ATORVASTATIN CALCIUM 80 MG PO TABS: 80.0000 mg | ORAL_TABLET | Freq: Every day | ORAL | Status: DC

## 2015-10-25 MED ORDER — APIXABAN 5 MG PO TABS: 5.0000 mg | ORAL_TABLET | Freq: Two times a day (BID) | ORAL | Status: DC

## 2015-10-25 MED ORDER — GABAPENTIN 300 MG PO CAPS: 300.0000 mg | ORAL_CAPSULE | Freq: Every day | ORAL | Status: DC

## 2015-10-25 MED ORDER — METOPROLOL TARTRATE 50 MG PO TABS: 50.0000 mg | ORAL_TABLET | Freq: Two times a day (BID) | ORAL | Status: DC

## 2015-10-25 NOTE — Telephone Encounter (Signed)
Left msg on triage stating he has change insurance and needing to get all his rx's sent to [facility] especially need Eliquis only have 1 pill left. Called pt inform him that he need to get establish with a new PCP before any rx's can be sent to [facility]. Pt then got a little loud and stated that he don't understand why he need to see someone for them to refill his medications that he been taking over 5 years. He has been seeing Dr. Linna Darner for 25 years now everything is in his chart. Again i tried to explain to pt he has to estab with another MD because they have never seen him. Pt then got angry and stated well what I'm going to do about getting my medicine. I then transferred pt to schedulers to get estab. Also went to let [place] know about msg, and how rude pt was. We can send in 30 day to his local pharmacy if he wishes...[name]

## 2015-10-25 NOTE — Telephone Encounter (Signed)
Contacted pt to verify the Blake [name] address.  Pharmacy list has been updated and erx's sent.

## 2015-10-25 NOTE — Telephone Encounter (Signed)
Patient has appt to establish care next week and needs 10 day rx for eloquis sent to [facility] and 90 day supply sent to [facility]. He has 1 day supply left. Previous request that was approved by Dr. Stanford Breed and sent to [facility] needs to be canceled bc patient's insurance will not pay at [facility].

## 2015-10-27 NOTE — Telephone Encounter (Signed)
Fax sent from [facility] for MD signature to fill medication signed by Marya Amsler. I will have the form ready to send after the OV takes place.

## 2015-10-31 ENCOUNTER — Encounter: Payer: Medicare Other | Admitting: Family

## 2015-11-02 ENCOUNTER — Telehealth: Payer: Self-pay | Admitting: Internal Medicine

## 2015-11-02 NOTE — Telephone Encounter (Signed)
Patient is establishing with Dr. Sharlet [name] on 1/16th at [place].  He previously had appointment with Blake [name] but did not realize Blake [name] was an FNP and he preferred an MD.  [facility] states that [facility] received scripts for eliquis, lipitor and metoprolol but it did not have a provider name on the scripts so they could not fill.  Patient needs these scripts sent back to the the pharmacy so they can be filled with a provider name on them.  Also, patient will be out of eliquis soon and will not have enough time to get med from mail order.  Patient is requesting script of eliquis to be sent to Blake [name] at [address]

## 2015-11-02 NOTE — Telephone Encounter (Signed)
Since these were sent in by Greg on 10/25/15 can we call [facility] and have them fill those scripts. Can address at visit. Does not need additional script at this time.

## 2015-11-03 ENCOUNTER — Encounter: Payer: Medicare Other | Admitting: Family

## 2015-11-03 ENCOUNTER — Encounter: Payer: Self-pay | Admitting: Cardiology

## 2015-11-03 NOTE — Telephone Encounter (Signed)
Pt calling requesting a prior auth on medication Eliquis 5 mg tablet. Please advise

## 2015-11-04 NOTE — Telephone Encounter (Signed)
Spoke with patient and will discuss all medication refills at the visit on 11/06/14.

## 2015-11-07 ENCOUNTER — Ambulatory Visit (INDEPENDENT_AMBULATORY_CARE_PROVIDER_SITE_OTHER): Payer: Medicare Other | Admitting: Internal Medicine

## 2015-11-07 ENCOUNTER — Encounter: Payer: Self-pay | Admitting: Internal Medicine

## 2015-11-07 ENCOUNTER — Other Ambulatory Visit (INDEPENDENT_AMBULATORY_CARE_PROVIDER_SITE_OTHER): Payer: Medicare Other

## 2015-11-07 VITALS — BP 108/62 | HR 95 | Temp 98.6°F | Resp 18 | Ht 66.0 in | Wt 196.0 lb

## 2015-11-07 DIAGNOSIS — I2510 Atherosclerotic heart disease of native coronary artery without angina pectoris: Secondary | ICD-10-CM

## 2015-11-07 DIAGNOSIS — K259 Gastric ulcer, unspecified as acute or chronic, without hemorrhage or perforation: Secondary | ICD-10-CM | POA: Diagnosis not present

## 2015-11-07 DIAGNOSIS — N400 Enlarged prostate without lower urinary tract symptoms: Secondary | ICD-10-CM

## 2015-11-07 DIAGNOSIS — E669 Obesity, unspecified: Secondary | ICD-10-CM

## 2015-11-07 DIAGNOSIS — I4891 Unspecified atrial fibrillation: Secondary | ICD-10-CM

## 2015-11-07 DIAGNOSIS — E785 Hyperlipidemia, unspecified: Secondary | ICD-10-CM

## 2015-11-07 DIAGNOSIS — I480 Paroxysmal atrial fibrillation: Secondary | ICD-10-CM | POA: Diagnosis not present

## 2015-11-07 DIAGNOSIS — R938 Abnormal findings on diagnostic imaging of other specified body structures: Secondary | ICD-10-CM

## 2015-11-07 DIAGNOSIS — R9389 Abnormal findings on diagnostic imaging of other specified body structures: Secondary | ICD-10-CM

## 2015-11-07 DIAGNOSIS — R002 Palpitations: Secondary | ICD-10-CM

## 2015-11-07 DIAGNOSIS — Z0000 Encounter for general adult medical examination without abnormal findings: Secondary | ICD-10-CM

## 2015-11-07 LAB — COMPREHENSIVE METABOLIC PANEL
ALBUMIN: 4.4 g/dL (ref 3.5–5.2)
ALK PHOS: 76 U/L (ref 39–117)
ALT: 23 U/L (ref 0–53)
AST: 22 U/L (ref 0–37)
BILIRUBIN TOTAL: 1.9 mg/dL — AB (ref 0.2–1.2)
BUN: 17 mg/dL (ref 6–23)
CALCIUM: 9.7 mg/dL (ref 8.4–10.5)
CO2: 21 meq/L (ref 19–32)
CREATININE: 0.79 mg/dL (ref 0.40–1.50)
Chloride: 104 mEq/L (ref 96–112)
GFR: 102.61 mL/min (ref >60.00)
Glucose, Bld: 75 mg/dL (ref 70–99)
Potassium: 3.9 mEq/L (ref 3.5–5.1)
Sodium: 140 mEq/L (ref 135–145)
TOTAL PROTEIN: 7.5 g/dL (ref 6.0–8.3)

## 2015-11-07 LAB — CBC
HCT: 46 % (ref 39.0–52.0)
Hemoglobin: 15.6 g/dL (ref 13.0–17.0)
MCHC: 33.9 g/dL (ref 30.0–36.0)
MCV: 96.7 fl (ref 78.0–100.0)
PLATELETS: 239 10*3/uL (ref 150.0–400.0)
RBC: 4.76 Mil/uL (ref 4.22–5.81)
RDW: 12.9 % (ref 11.5–15.5)
WBC: 9.4 10*3/uL (ref 4.0–10.5)

## 2015-11-07 LAB — LIPID PANEL
CHOL/HDL RATIO: 3
CHOLESTEROL: 98 mg/dL (ref 0–200)
HDL: 33.9 mg/dL — AB (ref >39.00)
LDL Cholesterol: 52 mg/dL (ref 0–99)
NonHDL: 63.79
TRIGLYCERIDES: 58 mg/dL (ref 0.0–149.0)
VLDL: 11.6 mg/dL (ref 0.0–40.0)

## 2015-11-07 LAB — HEMOGLOBIN A1C: HEMOGLOBIN A1C: 5.8 % (ref 4.6–6.5)

## 2015-11-07 LAB — TSH: TSH: 0.82 u[IU]/mL (ref 0.35–4.50)

## 2015-11-07 MED ORDER — ATORVASTATIN CALCIUM 80 MG PO TABS: 80.0000 mg | ORAL_TABLET | Freq: Every day | ORAL | Status: DC

## 2015-11-07 MED ORDER — GABAPENTIN 300 MG PO CAPS: 300.0000 mg | ORAL_CAPSULE | Freq: Every day | ORAL | Status: DC

## 2015-11-07 MED ORDER — METOPROLOL TARTRATE 50 MG PO TABS: 50.0000 mg | ORAL_TABLET | Freq: Two times a day (BID) | ORAL | Status: DC

## 2015-11-07 MED ORDER — APIXABAN 5 MG PO TABS: 5.0000 mg | ORAL_TABLET | Freq: Two times a day (BID) | ORAL | Status: DC

## 2015-11-07 NOTE — Patient Instructions (Signed)
We have sent in your medicines and will check your labs today.   Remember to check if you have gotten the prevnar 13 also called the pneumonia booster shot. If not then that would be due.   Health Maintenance, Male A healthy lifestyle and preventative care can promote health and wellness.  Maintain regular health, dental, and eye exams.  Eat a healthy diet. Foods like vegetables, fruits, whole grains, low-fat dairy products, and lean protein foods contain the nutrients you need and are low in calories. Decrease your intake of foods high in solid fats, added sugars, and salt. Get information about a proper diet from your health care provider, if necessary.  Regular physical exercise is one of the most important things you can do for your health. Most adults should get at least 150 minutes of moderate-intensity exercise (any activity that increases your heart rate and causes you to sweat) each week. In addition, most adults need muscle-strengthening exercises on 2 or more days a week.   Maintain a healthy weight. The body mass index (BMI) is a screening tool to identify possible weight problems. It provides an estimate of body fat based on height and weight. Your health care provider can find your BMI and can help you achieve or maintain a healthy weight. For males 20 years and older:  A BMI below 18.5 is considered underweight.  A BMI of 18.5 to 24.9 is normal.  A BMI of 25 to 29.9 is considered overweight.  A BMI of 30 and above is considered obese.  Maintain normal blood lipids and cholesterol by exercising and minimizing your intake of saturated fat. Eat a balanced diet with plenty of fruits and vegetables. Blood tests for lipids and cholesterol should begin at age 49 and be repeated every 5 years. If your lipid or cholesterol levels are high, you are over age 7, or you are at high risk for heart disease, you may need your cholesterol levels checked more frequently.Ongoing high lipid and  cholesterol levels should be treated with medicines if diet and exercise are not working.  If you smoke, find out from your health care provider how to quit. If you do not use tobacco, do not start.  Lung cancer screening is recommended for adults aged 104-80 years who are at high risk for developing lung cancer because of a history of smoking. A yearly low-dose CT scan of the lungs is recommended for people who have at least a 30-pack-year history of smoking and are current smokers or have quit within the past 15 years. A pack year of smoking is smoking an average of 1 pack of cigarettes a day for 1 year (for example, a 30-pack-year history of smoking could mean smoking 1 pack a day for 30 years or 2 packs a day for 15 years). Yearly screening should continue until the smoker has stopped smoking for at least 15 years. Yearly screening should be stopped for people who develop a health problem that would prevent them from having lung cancer treatment.  If you choose to drink alcohol, do not have more than 2 drinks per day. One drink is considered to be 12 oz (360 mL) of beer, 5 oz (150 mL) of wine, or 1.5 oz (45 mL) of liquor.  Avoid the use of street drugs. Do not share needles with anyone. Ask for help if you need support or instructions about stopping the use of drugs.  High blood pressure causes heart disease and increases the risk of stroke.  High blood pressure is more likely to develop in:  People who have blood pressure in the end of the normal range (100-139/85-89 mm Hg).  People who are overweight or obese.  People who are African American.  If you are 53-75 years of age, have your blood pressure checked every 3-5 years. If you are 37 years of age or older, have your blood pressure checked every year. You should have your blood pressure measured twice--once when you are at a hospital or clinic, and once when you are not at a hospital or clinic. Record the average of the two measurements. To  check your blood pressure when you are not at a hospital or clinic, you can use:  An automated blood pressure machine at a pharmacy.  A home blood pressure monitor.  If you are 45-3 years old, ask your health care provider if you should take aspirin to prevent heart disease.  Diabetes screening involves taking a blood sample to check your fasting blood sugar level. This should be done once every 3 years after age 53 if you are at a normal weight and without risk factors for diabetes. Testing should be considered at a younger age or be carried out more frequently if you are overweight and have at least 1 risk factor for diabetes.  Colorectal cancer can be detected and often prevented. Most routine colorectal cancer screening begins at the age of 38 and continues through age 69. However, your health care provider may recommend screening at an earlier age if you have risk factors for colon cancer. On a yearly basis, your health care provider may provide home test kits to check for hidden blood in the stool. A small camera at the end of a tube may be used to directly examine the colon (sigmoidoscopy or colonoscopy) to detect the earliest forms of colorectal cancer. Talk to your health care provider about this at age 76 when routine screening begins. A direct exam of the colon should be repeated every 5-10 years through age 52, unless early forms of precancerous polyps or small growths are found.  People who are at an increased risk for hepatitis B should be screened for this virus. You are considered at high risk for hepatitis B if:  You were born in a country where hepatitis B occurs often. Talk with your health care provider about which countries are considered high risk.  Your parents were born in a high-risk country and you have not received a shot to protect against hepatitis B (hepatitis B vaccine).  You have HIV or AIDS.  You use needles to inject street drugs.  You live with, or have sex  with, someone who has hepatitis B.  You are a man who has sex with other men (MSM).  You get hemodialysis treatment.  You take certain medicines for conditions like cancer, organ transplantation, and autoimmune conditions.  Hepatitis C blood testing is recommended for all people born from [birth year] through 1967 and any individual with known risk factors for hepatitis C.  Healthy men should no longer receive prostate-specific antigen (PSA) blood tests as part of routine cancer screening. Talk to your health care provider about prostate cancer screening.  Testicular cancer screening is not recommended for adolescents or adult males who have no symptoms. Screening includes self-exam, a health care provider exam, and other screening tests. Consult with your health care provider about any symptoms you have or any concerns you have about testicular cancer.  Practice safe sex. Use condoms  and avoid high-risk sexual practices to reduce the spread of sexually transmitted infections (STIs).  You should be screened for STIs, including gonorrhea and chlamydia if:  You are sexually active and are younger than 24 years.  You are older than 24 years, and your health care provider tells you that you are at risk for this type of infection.  Your sexual activity has changed since you were last screened, and you are at an increased risk for chlamydia or gonorrhea. Ask your health care provider if you are at risk.  If you are at risk of being infected with HIV, it is recommended that you take a prescription medicine daily to prevent HIV infection. This is called pre-exposure prophylaxis (PrEP). You are considered at risk if:  You are a man who has sex with other men (MSM).  You are a heterosexual man who is sexually active with multiple partners.  You take drugs by injection.  You are sexually active with a partner who has HIV.  Talk with your health care provider about whether you are at high risk of being  infected with HIV. If you choose to begin PrEP, you should first be tested for HIV. You should then be tested every 3 months for as long as you are taking PrEP.  Use sunscreen. Apply sunscreen liberally and repeatedly throughout the day. You should seek shade when your shadow is shorter than you. Protect yourself by wearing long sleeves, pants, a wide-brimmed hat, and sunglasses year round whenever you are outdoors.  Tell your health care provider of new moles or changes in moles, especially if there is a change in shape or color. Also, tell your health care provider if a mole is larger than the size of a pencil eraser.  A one-time screening for abdominal aortic aneurysm (AAA) and surgical repair of large AAAs by ultrasound is recommended for men aged 25-75 years who are current or former smokers.  Stay current with your vaccines (immunizations).   This information is not intended to replace advice given to you by your health care provider. Make sure you discuss any questions you have with your health care provider.   Document Released: 04/05/2008 Document Revised: 10/29/2014 Document Reviewed: 03/05/2011 Elsevier Interactive Patient Education [employer].

## 2015-11-07 NOTE — Progress Notes (Signed)
Pre visit review using our clinic review tool, if applicable. No additional management support is needed unless otherwise documented below in the visit note. 

## 2015-11-07 NOTE — Progress Notes (Signed)
   Subjective:    Patient ID: Blake [name], male    DOB: [birth year], 70 y.o.   MRN: [id number]  HPI Here for medicare wellness, no new complaints. Please see A/P for status and treatment of chronic medical problems.   HPI#2: Coming in for follow up of his medical problems including his BPH, hyperlipidemia. Not having any side effects with medication. Was in the hospital in December for acute GI illness and dehydration but fully recovered now. Strength and diet back to normal. Bowels back to normal.   Diet: heart healthy  Physical activity: sedentary Depression/mood screen: negative Hearing: intact to whispered voice Visual acuity: grossly normal, performs annual eye exam  ADLs: capable Fall risk: none Home safety: good Cognitive evaluation: intact to orientation, naming, recall and repetition EOL planning: adv directives discussed  I have personally reviewed and have noted 1. The patient's medical and social history - reviewed today no changes 2. Their use of alcohol, tobacco or illicit drugs 3. Their current medications and supplements 4. The patient's functional ability including ADL's, fall risks, home safety risks and hearing or visual impairment. 5. Diet and physical activities 6. Evidence for depression or mood disorders 7. Care team reviewed and updated (available in snapshot)  Review of Systems  Constitutional: Negative for fever, activity change, appetite change, fatigue and unexpected weight change.  HENT: Negative.   Eyes: Negative.   Respiratory: Negative for cough, chest tightness, shortness of breath and wheezing.   Cardiovascular: Negative for chest pain, palpitations and leg swelling.  Gastrointestinal: Negative for nausea, abdominal pain, diarrhea, constipation, blood in stool and abdominal distention.  Genitourinary: Positive for frequency.  Musculoskeletal: Negative for back pain, joint swelling and arthralgias.  Skin: Negative.   Neurological: Negative.     Psychiatric/Behavioral: Negative.       Objective:   Physical Exam  Constitutional: He is oriented to person, place, and time. He appears well-developed and well-nourished.  HENT:  Head: Normocephalic and atraumatic.  Eyes: EOM are normal.  Neck: Normal range of motion.  Cardiovascular: Normal rate and regular rhythm.   Pulmonary/Chest: Effort normal and breath sounds normal. No respiratory distress. He has no wheezes. He has no rales.  Abdominal: Soft. Bowel sounds are normal. He exhibits no distension. There is no tenderness. There is no rebound.  Musculoskeletal: He exhibits no edema.  Neurological: He is alert and oriented to person, place, and time. Coordination normal.  Skin: Skin is warm and dry.  Psychiatric: He has a normal mood and affect.   Filed Vitals:   11/07/15 1607  BP: 108/62  Pulse: 95  Temp: 98.6 F (37 C)  TempSrc: Oral  Resp: 18  Height: 5\' 6"  (1.676 m)  Weight: 196 lb (88.905 kg)  SpO2: 95%      Assessment & Plan:

## 2015-11-08 ENCOUNTER — Telehealth: Payer: Self-pay | Admitting: Internal Medicine

## 2015-11-08 ENCOUNTER — Encounter: Payer: Self-pay | Admitting: Internal Medicine

## 2015-11-08 DIAGNOSIS — K259 Gastric ulcer, unspecified as acute or chronic, without hemorrhage or perforation: Secondary | ICD-10-CM

## 2015-11-08 DIAGNOSIS — Z0001 Encounter for general adult medical examination with abnormal findings: Secondary | ICD-10-CM | POA: Insufficient documentation

## 2015-11-08 DIAGNOSIS — I480 Paroxysmal atrial fibrillation: Secondary | ICD-10-CM

## 2015-11-08 DIAGNOSIS — Z0000 Encounter for general adult medical examination without abnormal findings: Secondary | ICD-10-CM | POA: Insufficient documentation

## 2015-11-08 DIAGNOSIS — I4891 Unspecified atrial fibrillation: Secondary | ICD-10-CM

## 2015-11-08 DIAGNOSIS — I2510 Atherosclerotic heart disease of native coronary artery without angina pectoris: Secondary | ICD-10-CM

## 2015-11-08 MED ORDER — APIXABAN 5 MG PO TABS: 5.0000 mg | ORAL_TABLET | Freq: Two times a day (BID) | ORAL | Status: DC

## 2015-11-08 NOTE — Assessment & Plan Note (Signed)
Checking labs today, adjust as needed. Colonoscopy up to date, tdap and shingles up to date. Flu done this year. Is not sure if he has gotten the pneumonia series and advised him to check and to get if he has not. Counseled him about his weight and level of physical activity for his health. Given 10 year screening recommendations.

## 2015-11-08 NOTE — Assessment & Plan Note (Signed)
Taking flomax which he has stopped recently for lack of efficacy. Talked to him about other options today but he wishes to further discuss with his urologist.

## 2015-11-08 NOTE — Telephone Encounter (Signed)
Pt called to see if Dr. Sharlet Salina can send in apixaban (ELIQUIS) 5 MG TABS tablet to Blake [name] for 30 days supply (pt is out of this med and he need something until mail order comes in from [facility]). Please check and call him back

## 2015-11-08 NOTE — Assessment & Plan Note (Signed)
Recent x-ray reviewed and no findings that would explain his night sweats and no worrisome changes or lesions. Will remove this problem as it appears to be resolved.

## 2015-11-08 NOTE — Assessment & Plan Note (Signed)
Taking lipitor 80 mg nightly and checking lipid panel and adjust as needed.

## 2015-11-08 NOTE — Assessment & Plan Note (Signed)
Talked to him about the need for exercise to help with his strength and weight. He will work on that.

## 2015-11-08 NOTE — Telephone Encounter (Signed)
Notified pt 30 day sent to [facility],,,,/lmb

## 2015-11-10 ENCOUNTER — Telehealth: Payer: Self-pay | Admitting: Internal Medicine

## 2015-11-10 NOTE — Telephone Encounter (Signed)
Disregard

## 2015-11-10 NOTE — Telephone Encounter (Signed)
Eliquis requires a PA.   The PA form has been filled out and has been faxed back to [employer].  Awaiting response.

## 2015-11-15 ENCOUNTER — Other Ambulatory Visit: Payer: Self-pay

## 2015-11-15 NOTE — Telephone Encounter (Signed)
PA for Eliquis has been approved by insurance. Pt is aware.

## 2015-11-23 ENCOUNTER — Encounter: Payer: Self-pay | Admitting: Internal Medicine

## 2015-11-29 ENCOUNTER — Telehealth: Payer: Self-pay | Admitting: Internal Medicine

## 2015-11-29 NOTE — Telephone Encounter (Signed)
Patient is requesting to transfer for Blake [name] to [place] because he would feel better with a male provider.

## 2015-11-29 NOTE — Telephone Encounter (Signed)
Fine with me

## 2015-11-30 NOTE — Telephone Encounter (Signed)
Ok with me 

## 2015-11-30 NOTE — Telephone Encounter (Signed)
Notified patient.

## 2015-12-01 ENCOUNTER — Ambulatory Visit (INDEPENDENT_AMBULATORY_CARE_PROVIDER_SITE_OTHER): Payer: Medicare Other | Admitting: Internal Medicine

## 2015-12-01 ENCOUNTER — Encounter: Payer: Self-pay | Admitting: Internal Medicine

## 2015-12-01 VITALS — BP 128/70 | HR 91 | Temp 98.4°F | Resp 16 | Wt 201.0 lb

## 2015-12-01 DIAGNOSIS — M48061 Spinal stenosis, lumbar region without neurogenic claudication: Secondary | ICD-10-CM

## 2015-12-01 DIAGNOSIS — M5416 Radiculopathy, lumbar region: Secondary | ICD-10-CM | POA: Diagnosis not present

## 2015-12-01 DIAGNOSIS — M4806 Spinal stenosis, lumbar region: Secondary | ICD-10-CM | POA: Diagnosis not present

## 2015-12-01 MED ORDER — OXYCODONE-ACETAMINOPHEN 5-325 MG PO TABS: 1.0000 | ORAL_TABLET | Freq: Three times a day (TID) | ORAL | Status: DC | PRN

## 2015-12-01 NOTE — Patient Instructions (Signed)
Spinal Stenosis Spinal stenosis is an abnormal narrowing of the canals of your spine (vertebrae). CAUSES  Spinal stenosis is caused by areas of bone pushing into the central canals of your vertebrae. This condition can be present at birth (congenital). It also may be caused by arthritic deterioration of your vertebrae (spinal degeneration).  SYMPTOMS   Pain that is generally worse with activities, particularly standing and walking.  Numbness, tingling, hot or cold sensations, weakness, or weariness in your legs.  Frequent episodes of falling.  A foot-slapping gait that leads to muscle weakness. DIAGNOSIS  Spinal stenosis is diagnosed with the use of magnetic resonance imaging (MRI) or computed tomography (CT). TREATMENT  Initial therapy for spinal stenosis focuses on the management of the pain and other symptoms associated with the condition. These therapies include:  Practicing postural changes to lessen pressure on your nerves.  Exercises to strengthen the core of your body.  Loss of excess body weight.  The use of nonsteroidal anti-inflammatory medicines to reduce swelling and inflammation in your nerves. When therapies to manage pain are not successful, surgery to treat spinal stenosis may be recommended. This surgery involves removing excess bone, which puts pressure on your nerve roots. During this surgery (laminectomy), the posterior boney arch (lamina) and excess bone around the facet joints are removed.   This information is not intended to replace advice given to you by your health care provider. Make sure you discuss any questions you have with your health care provider.   Document Released: 12/29/2003 Document Revised: 10/29/2014 Document Reviewed: 01/16/2013 Elsevier Interactive Patient Education 2016 [employer] Inc.  

## 2015-12-01 NOTE — Progress Notes (Signed)
Subjective:  Patient ID: Blake [name], male    DOB: 1946/09/28  Age: 70 y.o. MRN: [id number]  CC: Follow-up and Back Pain  New to me  HPI Blake [name] presents for complaint of chronic left lower back pain that radiates into his left lower extremity. He was told by his previous physician that he probably has spinal stenosis though x-rays of the lower back were never done. He has been treated with gabapentin and that has provided no relief. He has also taken a few doses of NSAIDs without any relief from his discomfort. The pain is most prominent when he lays down and tries to sleep, the pain keeps him awake. He also has pain throughout the day that limits his physical activities. He describes the pain as an intermittent achy, dull sensation that radiates down his left leg. He denies numbness, weakness, tingling in his lower extremities.  Outpatient Prescriptions Prior to Visit  Medication Sig Dispense Refill  . apixaban (ELIQUIS) 5 MG TABS tablet Take 1 tablet (5 mg total) by mouth 2 (two) times daily. 60 tablet 0  . atorvastatin (LIPITOR) 80 MG tablet Take 1 tablet (80 mg total) by mouth daily. 90 tablet 3  . gabapentin (NEURONTIN) 300 MG capsule Take 1 capsule (300 mg total) by mouth at bedtime. 90 capsule 3  . metoprolol (LOPRESSOR) 50 MG tablet Take 1 tablet (50 mg total) by mouth 2 (two) times daily. 180 tablet 3  . tamsulosin (FLOMAX) 0.4 MG CAPS capsule Take 0.4 mg by mouth daily.      No facility-administered medications prior to visit.    ROS Review of Systems  Constitutional: Negative.  Negative for fever, chills, diaphoresis, appetite change and fatigue.  HENT: Negative.   Eyes: Negative.   Respiratory: Negative.  Negative for cough, choking, chest tightness, shortness of breath and stridor.   Cardiovascular: Negative.  Negative for chest pain and palpitations.  Gastrointestinal: Negative.  Negative for nausea, vomiting, abdominal pain, diarrhea and constipation.    Endocrine: Negative.   Genitourinary: Negative.  Negative for difficulty urinating.  Musculoskeletal: Positive for back pain. Negative for myalgias, joint swelling and neck pain.  Skin: Negative.  Negative for pallor and rash.  Allergic/Immunologic: Negative.   Neurological: Negative.  Negative for dizziness, tremors, weakness, light-headedness and numbness.  Hematological: Negative.  Negative for adenopathy. Does not bruise/bleed easily.  Psychiatric/Behavioral: Negative.     Objective:  BP 128/70 mmHg  Pulse 91  Temp(Src) 98.4 F (36.9 C) (Oral)  Resp 16  Wt 201 lb (91.173 kg)  SpO2 95%  BP Readings from Last 3 Encounters:  12/01/15 128/70  11/07/15 108/62  10/20/15 102/61    Wt Readings from Last 3 Encounters:  12/01/15 201 lb (91.173 kg)  11/07/15 196 lb (88.905 kg)  10/20/15 197 lb 6.4 oz (89.54 kg)    Physical Exam  Constitutional: He is oriented to person, place, and time. No distress.  HENT:  Mouth/Throat: Oropharynx is clear and moist. No oropharyngeal exudate.  Eyes: Conjunctivae are normal. Right eye exhibits no discharge. Left eye exhibits no discharge. No scleral icterus.  Neck: Normal range of motion. Neck supple. No JVD present. No tracheal deviation present. No thyromegaly present.  Cardiovascular: Normal rate, regular rhythm, normal heart sounds and intact distal pulses.  Exam reveals no gallop and no friction rub.   No murmur heard. Pulmonary/Chest: Effort normal and breath sounds normal. No stridor. No respiratory distress. He has no wheezes. He has no rales. He exhibits no  tenderness.  Abdominal: Soft. Bowel sounds are normal. He exhibits no distension and no mass. There is no tenderness. There is no rebound and no guarding.  Musculoskeletal: Normal range of motion. He exhibits no edema or tenderness.       Left hip: Normal.       Left knee: Normal.       Lumbar back: Normal. He exhibits normal range of motion, no tenderness, no bony tenderness, no  swelling, no edema, no deformity, no laceration, no pain, no spasm and normal pulse.       Left upper leg: Normal.  Lymphadenopathy:    He has no cervical adenopathy.  Neurological: He is alert and oriented to person, place, and time. He has normal strength. He displays no atrophy, no tremor and normal reflexes. No cranial nerve deficit or sensory deficit. He exhibits normal muscle tone. He displays a negative Romberg sign. He displays no seizure activity. Coordination and gait normal.  Reflex Scores:      Tricep reflexes are 1+ on the right side and 1+ on the left side.      Bicep reflexes are 1+ on the right side and 1+ on the left side.      Brachioradialis reflexes are 1+ on the right side and 1+ on the left side.      Patellar reflexes are 1+ on the right side and 1+ on the left side.      Achilles reflexes are 1+ on the right side and 1+ on the left side. + SLR in LLE - SLR in RLE  Skin: Skin is warm and dry. No rash noted. He is not diaphoretic. No erythema. No pallor.  Psychiatric: He has a normal mood and affect. His behavior is normal. Judgment and thought content normal.  Vitals reviewed.   Lab Results  Component Value Date   WBC 9.4 11/07/2015   HGB 15.6 11/07/2015   HCT 46.0 11/07/2015   PLT 239.0 11/07/2015   GLUCOSE 75 11/07/2015   CHOL 98 11/07/2015   TRIG 58.0 11/07/2015   HDL 33.90* 11/07/2015   LDLCALC 52 11/07/2015   ALT 23 11/07/2015   AST 22 11/07/2015   NA 140 11/07/2015   K 3.9 11/07/2015   CL 104 11/07/2015   CREATININE 0.79 11/07/2015   BUN 17 11/07/2015   CO2 21 11/07/2015   TSH 0.82 11/07/2015   INR 1.8* 04/13/2011   HGBA1C 5.8 11/07/2015    Dg Chest 2 View  10/18/2015  CLINICAL DATA:  Chest pain with shortness of breath and nausea EXAM: CHEST  2 VIEW COMPARISON:  Chest radiograph December 11, 2013 and chest CT Feb 21, 2014 FINDINGS: There is no edema or consolidation. Heart size and pulmonary vascularity are normal. There is no appreciable  adenopathy. Patient is status post coronary artery bypass grafting. No pneumothorax. There is slight degenerative change in the thoracic spine. There is a benign exostosis arising from the medial proximal right humerus. IMPRESSION: No edema or consolidation. Electronically Signed   By: Lowella Grip III M.D.   On: 10/18/2015 07:15   Ct Abdomen Pelvis W Contrast  10/18/2015  CLINICAL DATA:  LEFT lower quadrant abdominal pain, diarrhea and nausea for last couple days, history hyperlipidemia, coronary artery disease post CABG, LEFT inguinal hernia, atrial fibrillation, gastric ulcer, BPH, former smoker EXAM: CT ABDOMEN AND PELVIS WITH CONTRAST TECHNIQUE: Multidetector CT imaging of the abdomen and pelvis was performed using the standard protocol following bolus administration of intravenous contrast. Sagittal and coronal  MPR images reconstructed from axial data set. CONTRAST:  131mL OMNIPAQUE IOHEXOL 300 MG/ML SOLN IV. No oral contrast. COMPARISON:  09/11/2011 FINDINGS: Lung bases clear. 5 mm nonspecific low-attenuation focus dome of liver image 15, not visualized on prior noncontrast exam. Gas identified within portal venous branches in the lateral segment LEFT lobe with additional portal venous gas seen within portal and superior mesenteric veins as well as in perigastric veins. Stomach moderately distended by fluid and minimal food debris. No definite gastric pneumatosis, wall thickening, or mass identified. Remainder of liver, spleen, pancreas, kidneys, and adrenal glands normal. Scattered atherosclerotic calcifications aorta, iliac arteries and coronary arteries. Normal appendix. Mild sigmoid diverticulosis without evidence of diverticulitis. Remaining bowel loops unremarkable. Normal appearing bladder and ureters. Mild prostatic enlargement gland 5.4 x 4.2 cm image 92. No mass, adenopathy, free air or free fluid. Small RIGHT inguinal hernia containing fat with apparent postsurgical changes of LEFT  inguinal herniorrhaphy. Bones demineralized. IMPRESSION: Gas within portal venous branches within liver, portal and superior mesenteric veins, and within perigastric veins. Stomach appears distended but is otherwise normal in appearance without obvious ulcer, mass/wall thickening or gastric pneumatosis, though the distribution of the gas within perigastric veins suggests a gastric etiology. Portal venous gas can be seen with bowel ischemia, from barotrauma such as following endoscopy, from acute gastric distention, perforated gastric ulcer, COPD, and GI tract obstruction. The lack of additional significant findings suggests this could be related to a benign cause such as acute gastric distention and close clinical followup is recommended to determine the clinical significance of these findings. Sigmoid diverticulosis. Prostatic enlargement. RIGHT inguinal hernia. Findings called to Domenic Moras PA on 10/18/2015 at 0825 hours. Electronically Signed   By: Lavonia Dana M.D.   On: 10/18/2015 08:27    Assessment & Plan:   Corban was seen today for follow-up and back pain.  Diagnoses and all orders for this visit:  Left lumbar radiculitis- I have ordered an MRI of his lumbar spine to confirm the presence of spinal stenosis, he does have a positive straight leg raise left lower extremity but his neurologic exam is otherwise normal, he has pain that interferes with his daily activity so will start oxycodone as needed -     MR Lumbar Spine Wo Contrast; Future -     oxyCODONE-acetaminophen (PERCOCET/ROXICET) 5-325 MG tablet; Take 1 tablet by mouth every 8 (eight) hours as needed for severe pain.   I am having Mr. Salman start on oxyCODONE-acetaminophen. I am also having him maintain his tamsulosin, metoprolol, gabapentin, atorvastatin, and apixaban.  Meds ordered this encounter  Medications  . oxyCODONE-acetaminophen (PERCOCET/ROXICET) 5-325 MG tablet    Sig: Take 1 tablet by mouth every 8 (eight) hours as  needed for severe pain.    Dispense:  90 tablet    Refill:  0     Follow-up: Return in about 6 weeks (around 01/12/2016).  Scarlette Calico, MD

## 2015-12-01 NOTE — Progress Notes (Signed)
Pre visit review using our clinic review tool, if applicable. No additional management support is needed unless otherwise documented below in the visit note. 

## 2015-12-01 NOTE — Progress Notes (Signed)
   Subjective:    Patient ID: Blake [name], male    DOB: 12/22/1945, 70 y.o.   MRN: [id number]  HPI    Review of Systems     Objective:   Physical Exam        Assessment & Plan:

## 2015-12-02 NOTE — Telephone Encounter (Signed)
This encounter was created in error - please disregard.

## 2015-12-07 ENCOUNTER — Ambulatory Visit
Admission: RE | Admit: 2015-12-07 | Discharge: 2015-12-07 | Disposition: A | Discharge status: Home / Self Care | Payer: Medicare Other | Source: Ambulatory Visit | Attending: Internal Medicine | Admitting: Internal Medicine

## 2015-12-07 ENCOUNTER — Encounter: Payer: Self-pay | Admitting: Internal Medicine

## 2015-12-07 DIAGNOSIS — M5416 Radiculopathy, lumbar region: Secondary | ICD-10-CM

## 2015-12-08 ENCOUNTER — Other Ambulatory Visit: Payer: Self-pay | Admitting: Internal Medicine

## 2015-12-08 DIAGNOSIS — M48061 Spinal stenosis, lumbar region without neurogenic claudication: Secondary | ICD-10-CM

## 2015-12-09 ENCOUNTER — Encounter: Payer: Self-pay | Admitting: Internal Medicine

## 2015-12-13 ENCOUNTER — Telehealth: Payer: Self-pay | Admitting: *Deleted

## 2015-12-13 DIAGNOSIS — M48061 Spinal stenosis, lumbar region without neurogenic claudication: Secondary | ICD-10-CM

## 2015-12-13 MED ORDER — OXYCODONE-ACETAMINOPHEN 10-325 MG PO TABS: 1.0000 | ORAL_TABLET | Freq: Three times a day (TID) | ORAL | Status: DC | PRN

## 2015-12-13 NOTE — Telephone Encounter (Signed)
Notified pt rx ready for pick-up.../lmb 

## 2015-12-13 NOTE — Telephone Encounter (Signed)
Left msg on triage stating MD is referring him to see a specialist "Pain clinic" was told it will take up to 3 months before he may get an appt. He states he is in severe pain wanting to see can md increase his Oxycodone/apap...Blake [name]

## 2015-12-13 NOTE — Telephone Encounter (Signed)
Yes, rx written

## 2015-12-21 ENCOUNTER — Encounter: Payer: Self-pay | Admitting: Internal Medicine

## 2016-01-06 ENCOUNTER — Telehealth: Payer: Self-pay | Admitting: Cardiology

## 2016-01-06 NOTE — Telephone Encounter (Signed)
Will fax this note to the number provided. 

## 2016-01-06 NOTE — Telephone Encounter (Signed)
Request for surgical clearance:  1. What type of surgery is being performed? Epideral Steroid Injection   2. When is this surgery scheduled? 02-01-16   3. Are there any medications that need to be held prior to surgery and how long?Dioes he need to stop his Eliquis?4.Name of physician performing surgery?Dr Balinda Quails  4. What is your office phone and fax number? [phone] and [phone] Att: [place] 5.

## 2016-01-06 NOTE — Telephone Encounter (Signed)
Hold apixaban 3 days prior to procedure and resume day after Blake [name]  

## 2016-01-06 NOTE — Telephone Encounter (Signed)
Procedure clearance request. Instructions requested for Eliquis.  Last seen by Blake [name] 11/16.

## 2016-04-19 DIAGNOSIS — N401 Enlarged prostate with lower urinary tract symptoms: Secondary | ICD-10-CM | POA: Diagnosis not present

## 2016-04-19 LAB — PSA: PSA: 6.87

## 2016-04-30 DIAGNOSIS — R350 Frequency of micturition: Secondary | ICD-10-CM | POA: Diagnosis not present

## 2016-04-30 DIAGNOSIS — N3281 Overactive bladder: Secondary | ICD-10-CM | POA: Diagnosis not present

## 2016-04-30 DIAGNOSIS — R972 Elevated prostate specific antigen [PSA]: Secondary | ICD-10-CM | POA: Diagnosis not present

## 2016-04-30 DIAGNOSIS — N401 Enlarged prostate with lower urinary tract symptoms: Secondary | ICD-10-CM | POA: Diagnosis not present

## 2016-05-16 DIAGNOSIS — Z0100 Encounter for examination of eyes and vision without abnormal findings: Secondary | ICD-10-CM | POA: Diagnosis not present

## 2016-05-16 DIAGNOSIS — H2513 Age-related nuclear cataract, bilateral: Secondary | ICD-10-CM | POA: Diagnosis not present

## 2016-05-31 ENCOUNTER — Other Ambulatory Visit: Payer: Self-pay | Admitting: Internal Medicine

## 2016-05-31 DIAGNOSIS — I480 Paroxysmal atrial fibrillation: Secondary | ICD-10-CM

## 2016-05-31 DIAGNOSIS — K259 Gastric ulcer, unspecified as acute or chronic, without hemorrhage or perforation: Secondary | ICD-10-CM

## 2016-05-31 DIAGNOSIS — I4891 Unspecified atrial fibrillation: Secondary | ICD-10-CM

## 2016-05-31 DIAGNOSIS — I2510 Atherosclerotic heart disease of native coronary artery without angina pectoris: Secondary | ICD-10-CM

## 2016-06-28 ENCOUNTER — Other Ambulatory Visit: Payer: Self-pay | Admitting: Internal Medicine

## 2016-06-28 ENCOUNTER — Encounter: Payer: Self-pay | Admitting: Internal Medicine

## 2016-06-28 ENCOUNTER — Other Ambulatory Visit (INDEPENDENT_AMBULATORY_CARE_PROVIDER_SITE_OTHER): Payer: Medicare Other

## 2016-06-28 ENCOUNTER — Ambulatory Visit (INDEPENDENT_AMBULATORY_CARE_PROVIDER_SITE_OTHER)
Admission: RE | Admit: 2016-06-28 | Discharge: 2016-06-28 | Disposition: A | Discharge status: Home / Self Care | Payer: Medicare Other | Source: Ambulatory Visit | Attending: Internal Medicine | Admitting: Internal Medicine

## 2016-06-28 ENCOUNTER — Ambulatory Visit (INDEPENDENT_AMBULATORY_CARE_PROVIDER_SITE_OTHER): Payer: Medicare Other | Admitting: Internal Medicine

## 2016-06-28 DIAGNOSIS — I2510 Atherosclerotic heart disease of native coronary artery without angina pectoris: Secondary | ICD-10-CM

## 2016-06-28 DIAGNOSIS — R109 Unspecified abdominal pain: Secondary | ICD-10-CM | POA: Insufficient documentation

## 2016-06-28 DIAGNOSIS — I4891 Unspecified atrial fibrillation: Secondary | ICD-10-CM

## 2016-06-28 DIAGNOSIS — I480 Paroxysmal atrial fibrillation: Secondary | ICD-10-CM

## 2016-06-28 DIAGNOSIS — R1010 Upper abdominal pain, unspecified: Secondary | ICD-10-CM

## 2016-06-28 DIAGNOSIS — R1033 Periumbilical pain: Secondary | ICD-10-CM | POA: Diagnosis not present

## 2016-06-28 DIAGNOSIS — N400 Enlarged prostate without lower urinary tract symptoms: Secondary | ICD-10-CM | POA: Diagnosis not present

## 2016-06-28 DIAGNOSIS — Z23 Encounter for immunization: Secondary | ICD-10-CM

## 2016-06-28 DIAGNOSIS — K259 Gastric ulcer, unspecified as acute or chronic, without hemorrhage or perforation: Secondary | ICD-10-CM

## 2016-06-28 LAB — CBC WITH DIFFERENTIAL/PLATELET
BASOS PCT: 0.4 % (ref 0.0–3.0)
Basophils Absolute: 0 10*3/uL (ref 0.0–0.1)
EOS ABS: 0.1 10*3/uL (ref 0.0–0.7)
EOS PCT: 1.1 % (ref 0.0–5.0)
HEMATOCRIT: 43.2 % (ref 39.0–52.0)
HEMOGLOBIN: 14.9 g/dL (ref 13.0–17.0)
LYMPHS PCT: 19.8 % (ref 12.0–46.0)
Lymphs Abs: 2.2 10*3/uL (ref 0.7–4.0)
MCHC: 34.4 g/dL (ref 30.0–36.0)
MCV: 96.3 fl (ref 78.0–100.0)
MONOS PCT: 10.2 % (ref 3.0–12.0)
Monocytes Absolute: 1.1 10*3/uL — ABNORMAL HIGH (ref 0.1–1.0)
Neutro Abs: 7.6 10*3/uL (ref 1.4–7.7)
Neutrophils Relative %: 68.5 % (ref 43.0–77.0)
Platelets: 205 10*3/uL (ref 150.0–400.0)
RBC: 4.49 Mil/uL (ref 4.22–5.81)
RDW: 13.2 % (ref 11.5–15.5)
WBC: 11 10*3/uL — AB (ref 4.0–10.5)

## 2016-06-28 LAB — URINALYSIS, ROUTINE W REFLEX MICROSCOPIC
BILIRUBIN URINE: NEGATIVE
KETONES UR: NEGATIVE
Leukocytes, UA: NEGATIVE
Nitrite: NEGATIVE
Total Protein, Urine: NEGATIVE
UROBILINOGEN UA: 0.2 (ref 0.0–1.0)
Urine Glucose: NEGATIVE
pH: 5.5 (ref 5.0–8.0)

## 2016-06-28 LAB — BASIC METABOLIC PANEL
BUN: 11 mg/dL (ref 6–23)
CALCIUM: 9.1 mg/dL (ref 8.4–10.5)
CO2: 29 mEq/L (ref 19–32)
CREATININE: 0.76 mg/dL (ref 0.40–1.50)
Chloride: 105 mEq/L (ref 96–112)
GFR: 107.11 mL/min (ref >60.00)
Glucose, Bld: 78 mg/dL (ref 70–99)
Potassium: 4 mEq/L (ref 3.5–5.1)
Sodium: 138 mEq/L (ref 135–145)

## 2016-06-28 LAB — HEPATIC FUNCTION PANEL
ALT: 12 U/L (ref 0–53)
AST: 12 U/L (ref 0–37)
Albumin: 4.2 g/dL (ref 3.5–5.2)
Alkaline Phosphatase: 79 U/L (ref 39–117)
BILIRUBIN TOTAL: 1.6 mg/dL — AB (ref 0.2–1.2)
Bilirubin, Direct: 0.4 mg/dL — ABNORMAL HIGH (ref 0.0–0.3)
Total Protein: 7.2 g/dL (ref 6.0–8.3)

## 2016-06-28 LAB — LIPASE: LIPASE: 20 U/L (ref 11.0–59.0)

## 2016-06-28 MED ORDER — PANTOPRAZOLE SODIUM 40 MG PO TBEC: 40.0000 mg | DELAYED_RELEASE_TABLET | Freq: Every day | ORAL | 3 refills | Status: DC

## 2016-06-28 MED ORDER — POLYETHYLENE GLYCOL 3350 17 GM/SCOOP PO POWD: 17.0000 g | Freq: Two times a day (BID) | ORAL | 1 refills | Status: DC | PRN

## 2016-06-28 NOTE — Progress Notes (Signed)
Pre visit review using our clinic review tool, if applicable. No additional management support is needed unless otherwise documented below in the visit note. 

## 2016-06-28 NOTE — Patient Instructions (Addendum)
Please take all new medication as prescribed - the protonix, as well as the miralax as directed  Please continue all other medications as before, and refills have been done if requested.  Please have the pharmacy call with any other refills you may need.  Please continue your efforts at being more active, low cholesterol diet, and weight control.  You will be contacted regarding the referral for: Gastroenterology  Please keep your appointments with your specialists as you may have planned  Please go to the XRAY Department in the Basement (go straight as you get off the elevator) for the x-ray testing  Please go to the LAB in the Basement (turn left off the elevator) for the tests to be done today  You will be contacted by phone if any changes need to be made immediately.  Otherwise, you will receive a letter about your results with an explanation, but please check with MyChart first.  Please remember to sign up for MyChart if you have not done so, as this will be important to you in the future with finding out test results, communicating by private email, and scheduling acute appointments online when needed.

## 2016-06-28 NOTE — Progress Notes (Signed)
Subjective:    Patient ID: Blake [name], male    DOB: 1946-10-04, 70 y.o.   MRN: [id number]  HPI  Here with intermittent abd pain for several months, mild to mod, attributed before to overeating and poor diet, but lately has tried to do better with diet, but still especially in last 10 day still with bloating and and upper abd pain, dull, intermittent, seems some worse in the AM before eating, better later in the day.  No n/v, fever  Denies worsening reflux, lower abd pain, dysphagia, or blood.  Has also had some loose stools or diarrhea a couple of BM every about 2-3 wks irregularly.  Overall upper pain somewhat improved with BM but not gone.   Pt denies fever, night sweats, loss of appetite, or other constitutional symptoms  Has lost 10 lbs overall trying to work on better diet, maybe 2-3 lbs of that in the past 2 wks.   Wt Readings from Last 3 Encounters:  06/28/16 190 lb (86.2 kg)  12/01/15 201 lb (91.2 kg)  11/07/15 196 lb (88.9 kg)  last endoscopy - none recent. Last colonoscopy jan 2014 with polyp x 2 , mod diverticulosis, and f/u 5 yrs with Dr Kathrine Cords.   Has hx of PUD/duodenal ulcer after surgury 6 yrs ago. No overt bleeding on eliquis  Pt denies chest pain, increased sob or doe, wheezing, orthopnea, PND, increased LE swelling, palpitations, dizziness or syncope. Denies urinary symptoms such as dysuria, frequency, urgency, flank pain, hematuria or n/v, fever, chills. Past Medical History:  Diagnosis Date  . Angina    none since CABG  . Atrial fibrillation ([place])   . BINGE EATING DISORDER 05/25/2009  . Blood transfusion    6/12  following GI Bleed with coumadin therapy  . BPH (benign prostatic hyperplasia)   . CAD (coronary artery disease)   . Chest pain 03/05/2011  . Chronic kidney disease    kidney stones years ago  . Dysrhythmia    atrial fib post Cabg/ 5 days Coumadin only  . Elevated PSA   . Gastric ulcer   . History of nephrolithiasis   . Hyperlipidemia   . HYPERLIPIDEMIA  05/25/2009  . Left inguinal hernia   . Obesity   . Symptoms involving urinary system   . Urgency of urination    Past Surgical History:  Procedure Laterality Date  . CARDIAC CATHETERIZATION     6/12  . CORONARY ARTERY BYPASS GRAFT  03/27/2011   x3, Dr Tharon Aquas Trigt    . ESOPHAGOGASTRODUODENOSCOPY ENDOSCOPY  04/12/11   cauterization of bleeding ulcer  and artery in duodenum and stomach   . INGUINAL HERNIA REPAIR  11/06/2011   Procedure: HERNIA REPAIR INGUINAL ADULT;  Surgeon: Earnstine Regal, MD;  Location: [facility];  Service: General;  Laterality: N/A;  Repair left inguinal hernia with mesh  . POLYPECTOMY     x 2  . WISDOM TOOTH EXTRACTION      reports that he quit smoking about 28 years ago. He has a 25.00 pack-year smoking history. He has never used smokeless tobacco. He reports that he drinks about 0.6 oz of alcohol per week . He reports that he does not use drugs. family history includes Atrial fibrillation in his brother; Cancer in his father; Coronary artery disease in his father; Diabetes in his sister; Heart attack (age of onset: 4) in his sister; Heart attack (age of onset: 89) in his father; Ovarian cancer in his sister. No Known Allergies Current  Outpatient Prescriptions on File Prior to Visit  Medication Sig Dispense Refill  . atorvastatin (LIPITOR) 80 MG tablet Take 1 tablet (80 mg total) by mouth daily. 90 tablet 3  . gabapentin (NEURONTIN) 300 MG capsule Take 1 capsule (300 mg total) by mouth at bedtime. 90 capsule 3  . metoprolol (LOPRESSOR) 50 MG tablet Take 1 tablet (50 mg total) by mouth 2 (two) times daily. 180 tablet 3  . oxyCODONE-acetaminophen (PERCOCET) 10-325 MG tablet Take 1 tablet by mouth every 8 (eight) hours as needed for pain. 90 tablet 0  . tamsulosin (FLOMAX) 0.4 MG CAPS capsule Take 0.4 mg by mouth daily.      No current facility-administered medications on file prior to visit.    Review of Systems  Constitutional: Negative for unusual diaphoresis or  night sweats HENT: Negative for ear swelling or discharge Eyes: Negative for worsening visual haziness  Respiratory: Negative for choking and stridor.   Gastrointestinal: Negative for distension or worsening eructation Genitourinary: Negative for retention or change in urine volume.  Musculoskeletal: Negative for other MSK pain or swelling Skin: Negative for color change and worsening wound Neurological: Negative for tremors and numbness other than noted  Psychiatric/Behavioral: Negative for decreased concentration or agitation other than above       Objective:   Physical Exam BP 118/78   Pulse 85   Temp 98.6 F (37 C) (Oral)   Resp 20   Wt 190 lb (86.2 kg)   SpO2 97%   BMI 30.67 kg/m  VS noted,  Constitutional: Pt appears in no apparent distress HENT: Head: NCAT.  Right Ear: External ear normal.  Left Ear: External ear normal.  Eyes: . Pupils are equal, round, and reactive to light. Conjunctivae and EOM are normal Neck: Normal range of motion. Neck supple.  Cardiovascular: Normal rate and regular rhythm.   Pulmonary/Chest: Effort normal and breath sounds without rales or wheezing.  Abd:  Soft, NT, ND, + BS - benign Neurological: Pt is alert. Not confused , motor grossly intact Skin: Skin is warm. No rash, no LE edema Psychiatric: Pt behavior is normal. No agitation.     Assessment & Plan:

## 2016-06-28 NOTE — Assessment & Plan Note (Signed)
Mid and upper abd, etiology unclear, has hx of PUD by EGD several yrs ago, suspect possible constipation or gastritis, for protonix and miralax trials, lab eval including xray today, and refer GI

## 2016-06-29 ENCOUNTER — Encounter: Payer: Self-pay | Admitting: Internal Medicine

## 2016-06-29 ENCOUNTER — Telehealth: Payer: Self-pay | Admitting: *Deleted

## 2016-06-29 LAB — H. PYLORI ANTIBODY, IGG: H PYLORI IGG: NEGATIVE

## 2016-06-29 MED ORDER — POLYETHYLENE GLYCOL 3350 17 GM/SCOOP PO POWD: 17.0000 g | Freq: Two times a day (BID) | ORAL | 1 refills | Status: DC | PRN

## 2016-06-29 NOTE — Telephone Encounter (Signed)
Rec'd fax stating needing the specific quantity to dispense on pt miralax powder that was sent yesterday. Updated script sent to [facility]...[name]

## 2016-06-30 ENCOUNTER — Encounter: Payer: Self-pay | Admitting: Internal Medicine

## 2016-07-01 NOTE — Assessment & Plan Note (Signed)
stable overall by history and exam, and pt to continue medical treatment as before,  to f/u any worsening symptoms or concerns 

## 2016-07-02 NOTE — Telephone Encounter (Signed)
For some reason the computer will not let me respond by mychart  Corinne to let pt know, ok to cont the protonix and the miralax as he has  The GI consult request is still active, but may take some time  If he improves adequately, ask pt to let us know, and the GI consult can be cancelled, thanks

## 2016-07-03 ENCOUNTER — Other Ambulatory Visit: Payer: Self-pay | Admitting: *Deleted

## 2016-07-03 DIAGNOSIS — K259 Gastric ulcer, unspecified as acute or chronic, without hemorrhage or perforation: Secondary | ICD-10-CM

## 2016-07-03 DIAGNOSIS — I2510 Atherosclerotic heart disease of native coronary artery without angina pectoris: Secondary | ICD-10-CM

## 2016-07-03 DIAGNOSIS — I4891 Unspecified atrial fibrillation: Secondary | ICD-10-CM

## 2016-07-03 DIAGNOSIS — R002 Palpitations: Secondary | ICD-10-CM

## 2016-07-03 DIAGNOSIS — I480 Paroxysmal atrial fibrillation: Secondary | ICD-10-CM

## 2016-07-03 MED ORDER — ATORVASTATIN CALCIUM 80 MG PO TABS: 80.0000 mg | ORAL_TABLET | Freq: Every day | ORAL | 0 refills | Status: DC

## 2016-07-03 MED ORDER — METOPROLOL TARTRATE 50 MG PO TABS: 50.0000 mg | ORAL_TABLET | Freq: Two times a day (BID) | ORAL | 0 refills | Status: DC

## 2016-07-03 NOTE — Telephone Encounter (Signed)
Rec'd call pt states he has ran out of his lipitor & metoprolol normally get through mail order, but will be going out of town and needing a rx sent to local pharmacy. Verified pharmacy inform will send to [facility]...[name]

## 2016-07-24 ENCOUNTER — Other Ambulatory Visit: Payer: Self-pay | Admitting: *Deleted

## 2016-07-24 ENCOUNTER — Other Ambulatory Visit: Payer: Self-pay | Admitting: Internal Medicine

## 2016-07-24 DIAGNOSIS — K259 Gastric ulcer, unspecified as acute or chronic, without hemorrhage or perforation: Secondary | ICD-10-CM

## 2016-07-24 DIAGNOSIS — I480 Paroxysmal atrial fibrillation: Secondary | ICD-10-CM

## 2016-07-24 DIAGNOSIS — I4891 Unspecified atrial fibrillation: Secondary | ICD-10-CM

## 2016-07-24 DIAGNOSIS — I2510 Atherosclerotic heart disease of native coronary artery without angina pectoris: Secondary | ICD-10-CM

## 2016-07-24 DIAGNOSIS — R002 Palpitations: Secondary | ICD-10-CM

## 2016-07-24 NOTE — Telephone Encounter (Signed)
Patient called and requested that these be refilled until his appointment. He would like a call at [phone] when these have been sent as he is out of medication.

## 2016-07-25 MED ORDER — METOPROLOL TARTRATE 50 MG PO TABS: 50.0000 mg | ORAL_TABLET | Freq: Two times a day (BID) | ORAL | 0 refills | Status: DC

## 2016-07-25 MED ORDER — ATORVASTATIN CALCIUM 80 MG PO TABS: 80.0000 mg | ORAL_TABLET | Freq: Every day | ORAL | 0 refills | Status: DC

## 2016-07-25 MED ORDER — APIXABAN 5 MG PO TABS: 5.0000 mg | ORAL_TABLET | Freq: Two times a day (BID) | ORAL | 0 refills | Status: DC

## 2016-08-06 ENCOUNTER — Other Ambulatory Visit: Payer: Self-pay | Admitting: *Deleted

## 2016-08-06 DIAGNOSIS — I480 Paroxysmal atrial fibrillation: Secondary | ICD-10-CM

## 2016-08-06 DIAGNOSIS — I2510 Atherosclerotic heart disease of native coronary artery without angina pectoris: Secondary | ICD-10-CM

## 2016-08-06 DIAGNOSIS — I4891 Unspecified atrial fibrillation: Secondary | ICD-10-CM

## 2016-08-06 MED ORDER — APIXABAN 5 MG PO TABS: 5.0000 mg | ORAL_TABLET | Freq: Two times a day (BID) | ORAL | 3 refills | Status: DC

## 2016-08-21 ENCOUNTER — Encounter: Payer: Self-pay | Admitting: Cardiology

## 2016-08-30 NOTE — Progress Notes (Signed)
HPI:  Followup coronary artery disease and atrial fibrillation. Abdominal CT in 2012 showed no aneurysm. Patient had coronary artery bypassing graft in 2012 with a LIMA to the LAD, saphenous vein graft to the first diagonal and saphenous vein graft to the acute marginal. Patient did have postoperative atrial fibrillation which resolved. Note preoperative LV function was normal. Monitor in October of 2014 showed paroxysmal atrial fibrillation. Nuclear study in October 2014 showed an ejection fraction of 63% and normal perfusion. Echocardiogram in December of 2014 showed normal LV function, mild left ventricular hypertrophy, moderate to severe left atrial enlargement. Last seen 1/15. Since last seen, the patient denies any dyspnea on exertion, orthopnea, PND, pedal edema, palpitations, syncope or chest pain.   Current Outpatient Prescriptions  Medication Sig Dispense Refill  . apixaban (ELIQUIS) 5 MG TABS tablet Take 1 tablet (5 mg total) by mouth 2 (two) times daily. 180 tablet 3  . atorvastatin (LIPITOR) 80 MG tablet TAKE ONE TABLET BY MOUTH DAILY 30 tablet 5  . metoprolol (LOPRESSOR) 50 MG tablet Take 1 tablet (50 mg total) by mouth 2 (two) times daily. Enough until he receive mail order 60 tablet 0  . oxyCODONE-acetaminophen (PERCOCET) 10-325 MG tablet Take 1 tablet by mouth every 8 (eight) hours as needed for pain. 90 tablet 0  . pantoprazole (PROTONIX) 40 MG tablet Take 1 tablet (40 mg total) by mouth daily. 90 tablet 3  . polyethylene glycol powder (GLYCOLAX/MIRALAX) powder Take 17 g by mouth 2 (two) times daily as needed. 3400 g 1   No current facility-administered medications for this visit.     No Known Allergies   Past Medical History:  Diagnosis Date  . Angina    none since CABG  . Atrial fibrillation ([place])   . BINGE EATING DISORDER 05/25/2009  . Blood transfusion    6/12  following GI Bleed with coumadin therapy  . BPH (benign prostatic hyperplasia)   . CAD (coronary  artery disease)   . Chest pain 03/05/2011  . Chronic kidney disease    kidney stones years ago  . Dysrhythmia    atrial fib post Cabg/ 5 days Coumadin only  . Elevated PSA   . Gastric ulcer   . History of nephrolithiasis   . Hyperlipidemia   . HYPERLIPIDEMIA 05/25/2009  . Left inguinal hernia   . Obesity   . Symptoms involving urinary system   . Urgency of urination     Past Surgical History:  Procedure Laterality Date  . CARDIAC CATHETERIZATION     6/12  . CORONARY ARTERY BYPASS GRAFT  03/27/2011   x3, Dr Tharon Aquas Trigt    . ESOPHAGOGASTRODUODENOSCOPY ENDOSCOPY  04/12/11   cauterization of bleeding ulcer  and artery in duodenum and stomach   . INGUINAL HERNIA REPAIR  11/06/2011   Procedure: HERNIA REPAIR INGUINAL ADULT;  Surgeon: Earnstine Regal, MD;  Location: [facility];  Service: General;  Laterality: N/A;  Repair left inguinal hernia with mesh  . POLYPECTOMY     x 2  . WISDOM TOOTH EXTRACTION      Social History   Social History  . Marital status: Married    Spouse name: N/A  . Number of children: 4  . Years of education: N/A   Occupational History  . retired     Social History Main Topics  . Smoking status: Former Smoker    Packs/day: 1.00    Years: 25.00    Quit date: 03/04/1988  . Smokeless  tobacco: Never Used  . Alcohol use 0.6 oz/week    1 Cans of beer per week     Comment: occ  . Drug use: No  . Sexual activity: Not on file   Other Topics Concern  . Not on file   Social History Narrative   Regular exercise- yes     Family History  Problem Relation Age of Onset  . Coronary artery disease Father   . Heart attack Father 53  . Cancer Father   . Lymphoma    . Diabetes Sister   . Heart attack Sister 34  . Ovarian cancer Sister   . Atrial fibrillation Brother   . Colon cancer Neg Hx   . Stomach cancer Neg Hx     ROS: Back and leg pain but  no fevers or chills, productive cough, hemoptysis, dysphasia, odynophagia, melena, hematochezia, dysuria,  hematuria, rash, seizure activity, orthopnea, PND, pedal edema, claudication. Remaining systems are negative.  Physical Exam:   Blood pressure 98/60, pulse 75, height 5\' 6"  (1.676 m), weight 192 lb 9.6 oz (87.4 kg).  General:  Well developed/well nourished in NAD Skin warm/dry Patient not depressed No peripheral clubbing Back-normal HEENT-normal/normal eyelids Neck supple/normal carotid upstroke bilaterally; no bruits; no JVD; no thyromegaly chest - CTA/ normal expansion CV - irregular/normal S1 and S2; no murmurs, rubs or gallops;  PMI nondisplaced Abdomen -NT/ND, no HSM, no mass, + bowel sounds, no bruit 2+ femoral pulses, no bruits Ext-no edema, chords, 2+ DP Neuro-grossly nonfocal  ECG -atrial fibrillation at a rate of 75. No ST changes.  A/P  1 paroxysmal atrial fibrillation-patient remains in atrial fibrillation. Plan rate control and anticoagulation. Continue apixaban. Laboratories recently checked September 2017.  2 coronary artery disease-continue statin. No aspirin given need for anticoagulation.  3 hyperlipidemia-continue statin.  4 Hypertension-BP controlled; continue present meds   Kirk Ruths, MD

## 2016-09-04 ENCOUNTER — Ambulatory Visit (INDEPENDENT_AMBULATORY_CARE_PROVIDER_SITE_OTHER): Payer: Medicare Other | Admitting: Cardiology

## 2016-09-04 ENCOUNTER — Encounter: Payer: Self-pay | Admitting: Cardiology

## 2016-09-04 VITALS — BP 98/60 | HR 75 | Ht 66.0 in | Wt 192.6 lb

## 2016-09-04 DIAGNOSIS — I10 Essential (primary) hypertension: Secondary | ICD-10-CM | POA: Diagnosis not present

## 2016-09-04 DIAGNOSIS — I480 Paroxysmal atrial fibrillation: Secondary | ICD-10-CM | POA: Diagnosis not present

## 2016-09-04 DIAGNOSIS — I4891 Unspecified atrial fibrillation: Secondary | ICD-10-CM

## 2016-09-04 DIAGNOSIS — E7800 Pure hypercholesterolemia, unspecified: Secondary | ICD-10-CM

## 2016-09-04 DIAGNOSIS — I2510 Atherosclerotic heart disease of native coronary artery without angina pectoris: Secondary | ICD-10-CM

## 2016-09-04 MED ORDER — ATORVASTATIN CALCIUM 80 MG PO TABS: 80.0000 mg | ORAL_TABLET | Freq: Every day | ORAL | 3 refills | Status: DC

## 2016-09-04 MED ORDER — APIXABAN 5 MG PO TABS: 5.0000 mg | ORAL_TABLET | Freq: Two times a day (BID) | ORAL | 3 refills | Status: DC

## 2016-09-04 MED ORDER — METOPROLOL TARTRATE 50 MG PO TABS: 50.0000 mg | ORAL_TABLET | Freq: Two times a day (BID) | ORAL | 3 refills | Status: DC

## 2016-09-04 NOTE — Patient Instructions (Signed)
Medication Instructions:  Continue current medications  Labwork: None Ordered  Testing/Procedures: None Ordered  Follow-Up: Your physician wants you to follow-up in: 1 Year. You will receive a reminder letter in the mail two months in advance. If you don't receive a letter, please call our office to schedule the follow-up appointment.   Any Other Special Instructions Will Be Listed Below (If Applicable).           Happy Thanksgiving  If you need a refill on your cardiac medications before your next appointment, please call your pharmacy.

## 2016-09-12 ENCOUNTER — Ambulatory Visit: Payer: Medicare Other | Admitting: Internal Medicine

## 2016-10-09 ENCOUNTER — Ambulatory Visit (INDEPENDENT_AMBULATORY_CARE_PROVIDER_SITE_OTHER): Payer: Medicare Other | Admitting: Internal Medicine

## 2016-10-09 ENCOUNTER — Encounter: Payer: Self-pay | Admitting: Internal Medicine

## 2016-10-09 ENCOUNTER — Ambulatory Visit (INDEPENDENT_AMBULATORY_CARE_PROVIDER_SITE_OTHER)
Admission: RE | Admit: 2016-10-09 | Discharge: 2016-10-09 | Disposition: A | Discharge status: Home / Self Care | Payer: Medicare Other | Source: Ambulatory Visit | Attending: Internal Medicine | Admitting: Internal Medicine

## 2016-10-09 VITALS — BP 112/62 | HR 85 | Temp 97.9°F | Resp 16 | Ht 66.0 in | Wt 202.0 lb

## 2016-10-09 DIAGNOSIS — M19011 Primary osteoarthritis, right shoulder: Secondary | ICD-10-CM | POA: Diagnosis not present

## 2016-10-09 DIAGNOSIS — M25511 Pain in right shoulder: Secondary | ICD-10-CM

## 2016-10-09 MED ORDER — HYDROCODONE-ACETAMINOPHEN 5-325 MG PO TABS: 1.0000 | ORAL_TABLET | Freq: Four times a day (QID) | ORAL | 0 refills | Status: DC | PRN

## 2016-10-09 NOTE — Patient Instructions (Signed)
Shoulder Pain Many things can cause shoulder pain, including:  An injury to the area.  Overuse of the shoulder.  Arthritis. The source of the pain can be:  Inflammation.  An injury to the shoulder joint.  An injury to a tendon, ligament, or bone. Follow these instructions at home: Take these actions to help with your pain:  Squeeze a soft ball or a foam pad as much as possible. This helps to keep the shoulder from swelling. It also helps to strengthen the arm.  Take over-the-counter and prescription medicines only as told by your health care provider.  If directed, apply ice to the area:  Put ice in a plastic bag.  Place a towel between your skin and the bag.  Leave the ice on for 20 minutes, 2-3 times per day. Stop applying ice if it does not help with the pain.  If you were given a shoulder sling or immobilizer:  Wear it as told.  Remove it to shower or bathe.  Move your arm as little as possible, but keep your hand moving to prevent swelling. Contact a health care provider if:  Your pain gets worse.  Your pain is not relieved with medicines.  New pain develops in your arm, hand, or fingers. Get help right away if:  Your arm, hand, or fingers:  Tingle.  Become numb.  Become swollen.  Become painful.  Turn white or blue. This information is not intended to replace advice given to you by your health care provider. Make sure you discuss any questions you have with your health care provider. Document Released: 07/18/2005 Document Revised: 06/03/2016 Document Reviewed: 01/31/2015 Elsevier Interactive Patient Education  2017 Elsevier Inc.  

## 2016-10-09 NOTE — Progress Notes (Signed)
Pre visit review using our clinic review tool, if applicable. No additional management support is needed unless otherwise documented below in the visit note. 
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
## 2016-10-10 ENCOUNTER — Encounter: Payer: Self-pay | Admitting: Internal Medicine

## 2016-10-11 MED ORDER — METHYLPREDNISOLONE ACETATE 40 MG/ML IJ SUSP
40.0000 mg | Freq: Once | INTRAMUSCULAR | Status: AC
  Administered 2016-10-09: 40 mg via INTRA_ARTICULAR

## 2016-10-25 DIAGNOSIS — R972 Elevated prostate specific antigen [PSA]: Secondary | ICD-10-CM | POA: Diagnosis not present

## 2016-11-05 ENCOUNTER — Ambulatory Visit (INDEPENDENT_AMBULATORY_CARE_PROVIDER_SITE_OTHER): Payer: Medicare Other | Admitting: Orthopaedic Surgery

## 2016-11-05 VITALS — Ht 66.0 in | Wt 202.0 lb

## 2016-11-05 DIAGNOSIS — M25511 Pain in right shoulder: Secondary | ICD-10-CM | POA: Diagnosis not present

## 2016-11-05 DIAGNOSIS — G8929 Other chronic pain: Secondary | ICD-10-CM

## 2016-11-05 DIAGNOSIS — M19011 Primary osteoarthritis, right shoulder: Secondary | ICD-10-CM

## 2016-11-05 NOTE — Progress Notes (Signed)
Office Visit Note   Patient: Blake [name]           Date of Birth: [birth year]           MRN: [id number] Visit Date: 11/05/2016              Requested by: Janith Lima, MD [address] [place], [place] [address] PCP: Scarlette Calico, MD   Assessment & Plan: Visit Diagnoses: Right shoulder pain with impingement. Diagnostic possibilities include chronic rotator cuff tear with a without glenohumeral osteoarthritis  Plan: MRI scan right shoulder .follow-up after scan   Follow-Up Instructions: No Follow-up on file.   Orders:  No orders of the defined types were placed in this encounter.  No orders of the defined types were placed in this encounter.     Procedures: No procedures performed   Clinical Data: No additional findings.   Subjective: No chief complaint on file.       Pt Blake [name] presents for chronic and now worsening right shoulder pain with decreased ROM. The pain interferes with his sleep so he takes norco for the pain. He denies any recent trauma or injury referred to [place] by Dr. Scarlette Calico, PCP. Xrays were obtained in his office 10/10/17.  Blake [name] relates fracturing his right shoulder as a teenager. He does remember any limitations of activities until the onset of this present pain. A month ago he had a cortisone injection which alleviated his pain up until several days ago. He's had difficulty sleeping on his right shoulder as well as placing it in an overhead position. He is right-hand-dominant  Review of Systems   Objective: Vital Signs: There were no vitals taken for this visit.  Physical Exam  Ortho Exam right shoulder exam with positive impingement. Full overhead motion slowly with a painful arc. Positive empty can testing. Some crepitation. Good grip and release. Motor strength appears to be normal with patient able to touch the lower part of his back with his right hand. Slight loss of external rotation compared to the  left shoulder.  Specialty Comments:  No specialty comments available.  Imaging: Films of his right shoulder were performed by his primary care physician. These were reviewed on the PACS system. I reviewed them with evidence of degenerative changes at the acromioclavicular joint. There is an 8 mm space between the humeral head and the acromion. Some early irregularity about the humeral head possibly consistent with osteoarthritis. No ectopic calcification.   PMFS History: Patient Active Problem List   Diagnosis Date Noted  . Pain in joint of right shoulder 10/09/2016  . Primary osteoarthritis of right shoulder 10/09/2016  . Spinal stenosis of lumbar region at multiple levels 12/01/2015  . Routine general medical examination at a health care facility 11/08/2015  . Obesity (BMI 30-39.9) 10/28/2012  . BPH (benign prostatic hyperplasia) 06/08/2011  . Gastric ulcer 06/08/2011  . Coronary artery disease 05/03/2011  . Atrial fibrillation ([place]) 04/05/2011  . Hyperlipidemia 05/25/2009  . BINGE EATING DISORDER 05/25/2009   Past Medical History:  Diagnosis Date  . Angina    none since CABG  . Atrial fibrillation ([place])   . BINGE EATING DISORDER 05/25/2009  . Blood transfusion    6/12  following GI Bleed with coumadin therapy  . BPH (benign prostatic hyperplasia)   . CAD (coronary artery disease)   . Chest pain 03/05/2011  . Chronic kidney disease    kidney stones years ago  . Dysrhythmia  atrial fib post Cabg/ 5 days Coumadin only  . Elevated PSA   . Gastric ulcer   . History of nephrolithiasis   . Hyperlipidemia   . HYPERLIPIDEMIA 05/25/2009  . Left inguinal hernia   . Obesity   . Symptoms involving urinary system   . Urgency of urination     Family History  Problem Relation Age of Onset  . Coronary artery disease Father   . Heart attack Father 31  . Cancer Father   . Lymphoma    . Diabetes Sister   . Heart attack Sister 52  . Ovarian cancer Sister   . Atrial fibrillation  Brother   . Colon cancer Neg Hx   . Stomach cancer Neg Hx     Past Surgical History:  Procedure Laterality Date  . CARDIAC CATHETERIZATION     6/12  . CORONARY ARTERY BYPASS GRAFT  03/27/2011   x3, Dr Tharon Aquas Trigt    . ESOPHAGOGASTRODUODENOSCOPY ENDOSCOPY  04/12/11   cauterization of bleeding ulcer  and artery in duodenum and stomach   . INGUINAL HERNIA REPAIR  11/06/2011   Procedure: HERNIA REPAIR INGUINAL ADULT;  Surgeon: Earnstine Regal, MD;  Location: [facility];  Service: General;  Laterality: N/A;  Repair left inguinal hernia with mesh  . POLYPECTOMY     x 2  . WISDOM TOOTH EXTRACTION     Social History   Occupational History  . retired     Social History Main Topics  . Smoking status: Former Smoker    Packs/day: 1.00    Years: 25.00    Quit date: 03/04/1988  . Smokeless tobacco: Never Used  . Alcohol use 0.6 oz/week    1 Cans of beer per week     Comment: occ  . Drug use: No  . Sexual activity: Not on file

## 2016-11-10 ENCOUNTER — Ambulatory Visit
Admission: RE | Admit: 2016-11-10 | Discharge: 2016-11-10 | Disposition: A | Discharge status: Home / Self Care | Payer: Medicare Other | Source: Ambulatory Visit | Attending: Orthopaedic Surgery | Admitting: Orthopaedic Surgery

## 2016-11-10 DIAGNOSIS — M19011 Primary osteoarthritis, right shoulder: Secondary | ICD-10-CM

## 2016-11-10 DIAGNOSIS — M25511 Pain in right shoulder: Secondary | ICD-10-CM | POA: Diagnosis not present

## 2016-11-13 ENCOUNTER — Ambulatory Visit (INDEPENDENT_AMBULATORY_CARE_PROVIDER_SITE_OTHER): Payer: Medicare Other | Admitting: Orthopaedic Surgery

## 2016-11-13 ENCOUNTER — Telehealth: Payer: Self-pay | Admitting: *Deleted

## 2016-11-13 ENCOUNTER — Encounter (INDEPENDENT_AMBULATORY_CARE_PROVIDER_SITE_OTHER): Payer: Self-pay | Admitting: Orthopaedic Surgery

## 2016-11-13 VITALS — BP 94/62 | HR 92 | Ht 66.0 in | Wt 202.0 lb

## 2016-11-13 DIAGNOSIS — M75121 Complete rotator cuff tear or rupture of right shoulder, not specified as traumatic: Secondary | ICD-10-CM

## 2016-11-13 NOTE — Progress Notes (Signed)
Office Visit Note   Patient: Blake [name]           Date of Birth: [birth year]           MRN: [id number] Visit Date: 11/13/2016              Requested by: Janith Lima, MD [address] [place], [place] [address] PCP: Scarlette Calico, MD   Assessment & Plan: Visit Diagnoses: Rotator cuff tear right shoulder involving infra and supraspinatus tendons with retraction  Plan: Have discussed surgical treatment including an arthroscopic subacromial decompression, distal clavicle resection and a mini open rotator cuff tear repair with or without a dermis band patch. Blake [name] will need cardiology clearance from Dr. Stanford Breed  Follow-Up Instructions: No Follow-up on file.   Orders:  No orders of the defined types were placed in this encounter.  No orders of the defined types were placed in this encounter.     Procedures: No procedures performed   Clinical Data: No additional findings.  MRI scan of the right shoulder demonstrates complete tear of the supraspinatus and infraspinatus tendons with 3.7 cm of retraction. There is severe tendinosis of the leading edge of the infraspinatus tendon and severe edema in the infraspinatus muscle teres minor and subscapularis tendons are intact. No significant atrophy of the rotator cuff muscles. I sepsis long head is intact. Moderate arthropathy of the acromioclavicular joint is identified with inferiorly directed hypertrophic changes. Type II acromion mild chondral thinning of the glenohumeral joint and old posttraumatic deformity of the proximal humeral diaphysis previously demonstrated by plain film Subjective: Chief Complaint  Patient presents with  . Right Shoulder - Results    Pt here today for MRI results for his Right shoulder.  He states the pain has not increased since last visit.  Pt states his Left shoulder is starting to bother him  Blake [name] has had facial pain in his right shoulder over time but only in the last  month or 6 weeks as he had persistent pain to the point of compromise. He is having difficulty raising his arm over his head associated  with weakness in his right upper extremity. He is right-hand dominant.  Review of Systems   Objective: Vital Signs: There were no vitals taken for this visit.  Physical Exam  Ortho Exam exam of the right shoulder demonstrates a painful overhead arc of motion. He did have full overhead movement. Positive impingement and positive empty can testing. Biceps appears to be intact. This particularly with external rotation and grip and release. Neurovascular exam intact right hand.  Specialty Comments:  No specialty comments available.  Imaging: No results found.   PMFS History: Patient Active Problem List   Diagnosis Date Noted  . Pain in joint of right shoulder 10/09/2016  . Primary osteoarthritis of right shoulder 10/09/2016  . Spinal stenosis of lumbar region at multiple levels 12/01/2015  . Routine general medical examination at a health care facility 11/08/2015  . Obesity (BMI 30-39.9) 10/28/2012  . BPH (benign prostatic hyperplasia) 06/08/2011  . Gastric ulcer 06/08/2011  . Coronary artery disease 05/03/2011  . Atrial fibrillation ([place]) 04/05/2011  . Hyperlipidemia 05/25/2009  . BINGE EATING DISORDER 05/25/2009   Past Medical History:  Diagnosis Date  . Angina    none since CABG  . Atrial fibrillation ([place])   . BINGE EATING DISORDER 05/25/2009  . Blood transfusion    6/12  following GI Bleed with coumadin therapy  . BPH (benign  prostatic hyperplasia)   . CAD (coronary artery disease)   . Chest pain 03/05/2011  . Chronic kidney disease    kidney stones years ago  . Dysrhythmia    atrial fib post Cabg/ 5 days Coumadin only  . Elevated PSA   . Gastric ulcer   . History of nephrolithiasis   . Hyperlipidemia   . HYPERLIPIDEMIA 05/25/2009  . Left inguinal hernia   . Obesity   . Symptoms involving urinary system   . Urgency of urination       Family History  Problem Relation Age of Onset  . Coronary artery disease Father   . Heart attack Father 81  . Cancer Father   . Lymphoma    . Diabetes Sister   . Heart attack Sister 59  . Ovarian cancer Sister   . Atrial fibrillation Brother   . Colon cancer Neg Hx   . Stomach cancer Neg Hx     Past Surgical History:  Procedure Laterality Date  . CARDIAC CATHETERIZATION     6/12  . CORONARY ARTERY BYPASS GRAFT  03/27/2011   x3, Dr Tharon Aquas Trigt    . ESOPHAGOGASTRODUODENOSCOPY ENDOSCOPY  04/12/11   cauterization of bleeding ulcer  and artery in duodenum and stomach   . INGUINAL HERNIA REPAIR  11/06/2011   Procedure: HERNIA REPAIR INGUINAL ADULT;  Surgeon: Earnstine Regal, MD;  Location: [facility];  Service: General;  Laterality: N/A;  Repair left inguinal hernia with mesh  . POLYPECTOMY     x 2  . WISDOM TOOTH EXTRACTION     Social History   Occupational History  . retired     Social History Main Topics  . Smoking status: Former Smoker    Packs/day: 1.00    Years: 25.00    Quit date: 03/04/1988  . Smokeless tobacco: Never Used  . Alcohol use 0.6 oz/week    1 Cans of beer per week     Comment: occ  . Drug use: No  . Sexual activity: Not on file

## 2016-11-13 NOTE — Telephone Encounter (Signed)
Hold apixaban 2 days prior to surgery and resume postop when ok with surgery and hemostasis achieved. Kirk Ruths

## 2016-11-13 NOTE — Telephone Encounter (Signed)
Patient brought in surgical clearance request  Request for surgical clearance:  1. What type of surgery is being performed? Right rotator cuff tear repair   2. When is this surgery scheduled? Unknown    3. Are there any medications that need to be held prior to surgery and how long?Eliquis 5 days prior    4. Name of physician performing surgery? Dr Joni Fears   5. What is your office phone and fax number? [phone] fax [phone]    Will forward to Dr Stanford Breed for review

## 2016-11-13 NOTE — Telephone Encounter (Signed)
Sent via Epic

## 2016-11-26 ENCOUNTER — Telehealth: Payer: Self-pay | Admitting: Cardiology

## 2016-11-26 NOTE — Telephone Encounter (Signed)
Spoke with pt, aware note re-faxed

## 2016-11-26 NOTE — Telephone Encounter (Signed)
New Message     He dropped off a form regarding surgery with Dr Durward Fortes and Dr Durward Fortes said that they did not get the form back for the surgery, could you please re fax it over to dr Durward Fortes asap    [phone] fax number

## 2016-11-27 ENCOUNTER — Telehealth: Payer: Self-pay | Admitting: Cardiology

## 2016-11-27 NOTE — Telephone Encounter (Signed)
[facility] called stating that they received what was put in Epic for patient, but it did not state in actual words that pt is cleared for surgery. Stated what patient needed to do before and after surgery, but not if they were actually cleared. Requesting call back

## 2016-11-27 NOTE — Telephone Encounter (Signed)
Left message for Blake [name], per dr Stanford Breed the patient is cleared for surgery.

## 2017-01-03 NOTE — H&P (Addendum)
Blake Fears, MD   Biagio Borg, PA-C [address], [place], [place]  [address]                             [phone]   ORTHOPAEDIC HISTORY & PHYSICAL  Blake [name] MRN:  [name] DOB/SEX:  September 25, 1946/male  CHIEF COMPLAINT:  Painful right shoulder pain  HISTORY: Blake [name] presents for chronic and now worsening right shoulder pain with decreased ROM. The pain interferes with his sleep so he takes norco for the pain. He denies any recent trauma or injury referred to [place] by Dr. Scarlette Calico, PCP. Xrays were obtained in his office 10/10/17. Blake [name] relates fracturing his right shoulder as a teenager. He does remember any limitations of activities until the onset of this present pain. A month ago he had a cortisone injection which alleviated his pain up until several days ago. He's had difficulty sleeping on his right shoulder as well as placing it in an overhead position. He is right-hand-dominant.  MRI scan of the right shoulder demonstrates complete tear of the supraspinatus and infraspinatus tendons with 3.7 cm of retraction. There is severe tendinosis of the leading edge of the infraspinatus tendon and severe edema in the infraspinatus muscle teres minor and subscapularis tendons are intact. No significant atrophy of the rotator cuff muscles. I sepsis long head is intact. Moderate arthropathy of the acromioclavicular joint is identified with inferiorly directed hypertrophic changes. Type II acromion mild chondral thinning of the glenohumeral joint and old posttraumatic deformity of the proximal humeral diaphysis previously demonstrated by plain film:    PAST MEDICAL HISTORY: Patient Active Problem List   Diagnosis Date Noted  . Pain in joint of right shoulder 10/09/2016  . Primary osteoarthritis of right shoulder 10/09/2016  . Spinal stenosis of lumbar region at multiple levels 12/01/2015  . Routine general medical examination at a health care facility  11/08/2015  . Obesity (BMI 30-39.9) 10/28/2012  . BPH (benign prostatic hyperplasia) 06/08/2011  . Gastric ulcer 06/08/2011  . Coronary artery disease 05/03/2011  . Atrial fibrillation ([name]) 04/05/2011  . Hyperlipidemia 05/25/2009  . BINGE EATING DISORDER 05/25/2009   Past Medical History:  Diagnosis Date  . Angina    none since CABG  . Arthritis   . Bladder spasm    takes Levsin as needed  . CAD (coronary artery disease)   . Diverticulosis   . Duodenal ulcer    hx of  . Dysrhythmia    atrial fib post Cabg/ takes Eliquis daily as well as Metoprolol   . GERD (gastroesophageal reflux disease)    takes Protonix daily  . History of blood transfusion    not abnormal reaction  . History of colon polyps    benign  . History of kidney stones    hx of  . Hyperlipidemia    takes Atorvastatin daily  . HYPERLIPIDEMIA 05/25/2009  . Joint pain   . Nocturia   . PAF (paroxysmal atrial fibrillation) ([place])    Past Surgical History:  Procedure Laterality Date  . CARDIAC CATHETERIZATION     6/12  . COLONOSCOPY    . CORONARY ARTERY BYPASS GRAFT  03/27/2011   x3, Dr Tharon Aquas Trigt    . ESOPHAGOGASTRODUODENOSCOPY ENDOSCOPY  04/12/11   cauterization of bleeding ulcer  and artery in duodenum and stomach   . INGUINAL HERNIA REPAIR  11/06/2011   Procedure: HERNIA REPAIR INGUINAL ADULT;  Surgeon: Sherren Mocha  Leeanne Mannan, MD;  Location: [facility];  Service: General;  Laterality: N/A;  Repair left inguinal hernia with mesh  . WISDOM TOOTH EXTRACTION       MEDICATIONS:    Current Facility-Administered Medications  Medication Dose Route Frequency Provider Last Rate Last Dose  . 0.9 %  sodium chloride infusion  75 mL/hr Intravenous Continuous Garald Balding, MD      . acetaminophen (OFIRMEV) IV 1,000 mg  1,000 mg Intravenous Once Garald Balding, MD      . ceFAZolin (ANCEF) IVPB 2g/100 mL premix  2 g Intravenous To SS-Surg Garald Balding, MD      . lactated ringers infusion   Intravenous Continuous  Belinda Block, MD 10 mL/hr at 01/29/17 [id number]       ALLERGIES:   Allergies  Allergen Reactions  . No Known Allergies     REVIEW OF SYSTEMS:  Review of Systems  Constitutional: Negative for chills, fatigue and fever.  HENT: Negative.   Respiratory: Negative for chest tightness, shortness of breath and stridor.   Cardiovascular: Negative for chest pain, palpitations and leg swelling.  Gastrointestinal: Negative for abdominal pain, constipation, diarrhea and vomiting.  Endocrine: Negative.   Genitourinary: Negative.   Musculoskeletal: Positive for arthralgias. Negative for back pain, joint swelling, myalgias and neck pain.  Neurological: Negative.   Hematological: Negative.  Negative for adenopathy. Does not bruise/bleed easily.  Psychiatric/Behavioral: Negative.      FAMILY HISTORY:   Family History  Problem Relation Age of Onset  . Coronary artery disease Father   . Heart attack Father 65  . Cancer Father   . Lymphoma    . Diabetes Sister   . Heart attack Sister 43  . Ovarian cancer Sister   . Atrial fibrillation Brother   . Colon cancer Neg Hx   . Stomach cancer Neg Hx     SOCIAL HISTORY:   Social History  Substance Use Topics  . Smoking status: Former Smoker    Packs/day: 1.00    Years: 25.00    Quit date: 03/04/1988  . Smokeless tobacco: Never Used  . Alcohol use 0.6 oz/week    1 Cans of beer per week     Comment: occ      EXAMINATION: Vital signs in last 24 hours: Temp:  [97.7 F (36.5 C)] 97.7 F (36.5 C) (04/10 0833) Pulse Rate:  [89] 89 (04/10 0833) Resp:  [20] 20 (04/10 0833) BP: (121)/(72) 121/72 (04/10 0833) SpO2:  [98 %] 98 % (04/10 0833) Weight:  [200 lb (90.7 kg)] 200 lb (90.7 kg) (04/10 0833)   Head is normocephalic.   Eyes:  Pupils equal, round and reactive to light and accommodation.  Extraocular intact. ENT: Ears, nose, and throat were benign.   Neck: supple, no bruits were noted.   Chest: good expansion.   Lungs: essentially  clear.   Cardiac: regular rhythm and rate, normal S1, S2.  No murmurs appreciated. Pulses :  2+ bilateral and symmetric in upper extremities. Abdomen is scaphoid, soft, nontender, no masses palpable, normal bowel sounds present. CNS:  He is oriented x3 and cranial nerves II-XII grossly intact. Breast, rectal, and genital exams: not performed and not indicated for an orthopedic evaluation. Musculoskeletal:right shoulder exam with positive impingement. Full overhead motion slowly with a painful arc. Positive empty can testing. Some crepitation. Good grip and release. Motor strength appears to be normal with patient able to touch the lower part of his back with his right hand. Slight loss  of external rotation compared to the left shoulder.    Imaging Review MRI scan of the right shoulder demonstrates complete tear of the supraspinatus and infraspinatus tendons with 3.7 cm of retraction. There is severe tendinosis of the leading edge of the infraspinatus tendon and severe edema in the infraspinatus muscle teres minor and subscapularis tendons are intact. No significant atrophy of the rotator cuff muscles. I sepsis long head is intact. Moderate arthropathy of the acromioclavicular joint is identified with inferiorly directed hypertrophic changes. Type II acromion mild chondral thinning of the glenohumeral joint and old posttraumatic deformity of the proximal humeral diaphysis previously demonstrated by plain film  ASSESSMENT: right shoulder complete tears of the supraspinatus and infraspinatus tendon with before meals arthropathy  Past Medical History:  Diagnosis Date  . Angina    none since CABG  . Arthritis   . Bladder spasm    takes Levsin as needed  . CAD (coronary artery disease)   . Diverticulosis   . Duodenal ulcer    hx of  . Dysrhythmia    atrial fib post Cabg/ takes Eliquis daily as well as Metoprolol   . GERD (gastroesophageal reflux disease)    takes Protonix daily  . History of  blood transfusion    not abnormal reaction  . History of colon polyps    benign  . History of kidney stones    hx of  . Hyperlipidemia    takes Atorvastatin daily  . HYPERLIPIDEMIA 05/25/2009  . Joint pain   . Nocturia   . PAF (paroxysmal atrial fibrillation) ([place])     PLAN: Plan for Right shoulder arthroscopic SA D, DCR, mini open rotator cuff repair with possible dermaspan patch  The procedure,  risks, and benefits of surgery were presented and reviewed. The risks including but not limited to infection, blood clots, vascular and nerve injury, stiffness,  among others were discussed. The patient acknowledged the explanation, agreed to proceed.   Mike Craze [place], [place] [phone]  01/29/2017 9:52 AM

## 2017-01-22 NOTE — Pre-Procedure Instructions (Signed)
Blake [name]  01/22/2017      [facility] [address], [place] [address] Phone: [phone] Fax: [phone]  [facility], [facility] [address] [place] [address] [place] [address] Phone: [phone] Fax: [phone]    Your procedure is scheduled on Tues, April 10 @ 10:15 AM  Report to [facility] at 8:15 AM  Call this number if you have problems the morning of surgery:  [phone]   Remember:  Do not eat food or drink liquids after midnight.  Take these medicines the morning of surgery with A SIP OF WATER Uroxatral(Alfuzosin),Metoprolol(Lopressor),and Pantoprazole(Protonix)             Hold Eliquis 2 days prior to surgery. No Goody's,BC's,Aleve,Advil,Motrin,Ibuprofen,Fish Oil,or any Herbal Medications.    Do not wear jewelry.  Do not wear lotions, powders,colognes, or deoderant.  Do not shave 48 hours prior to surgery.  Men may shave face and neck.  Do not bring valuables to the hospital.  [facility] is not responsible for any belongings or valuables.  Contacts, dentures or bridgework may not be worn into surgery.  Leave your suitcase in the car.  After surgery it may be brought to your room.  For patients admitted to the hospital, discharge time will be determined by your treatment team.  Patients discharged the day of surgery will not be allowed to drive home.    [facility]  Before surgery, you can play an important role.  Because skin is not sterile, your skin needs to be as free of germs as possible.  You can reduce the number of germs on you skin by washing with CHG (chlorahexidine gluconate) soap before surgery.  CHG is an antiseptic cleaner which kills germs and bonds with the skin to continue killing germs even after washing.  Please DO NOT use if you have an allergy to  CHG or antibacterial soaps.  If your skin becomes reddened/irritated stop using the CHG and inform your nurse when you arrive at Short Stay.  Do not shave (including legs and underarms) for at least 48 hours prior to the first CHG shower.  You may shave your face.  Please follow these instructions carefully:   1.  Shower with CHG Soap the night before surgery and the                                morning of Surgery.  2.  If you choose to wash your hair, wash your hair first as usual with your       normal shampoo.  3.  After you shampoo, rinse your hair and body thoroughly to remove the                      Shampoo.  4.  Use CHG as you would any other liquid soap.  You can apply chg directly       to the skin and wash gently with scrungie or a clean washcloth.  5.  Apply the CHG Soap to your body ONLY FROM THE NECK DOWN.        Do not use on open wounds or open sores.  Avoid contact with your eyes,       ears, mouth and genitals (  private parts).  Wash genitals (private parts)       with your normal soap.  6.  Wash thoroughly, paying special attention to the area where your surgery        will be performed.  7.  Thoroughly rinse your body with warm water from the neck down.  8.  DO NOT shower/wash with your normal soap after using and rinsing off       the CHG Soap.  9.  Pat yourself dry with a clean towel.            10.  Wear clean pajamas.            11.  Place clean sheets on your bed the night of your first shower and do not        sleep with pets.  Day of Surgery  Do not apply any lotions/deoderants the morning of surgery.  Please wear clean clothes to the hospital/surgery center.    Please read over the following fact sheets that you were given. Pain Booklet and Surgical Site Infection Prevention

## 2017-01-23 ENCOUNTER — Encounter (HOSPITAL_COMMUNITY)
Admission: RE | Admit: 2017-01-23 | Discharge: 2017-01-23 | Disposition: A | Discharge status: Home / Self Care | Payer: Medicare Other | Source: Ambulatory Visit | Attending: Orthopaedic Surgery | Admitting: Orthopaedic Surgery

## 2017-01-23 ENCOUNTER — Encounter (HOSPITAL_COMMUNITY): Payer: Self-pay

## 2017-01-23 DIAGNOSIS — Z01818 Encounter for other preprocedural examination: Secondary | ICD-10-CM | POA: Diagnosis not present

## 2017-01-23 DIAGNOSIS — M19011 Primary osteoarthritis, right shoulder: Secondary | ICD-10-CM | POA: Diagnosis not present

## 2017-01-23 HISTORY — DX: Pain in unspecified joint: M25.50

## 2017-01-23 HISTORY — DX: Paroxysmal atrial fibrillation: I48.0

## 2017-01-23 HISTORY — DX: Other specified disorders of bladder: N32.89

## 2017-01-23 HISTORY — DX: Unspecified osteoarthritis, unspecified site: M19.90

## 2017-01-23 HISTORY — DX: Duodenal ulcer, unspecified as acute or chronic, without hemorrhage or perforation: K26.9

## 2017-01-23 HISTORY — DX: Gastro-esophageal reflux disease without esophagitis: K21.9

## 2017-01-23 HISTORY — DX: Personal history of urinary calculi: Z87.442

## 2017-01-23 HISTORY — DX: Personal history of colon polyps, unspecified: Z86.0100

## 2017-01-23 HISTORY — DX: Nocturia: R35.1

## 2017-01-23 HISTORY — DX: Personal history of colonic polyps: Z86.010

## 2017-01-23 HISTORY — DX: Diverticulosis of intestine, part unspecified, without perforation or abscess without bleeding: K57.90

## 2017-01-23 HISTORY — DX: Personal history of other medical treatment: Z92.89

## 2017-01-23 LAB — COMPREHENSIVE METABOLIC PANEL
ALBUMIN: 3.8 g/dL (ref 3.5–5.0)
ALK PHOS: 68 U/L (ref 38–126)
ALT: 19 U/L (ref 17–63)
ANION GAP: 7 (ref 5–15)
AST: 23 U/L (ref 15–41)
BILIRUBIN TOTAL: 1 mg/dL (ref 0.3–1.2)
BUN: 12 mg/dL (ref 6–20)
CO2: 24 mmol/L (ref 22–32)
Calcium: 8.9 mg/dL (ref 8.9–10.3)
Chloride: 106 mmol/L (ref 101–111)
Creatinine, Ser: 0.77 mg/dL (ref 0.61–1.24)
GFR calc Af Amer: >60 mL/min (ref >60)
GFR calc non Af Amer: >60 mL/min (ref >60)
GLUCOSE: 100 mg/dL — AB (ref 65–99)
POTASSIUM: 4.1 mmol/L (ref 3.5–5.1)
SODIUM: 137 mmol/L (ref 135–145)
Total Protein: 6.9 g/dL (ref 6.5–8.1)

## 2017-01-23 LAB — CBC WITH DIFFERENTIAL/PLATELET
BASOS ABS: 0.1 10*3/uL (ref 0.0–0.1)
BASOS PCT: 1 %
EOS ABS: 0.4 10*3/uL (ref 0.0–0.7)
Eosinophils Relative: 4 %
HEMATOCRIT: 42.9 % (ref 39.0–52.0)
HEMOGLOBIN: 14 g/dL (ref 13.0–17.0)
Lymphocytes Relative: 23 %
Lymphs Abs: 2 10*3/uL (ref 0.7–4.0)
MCH: 31.6 pg (ref 26.0–34.0)
MCHC: 32.6 g/dL (ref 30.0–36.0)
MCV: 96.8 fL (ref 78.0–100.0)
MONOS PCT: 13 %
Monocytes Absolute: 1.1 10*3/uL — ABNORMAL HIGH (ref 0.1–1.0)
NEUTROS ABS: 5.2 10*3/uL (ref 1.7–7.7)
NEUTROS PCT: 59 %
Platelets: 227 10*3/uL (ref 150–400)
RBC: 4.43 MIL/uL (ref 4.22–5.81)
RDW: 13.2 % (ref 11.5–15.5)
WBC: 8.8 10*3/uL (ref 4.0–10.5)

## 2017-01-23 LAB — URINALYSIS, ROUTINE W REFLEX MICROSCOPIC
BILIRUBIN URINE: NEGATIVE
Glucose, UA: NEGATIVE mg/dL
KETONES UR: NEGATIVE mg/dL
NITRITE: NEGATIVE
Protein, ur: NEGATIVE mg/dL
Specific Gravity, Urine: 1.01 (ref 1.005–1.030)
Squamous Epithelial / LPF: NONE SEEN
pH: 6 (ref 5.0–8.0)

## 2017-01-23 LAB — PROTIME-INR
INR: 1.23
Prothrombin Time: 15.6 seconds — ABNORMAL HIGH (ref 11.4–15.2)

## 2017-01-23 LAB — APTT: APTT: 34 s (ref 24–36)

## 2017-01-23 MED ORDER — CHLORHEXIDINE GLUCONATE 4 % EX LIQD: 60.0000 mL | Freq: Once | CUTANEOUS | Status: DC

## 2017-01-23 NOTE — Progress Notes (Addendum)
Cardiologist is Dr.Brian Crenshaw with last visit in epic from 09-04-16. Sees on a yrly basis   Medical Md is Dr.Thomas Ronnald Ramp  Echo report in epic from 2014  Stress test report in epic from 2006/2014  Heart cath in 2012  EKG in epic from 09-04-16  CXR denies in past yr

## 2017-01-24 ENCOUNTER — Encounter (HOSPITAL_COMMUNITY): Payer: Self-pay

## 2017-01-24 NOTE — Progress Notes (Signed)
Anesthesia Chart Review:  Pt is a 71 year old male scheduled for R shoulder arthroscopy with subacromial decompression and distal clavicle excision, mini open rotator cuff repair, dermaband patch on 01/29/2017 with Joni Fears, M.D.  - PCP is Scarlette Calico, MD who referred pt for surgery - Cardiologist is Kirk Ruths, MD, who has cleared pt for surgery.   PMH includes: CAD (s/p CABG 04/12/11), PAF, hyperlipidemia, GERD. Former smoker. BMI 32. S/p inguinal hernia repair 11/06/11  Medications include: eliquis, lipitor, metoprolol, protonix. Pt to stop eliquis 2 days before surgery.   Preoperative labs reviewed.  PT 15.6, PTT normal. Will recheck PT DOS.   EKG 09/04/16: atrial fibrillation  Echo 09/29/13:  - Left ventricle: The cavity size was normal. Wall thickness was increased in a pattern of mild LVH. The estimated ejection fraction was 60%. Wall motion was normal; there were no regional wall motion abnormalities. - Mitral valve: Flat closure. Question slight posteriorprolapse (with trivial late systolic MR). - Left atrium: The atrium was moderately to severely dilated. - Right ventricle: The cavity size was normal. Systolic function was mildly reduced.  Nuclear stress test 08/13/13: Normal stress nuclear study. LV Ejection Fraction: 63%.  LV Wall Motion:  NL LV Function; NL Wall Motion  If PT acceptable DOS, I anticipate pt can proceed with surgery as scheduled.   Willeen Cass, FNP-BC [facility]/Anesthesiology Phone: [phone] 01/24/2017 3:30 PM

## 2017-01-28 MED ORDER — SODIUM CHLORIDE 0.9 % IV SOLN: 75.0000 mL/h | INTRAVENOUS | Status: DC

## 2017-01-28 MED ORDER — ACETAMINOPHEN 10 MG/ML IV SOLN
1000.0000 mg | Freq: Once | INTRAVENOUS | Status: AC
  Administered 2017-01-29: 1000 mg via INTRAVENOUS
  Filled 2017-01-28: qty 100

## 2017-01-28 MED ORDER — CEFAZOLIN SODIUM-DEXTROSE 2-4 GM/100ML-% IV SOLN
2.0000 g | INTRAVENOUS | Status: AC
  Administered 2017-01-29: 2 g via INTRAVENOUS
  Filled 2017-01-28: qty 100

## 2017-01-29 ENCOUNTER — Ambulatory Visit (HOSPITAL_COMMUNITY)
Admission: RE | Admit: 2017-01-29 | Discharge: 2017-01-29 | Disposition: A | Discharge status: Home / Self Care | Payer: Medicare Other | Source: Ambulatory Visit | Attending: Orthopaedic Surgery | Admitting: Orthopaedic Surgery

## 2017-01-29 ENCOUNTER — Encounter (HOSPITAL_COMMUNITY): Payer: Self-pay | Admitting: Surgery

## 2017-01-29 ENCOUNTER — Ambulatory Visit (HOSPITAL_COMMUNITY): Payer: Medicare Other | Admitting: Emergency Medicine

## 2017-01-29 ENCOUNTER — Encounter (HOSPITAL_COMMUNITY)
Admission: RE | Disposition: A | Discharge status: Home / Self Care | Payer: Self-pay | Source: Ambulatory Visit | Attending: Orthopaedic Surgery

## 2017-01-29 DIAGNOSIS — K219 Gastro-esophageal reflux disease without esophagitis: Secondary | ICD-10-CM | POA: Insufficient documentation

## 2017-01-29 DIAGNOSIS — Z87891 Personal history of nicotine dependence: Secondary | ICD-10-CM | POA: Insufficient documentation

## 2017-01-29 DIAGNOSIS — Z951 Presence of aortocoronary bypass graft: Secondary | ICD-10-CM | POA: Insufficient documentation

## 2017-01-29 DIAGNOSIS — M25511 Pain in right shoulder: Secondary | ICD-10-CM | POA: Diagnosis present

## 2017-01-29 DIAGNOSIS — I2510 Atherosclerotic heart disease of native coronary artery without angina pectoris: Secondary | ICD-10-CM | POA: Diagnosis not present

## 2017-01-29 DIAGNOSIS — S46211A Strain of muscle, fascia and tendon of other parts of biceps, right arm, initial encounter: Secondary | ICD-10-CM | POA: Insufficient documentation

## 2017-01-29 DIAGNOSIS — Z7901 Long term (current) use of anticoagulants: Secondary | ICD-10-CM | POA: Insufficient documentation

## 2017-01-29 DIAGNOSIS — I480 Paroxysmal atrial fibrillation: Secondary | ICD-10-CM | POA: Diagnosis not present

## 2017-01-29 DIAGNOSIS — M7541 Impingement syndrome of right shoulder: Secondary | ICD-10-CM | POA: Insufficient documentation

## 2017-01-29 DIAGNOSIS — G8918 Other acute postprocedural pain: Secondary | ICD-10-CM | POA: Diagnosis not present

## 2017-01-29 DIAGNOSIS — M19011 Primary osteoarthritis, right shoulder: Secondary | ICD-10-CM | POA: Diagnosis not present

## 2017-01-29 DIAGNOSIS — M7521 Bicipital tendinitis, right shoulder: Secondary | ICD-10-CM | POA: Diagnosis present

## 2017-01-29 DIAGNOSIS — M94211 Chondromalacia, right shoulder: Secondary | ICD-10-CM | POA: Insufficient documentation

## 2017-01-29 DIAGNOSIS — E785 Hyperlipidemia, unspecified: Secondary | ICD-10-CM | POA: Insufficient documentation

## 2017-01-29 DIAGNOSIS — M75121 Complete rotator cuff tear or rupture of right shoulder, not specified as traumatic: Secondary | ICD-10-CM | POA: Diagnosis not present

## 2017-01-29 DIAGNOSIS — M19019 Primary osteoarthritis, unspecified shoulder: Secondary | ICD-10-CM | POA: Diagnosis present

## 2017-01-29 DIAGNOSIS — Z79899 Other long term (current) drug therapy: Secondary | ICD-10-CM | POA: Diagnosis not present

## 2017-01-29 DIAGNOSIS — M75101 Unspecified rotator cuff tear or rupture of right shoulder, not specified as traumatic: Secondary | ICD-10-CM | POA: Diagnosis not present

## 2017-01-29 DIAGNOSIS — M65811 Other synovitis and tenosynovitis, right shoulder: Secondary | ICD-10-CM | POA: Diagnosis not present

## 2017-01-29 DIAGNOSIS — X58XXXA Exposure to other specified factors, initial encounter: Secondary | ICD-10-CM | POA: Insufficient documentation

## 2017-01-29 LAB — PROTIME-INR
INR: 1.05
PROTHROMBIN TIME: 13.7 s (ref 11.4–15.2)

## 2017-01-29 SURGERY — SHOULDER ARTHROSCOPY WITH SUBACROMIAL DECOMPRESSION AND DISTAL CLAVICLE EXCISION
Anesthesia: Regional | Site: Shoulder | Laterality: Right

## 2017-01-29 MED ORDER — MIDAZOLAM HCL 2 MG/2ML IJ SOLN
INTRAMUSCULAR | Status: AC
  Administered 2017-01-29: 2 mg via INTRAVENOUS
  Filled 2017-01-29: qty 2

## 2017-01-29 MED ORDER — OXYCODONE HCL 5 MG/5ML PO SOLN: 5.0000 mg | Freq: Once | ORAL | Status: DC | PRN

## 2017-01-29 MED ORDER — PROPOFOL 10 MG/ML IV BOLUS
INTRAVENOUS | Status: DC | PRN
  Administered 2017-01-29: 150 mg via INTRAVENOUS

## 2017-01-29 MED ORDER — PHENYLEPHRINE HCL 10 MG/ML IJ SOLN
INTRAVENOUS | Status: DC | PRN
  Administered 2017-01-29: 13:00:00 via INTRAVENOUS
  Administered 2017-01-29 (×2): 100 ug/min via INTRAVENOUS

## 2017-01-29 MED ORDER — 0.9 % SODIUM CHLORIDE (POUR BTL) OPTIME
TOPICAL | Status: DC | PRN
  Administered 2017-01-29: 1000 mL

## 2017-01-29 MED ORDER — MIDAZOLAM HCL 2 MG/2ML IJ SOLN
INTRAMUSCULAR | Status: AC
  Filled 2017-01-29: qty 2

## 2017-01-29 MED ORDER — KETAMINE HCL-SODIUM CHLORIDE 100-0.9 MG/10ML-% IV SOSY
PREFILLED_SYRINGE | INTRAVENOUS | Status: AC
  Filled 2017-01-29: qty 10

## 2017-01-29 MED ORDER — SUCCINYLCHOLINE CHLORIDE 20 MG/ML IJ SOLN
INTRAMUSCULAR | Status: DC | PRN
  Administered 2017-01-29: 120 mg via INTRAVENOUS

## 2017-01-29 MED ORDER — ONDANSETRON HCL 4 MG/2ML IJ SOLN
INTRAMUSCULAR | Status: DC | PRN
  Administered 2017-01-29: 4 mg via INTRAVENOUS

## 2017-01-29 MED ORDER — FENTANYL CITRATE (PF) 100 MCG/2ML IJ SOLN
100.0000 ug | Freq: Once | INTRAMUSCULAR | Status: AC
  Administered 2017-01-29: 100 ug via INTRAVENOUS

## 2017-01-29 MED ORDER — HYDROMORPHONE HCL 1 MG/ML IJ SOLN: 0.2500 mg | INTRAMUSCULAR | Status: DC | PRN

## 2017-01-29 MED ORDER — SUCCINYLCHOLINE CHLORIDE 200 MG/10ML IV SOSY
PREFILLED_SYRINGE | INTRAVENOUS | Status: AC
  Filled 2017-01-29: qty 10

## 2017-01-29 MED ORDER — FENTANYL CITRATE (PF) 100 MCG/2ML IJ SOLN
INTRAMUSCULAR | Status: AC
  Administered 2017-01-29: 100 ug via INTRAVENOUS
  Filled 2017-01-29: qty 2

## 2017-01-29 MED ORDER — ALBUMIN HUMAN 5 % IV SOLN
INTRAVENOUS | Status: DC | PRN
  Administered 2017-01-29: 12:00:00 via INTRAVENOUS

## 2017-01-29 MED ORDER — FENTANYL CITRATE (PF) 250 MCG/5ML IJ SOLN
INTRAMUSCULAR | Status: DC | PRN
  Administered 2017-01-29: 100 ug via INTRAVENOUS

## 2017-01-29 MED ORDER — LACTATED RINGERS IV SOLN
INTRAVENOUS | Status: DC
  Administered 2017-01-29 (×3): via INTRAVENOUS

## 2017-01-29 MED ORDER — KETAMINE HCL 10 MG/ML IJ SOLN
INTRAMUSCULAR | Status: DC | PRN
  Administered 2017-01-29: 20 mg via INTRAVENOUS

## 2017-01-29 MED ORDER — ALBUTEROL SULFATE HFA 108 (90 BASE) MCG/ACT IN AERS
INHALATION_SPRAY | RESPIRATORY_TRACT | Status: DC | PRN
  Administered 2017-01-29: 2 via RESPIRATORY_TRACT

## 2017-01-29 MED ORDER — MIDAZOLAM HCL 2 MG/2ML IJ SOLN
2.0000 mg | Freq: Once | INTRAMUSCULAR | Status: AC
  Administered 2017-01-29: 2 mg via INTRAVENOUS

## 2017-01-29 MED ORDER — BUPIVACAINE HCL (PF) 0.25 % IJ SOLN
INTRAMUSCULAR | Status: AC
  Filled 2017-01-29: qty 30

## 2017-01-29 MED ORDER — SODIUM CHLORIDE 0.9 % IR SOLN
Status: DC | PRN
  Administered 2017-01-29 (×4): 3000 mL

## 2017-01-29 MED ORDER — FENTANYL CITRATE (PF) 250 MCG/5ML IJ SOLN
INTRAMUSCULAR | Status: AC
  Filled 2017-01-29: qty 5

## 2017-01-29 MED ORDER — BUPIVACAINE-EPINEPHRINE (PF) 0.5% -1:200000 IJ SOLN
INTRAMUSCULAR | Status: DC | PRN
  Administered 2017-01-29: 30 mL via PERINEURAL

## 2017-01-29 MED ORDER — PHENYLEPHRINE HCL 10 MG/ML IJ SOLN
INTRAMUSCULAR | Status: DC | PRN
  Administered 2017-01-29 (×2): 80 ug via INTRAVENOUS
  Administered 2017-01-29 (×2): 120 ug via INTRAVENOUS

## 2017-01-29 MED ORDER — LIDOCAINE 2% (20 MG/ML) 5 ML SYRINGE
INTRAMUSCULAR | Status: AC
  Filled 2017-01-29: qty 5

## 2017-01-29 MED ORDER — STERILE WATER FOR INJECTION IJ SOLN
INTRAMUSCULAR | Status: DC | PRN
  Administered 2017-01-29: 1000 mL

## 2017-01-29 MED ORDER — OXYCODONE HCL 5 MG PO TABS: 5.0000 mg | ORAL_TABLET | Freq: Once | ORAL | Status: DC | PRN

## 2017-01-29 MED ORDER — GLYCOPYRROLATE 0.2 MG/ML IJ SOLN
INTRAMUSCULAR | Status: DC | PRN
Start: 2017-01-29 — End: 2017-01-29
  Administered 2017-01-29: 0.2 mg via INTRAVENOUS

## 2017-01-29 MED ORDER — OXYCODONE-ACETAMINOPHEN 5-325 MG PO TABS: 1.0000 | ORAL_TABLET | ORAL | 0 refills | Status: DC | PRN

## 2017-01-29 MED ORDER — ONDANSETRON HCL 4 MG/2ML IJ SOLN
INTRAMUSCULAR | Status: AC
  Filled 2017-01-29: qty 2

## 2017-01-29 MED ORDER — LIDOCAINE HCL (CARDIAC) 20 MG/ML IV SOLN
INTRAVENOUS | Status: DC | PRN
  Administered 2017-01-29: 80 mg via INTRATRACHEAL

## 2017-01-29 MED ORDER — PROPOFOL 10 MG/ML IV BOLUS
INTRAVENOUS | Status: AC
  Filled 2017-01-29: qty 20

## 2017-01-29 SURGICAL SUPPLY — 65 items
ANCH SUT KNTLS SLF PNCH MTL (Anchor) ×1 IMPLANT
ANCHOR PEEK ALL THREAD (Anchor) ×3 IMPLANT
BLADE GREAT WHITE 4.2 (BLADE) ×1 IMPLANT
BLADE SAW SGTL 83.5X18.5 (BLADE) ×1 IMPLANT
BUR OVAL 6.0 (BURR) ×1 IMPLANT
CANNULA ACUFLEX KIT 5X76 (CANNULA) ×1 IMPLANT
CATH FOLEY 2WAY SLVR 30CC 16FR (CATHETERS) ×1 IMPLANT
COVER BACK TABLE 60X90IN (DRAPES) ×1 IMPLANT
COVER SURGICAL LIGHT HANDLE (MISCELLANEOUS) ×3 IMPLANT
DRAPE IMP U-DRAPE 54X76 (DRAPES) ×2 IMPLANT
DRAPE PROXIMA HALF (DRAPES) IMPLANT
DRSG MEPILEX BORDER 4X8 (GAUZE/BANDAGES/DRESSINGS) ×2 IMPLANT
DURAPREP 26ML APPLICATOR (WOUND CARE) ×2 IMPLANT
ELECT CAUTERY BLADE 6.4 (BLADE) ×2 IMPLANT
ELECT NDL TIP 2.8 STRL (NEEDLE) ×1 IMPLANT
ELECT NEEDLE TIP 2.8 STRL (NEEDLE) IMPLANT
ELECT REM PT RETURN 9FT ADLT (ELECTROSURGICAL) ×2
ELECTRODE REM PT RTRN 9FT ADLT (ELECTROSURGICAL) ×1 IMPLANT
FACESHIELD WRAPAROUND (MASK) IMPLANT
FACESHIELD WRAPAROUND OR TEAM (MASK) ×2 IMPLANT
GLOVE BIOGEL PI IND STRL 8 (GLOVE) ×1 IMPLANT
GLOVE BIOGEL PI IND STRL 8.5 (GLOVE) ×1 IMPLANT
GLOVE BIOGEL PI INDICATOR 8 (GLOVE) ×1
GLOVE BIOGEL PI INDICATOR 8.5 (GLOVE) ×1
GLOVE ECLIPSE 8.0 STRL XLNG CF (GLOVE) ×4 IMPLANT
GLOVE SURG ORTHO 8.5 STRL (GLOVE) ×4 IMPLANT
GOWN STRL REUS W/ TWL LRG LVL3 (GOWN DISPOSABLE) ×1 IMPLANT
GOWN STRL REUS W/TWL 2XL LVL3 (GOWN DISPOSABLE) ×2 IMPLANT
GOWN STRL REUS W/TWL LRG LVL3 (GOWN DISPOSABLE) ×2
HANDPIECE INTERPULSE COAX TIP (DISPOSABLE)
IMPL ANCHOR PEEK 4.5MM (Anchor) IMPLANT
IMPLANT ANCHOR PEEK 4.5MM (Anchor) ×2 IMPLANT
IV CATH 22GX1 FEP (IV SOLUTION) ×1 IMPLANT
IV NS IRRIG 3000ML ARTHROMATIC (IV SOLUTION) ×2 IMPLANT
KIT BASIN OR (CUSTOM PROCEDURE TRAY) ×2 IMPLANT
KIT ROOM TURNOVER OR (KITS) ×2 IMPLANT
MANIFOLD NEPTUNE II (INSTRUMENTS) ×2 IMPLANT
NDL SCORPION MULTI FIRE (NEEDLE) IMPLANT
NEEDLE SCORPION MULTI FIRE (NEEDLE) ×2 IMPLANT
NS IRRIG 1000ML POUR BTL (IV SOLUTION) ×3 IMPLANT
PACK SHOULDER (CUSTOM PROCEDURE TRAY) ×2 IMPLANT
PACK UNIVERSAL I (CUSTOM PROCEDURE TRAY) ×1 IMPLANT
PAD ARMBOARD 7.5X6 YLW CONV (MISCELLANEOUS) ×4 IMPLANT
SET ARTHROSCOPY TUBING (MISCELLANEOUS) ×2
SET ARTHROSCOPY TUBING LN (MISCELLANEOUS) IMPLANT
SET HNDPC FAN SPRY TIP SCT (DISPOSABLE) IMPLANT
SLING ARM IMMOBILIZER LRG (SOFTGOODS) ×3 IMPLANT
SMARTMIX MINI TOWER (MISCELLANEOUS)
SPONGE LAP 4X18 X RAY DECT (DISPOSABLE) ×5 IMPLANT
SPONGE SCRUB IODOPHOR (GAUZE/BANDAGES/DRESSINGS) ×2 IMPLANT
SPONGE SURGIFOAM ABS GEL SZ50 (HEMOSTASIS) IMPLANT
STAPLER VISISTAT 35W (STAPLE) IMPLANT
SUCTION FRAZIER HANDLE 10FR (MISCELLANEOUS)
SUCTION TUBE FRAZIER 10FR DISP (MISCELLANEOUS) ×1 IMPLANT
SUT ETHIBOND 0 MO6 C/R (SUTURE) ×1 IMPLANT
SUT ETHIBOND 2 0 SH (SUTURE) ×1 IMPLANT
SUT ETHIBOND NAB CT1 #1 30IN (SUTURE) ×7 IMPLANT
SUT MNCRL AB 4-0 PS2 18 (SUTURE) ×1 IMPLANT
SUT VIC AB 0 CT1 27 (SUTURE)
SUT VIC AB 0 CT1 27XBRD ANBCTR (SUTURE) ×2 IMPLANT
SUT VIC AB 2-0 FS1 27 (SUTURE) ×2 IMPLANT
SYR CONTROL 10ML LL (SYRINGE) IMPLANT
TOWER SMARTMIX MINI (MISCELLANEOUS) IMPLANT
WAND STAR VAC 90 (SURGICAL WAND) ×1 IMPLANT
WATER STERILE IRR 1000ML POUR (IV SOLUTION) ×1 IMPLANT

## 2017-01-29 NOTE — H&P (Signed)
The recent History & Physical has been reviewed. I have personally examined the patient today. There is no interval change to the documented History & Physical. The patient would like to proceed with the procedure.  Garald Balding 01/29/2017,  9:26 AM

## 2017-01-29 NOTE — Anesthesia Procedure Notes (Signed)
Procedure Name: Intubation Date/Time: 01/29/2017 10:30 AM Performed by: Belinda Block Pre-anesthesia Checklist: Patient identified, Emergency Drugs available, Suction available and Patient being monitored Patient Re-evaluated:Patient Re-evaluated prior to inductionOxygen Delivery Method: Circle system utilized Preoxygenation: Pre-oxygenation with 100% oxygen Intubation Type: IV induction Ventilation: Mask ventilation without difficulty Laryngoscope Size: Mac and 3 Grade View: Grade II Tube type: Oral Tube size: 7.0 mm Number of attempts: 2 (DLx1 with grade 2 view, tube would not pass.  Glidescope used with tube having difficult to pass.) Airway Equipment and Method: Video-laryngoscopy and Stylet Placement Confirmation: ETT inserted through vocal cords under direct vision,  positive ETCO2 and breath sounds checked- equal and bilateral Secured at: 23 cm Dental Injury: Teeth and Oropharynx as per pre-operative assessment  Difficulty Due To: Difficulty was unanticipated

## 2017-01-29 NOTE — Op Note (Signed)
PATIENT ID:      Blake [name]  MRN:     [id number] DOB/AGE:    05-05-1946 / 71 y.o.       OPERATIVE REPORT    DATE OF PROCEDURE:  01/29/2017       PREOPERATIVE DIAGNOSIS:   RIGHT SHOULDER IMPINGEMENT SYNDROME,WITH ROTATOR CUFF TEAR, AC JOINTARTHRITIS                                                       Estimated body mass index is 32.28 kg/m as calculated from the following:   Height as of 01/23/17: 5\' 6"  (1.676 m).   Weight as of this encounter: 200 lb (90.7 kg).     POSTOPERATIVE DIAGNOSIS:  SAME  WITH TEAR OF BICEPD TENDON                                                                 Estimated body mass index is 32.28 kg/m as calculated from the following:   Height as of 01/23/17: 5\' 6"  (1.676 m).   Weight as of this encounter: 200 lb (90.7 kg).     PROCEDURE:  Procedure(s): RIGHT SHOULDER ARTHROSCOPY WITH SUBACROMIAL DECOMPRESSION AND DISTAL CLAVICLE EXCISION, MINI OPEN ROTATOR CUFF REPAIR, BICEPS TENOTOMY     SURGEON:  Joni Fears, MD    ASSISTANT:   Biagio Borg, PA-C   (Present and scrubbed throughout the case, critical for assistance with exposure, retraction, instrumentation, and closure.)          ANESTHESIA: regional and general     DRAINS: none :      TOURNIQUET TIME: * No tourniquets in log *    COMPLICATIONS:  None   CONDITION:  stable  PROCEDURE IN [id number]    Blake [name] 01/29/2017, 12:52 PM

## 2017-01-29 NOTE — Anesthesia Procedure Notes (Signed)
Anesthesia Regional Block: Interscalene brachial plexus block   Pre-Anesthetic Checklist: ,, timeout performed, Correct Patient, Correct Site, Correct Laterality, Correct Procedure, Correct Position, site marked, Risks and benefits discussed,  Surgical consent,  Pre-op evaluation,  At surgeon's request and post-op pain management  Laterality: Upper and Right  Prep: chloraprep       Needles:  Injection technique: Single-shot  Needle Type: Echogenic Stimulator Needle          Additional Needles:   Procedures: ultrasound guided,,,,,,,,  Narrative:  Start time: 01/29/2017 9:49 AM End time: 01/29/2017 9:56 AM Injection made incrementally with aspirations every 5 mL.  Performed by: Personally  Anesthesiologist: Glorianne Proctor  Additional Notes: H+P and labs reviewed, risks and benefits discussed with patient, procedure tolerated well without complications

## 2017-01-29 NOTE — Anesthesia Preprocedure Evaluation (Addendum)
Anesthesia Evaluation  Patient identified by MRN, date of birth, ID band Patient awake    Reviewed: Allergy & Precautions, NPO status , Patient's Chart, lab work & pertinent test results, reviewed documented beta blocker date and time   History of Anesthesia Complications Negative for: history of anesthetic complications  Airway Mallampati: II  TM Distance: >3 FB Neck ROM: Full    Dental  (+) Teeth Intact,    Pulmonary former smoker,    breath sounds clear to auscultation       Cardiovascular (-) angina+ CAD and + CABG  + dysrhythmias Atrial Fibrillation  Rhythm:Regular Rate:Normal     Neuro/Psych PSYCHIATRIC DISORDERS negative neurological ROS     GI/Hepatic Neg liver ROS, PUD, GERD  Medicated and Controlled,  Endo/Other    Renal/GU negative Renal ROS     Musculoskeletal  (+) Arthritis ,   Abdominal   Peds  Hematology   Anesthesia Other Findings On Eliquis, held for procedure, s/p CABG without current cardiac symptoms, post surgical afib stable.   Reproductive/Obstetrics                           Anesthesia Physical Anesthesia Plan  ASA: III  Anesthesia Plan: General   Post-op Pain Management:  Regional for Post-op pain   Induction: Intravenous  Airway Management Planned: Oral ETT  Additional Equipment: None  Intra-op Plan:   Post-operative Plan: Extubation in OR  Informed Consent: I have reviewed the patients History and Physical, chart, labs and discussed the procedure including the risks, benefits and alternatives for the proposed anesthesia with the patient or authorized representative who has indicated his/her understanding and acceptance.   Dental advisory given  Plan Discussed with: CRNA, Anesthesiologist and Surgeon  Anesthesia Plan Comments:       Anesthesia Quick Evaluation

## 2017-01-29 NOTE — Transfer of Care (Signed)
Immediate Anesthesia Transfer of Care Note  Patient: Blake [name]  Procedure(s) Performed: Procedure(s): RIGHT SHOULDER ARTHROSCOPY WITH SUBACROMIAL DECOMPRESSION AND DISTAL CLAVICLE EXCISION, MINI OPEN ROTATOR CUFF REPAIR, DERMASPAN PATCH (Right)  Patient Location: PACU  Anesthesia Type:General and Regional  Level of Consciousness: awake, alert  and patient cooperative  Airway & Oxygen Therapy: Patient Spontanous Breathing and Patient connected to face mask oxygen  Post-op Assessment: Report given to RN, Post -op Vital signs reviewed and stable, Patient moving all extremities X 4 and Patient able to stick tongue midline  Post vital signs: Reviewed and stable  Last Vitals:  Vitals:   01/29/17 0833  BP: 121/72  Pulse: 89  Resp: 20  Temp: 36.5 C    Last Pain:  Vitals:   01/29/17 0833  TempSrc: Oral      Patients Stated Pain Goal: 5 (40/37/54 3606)  Complications: No apparent anesthesia complications

## 2017-01-30 NOTE — Anesthesia Postprocedure Evaluation (Signed)
Anesthesia Post Note  Patient: Blake [name]  Procedure(s) Performed: Procedure(s) (LRB): RIGHT SHOULDER ARTHROSCOPY WITH SUBACROMIAL DECOMPRESSION AND DISTAL CLAVICLE EXCISION, MINI OPEN ROTATOR CUFF REPAIR, DERMASPAN PATCH (Right)  Patient location during evaluation: PACU Anesthesia Type: Regional and General Level of consciousness: awake Pain management: pain level controlled Vital Signs Assessment: post-procedure vital signs reviewed and stable Respiratory status: spontaneous breathing Cardiovascular status: stable Anesthetic complications: no       Last Vitals:  Vitals:   01/29/17 1445 01/29/17 1500  BP:  118/76  Pulse: 64 82  Resp: 15 14  Temp: 36.7 C     Last Pain:  Vitals:   01/29/17 1345  TempSrc:   PainSc: 0-No pain                 Anasia Agro

## 2017-01-30 NOTE — Addendum Note (Signed)
Addendum  created 01/30/17 1749 by Belinda Block, MD   Sign clinical note

## 2017-01-30 NOTE — Op Note (Signed)
NAME:  Blake, [name]                   ACCOUNT NO.:  MEDICAL RECORD NO.:  [id number]  LOCATION:                                 FACILITY:  PHYSICIAN:  Vonna Kotyk. Durward Fortes, M.D.    DATE OF BIRTH:  DATE OF PROCEDURE:  01/29/2017 DATE OF DISCHARGE:                              OPERATIVE REPORT   PREOPERATIVE DIAGNOSES: 1. Impingement syndrome, right shoulder with rotator cuff tear. 2. Degenerative arthrosis, acromioclavicular joint.  POSTOPERATIVE DIAGNOSES: 1. Impingement syndrome, right shoulder with rotator cuff tear. 2. Degenerative arthrosis, acromioclavicular joint. 3. Tear of biceps tendon.  PROCEDURE: 1. Diagnostic arthroscopy right shoulder with synovectomy and release     of biceps tendon. 2. Arthroscopic subacromial decompression. 3. Arthroscopic distal clavicle resection. 4. Mini open rotator cuff tear repair.  SURGEON:  Vonna Kotyk. Durward Fortes, M.D.  ASSISTANT:  Biagio Borg, PA-C.  ANESTHESIA:  General with supplemental interscalene nerve block.  COMPLICATIONS:  None.  HISTORY:  A 71 year old gentleman, who has had progressive problems with his right shoulder including pain and difficulty raising his arm over his head.  He has had a recent MRI scan demonstrating a complete tear of the supra and infraspinatus tendons with 3.7 cm of retraction.  There was severe tendinosis of the leading edge of the infraspinatus tendon and severe edema in the infraspinatus muscle, teres minor and subscapularis tendons were intact.  Long head of the biceps appeared to be intact.  There was no significant atrophy of the rotator cuff muscles.  No evidence of loose body formation.  There was mild chondral thinning of the glenohumeral joint.  The patient now to have an arthroscopic debridement and rotator cuff tear repair.  DESCRIPTION OF PROCEDURE:  Blake [name] was met in the holding area, identified the right shoulder as appropriate operative site and marked it accordingly.   Anesthesia performed interscalene nerve block.  The patient was then transported to room #7.  He was carefully transferred to the operating table and placed under general anesthesia using the GlideScope.  The patient was then placed in the semi-sitting position with a shoulder frame without problems.  Examination of the shoulder revealed mild adhesive capsulitis. Manipulation was performed.  The right shoulder was then prepped with DuraPrep from the base of the neck circumferentially to the wrist. Sterile draping was performed.  Time-out was called.  A marking pen was used to outline the [facility] joint, the coracoid, and the acromion at a point of fingerbreadth posterior and medial to the posterior angle acromion, a small stab wound was made.  The arthroscope was easily placed in the shoulder joint.  There was a hemarthrosis from the manipulation.  This was evacuated using a second cannula anteriorly.  There was a moderate amount of beefy red synovitis.  I performed a synovectomy.  The biceps tendon was severely degenerative and torn, so this was released from its attachment to the superior glenoid.  There was some mild chondromalacia identified by the MRI scan, probably representing grade 2 changes.  There were no loose bodies.  The labrum appeared to be intact.  There was an obvious tear of the rotator cuff with retraction.  The arthroscope was  then placed in subacromial space posteriorly, the cannula subacromial space anteriorly, and third portal established in the lateral subacromial space.  There was a moderate amount of beefy red bursal tissue.  This was debrided with the ArthroCare wand.  There were obvious anterior and lateral overhang of the acromion.  A 6 mm hooded bur was used to perform an anterior and lateral acromioplasty with a nice flat resection.  There were obvious degenerative changes with synovitis at the [facility] joint.  Distal clavicle resection was performed with the  same 6 mm bur.  Rotator cuff tear was easily visualized through the arthroscope.  Mini open rotator cuff tear repair was then performed about an inch and a half incision was made over the anterior aspect of the shoulder at the interval between the infra and supraspinatus tendons.  Via blunt and sharp dissection, incision was carried down to the subcutaneous tissue.  A rhaphe in the deltoid fascia was identified and incised.  The subacromial space was entered.  There still was some bursal tissue that I resected.  There was considerable retraction of both the infra and supraspinatus, manually released a lot of the adhesions.  There was a U-shaped tear in the tendon with considerable retraction posteriorly of the infraspinatus.  I used a series of 3 PEEK 5.5 mm anchors with attached #2 FiberWire to secure the cuff to the anterior aspect of the humeral head.  I was able to cover almost all of the humeral head and all of the articular cartilage.  I used a single Quattro anchor to further secure the cuff against the bleeding bone.  As I placed the arm through the range of motion, there was no obvious tension across the repair site.  This represented a near irreparable tear of the cuff and hopefully, this will be effective in not only pain relief, but more a stronger range of motion.  We will plan to perform this as an outpatient with oxycodone for pain, the sling, and plan to see him back next week.     Vonna Kotyk. Durward Fortes, M.D.     PWW/MEDQ  D:  01/29/2017  T:  01/29/2017  Job:  [id number]

## 2017-02-01 ENCOUNTER — Telehealth (INDEPENDENT_AMBULATORY_CARE_PROVIDER_SITE_OTHER): Payer: Self-pay | Admitting: *Deleted

## 2017-02-01 NOTE — Telephone Encounter (Signed)
Please advise 

## 2017-02-01 NOTE — Telephone Encounter (Signed)
called

## 2017-02-01 NOTE — Telephone Encounter (Signed)
Pt called stating he is having swelling in his feet and calves. Pt had surgery on 4/10. CB: [phone]

## 2017-02-04 ENCOUNTER — Other Ambulatory Visit (INDEPENDENT_AMBULATORY_CARE_PROVIDER_SITE_OTHER): Payer: Self-pay | Admitting: *Deleted

## 2017-02-04 ENCOUNTER — Encounter (INDEPENDENT_AMBULATORY_CARE_PROVIDER_SITE_OTHER): Payer: Self-pay | Admitting: Orthopaedic Surgery

## 2017-02-04 ENCOUNTER — Ambulatory Visit (INDEPENDENT_AMBULATORY_CARE_PROVIDER_SITE_OTHER): Payer: Medicare Other | Admitting: Orthopaedic Surgery

## 2017-02-04 VITALS — BP 102/62 | HR 85 | Ht 69.0 in | Wt 200.0 lb

## 2017-02-04 DIAGNOSIS — M25511 Pain in right shoulder: Principal | ICD-10-CM

## 2017-02-04 DIAGNOSIS — G8929 Other chronic pain: Secondary | ICD-10-CM

## 2017-02-04 MED ORDER — TRAMADOL HCL 50 MG PO TABS: 50.0000 mg | ORAL_TABLET | Freq: Two times a day (BID) | ORAL | 0 refills | Status: DC | PRN

## 2017-02-04 NOTE — Progress Notes (Signed)
Office Visit Note   Patient: Blake [name]           Date of Birth: 08/16/1946           MRN: [id number] Visit Date: 02/04/2017              Requested by: Janith Lima, MD [address] [place], [place] [address] PCP: Scarlette Calico, MD   Assessment & Plan: Visit Diagnoses:  1. Chronic right shoulder pain   6 days s/p RCT repair Plan:Try tramadol for pain, continue with sling, follow up 2 weeks, begin physical therapy for passive range of motion for total of 6 weeks Follow-Up Instructions: Return in about 2 weeks (around 02/18/2017).   Orders:  No orders of the defined types were placed in this encounter.  No orders of the defined types were placed in this encounter.     Procedures: No procedures performed   Clinical Data: No additional findings.   Subjective: Chief Complaint  Patient presents with  . Right Shoulder - Routine Post Op    Mr. Hilyard is a 71 year old male 6 days status post  rotator cuff tear or rupture of right shoulder, not specified as traumatic  Pt has to sit in recliner to sleep    Mr. Byas relates she's feeling much better. He is taking Tylenol for pain as he is unable to take NSAIDs relating to his "heart". Has had some swelling of his ankles and feet but not to either upper extremity. He's not had any fever or chills.  HPI  Review of Systems   Objective: Vital Signs: BP 102/62   Pulse 85   Ht 5\' 9"  (1.753 m)   Wt 200 lb (90.7 kg)   BMI 29.53 kg/m   Physical Exam  Ortho Exam right shoulder dressing was removed. The incisions look fine. There were cleaned and new Steri-Strips applied. Neurovascular exam is intact.     Imaging: No results found.   PMFS History: Patient Active Problem List   Diagnosis Date Noted  . Unspecified rotator cuff tear or rupture of right shoulder, not specified as traumatic 01/29/2017  . AC (acromioclavicular) arthritis 01/29/2017  . Tendonitis of upper biceps tendon of right shoulder  01/29/2017  . Pain in joint of right shoulder 10/09/2016  . Primary osteoarthritis of right shoulder 10/09/2016  . Spinal stenosis of lumbar region at multiple levels 12/01/2015  . Routine general medical examination at a health care facility 11/08/2015  . Obesity (BMI 30-39.9) 10/28/2012  . BPH (benign prostatic hyperplasia) 06/08/2011  . Gastric ulcer 06/08/2011  . Coronary artery disease 05/03/2011  . Atrial fibrillation ([place]) 04/05/2011  . Hyperlipidemia 05/25/2009  . BINGE EATING DISORDER 05/25/2009   Past Medical History:  Diagnosis Date  . Angina    none since CABG  . Arthritis   . Bladder spasm    takes Levsin as needed  . CAD (coronary artery disease)   . Diverticulosis   . Duodenal ulcer    hx of  . Dysrhythmia    atrial fib post Cabg/ takes Eliquis daily as well as Metoprolol   . GERD (gastroesophageal reflux disease)    takes Protonix daily  . History of blood transfusion    not abnormal reaction  . History of colon polyps    benign  . History of kidney stones    hx of  . Hyperlipidemia    takes Atorvastatin daily  . HYPERLIPIDEMIA 05/25/2009  . Joint pain   .  Nocturia   . PAF (paroxysmal atrial fibrillation) ([facility])     Family History  Problem Relation Age of Onset  . Coronary artery disease Father   . Heart attack Father 30  . Cancer Father   . Lymphoma    . Diabetes Sister   . Heart attack Sister 79  . Ovarian cancer Sister   . Atrial fibrillation Brother   . Colon cancer Neg Hx   . Stomach cancer Neg Hx     Past Surgical History:  Procedure Laterality Date  . CARDIAC CATHETERIZATION     6/12  . COLONOSCOPY    . CORONARY ARTERY BYPASS GRAFT  03/27/2011   x3, Dr Tharon Aquas Trigt    . ESOPHAGOGASTRODUODENOSCOPY ENDOSCOPY  04/12/11   cauterization of bleeding ulcer  and artery in duodenum and stomach   . INGUINAL HERNIA REPAIR  11/06/2011   Procedure: HERNIA REPAIR INGUINAL ADULT;  Surgeon: Earnstine Regal, MD;  Location: [facility];  Service: General;   Laterality: N/A;  Repair left inguinal hernia with mesh  . WISDOM TOOTH EXTRACTION     Social History   Occupational History  . retired     Social History Main Topics  . Smoking status: Former Smoker    Packs/day: 1.00    Years: 25.00    Quit date: 03/04/1988  . Smokeless tobacco: Never Used  . Alcohol use 0.6 oz/week    1 Cans of beer per week     Comment: occ  . Drug use: No  . Sexual activity: Not on file

## 2017-02-06 ENCOUNTER — Ambulatory Visit: Payer: Medicare Other | Attending: Orthopaedic Surgery | Admitting: Physical Therapy

## 2017-02-06 ENCOUNTER — Telehealth: Payer: Self-pay | Admitting: Cardiology

## 2017-02-06 DIAGNOSIS — M6281 Muscle weakness (generalized): Secondary | ICD-10-CM | POA: Diagnosis not present

## 2017-02-06 DIAGNOSIS — M25511 Pain in right shoulder: Secondary | ICD-10-CM | POA: Diagnosis not present

## 2017-02-06 DIAGNOSIS — M25611 Stiffness of right shoulder, not elsewhere classified: Secondary | ICD-10-CM | POA: Insufficient documentation

## 2017-02-06 NOTE — Telephone Encounter (Signed)
Spoke with patients wife and patient had shoulder surgery last week and since surgery has noticed bilateral swelling in feet going up legs Per wife patient was seen in follow up on 4/16 by surgeon and was told swelling was not related to surgery contact cardiologist. Was seen at PT today and told to contact cardiologist for an appointment as soon as possible Wife does not think swelling goes down at night, denies any change in temperature or redness.  Patient has been drinking a lot of sodas, does not watch salt intake, and is currently sleeping in a recliner.  Advised wife to have patient decrease his salt and soda intake and try to elevate legs above the heart as often as possible.  Reviewed scheduled and nothing available at [facility]  or [facility] until Monday, scheduled appointment 02/11/17 with Serita Butcher PA Advised wife, verbalized understanding.   Will forward to Dr Stanford Breed for review

## 2017-02-06 NOTE — Therapy (Signed)
[place], [place], [address] Phone: [phone]   Fax:  [phone]  Physical Therapy Evaluation  Patient Details  Name: Blake [name] MRN: [id number] Date of Birth: Feb 03, 1946 Referring Provider: Garald Balding, MD  Encounter Date: 02/06/2017      PT End of Session - 02/06/17 1540    Visit Number 1   Number of Visits 25   Date for PT Re-Evaluation 05/03/17   Authorization Type U[employer] MCR, certified through 8/93,    PT 6 week re eval by 5/30   PT Start Time 1501   PT Stop Time 1548   PT Time Calculation (min) 47 min   Activity Tolerance Patient tolerated treatment well   Behavior During Therapy [facility] for tasks assessed/performed      Past Medical History:  Diagnosis Date  . Angina    none since CABG  . Arthritis   . Bladder spasm    takes Levsin as needed  . CAD (coronary artery disease)   . Diverticulosis   . Duodenal ulcer    hx of  . Dysrhythmia    atrial fib post Cabg/ takes Eliquis daily as well as Metoprolol   . GERD (gastroesophageal reflux disease)    takes Protonix daily  . History of blood transfusion    not abnormal reaction  . History of colon polyps    benign  . History of kidney stones    hx of  . Hyperlipidemia    takes Atorvastatin daily  . HYPERLIPIDEMIA 05/25/2009  . Joint pain   . Nocturia   . PAF (paroxysmal atrial fibrillation) ([place])     Past Surgical History:  Procedure Laterality Date  . CARDIAC CATHETERIZATION     6/12  . COLONOSCOPY    . CORONARY ARTERY BYPASS GRAFT  03/27/2011   x3, Dr Tharon Aquas Trigt    . ESOPHAGOGASTRODUODENOSCOPY ENDOSCOPY  04/12/11   cauterization of bleeding ulcer  and artery in duodenum and stomach   . INGUINAL HERNIA REPAIR  11/06/2011   Procedure: HERNIA REPAIR INGUINAL ADULT;  Surgeon: Earnstine Regal, MD;  Location: [facility];  Service: General;  Laterality: N/A;  Repair left inguinal hernia with mesh  . WISDOM TOOTH EXTRACTION       There were no vitals filed for this visit.       Subjective Assessment - 02/06/17 1509    Subjective Pt reports today is about the best day since surgery. Denies neck pain or HA. Bilateral lower leg swelling since surgery, stiffness in skin. (PT strongly recommended calling cardiologist).    Patient Stated Goals do everything I could before the shoulder pain, woodworking   Currently in Pain? Yes   Pain Score 2    Pain Location Shoulder   Pain Orientation Right   Pain Descriptors / Indicators Sore   Pain Type Surgical pain   Aggravating Factors  quick moving creates sharp moving   Pain Relieving Factors rest            [facility] PT Assessment - 02/06/17 0001      Assessment   Referring Provider Garald Balding, MD   Onset Date/Surgical Date 01/29/17   Hand Dominance Left   Next MD Visit 02/25/17   Prior Therapy no     Precautions   Precautions Shoulder   Type of Shoulder Precautions mini open RCR     Restrictions   Weight Bearing Restrictions No     Balance Screen  Has the patient fallen in the past 6 months Yes   How many times? 1   Has the patient had a decrease in activity level because of a fear of falling?  No   Is the patient reluctant to leave their home because of a fear of falling?  No     Home Ecologist residence   Living Arrangements Spouse/significant other   Additional Comments stairs at home     Prior Function   Level of Independence Independent with basic ADLs     Cognition   Overall Cognitive Status Within Functional Limits for tasks assessed     Observation/Other Assessments   Focus on Therapeutic Outcomes (FOTO)  29% ability (goal 68%)     Sensation   Additional Comments [facility]     Posture/Postural Control   Posture Comments slouching with sling     Palpation   Palpation comment mild TTP around GHJ                   [facility] Adult PT Treatment/Exercise - 02/06/17 0001      Therapeutic  Activites    Therapeutic Activities Other Therapeutic Activities   Other Therapeutic Activities shoulder alignment with pillows while sleeping, move arm for hygiene     Exercises   Exercises Shoulder     Shoulder Exercises: Seated   Retraction Limitations retraction for postural alignment     Shoulder Exercises: Standing   Other Standing Exercises pendulums     Modalities   Modalities Cryotherapy     Cryotherapy   Number Minutes Cryotherapy 10 Minutes  3 min with POC education   Cryotherapy Location Shoulder  R   Type of Cryotherapy Ice pack                PT Education - 02/06/17 1555    Education provided Yes   Education Details anatomy of condition, POC, HEP, exercise form/rationale, posture-resting and sleeping, call cardiologist regarding swelling in legs, showering   Person(s) Educated Patient   Methods Explanation;Demonstration;Tactile cues;Verbal cues;Handout   Comprehension Verbalized understanding;Returned demonstration;Verbal cues required;Tactile cues required;Need further instruction          PT Short Term Goals - 02/06/17 1605      PT SHORT TERM GOAL #1   Title PROM within 10 deg of L upper extremity by 5/18   Baseline see flowsheet   Time 4   Period Weeks   Status New     PT SHORT TERM GOAL #2   Title Pt will be independent with HEP as it has progressed on 5/18   Baseline will continue to educate and progress as appropraite   Time 4   Period Weeks   Status New           PT Long Term Goals - 02/06/17 1606      PT LONG TERM GOAL #1   Title Pt will be able to use L UE to reach overhead and grab dishes from overhead cabinets without increase in pain by 7/13   Baseline unable at eval   Time 12   Period Weeks   Status New     PT LONG TERM GOAL #2   Title Pt will be able to return to light activities of wood working with minimal limitation by shoulder to return to PLOF.    Baseline unable at eval   Time 12   Period Weeks   Status  New     PT LONG  TERM GOAL #3   Title AROM within 15 deg of R upper extremity to improve functional use of arm   Baseline unable to perform AROM at eval   Time 12   Period Weeks   Status New     PT LONG TERM GOAL #4   Title GHJ MMT grossly 4+/5 to indicate proper support to GHJ by surrounding soft tissue.    Baseline unable to MMT at eval   Time 12   Period Weeks   Status New     PT LONG TERM GOAL #5   Title FOTO to 68% ability to indicate significant improvement in functional ability   Baseline 29% ability at eval   Time 12   Period Weeks   Status New               Plan - 02/06/17 1600    Clinical Impression Statement Pt presents to PT with complaints of limited functional use of R upper extremity with minimal pain following mini-open RCR with SAD and distal clavicle excision on 01/29/17. Pt with swelling in arm and hand as expected and was asked to purchase play dough or silly putty to activate musculature. Educated on importance of allowing healing and following post-surgical protocol. pt was asked to contact cardiologist regarding swelling in lower legs that began since surgery. Will obtain PROM measures at next visit due to amount of time spent on education today. PROM per MD 6 weeks post op. Pt will benefit from skilled PT in order to progress appropriately through post-op protocol and reach functional long term goals.    Rehab Potential Good   PT Frequency 2x / week   PT Duration 12 weeks   PT Treatment/Interventions ADLs/Self Care Home Management;Cryotherapy;Electrical Stimulation;Functional mobility training;Traction;Moist Heat;Therapeutic activities;Therapeutic exercise;Neuromuscular re-education;Patient/family education;Passive range of motion;Scar mobilization;Manual techniques;Taping;Dry needling   PT Next Visit Plan PROM through 5/22 per MD, pendulums, hand/forearm work   PT Home Exercise Plan scapular retraction, putty work, ice, resting on pillow   Consulted and  Agree with Plan of Care Patient;Family member/caregiver   Family Member Consulted Wife      Patient will benefit from skilled therapeutic intervention in order to improve the following deficits and impairments:  Decreased range of motion, Impaired UE functional use, Increased muscle spasms, Decreased activity tolerance, Pain, Improper body mechanics, Impaired flexibility, Decreased scar mobility, Decreased mobility, Decreased strength, Postural dysfunction  Visit Diagnosis: Acute pain of right shoulder - Plan: PT plan of care cert/re-cert  Muscle weakness (generalized) - Plan: PT plan of care cert/re-cert  Stiffness of right shoulder, not elsewhere classified - Plan: PT plan of care cert/re-cert      G-Codes - 35/57/32 1611    Functional Assessment Tool Used (Outpatient Only) FOTO 29% ability (goal 68%), clinical judgement   Functional Limitation Carrying, moving and handling objects   Carrying, Moving and Handling Objects Current Status (K0254) At least 60 percent but less than 80 percent impaired, limited or restricted   Carrying, Moving and Handling Objects Goal Status (Y7062) At least 20 percent but less than 40 percent impaired, limited or restricted       Problem List Patient Active Problem List   Diagnosis Date Noted  . Unspecified rotator cuff tear or rupture of right shoulder, not specified as traumatic 01/29/2017  . AC (acromioclavicular) arthritis 01/29/2017  . Tendonitis of upper biceps tendon of right shoulder 01/29/2017  . Pain in joint of right shoulder 10/09/2016  . Primary osteoarthritis of right shoulder 10/09/2016  .  Spinal stenosis of lumbar region at multiple levels 12/01/2015  . Routine general medical examination at a health care facility 11/08/2015  . Obesity (BMI 30-39.9) 10/28/2012  . BPH (benign prostatic hyperplasia) 06/08/2011  . Gastric ulcer 06/08/2011  . Coronary artery disease 05/03/2011  . Atrial fibrillation ([place]) 04/05/2011  . Hyperlipidemia  05/25/2009  . BINGE EATING DISORDER 05/25/2009   Ronnel Zuercher C. Rochelle Nephew PT, DPT 02/06/17 4:15 PM   [facility] [address] [place], [place], [address] Phone: [phone]   Fax:  [phone]  Name: CATHY ROPP MRN: [id number] Date of Birth: 08-Mar-1946

## 2017-02-06 NOTE — Telephone Encounter (Signed)
Agree with fu ov with pa Kirk Ruths

## 2017-02-08 NOTE — Progress Notes (Signed)
Cardiology Office Note   Date:  02/11/2017   ID:  [name], [name] 06/04/1946, MRN [id number]  PCP:  Scarlette Calico, MD  Cardiologist:  Dr. Stanford Breed     Chief Complaint  Patient presents with  . Leg Swelling    pt c/o swelling in legs and feet and arms and SOB and very tired all the time       History of Present Illness: Blake [name] is a 71 y.o. male who presents for CAD and atrial fib.  Hx of CAD with CABG in 2012 with LIMA to LAD, VG to 1st diag, and VG to acute marginal.  She did have post op a fib.    Last echo EF 60%, mild LVH.  Trivial late systolic MR.  LA was moderately to severely dilated.  HLD, HTN, and remains in a fib on apixaban.   Pt recently underwent Rt shoulder rotator cuff repair and has developed lower ext edema.  He does admit to eating salt, sitting up with legs down even to sleep and he has chronic a fib.  His wife tells me his legs are down some from a few days ago.  Today his BP is 88 systolic but looking back in Nov BP was 98 systolic.  Also from [place] his wt is now up 10 lbs from that time.  He also has some SOB with exertion. No chest pain.  He was off Eliquis about a day and half for surgery and has been taking since..      Past Medical History:  Diagnosis Date  . Angina    none since CABG  . Arthritis   . Bladder spasm    takes Levsin as needed  . CAD (coronary artery disease)   . Diverticulosis   . Duodenal ulcer    hx of  . Dysrhythmia    atrial fib post Cabg/ takes Eliquis daily as well as Metoprolol   . GERD (gastroesophageal reflux disease)    takes Protonix daily  . History of blood transfusion    not abnormal reaction  . History of colon polyps    benign  . History of kidney stones    hx of  . Hyperlipidemia    takes Atorvastatin daily  . HYPERLIPIDEMIA 05/25/2009  . Joint pain   . Nocturia   . PAF (paroxysmal atrial fibrillation) ([place])     Past Surgical History:  Procedure Laterality Date  . CARDIAC  CATHETERIZATION     6/12  . COLONOSCOPY    . CORONARY ARTERY BYPASS GRAFT  03/27/2011   x3, Dr Tharon Aquas Trigt    . ESOPHAGOGASTRODUODENOSCOPY ENDOSCOPY  04/12/11   cauterization of bleeding ulcer  and artery in duodenum and stomach   . INGUINAL HERNIA REPAIR  11/06/2011   Procedure: HERNIA REPAIR INGUINAL ADULT;  Surgeon: Earnstine Regal, MD;  Location: [facility];  Service: General;  Laterality: N/A;  Repair left inguinal hernia with mesh  . WISDOM TOOTH EXTRACTION       Current Outpatient Prescriptions  Medication Sig Dispense Refill  . alfuzosin (UROXATRAL) 10 MG 24 hr tablet Take 10 mg by mouth daily.     [name] apixaban (ELIQUIS) 5 MG TABS tablet Take 1 tablet (5 mg total) by mouth 2 (two) times daily. 180 tablet 3  . atorvastatin (LIPITOR) 80 MG tablet Take 80 mg by mouth every evening.    . hyoscyamine (LEVSIN) 0.125 MG/ML solution Take 0.125 mg by mouth every 4 (four) hours as  needed for bladder spasms.    . metoprolol (LOPRESSOR) 25 MG tablet Take 1 tablet (25 mg total) by mouth 2 (two) times daily.    . pantoprazole (PROTONIX) 40 MG tablet Take 1 tablet (40 mg total) by mouth daily. 90 tablet 3  . traMADol (ULTRAM) 50 MG tablet Take 1 tablet (50 mg total) by mouth 2 (two) times daily between meals as needed. 30 tablet 0  . furosemide (LASIX) 40 MG tablet Take 1 tablet (40 mg total) by mouth daily. 35 tablet 1  . potassium chloride SA (K-DUR,KLOR-CON) 20 MEQ tablet Take 1 tablet (20 mEq total) by mouth daily. 35 tablet 1   No current facility-administered medications for this visit.     Allergies:   Patient has no known allergies.    Social History:  The patient  reports that he quit smoking about 28 years ago. He has a 25.00 pack-year smoking history. He has never used smokeless tobacco. He reports that he drinks about 0.6 oz of alcohol per week . He reports that he does not use drugs.   Family History:  The patient's family history includes Atrial fibrillation in his brother; Cancer  in his father; Coronary artery disease in his father; Diabetes in his sister; Heart attack (age of onset: 75) in his sister; Heart attack (age of onset: 16) in his father; Ovarian cancer in his sister.    ROS:  General:no colds or fevers, + weight increase Skin:+ petechia rash of feet with edema no ulcers HEENT:no blurred vision, no congestion CV:see HPI PUL:see HPI GI:no diarrhea constipation or melena, no indigestion GU:no hematuria, no dysuria MS:+ Rt shoulder pain, no claudication Neuro:no syncope, no lightheadedness Endo:no diabetes, no thyroid disease  Wt Readings from Last 3 Encounters:  02/11/17 202 lb 12.8 oz (92 kg)  02/04/17 200 lb (90.7 kg)  01/29/17 200 lb (90.7 kg)     PHYSICAL EXAM: VS:  BP (!) 88/62   Pulse 82   Ht 5\' 9"  (1.753 m)   Wt 202 lb 12.8 oz (92 kg)   BMI 29.95 kg/m  , BMI Body mass index is 29.95 kg/m. General:Pleasant affect, NAD Skin:Warm and dry, brisk capillary refill, both feet with petechial rashes HEENT:normocephalic, sclera clear, mucus membranes moist Neck:supple, no JVD, no bruits  Heart:irreg irreg without murmur, gallup, rub or click Lungs:clear without rales, rhonchi, or wheezes HYI:FOYDX, soft, non tender, + BS, do not palpate liver spleen or masses Ext:2+ lower ext edema up to thighs most at feet., 2+ pedal pulses, 2+ radial pulses, rt leg larger than left Neuro:alert and oriented X 3, MAE, follows commands, + facial symmetry    EKG:  EKG is ordered today. The ekg ordered today demonstrates a fib rate controlled and no acute changes from previous   Recent Labs: 01/23/2017: ALT 19; BUN 12; Creatinine, Ser 0.77; Hemoglobin 14.0; Platelets 227; Potassium 4.1; Sodium 137    Lipid Panel    Component Value Date/Time   CHOL 98 11/07/2015 1643   TRIG 58.0 11/07/2015 1643   HDL 33.90 (L) 11/07/2015 1643   CHOLHDL 3 11/07/2015 1643   VLDL 11.6 11/07/2015 1643   LDLCALC 52 11/07/2015 1643       Other studies  Reviewed: Additional studies/ records that were reviewed today include: . ECHO:  2014 Study Conclusions  - Left ventricle: The cavity size was normal. Wall thickness was increased in a pattern of mild LVH. The estimated ejection fraction was 60%. Wall motion was normal; there were no regional  wall motion abnormalities. - Mitral valve: Flat closure. Question slight posterior prolapse (with trivial late systolic MR). - Left atrium: The atrium was moderately to severely dilated. - Right ventricle: The cavity size was normal. Systolic function was mildly reduced  ASSESSMENT AND PLAN:  1. Lower ext edema- no chest pain but + SOB with edema- post op pt has had salty foods and has had his feet down even at night with sleep due to needing to sit up - recovering from Rt rotator cuff surgery.  Discussed with Dr. Ellyn Hack  DOD and with lower BP will decrease the BB to 25 BID and add lasix 40 mg BID for 3 days then 40 mg daily.  Will check labs today and I have added kdur 20 meq with each lasix.    I will see him back Friday at [address] - if no improvement he would need further testing and he would cancel trip to [facility].  If stable he can go.  -will check Echo when he returns. - he will follow up with Dr. Stanford Breed post echo.  2. Rt leg larger than left,  Will check venous doppler for DVT - he was off eliquis for surgery.   3. Hypotension he actually runs low looking back at BP.  If he becomes lightheaded or dizzy would decrease lasix.   4. CAD with hx CABG no chest pain.    Current medicines are reviewed with the patient today.  The patient Has no concerns regarding medicines.  The following changes have been made:  See above Labs/ tests ordered today include:see above  Disposition:   FU:  see above  Signed, Cecilie Kicks, NP  02/11/2017 5:49 PM    [place], [place], [place],  [place] Phone: [phone]; Fax: [phone]

## 2017-02-11 ENCOUNTER — Encounter: Payer: Self-pay | Admitting: Cardiology

## 2017-02-11 ENCOUNTER — Ambulatory Visit (INDEPENDENT_AMBULATORY_CARE_PROVIDER_SITE_OTHER): Payer: Medicare Other | Admitting: Cardiology

## 2017-02-11 VITALS — BP 88/62 | HR 82 | Ht 69.0 in | Wt 202.8 lb

## 2017-02-11 DIAGNOSIS — I959 Hypotension, unspecified: Secondary | ICD-10-CM

## 2017-02-11 DIAGNOSIS — I482 Chronic atrial fibrillation: Secondary | ICD-10-CM

## 2017-02-11 DIAGNOSIS — I2510 Atherosclerotic heart disease of native coronary artery without angina pectoris: Secondary | ICD-10-CM

## 2017-02-11 DIAGNOSIS — I4821 Permanent atrial fibrillation: Secondary | ICD-10-CM

## 2017-02-11 DIAGNOSIS — R609 Edema, unspecified: Secondary | ICD-10-CM

## 2017-02-11 DIAGNOSIS — R0602 Shortness of breath: Secondary | ICD-10-CM | POA: Diagnosis not present

## 2017-02-11 LAB — CBC WITH DIFFERENTIAL/PLATELET
BASOS ABS: 98 {cells}/uL (ref 0–200)
Basophils Relative: 1 %
EOS ABS: 196 {cells}/uL (ref 15–500)
EOS PCT: 2 %
HCT: 36.5 % — ABNORMAL LOW (ref 38.5–50.0)
Hemoglobin: 12.1 g/dL — ABNORMAL LOW (ref 13.2–17.1)
LYMPHS PCT: 20 %
Lymphs Abs: 1960 cells/uL (ref 850–3900)
MCH: 31.7 pg (ref 27.0–33.0)
MCHC: 33.2 g/dL (ref 32.0–36.0)
MCV: 95.5 fL (ref 80.0–100.0)
MPV: 9.2 fL (ref 7.5–12.5)
Monocytes Absolute: 1176 cells/uL — ABNORMAL HIGH (ref 200–950)
Monocytes Relative: 12 %
NEUTROS PCT: 65 %
Neutro Abs: 6370 cells/uL (ref 1500–7800)
Platelets: 306 10*3/uL (ref 140–400)
RBC: 3.82 MIL/uL — ABNORMAL LOW (ref 4.20–5.80)
RDW: 13.4 % (ref 11.0–15.0)
WBC: 9.8 10*3/uL (ref 3.8–10.8)

## 2017-02-11 LAB — BASIC METABOLIC PANEL
BUN: 19 mg/dL (ref 7–25)
CALCIUM: 9.4 mg/dL (ref 8.6–10.3)
CO2: 27 mmol/L (ref 20–31)
Chloride: 105 mmol/L (ref 98–110)
Creat: 0.92 mg/dL (ref 0.70–1.18)
Glucose, Bld: 91 mg/dL (ref 65–99)
Potassium: 4.6 mmol/L (ref 3.5–5.3)
SODIUM: 140 mmol/L (ref 135–146)

## 2017-02-11 MED ORDER — METOPROLOL TARTRATE 25 MG PO TABS: 25.0000 mg | ORAL_TABLET | Freq: Two times a day (BID) | ORAL | Status: DC

## 2017-02-11 MED ORDER — POTASSIUM CHLORIDE CRYS ER 20 MEQ PO TBCR: 20.0000 meq | EXTENDED_RELEASE_TABLET | Freq: Every day | ORAL | 1 refills | Status: DC

## 2017-02-11 MED ORDER — FUROSEMIDE 40 MG PO TABS: 40.0000 mg | ORAL_TABLET | Freq: Every day | ORAL | 1 refills | Status: DC

## 2017-02-11 NOTE — Patient Instructions (Signed)
Medication Instructions:  Your physician has recommended you make the following change in your medication:  1) REDUCE Metoprolol to 25mg  (1/2 tablet) twice daily 2) START Lasix 40mg  twice daily for 3 days then REDUCE to 40mg  daily. An Rx has been sent to your pharmacy 3) START Potassium K-Dur 20 mEq twice daily for 3 days then REDUCE to 20 mEq daily. An Rx has been sent to your pharmacy   Labwork: Bmet, Bnp, Cbc today. Please have your labs drawn downstairs at [facility] labs  Testing/Procedures: Your physician has requested that you have a lower extremity venous duplex. During this test, ultrasound are used to evaluate venous blood flow in the legs. Allow one hour for this exam. There are no restrictions or special instructions. (appt 02/12/17 @ 9:30am)  Your physician has requested that you have an echocardiogram. Echocardiography is a painless test that uses sound waves to create images of your heart. It provides your doctor with information about the size and shape of your heart and how well your heart's chambers and valves are working. This procedure takes approximately one hour. There are no restrictions for this procedure. ( To be scheduled in 2 weeks)   Follow-Up: Your physician recommends that you schedule a follow-up appointment in: 1 week with Cecilie Kicks, NP   Any Other Special Instructions Will Be Listed Below (If Applicable). Please call the office if you develop light headiness or dizziness    If you need a refill on your cardiac medications before your next appointment, please call your pharmacy.

## 2017-02-12 ENCOUNTER — Other Ambulatory Visit: Payer: Self-pay | Admitting: Physician Assistant

## 2017-02-12 ENCOUNTER — Emergency Department (HOSPITAL_COMMUNITY): Payer: Medicare Other

## 2017-02-12 ENCOUNTER — Observation Stay (HOSPITAL_COMMUNITY)
Admission: EM | Admit: 2017-02-12 | Discharge: 2017-02-13 | Disposition: A | Discharge status: Home / Self Care | Payer: Medicare Other | Attending: Internal Medicine | Admitting: Internal Medicine

## 2017-02-12 ENCOUNTER — Encounter (HOSPITAL_COMMUNITY): Payer: Self-pay | Admitting: Emergency Medicine

## 2017-02-12 ENCOUNTER — Observation Stay (HOSPITAL_BASED_OUTPATIENT_CLINIC_OR_DEPARTMENT_OTHER): Payer: Medicare Other

## 2017-02-12 ENCOUNTER — Ambulatory Visit (HOSPITAL_COMMUNITY): Payer: Medicare Other

## 2017-02-12 ENCOUNTER — Observation Stay (HOSPITAL_COMMUNITY): Payer: Medicare Other

## 2017-02-12 DIAGNOSIS — R0602 Shortness of breath: Secondary | ICD-10-CM | POA: Diagnosis not present

## 2017-02-12 DIAGNOSIS — K219 Gastro-esophageal reflux disease without esophagitis: Secondary | ICD-10-CM | POA: Insufficient documentation

## 2017-02-12 DIAGNOSIS — J181 Lobar pneumonia, unspecified organism: Secondary | ICD-10-CM

## 2017-02-12 DIAGNOSIS — I501 Left ventricular failure: Secondary | ICD-10-CM | POA: Diagnosis not present

## 2017-02-12 DIAGNOSIS — Z87442 Personal history of urinary calculi: Secondary | ICD-10-CM | POA: Diagnosis not present

## 2017-02-12 DIAGNOSIS — I5031 Acute diastolic (congestive) heart failure: Secondary | ICD-10-CM

## 2017-02-12 DIAGNOSIS — Z8249 Family history of ischemic heart disease and other diseases of the circulatory system: Secondary | ICD-10-CM | POA: Insufficient documentation

## 2017-02-12 DIAGNOSIS — E785 Hyperlipidemia, unspecified: Secondary | ICD-10-CM | POA: Diagnosis not present

## 2017-02-12 DIAGNOSIS — Z7901 Long term (current) use of anticoagulants: Secondary | ICD-10-CM | POA: Insufficient documentation

## 2017-02-12 DIAGNOSIS — Z951 Presence of aortocoronary bypass graft: Secondary | ICD-10-CM | POA: Diagnosis not present

## 2017-02-12 DIAGNOSIS — I2583 Coronary atherosclerosis due to lipid rich plaque: Secondary | ICD-10-CM | POA: Diagnosis not present

## 2017-02-12 DIAGNOSIS — I5033 Acute on chronic diastolic (congestive) heart failure: Secondary | ICD-10-CM | POA: Diagnosis not present

## 2017-02-12 DIAGNOSIS — I2510 Atherosclerotic heart disease of native coronary artery without angina pectoris: Secondary | ICD-10-CM

## 2017-02-12 DIAGNOSIS — I482 Chronic atrial fibrillation, unspecified: Secondary | ICD-10-CM | POA: Diagnosis present

## 2017-02-12 DIAGNOSIS — I110 Hypertensive heart disease with heart failure: Secondary | ICD-10-CM | POA: Insufficient documentation

## 2017-02-12 DIAGNOSIS — M7989 Other specified soft tissue disorders: Secondary | ICD-10-CM | POA: Diagnosis not present

## 2017-02-12 DIAGNOSIS — Z87891 Personal history of nicotine dependence: Secondary | ICD-10-CM | POA: Insufficient documentation

## 2017-02-12 DIAGNOSIS — J189 Pneumonia, unspecified organism: Secondary | ICD-10-CM | POA: Diagnosis not present

## 2017-02-12 DIAGNOSIS — R0600 Dyspnea, unspecified: Secondary | ICD-10-CM | POA: Diagnosis present

## 2017-02-12 DIAGNOSIS — Z86010 Personal history of colonic polyps: Secondary | ICD-10-CM | POA: Insufficient documentation

## 2017-02-12 DIAGNOSIS — Z79899 Other long term (current) drug therapy: Secondary | ICD-10-CM | POA: Insufficient documentation

## 2017-02-12 DIAGNOSIS — I340 Nonrheumatic mitral (valve) insufficiency: Secondary | ICD-10-CM

## 2017-02-12 DIAGNOSIS — R069 Unspecified abnormalities of breathing: Secondary | ICD-10-CM | POA: Diagnosis not present

## 2017-02-12 DIAGNOSIS — I5032 Chronic diastolic (congestive) heart failure: Secondary | ICD-10-CM | POA: Diagnosis present

## 2017-02-12 LAB — BASIC METABOLIC PANEL
Anion gap: 12 (ref 5–15)
BUN: 17 mg/dL (ref 6–20)
CALCIUM: 9.3 mg/dL (ref 8.9–10.3)
CO2: 23 mmol/L (ref 22–32)
Chloride: 103 mmol/L (ref 101–111)
Creatinine, Ser: 0.96 mg/dL (ref 0.61–1.24)
GFR calc Af Amer: >60 mL/min (ref >60)
GLUCOSE: 90 mg/dL (ref 65–99)
Potassium: 3.6 mmol/L (ref 3.5–5.1)
Sodium: 138 mmol/L (ref 135–145)

## 2017-02-12 LAB — CBC WITH DIFFERENTIAL/PLATELET
BASOS ABS: 0.1 10*3/uL (ref 0.0–0.1)
Basophils Relative: 1 %
EOS PCT: 2 %
Eosinophils Absolute: 0.2 10*3/uL (ref 0.0–0.7)
HEMATOCRIT: 38.2 % — AB (ref 39.0–52.0)
Hemoglobin: 12.7 g/dL — ABNORMAL LOW (ref 13.0–17.0)
LYMPHS PCT: 30 %
Lymphs Abs: 3.3 10*3/uL (ref 0.7–4.0)
MCH: 31.6 pg (ref 26.0–34.0)
MCHC: 33.2 g/dL (ref 30.0–36.0)
MCV: 95 fL (ref 78.0–100.0)
MONO ABS: 1.5 10*3/uL — AB (ref 0.1–1.0)
MONOS PCT: 13 %
Neutro Abs: 6 10*3/uL (ref 1.7–7.7)
Neutrophils Relative %: 54 %
PLATELETS: 290 10*3/uL (ref 150–400)
RBC: 4.02 MIL/uL — ABNORMAL LOW (ref 4.22–5.81)
RDW: 13.1 % (ref 11.5–15.5)
WBC: 11 10*3/uL — ABNORMAL HIGH (ref 4.0–10.5)

## 2017-02-12 LAB — BRAIN NATRIURETIC PEPTIDE
B Natriuretic Peptide: 109.2 pg/mL — ABNORMAL HIGH (ref 0.0–100.0)
BRAIN NATRIURETIC PEPTIDE: 122.8 pg/mL — AB (ref <100)

## 2017-02-12 LAB — I-STAT CG4 LACTIC ACID, ED
LACTIC ACID, VENOUS: 2.35 mmol/L — AB (ref 0.5–1.9)
Lactic Acid, Venous: 1.18 mmol/L (ref 0.5–1.9)

## 2017-02-12 LAB — CBC
HCT: 36.1 % — ABNORMAL LOW (ref 39.0–52.0)
Hemoglobin: 12 g/dL — ABNORMAL LOW (ref 13.0–17.0)
MCH: 31.9 pg (ref 26.0–34.0)
MCHC: 33.2 g/dL (ref 30.0–36.0)
MCV: 96 fL (ref 78.0–100.0)
PLATELETS: 279 10*3/uL (ref 150–400)
RBC: 3.76 MIL/uL — ABNORMAL LOW (ref 4.22–5.81)
RDW: 13.3 % (ref 11.5–15.5)
WBC: 10.2 10*3/uL (ref 4.0–10.5)

## 2017-02-12 LAB — ECHOCARDIOGRAM COMPLETE
HEIGHTINCHES: 66 in
WEIGHTICAEL: 3144 [oz_av]

## 2017-02-12 LAB — MAGNESIUM: MAGNESIUM: 2.1 mg/dL (ref 1.7–2.4)

## 2017-02-12 LAB — STREP PNEUMONIAE URINARY ANTIGEN: Strep Pneumo Urinary Antigen: NEGATIVE

## 2017-02-12 LAB — PROCALCITONIN

## 2017-02-12 LAB — TROPONIN I: Troponin I: <0.03 ng/mL (ref <0.03)

## 2017-02-12 MED ORDER — DEXTROSE 5 % IV SOLN: 1.0000 g | INTRAVENOUS | Status: DC

## 2017-02-12 MED ORDER — ENSURE ENLIVE PO LIQD
237.0000 mL | Freq: Two times a day (BID) | ORAL | Status: DC
  Administered 2017-02-12 (×2): 237 mL via ORAL

## 2017-02-12 MED ORDER — ACETAMINOPHEN 650 MG RE SUPP: 650.0000 mg | Freq: Four times a day (QID) | RECTAL | Status: DC | PRN

## 2017-02-12 MED ORDER — METOPROLOL TARTRATE 25 MG PO TABS
25.0000 mg | ORAL_TABLET | Freq: Two times a day (BID) | ORAL | Status: DC
  Administered 2017-02-12 – 2017-02-13 (×3): 25 mg via ORAL
  Filled 2017-02-12 (×3): qty 1

## 2017-02-12 MED ORDER — FUROSEMIDE 40 MG PO TABS
40.0000 mg | ORAL_TABLET | Freq: Every day | ORAL | Status: DC
  Administered 2017-02-13: 40 mg via ORAL
  Filled 2017-02-12: qty 1

## 2017-02-12 MED ORDER — AZITHROMYCIN 250 MG PO TABS: 250.0000 mg | ORAL_TABLET | ORAL | Status: DC

## 2017-02-12 MED ORDER — POTASSIUM CHLORIDE CRYS ER 20 MEQ PO TBCR
40.0000 meq | EXTENDED_RELEASE_TABLET | Freq: Every day | ORAL | Status: DC
  Administered 2017-02-12 – 2017-02-13 (×2): 40 meq via ORAL
  Filled 2017-02-12 (×2): qty 2

## 2017-02-12 MED ORDER — METOPROLOL TARTRATE 12.5 MG HALF TABLET
12.5000 mg | ORAL_TABLET | Freq: Two times a day (BID) | ORAL | Status: DC
  Filled 2017-02-12: qty 1

## 2017-02-12 MED ORDER — DILTIAZEM HCL 25 MG/5ML IV SOLN
10.0000 mg | Freq: Once | INTRAVENOUS | Status: AC
  Administered 2017-02-12: 10 mg via INTRAVENOUS
  Filled 2017-02-12: qty 5

## 2017-02-12 MED ORDER — SODIUM CHLORIDE 0.9 % IV BOLUS (SEPSIS): 500.0000 mL | Freq: Once | INTRAVENOUS | Status: DC

## 2017-02-12 MED ORDER — FUROSEMIDE 10 MG/ML IJ SOLN
40.0000 mg | Freq: Two times a day (BID) | INTRAMUSCULAR | Status: AC
  Administered 2017-02-12: 40 mg via INTRAVENOUS
  Filled 2017-02-12: qty 4

## 2017-02-12 MED ORDER — DEXTROSE 5 % IV SOLN
1.0000 g | Freq: Once | INTRAVENOUS | Status: AC
  Administered 2017-02-12: 1 g via INTRAVENOUS
  Filled 2017-02-12: qty 10

## 2017-02-12 MED ORDER — ATORVASTATIN CALCIUM 80 MG PO TABS
80.0000 mg | ORAL_TABLET | Freq: Every evening | ORAL | Status: DC
  Administered 2017-02-12: 80 mg via ORAL
  Filled 2017-02-12: qty 1

## 2017-02-12 MED ORDER — FUROSEMIDE 10 MG/ML IJ SOLN: 40.0000 mg | Freq: Every day | INTRAMUSCULAR | Status: DC

## 2017-02-12 MED ORDER — PANTOPRAZOLE SODIUM 40 MG PO TBEC
40.0000 mg | DELAYED_RELEASE_TABLET | Freq: Every day | ORAL | Status: DC
  Administered 2017-02-12 – 2017-02-13 (×2): 40 mg via ORAL
  Filled 2017-02-12 (×2): qty 1

## 2017-02-12 MED ORDER — ONDANSETRON HCL 4 MG/2ML IJ SOLN: 4.0000 mg | Freq: Four times a day (QID) | INTRAMUSCULAR | Status: DC | PRN

## 2017-02-12 MED ORDER — ALFUZOSIN HCL ER 10 MG PO TB24
10.0000 mg | ORAL_TABLET | Freq: Every day | ORAL | Status: DC
  Administered 2017-02-12 – 2017-02-13 (×2): 10 mg via ORAL
  Filled 2017-02-12 (×2): qty 1

## 2017-02-12 MED ORDER — AZITHROMYCIN 250 MG PO TABS
500.0000 mg | ORAL_TABLET | Freq: Once | ORAL | Status: AC
  Administered 2017-02-12: 500 mg via ORAL
  Filled 2017-02-12: qty 2

## 2017-02-12 MED ORDER — FUROSEMIDE 10 MG/ML IJ SOLN
40.0000 mg | Freq: Two times a day (BID) | INTRAMUSCULAR | Status: DC
  Administered 2017-02-12: 40 mg via INTRAVENOUS
  Filled 2017-02-12: qty 4

## 2017-02-12 MED ORDER — ACETAMINOPHEN 325 MG PO TABS
650.0000 mg | ORAL_TABLET | Freq: Four times a day (QID) | ORAL | Status: DC | PRN
  Administered 2017-02-12 (×2): 650 mg via ORAL
  Filled 2017-02-12 (×3): qty 2

## 2017-02-12 MED ORDER — ONDANSETRON HCL 4 MG PO TABS: 4.0000 mg | ORAL_TABLET | Freq: Four times a day (QID) | ORAL | Status: DC | PRN

## 2017-02-12 MED ORDER — METOPROLOL TARTRATE 25 MG PO TABS: 25.0000 mg | ORAL_TABLET | Freq: Two times a day (BID) | ORAL | Status: DC

## 2017-02-12 MED ORDER — APIXABAN 5 MG PO TABS
5.0000 mg | ORAL_TABLET | Freq: Two times a day (BID) | ORAL | Status: DC
Start: 2017-02-12 — End: 2017-02-13
  Administered 2017-02-12 – 2017-02-13 (×3): 5 mg via ORAL
  Filled 2017-02-12 (×3): qty 1

## 2017-02-12 NOTE — Discharge Instructions (Signed)

## 2017-02-12 NOTE — Progress Notes (Addendum)
PT Cancellation Note  Patient Details Name: JAEVION GOTO MRN: [id number] DOB: 02-13-1946   Cancelled Treatment:    Reason Eval/Treat Not Completed: Patient at procedure or test/unavailable. Receiving echo and await RLE doppler   Jivan Symanski B Rhyan Radler 02/12/2017, 11:28 AM  Elwyn Reach, [place]

## 2017-02-12 NOTE — Care Management Obs Status (Signed)
[place] NOTIFICATION   Patient Details  Name: Blake [name] MRN: [id number] Date of Birth: 16-Aug-1946   Medicare Observation Status Notification Given:  Yes    Dawayne Patricia, RN 02/12/2017, 4:43 PM

## 2017-02-12 NOTE — H&P (Signed)
History and Physical  Patient Name: Blake [name]     JME:[id number]    DOB: 02-26-1946    DOA: 02/12/2017 PCP: Scarlette Calico, MD   Patient coming from: Home  Chief Complaint: Dyspnea  HPI: Blake [name] is a 71 y.o. male with a past medical history significant for Afib on Eliquis, CAD s/p CABG in 2012 who presents with dyspnea.  The patient was in his usual state of health until 2 weeks ago when he underwent outpatient rotator cuff surgery. Ever since surgery he is felt "drained" and "completely worn out" and with generalized fatigue. This persisted after he stopped taking opioids for pain relief.  Last week he developed leg swelling and so he saw his Cardiologist's office on Monday where he was noted to have increased SOB, bilateral leg swelling, and 10 lb weight gain.  Plans were made to start lasix BID for three days, obtain a doppler US of the right leg (given slightly asymmetric swelling) and repeat his echocardiogram this week with office follow up on Friday.  Then tonight, he just felt like he couldn't catch his breath.  This was not positional or orthopneic, occurred at rest.  It was not associated with fever, chills, cough, or sputum production. It was not associated with chest pain, or pleuritic pain or hemoptysis.  He is back on his Eliquis.  ED course: -Afebrile, heart rate 94, respirations 24, blood pressure 111/76 initially, pulse ox normal on room air -Na 138, K 3.6, Cr 0.96 (baseline 0.9), WBC 11K, Hgb 12.7 -Troponin normal -BNP 109 -Lactate 1.2 (initial was spurious) -Chest x-ray showed asymmetric left lower lobe opacity -He was given ceftriaxone and azithromycin and TRH was asked to evaluate for pneumonia        ROS: Review of Systems  Constitutional: Positive for malaise/fatigue (for two weeks). Negative for chills and fever.  Respiratory: Positive for shortness of breath. Negative for cough, hemoptysis, sputum production and wheezing.   Cardiovascular:  Positive for leg swelling. Negative for chest pain, palpitations, orthopnea and PND.  Neurological: Negative for loss of consciousness.  All other systems reviewed and are negative.         Past Medical History:  Diagnosis Date  . Angina    none since CABG  . Arthritis   . Bladder spasm    takes Levsin as needed  . CAD (coronary artery disease)   . Diverticulosis   . Duodenal ulcer    hx of  . Dysrhythmia    atrial fib post Cabg/ takes Eliquis daily as well as Metoprolol   . GERD (gastroesophageal reflux disease)    takes Protonix daily  . History of blood transfusion    not abnormal reaction  . History of colon polyps    benign  . History of kidney stones    hx of  . Hyperlipidemia    takes Atorvastatin daily  . HYPERLIPIDEMIA 05/25/2009  . Joint pain   . Nocturia   . PAF (paroxysmal atrial fibrillation) ([place])     Past Surgical History:  Procedure Laterality Date  . CARDIAC CATHETERIZATION     6/12  . COLONOSCOPY    . CORONARY ARTERY BYPASS GRAFT  03/27/2011   x3, Dr Tharon Aquas Trigt    . ESOPHAGOGASTRODUODENOSCOPY ENDOSCOPY  04/12/11   cauterization of bleeding ulcer  and artery in duodenum and stomach   . INGUINAL HERNIA REPAIR  11/06/2011   Procedure: HERNIA REPAIR INGUINAL ADULT;  Surgeon: Earnstine Regal, MD;  Location: [facility];  Service: General;  Laterality: N/A;  Repair left inguinal hernia with mesh  . WISDOM TOOTH EXTRACTION      Social History: Patient lives with his wife.  The patient walks unassisted.  He still drives.  He is a retired Engineer, site.  He is from [place], went to [employer]. He is a remote former smoker.   No Known Allergies  Family history: family history includes Atrial fibrillation in his brother; Cancer in his father; Coronary artery disease in his father; Diabetes in his sister; Heart attack (age of onset: 41) in his sister; Heart attack (age of onset: 70) in his father; Ovarian cancer in his sister.  Prior to  Admission medications   Medication Sig Start Date End Date Taking? Authorizing Provider  alfuzosin (UROXATRAL) 10 MG 24 hr tablet Take 10 mg by mouth daily.  10/22/16  Yes Historical Provider, MD  apixaban (ELIQUIS) 5 MG TABS tablet Take 1 tablet (5 mg total) by mouth 2 (two) times daily. 09/04/16  Yes Lelon Perla, MD  atorvastatin (LIPITOR) 80 MG tablet Take 80 mg by mouth every evening.   Yes Historical Provider, MD  metoprolol (LOPRESSOR) 25 MG tablet Take 1 tablet (25 mg total) by mouth 2 (two) times daily. 02/11/17  Yes Isaiah Serge, NP  pantoprazole (PROTONIX) 40 MG tablet Take 1 tablet (40 mg total) by mouth daily. 06/28/16  Yes Biagio Borg, MD  potassium chloride SA (K-DUR,KLOR-CON) 20 MEQ tablet Take 1 tablet (20 mEq total) by mouth daily. 02/11/17  Yes Isaiah Serge, NP  traMADol (ULTRAM) 50 MG tablet Take 1 tablet (50 mg total) by mouth 2 (two) times daily between meals as needed. 02/04/17  Yes Garald Balding, MD       Physical Exam: BP 116/75   Pulse 90   Temp 97.9 F (36.6 C) (Oral)   Resp 15   Ht 5\' 9"  (1.753 m)   Wt 90.7 kg (200 lb)   SpO2 99%   BMI 29.53 kg/m  General appearance: Well-developed, obese adult male, alert and in no acute distress.   Eyes: Anicteric, conjunctiva pink, lids and lashes normal. PERRL.    ENT: No nasal deformity, discharge, epistaxis.  Hearing normal. OP moist without lesions.   Neck: No neck masses.  Trachea midline.  No thyromegaly/tenderness. Lymph: No cervical or supraclavicular lymphadenopathy. Skin: Warm and dry.  No jaundice.  No suspicious rashes or lesions. Cardiac: RRR, nl S1-S2, no murmurs appreciated.  Capillary refill is brisk.  JVP appears elevated.  Mild pretibial LE edema.  Radial and DP pulses 2+ and symmetric. Respiratory: Mild tachypnea.  CTAB without rales or wheezes. Abdomen: Abdomen soft.  No TTP. No ascites, distension, hepatosplenomegaly.   MSK: No deformities or effusions.  No cyanosis or clubbing. Neuro:  Cranial nerves normal.  Sensation intact to light touch. Speech is fluent.  Muscle strength normal.    Psych: Sensorium intact and responding to questions, attention normal.  Behavior appropriate.  Affect normal.  Judgment and insight appear normal.     Labs on Admission:  I have personally reviewed following labs and imaging studies: CBC:  Recent Labs Lab 02/12/17 0051  WBC 11.0*  NEUTROABS 6.0  HGB 12.7*  HCT 38.2*  MCV 95.0  PLT 329   Basic Metabolic Panel:  Recent Labs Lab 02/12/17 0051  NA 138  K 3.6  CL 103  CO2 23  GLUCOSE 90  BUN 17  CREATININE 0.96  CALCIUM 9.3   GFR:  Estimated Creatinine Clearance: 77.4 mL/min (by C-G formula based on SCr of 0.96 mg/dL).  Liver Function Tests: No results for input(s): AST, ALT, ALKPHOS, BILITOT, PROT, ALBUMIN in the last 168 hours. No results for input(s): LIPASE, AMYLASE in the last 168 hours. No results for input(s): AMMONIA in the last 168 hours. Coagulation Profile: No results for input(s): INR, PROTIME in the last 168 hours. Cardiac Enzymes:  Recent Labs Lab 02/12/17 0051  TROPONINI <0.03   BNP (last 3 results) No results for input(s): PROBNP in the last 8760 hours. HbA1C: No results for input(s): HGBA1C in the last 72 hours. CBG: No results for input(s): GLUCAP in the last 168 hours. Lipid Profile: No results for input(s): CHOL, HDL, LDLCALC, TRIG, CHOLHDL, LDLDIRECT in the last 72 hours. Thyroid Function Tests: No results for input(s): TSH, T4TOTAL, FREET4, T3FREE, THYROIDAB in the last 72 hours. Anemia Panel: No results for input(s): VITAMINB12, FOLATE, FERRITIN, TIBC, IRON, RETICCTPCT in the last 72 hours. Sepsis Labs: Lactic acid 1.2 Invalid input(s): PROCALCITONIN, LACTICIDVEN No results found for this or any previous visit (from the past 240 hour(s)).       Radiological Exams on Admission: Personally reviewed CXR shows asymmetric LLL opacity: Dg Chest Portable 1 View  Result Date:  02/12/2017 CLINICAL DATA:  Acute onset of worsening shortness of breath. Initial encounter. EXAM: PORTABLE CHEST 1 VIEW COMPARISON:  Chest radiograph performed 06/28/2016 FINDINGS: The lungs are well-aerated. Mild left basilar opacity could reflect mild infection, depending on the patient's symptoms. No pleural effusion or pneumothorax is seen. The cardiomediastinal silhouette is normal in size. The patient is status post median sternotomy, with evidence of prior CABG. No acute osseous abnormalities are seen. The patient is status post right-sided rotator cuff repair. IMPRESSION: Mild left basilar airspace opacity may reflect mild infection, depending on the patient's symptoms. Electronically Signed   By: Garald Balding M.D.   On: 02/12/2017 01:19    EKG: Independently reviewed. Rate 95, QTc 469.  Atrial fibrilation, old.  No ST changes.  Echocardiogram 2014: Report reviewed. EF 60% Mild LVH Mild mitral prolapse    Assessment/Plan  1. CAP:  This would be an atypical presentation given absence of fever, WBC, cough.  PE doubted.  CHF considered.  PSI score would be 72. -Continue ceftriaxone and azithromycin for now -Check procalcitonin and de-escalate if normal -Repeat CBC tomorrow   2. Acute CHF:  Suspect diastolic given old echo normal EF.  Denies history of CHF and does not take Lasix daily at baseline.  However, elevated BNP, 10 lbs weight gain, salt intake up, leg swelling, JVP visible. -Furosemide 40 mg IV -K Supple -Daily weights and strict I/Os -Close monitoring of renal function -Will notify Cardiology who saw him yesterday for CHF -Right leg doppler ordered -Echocardiogram ordered -Lower beta-blocker further to 12.5 BID  3. Atrial fibrillation:  CHADS2Vasc 2.  On Eliquis and BB -Continue Eliquis -Continue BB at lowest dose  4. CAD:  No chest pain. -Continue statin, BB  5. Other medications:  -Continue alfuzosin -Continue PPI       DVT prophylaxis: N/A  Code  Status: FULL  Family Communication: None presesnt  Disposition Plan: Anticipate IV diuresis and IV antibiotics, possibly home Wednesday. Consults called: Cardiology via Inbasket Admission status: OBS At the point of initial evaluation, it is my clinical opinion that admission for OBSERVATION is reasonable and necessary because the patient's presenting complaints in the context of their chronic conditions represent sufficient risk of deterioration or significant morbidity to  constitute reasonable grounds for close observation in the hospital setting, but that the patient may be medically stable for discharge from the hospital within 24 to 48 hours.    Medical decision making: Patient seen at 2:50 AM on 02/12/2017.  The patient was discussed with Dr. Christy Gentles.  What exists of the patient's chart was reviewed in depth and summarized above.  Clinical condition: stable.        Edwin Dada [facility] Pager [phone]

## 2017-02-12 NOTE — ED Triage Notes (Signed)
BIB EMS from home, called out for SOB that started 4 hrs ago, seen at cards today for inc leg swelling since shoulder surgery 2 weeks ago. Labored breathing, tachypnea. Takes eliquis for afib

## 2017-02-12 NOTE — ED Provider Notes (Signed)
[facility] DEPT Provider Note   CSN: [id number] Arrival date & time: 02/12/17  9629  By signing my name below, I, Reola Mosher, attest that this documentation has been prepared under the direction and in the presence of Ripley Fraise, MD. Electronically Signed: Reola Mosher, ED Scribe. 02/12/17. 1:23 AM.  History   Chief Complaint Chief Complaint  Patient presents with  . Shortness of Breath    The history is provided by the patient and medical records. No language interpreter was used.  Shortness of Breath  This is a new problem. The problem occurs continuously.The current episode started more than 1 week ago. The problem has been gradually worsening. Associated symptoms include leg swelling. Pertinent negatives include no headaches, no cough, no chest pain, no syncope, no vomiting and no abdominal pain. It is unknown what precipitated the problem. He has tried nothing for the symptoms. Associated medical issues include CAD. Associated medical issues do not include heart failure.    HPI Comments: Blake [name] is a 71 y.o. male BIB EMS, with a PMHx of angina, CAD s/p CABG x3, and HLD who presents to the Emergency Department complaining of persistent SOB that began approximately two weeks ago, worsening since yesterday afternoon. Per pt, he had a rotator cuff repair of the right shoulder two weeks ago on 4/10 (performed by Dr. Durward Fortes), and he has had mild SOB since. He had a follow-up with his surgeon on 4/16 and there were no reported complications;however, it was noted at this appointment that he had bilateral lower extremity swelling and he was advised to follow up with his cardiologist for this issue. He had follow-up with his cardiologist (Dr. Jacalyn Lefevre office) this afternoon where he was placed on lasix 40mg  and potassium as well for his leg swelling. He took one dose of each this afternoon and his leg swelling had improved some, per wife, however, his shortness  of breath has been worsening since this appointment. At that time, his BP was also noted to be 88/62 and his metoprolol was halved. He was scheduled for a doppler US tomorrow morning for his leg swelling to rule out R/O DVT. No history of PE/DVT, CHF. Pt notes that he has been walking around some in his home since his surgery and has not been immobile. He is currently on Eliquis and has been compliant with this medication. He denies CP, back pain, syncope, abdominal pain, nausea, vomiting, diarrhea, HA, or cough.  Past Medical History:  Diagnosis Date  . Angina    none since CABG  . Arthritis   . Bladder spasm    takes Levsin as needed  . CAD (coronary artery disease)   . Diverticulosis   . Duodenal ulcer    hx of  . Dysrhythmia    atrial fib post Cabg/ takes Eliquis daily as well as Metoprolol   . GERD (gastroesophageal reflux disease)    takes Protonix daily  . History of blood transfusion    not abnormal reaction  . History of colon polyps    benign  . History of kidney stones    hx of  . Hyperlipidemia    takes Atorvastatin daily  . HYPERLIPIDEMIA 05/25/2009  . Joint pain   . Nocturia   . PAF (paroxysmal atrial fibrillation) [facility])     Patient Active Problem List   Diagnosis Date Noted  . Unspecified rotator cuff tear or rupture of right shoulder, not specified as traumatic 01/29/2017  . AC (acromioclavicular) arthritis 01/29/2017  .  Tendonitis of upper biceps tendon of right shoulder 01/29/2017  . Pain in joint of right shoulder 10/09/2016  . Primary osteoarthritis of right shoulder 10/09/2016  . Spinal stenosis of lumbar region at multiple levels 12/01/2015  . Routine general medical examination at a health care facility 11/08/2015  . Obesity (BMI 30-39.9) 10/28/2012  . BPH (benign prostatic hyperplasia) 06/08/2011  . Gastric ulcer 06/08/2011  . Coronary artery disease 05/03/2011  . Atrial fibrillation ([place]) 04/05/2011  . Hyperlipidemia 05/25/2009  . BINGE EATING  DISORDER 05/25/2009    Past Surgical History:  Procedure Laterality Date  . CARDIAC CATHETERIZATION     6/12  . COLONOSCOPY    . CORONARY ARTERY BYPASS GRAFT  03/27/2011   x3, Dr Tharon Aquas Trigt    . ESOPHAGOGASTRODUODENOSCOPY ENDOSCOPY  04/12/11   cauterization of bleeding ulcer  and artery in duodenum and stomach   . INGUINAL HERNIA REPAIR  11/06/2011   Procedure: HERNIA REPAIR INGUINAL ADULT;  Surgeon: Earnstine Regal, MD;  Location: [facility];  Service: General;  Laterality: N/A;  Repair left inguinal hernia with mesh  . WISDOM TOOTH EXTRACTION         Home Medications    Prior to Admission medications   Medication Sig Start Date End Date Taking? Authorizing Provider  alfuzosin (UROXATRAL) 10 MG 24 hr tablet Take 10 mg by mouth daily.  10/22/16   Historical Provider, MD  apixaban (ELIQUIS) 5 MG TABS tablet Take 1 tablet (5 mg total) by mouth 2 (two) times daily. 09/04/16   Lelon Perla, MD  atorvastatin (LIPITOR) 80 MG tablet Take 80 mg by mouth every evening.    Historical Provider, MD  furosemide (LASIX) 40 MG tablet Take 1 tablet (40 mg total) by mouth daily. 02/11/17 05/12/17  Isaiah Serge, NP  hyoscyamine (LEVSIN) 0.125 MG/ML solution Take 0.125 mg by mouth every 4 (four) hours as needed for bladder spasms.    Historical Provider, MD  metoprolol (LOPRESSOR) 25 MG tablet Take 1 tablet (25 mg total) by mouth 2 (two) times daily. 02/11/17   Isaiah Serge, NP  pantoprazole (PROTONIX) 40 MG tablet Take 1 tablet (40 mg total) by mouth daily. 06/28/16   Biagio Borg, MD  potassium chloride SA (K-DUR,KLOR-CON) 20 MEQ tablet Take 1 tablet (20 mEq total) by mouth daily. 02/11/17   Isaiah Serge, NP  traMADol (ULTRAM) 50 MG tablet Take 1 tablet (50 mg total) by mouth 2 (two) times daily between meals as needed. 02/04/17   Garald Balding, MD    Family History Family History  Problem Relation Age of Onset  . Coronary artery disease Father   . Heart attack Father 2  . Cancer Father     . Lymphoma    . Diabetes Sister   . Heart attack Sister 68  . Ovarian cancer Sister   . Atrial fibrillation Brother   . Colon cancer Neg Hx   . Stomach cancer Neg Hx     Social History Social History  Substance Use Topics  . Smoking status: Former Smoker    Packs/day: 1.00    Years: 25.00    Quit date: 03/04/1988  . Smokeless tobacco: Never Used  . Alcohol use 0.6 oz/week    1 Cans of beer per week     Comment: occ     Allergies   Patient has no known allergies.   Review of Systems Review of Systems  Respiratory: Positive for shortness of breath. Negative for cough.  Cardiovascular: Positive for leg swelling. Negative for chest pain and syncope.  Gastrointestinal: Negative for abdominal pain, diarrhea, nausea and vomiting.  Musculoskeletal: Negative for back pain.  Neurological: Negative for syncope and headaches.  All other systems reviewed and are negative.    Physical Exam Updated Vital Signs BP 111/76 (BP Location: Left Arm)   Pulse 94   Temp 97.9 F (36.6 C) (Oral)   Resp (!) 24   Ht 5\' 9"  (1.753 m)   Wt 200 lb (90.7 kg)   SpO2 100%   BMI 29.53 kg/m   Physical Exam CONSTITUTIONAL: Well developed/well nourished HEAD: Normocephalic/atraumatic EYES: EOMI/PERRL ENMT: Mucous membranes moist NECK: supple no meningeal signs SPINE/BACK:entire spine nontender CV: irregular, no loud murmurs LUNGS: Lungs are clear to auscultation bilaterally, mild tachypnea noted ABDOMEN: soft, nontender, no rebound or guarding, bowel sounds noted throughout abdomen GU:no cva tenderness NEURO: Pt is awake/alert/appropriate, moves all extremitiesx4.  No facial droop.   EXTREMITIES: pulses normal/equal, full ROM, right arm in sling, well healed incision noted on right shoulder, minimal edema to lower extremities noted SKIN: warm, color normal, PSYCH: no abnormalities of mood noted, alert and oriented to situation   ED Treatments / Results  DIAGNOSTIC STUDIES: Oxygen  Saturation is 100% on RA, adequate by my interpretation.   COORDINATION OF CARE: 1:10 AM-Discussed next steps with pt. Pt verbalized understanding and is agreeable with the plan.    Labs (all labs ordered are listed, but only abnormal results are displayed) Labs Reviewed  CBC WITH DIFFERENTIAL/PLATELET - Abnormal; Notable for the following:       Result Value   WBC 11.0 (*)    RBC 4.02 (*)    Hemoglobin 12.7 (*)    HCT 38.2 (*)    Monocytes Absolute 1.5 (*)    All other components within normal limits  BRAIN NATRIURETIC PEPTIDE - Abnormal; Notable for the following:    B Natriuretic Peptide 109.2 (*)    All other components within normal limits  I-STAT CG4 LACTIC ACID, ED - Abnormal; Notable for the following:    Lactic Acid, Venous 2.35 (*)    All other components within normal limits  BASIC METABOLIC PANEL  TROPONIN I  I-STAT CG4 LACTIC ACID, ED    EKG  EKG Interpretation  Date/Time:  Tuesday February 12 2017 00:45:55 EDT Ventricular Rate:  95 PR Interval:    QRS Duration: 87 QT Interval:  373 QTC Calculation: 469 R Axis:   52 Text Interpretation:  Atrial fibrillation Ventricular premature complex Minimal ST depression, inferior leads Abnormal ekg Confirmed by Christy Gentles  MD, Fynn Vanblarcom ([id number]) on 02/12/2017 12:53:07 AM       Radiology Dg Chest Portable 1 View  Result Date: 02/12/2017 CLINICAL DATA:  Acute onset of worsening shortness of breath. Initial encounter. EXAM: PORTABLE CHEST 1 VIEW COMPARISON:  Chest radiograph performed 06/28/2016 FINDINGS: The lungs are well-aerated. Mild left basilar opacity could reflect mild infection, depending on the patient's symptoms. No pleural effusion or pneumothorax is seen. The cardiomediastinal silhouette is normal in size. The patient is status post median sternotomy, with evidence of prior CABG. No acute osseous abnormalities are seen. The patient is status post right-sided rotator cuff repair. IMPRESSION: Mild left basilar airspace  opacity may reflect mild infection, depending on the patient's symptoms. Electronically Signed   By: Garald Balding M.D.   On: 02/12/2017 01:19    Procedures Procedures (including critical care time)  Medications Ordered in ED Medications  cefTRIAXone (ROCEPHIN) 1 g in dextrose 5 %  50 mL IVPB (not administered)  azithromycin (ZITHROMAX) tablet 500 mg (not administered)  sodium chloride 0.9 % bolus 500 mL (not administered)     Initial Impression / Assessment and Plan / ED Course  I have reviewed the triage vital signs and the nursing notes.  Pertinent labs & imaging results that were available during my care of the patient were reviewed by me and considered in my medical decision making (see chart for details).    2:46 AM  Pt stable Pneumonia noted No recent overnight hospitalizations Not septic (lactate normal, initial lactate error) Pt awake/alert He can still get DVT study later this morning, but he is already on eliquis D/w dr danford for admission   Final Clinical Impressions(s) / ED Diagnoses   Final diagnoses:  Community acquired pneumonia of left lower lobe of lung ([place])    New Prescriptions New Prescriptions   No medications on file   I personally performed the services described in this documentation, which was scribed in my presence. The recorded information has been reviewed and is accurate.         Ripley Fraise, MD 02/12/17 [phone]

## 2017-02-12 NOTE — Progress Notes (Signed)
PROGRESS NOTE                                                                                                                                                                                                             Patient Demographics:    Blake [name], is a 71 y.o. male, DOB - 1946-05-10, BUL:[id number]  Admit date - 02/12/2017   Admitting Physician Edwin Dada, MD  Outpatient Primary MD for the patient is Scarlette Calico, MD  LOS - 0  Outpatient Specialists: [facility] cardiology  Chief Complaint  Patient presents with  . Shortness of Breath       Brief Narrative   71 year old male with history of A. fib on Eliquis, CAD status post CABG in 2012, recent right rotator cuff surgery 2 weeks back who was seen in the cardiologist office on the day prior to admission for increasing shortness of breath, bilateral leg swellings and almost 10 pound weight gain. He was started on Lasix and ordered for Doppler of the right leg (given asymmetric swelling) and repeat echo in the office during follow-up later this week. On the night of admission he had progressive dyspnea, with symptoms even at rest on orthopnea or PND. Denied any fevers, chills, cough, chest pain or palpitations. In the ED chest x-ray was concerning for possible left lower lobe pneumonia and started on antibiotics however given his clinical symptoms it appeared he had acute congestive heart failure.   Subjective:   [name] Breathing is better since admission.   Assessment  & Plan :    Principal Problem: Acute congestive heart failure, unspecified Symptoms of progressive dyspnea with leg swellings and weight gain are more likely of congestive heart failure. No signs or symptoms suggestive of pneumonia. On IV Lasix 40 mg every 12 hours. Monitor strict I/O and daily weight. Check 2-D echo and Doppler right lower extremity. Continue metoprolol at low dose. Continue  statin. Discontinue antibiotics.  Consult heart failure team if necessary, otherwise can be followed up in the office on 4/27.   Active Problems:   Atrial fibrillation, chronic (HCC) Right controlled. Continue metoprolol and anticoagulation.    Coronary artery disease due to lipid rich plaque Continue beta blocker and statin.  Recent right rotator cuff repair. Pain stable on Tylenol. ordered PT/OT.      Code Status : Full code  Family Communication  : None at bedside  Disposition Plan  : Home possibly in the next 24-48 hours  Barriers For Discharge : Active symptoms  Consults  :  None  Procedures  :  2-D echo Doppler right lower extremity  DVT Prophylaxis  :  On eliquis  Lab Results  Component Value Date   PLT 279 02/12/2017    Antibiotics  :    Anti-infectives    Start     Dose/Rate Route Frequency Ordered Stop   02/12/17 2200  cefTRIAXone (ROCEPHIN) 1 g in dextrose 5 % 50 mL IVPB  Status:  Discontinued     1 g 100 mL/hr over 30 Minutes Intravenous Every 24 hours 02/12/17 0404 02/12/17 0932   02/12/17 2200  azithromycin (ZITHROMAX) tablet 250 mg  Status:  Discontinued     250 mg Oral Every 24 hours 02/12/17 0404 02/12/17 0932   02/12/17 0215  cefTRIAXone (ROCEPHIN) 1 g in dextrose 5 % 50 mL IVPB     1 g 100 mL/hr over 30 Minutes Intravenous  Once 02/12/17 0211 02/12/17 0340   02/12/17 0215  azithromycin (ZITHROMAX) tablet 500 mg     500 mg Oral  Once 02/12/17 0211 02/12/17 0256        Objective:   Vitals:   02/12/17 0300 02/12/17 0330 02/12/17 0356 02/12/17 1013  BP: 116/75 102/66 113/70 99/66  Pulse: 90 80 90 87  Resp: 15 11 18    Temp:   97.5 F (36.4 C)   TempSrc:   Oral   SpO2: 99% 99% 99%   Weight:   89.1 kg (196 lb 8 oz)   Height:   5\' 6"  (1.676 m)     Wt Readings from Last 3 Encounters:  02/12/17 89.1 kg (196 lb 8 oz)  02/11/17 92 kg (202 lb 12.8 oz)  02/04/17 90.7 kg (200 lb)     Intake/Output Summary (Last 24 hours) at 02/12/17  1110 Last data filed at 02/12/17 1055  Gross per 24 hour  Intake              240 ml  Output              900 ml  Net             -660 ml     Physical Exam  Gen: not in distress HEENT:moist mucosa, supple neck Chest: clear b/l, no added sounds CVS: S1 and S2 irregular, no murmurs rub or gallop GI: soft, NT, ND, Musculoskeletal: warm, 1+ pitting edema bilaterally (R >L), limited mobility of right arm     Data Review:    CBC  Recent Labs Lab 02/12/17 0051 02/12/17 0428  WBC 11.0* 10.2  HGB 12.7* 12.0*  HCT 38.2* 36.1*  PLT 290 279  MCV 95.0 96.0  MCH 31.6 31.9  MCHC 33.2 33.2  RDW 13.1 13.3  LYMPHSABS 3.3  --   MONOABS 1.5*  --   EOSABS 0.2  --   BASOSABS 0.1  --     Chemistries   Recent Labs Lab 02/12/17 0051  NA 138  K 3.6  CL 103  CO2 23  GLUCOSE 90  BUN 17  CREATININE 0.96  CALCIUM 9.3   ------------------------------------------------------------------------------------------------------------------ No results for input(s): CHOL, HDL, LDLCALC, TRIG, CHOLHDL, LDLDIRECT in the last 72 hours.  Lab Results  Component Value Date   HGBA1C 5.8 11/07/2015   ------------------------------------------------------------------------------------------------------------------ No results for input(s): TSH, T4TOTAL, T3FREE, THYROIDAB in the last 72 hours.  Invalid  input(s): FREET3 ------------------------------------------------------------------------------------------------------------------ No results for input(s): VITAMINB12, FOLATE, FERRITIN, TIBC, IRON, RETICCTPCT in the last 72 hours.  Coagulation profile No results for input(s): INR, PROTIME in the last 168 hours.  No results for input(s): DDIMER in the last 72 hours.  Cardiac Enzymes  Recent Labs Lab 02/12/17 0051  TROPONINI <0.03   ------------------------------------------------------------------------------------------------------------------    Component Value Date/Time   BNP 109.2  (H) 02/12/2017 0054    Inpatient Medications  Scheduled Meds: . alfuzosin  10 mg Oral Daily  . apixaban  5 mg Oral BID  . atorvastatin  80 mg Oral QPM  . feeding supplement (ENSURE ENLIVE)  237 mL Oral BID BM  . furosemide  40 mg Intravenous Q12H  . metoprolol tartrate  12.5 mg Oral BID  . pantoprazole  40 mg Oral Daily  . potassium chloride SA  40 mEq Oral Daily   Continuous Infusions: PRN Meds:.acetaminophen **OR** acetaminophen, ondansetron **OR** ondansetron (ZOFRAN) IV  Micro Results No results found for this or any previous visit (from the past 240 hour(s)).  Radiology Reports Dg Chest Portable 1 View  Result Date: 02/12/2017 CLINICAL DATA:  Acute onset of worsening shortness of breath. Initial encounter. EXAM: PORTABLE CHEST 1 VIEW COMPARISON:  Chest radiograph performed 06/28/2016 FINDINGS: The lungs are well-aerated. Mild left basilar opacity could reflect mild infection, depending on the patient's symptoms. No pleural effusion or pneumothorax is seen. The cardiomediastinal silhouette is normal in size. The patient is status post median sternotomy, with evidence of prior CABG. No acute osseous abnormalities are seen. The patient is status post right-sided rotator cuff repair. IMPRESSION: Mild left basilar airspace opacity may reflect mild infection, depending on the patient's symptoms. Electronically Signed   By: Garald Balding M.D.   On: 02/12/2017 01:19    Time Spent in minutes  25   Louellen Molder M.D on 02/12/2017 at 11:10 AM  Between 7am to 7pm - Pager - [phone]  After 7pm go to [email or web address] - password [facility]  [facility] -  Office  [phone]

## 2017-02-12 NOTE — ED Provider Notes (Signed)
This patients CHA2DS2-VASc Score and unadjusted Ischemic Stroke Rate (% per year) is equal to 2.2 % stroke rate/year from a score of 2  Above score calculated as 1 point each if present [CHF, HTN, DM, Vascular=MI/PAD/Aortic Plaque, Age if 65-74, or Male] Above score calculated as 2 points each if present [Age > 75, or Stroke/TIA/TE]     Ripley Fraise, MD 02/12/17 [phone]

## 2017-02-12 NOTE — Progress Notes (Signed)
Patient admitted from ED. Alert and oriented. Vitals stable.Telemetry called and verified by two nurses.Skin assessment done two nurses verified.

## 2017-02-12 NOTE — Consult Note (Signed)
CARDIOLOGY CONSULT NOTE   Patient ID: Blake [name] MRN: [id number] DOB/AGE: 01-13-1946 71 y.o.  Admit date: 02/12/2017  Primary Physician   Scarlette Calico, MD Primary Cardiologist   Dr Stanford Breed 09/04/2016 Cecilie Kicks, NP 02/11/2017 Reason for Consultation   CHF Requesting MD: Dr Clementeen Graham  HPI: Blake [name] is a 71 y.o. male with hx of CABG 2012 w/ LIMA-LAD, SVG-D1, SVG-AM, and post-op Afib on apixaban, EF 60% echo 2014, HLD, HTN. R rotator cuff surgery on 04/10.   04/23 office visit pt was volume overloaded on exam, wt 202 lbs, started on Lasix 40 mg po bid, f/u later this week. SBP 88, pt BB decreased. Pt was in afib.  SOB worsened and pt came to the ER early am 04/24. Dx PNA and CHF exacerbation, cards asked to see.   Pt is being seen today for the evaluation of CHF at the request of Dr Clementeen Graham.  Pt noticed increasing DOE since the shoulder surgery. His weight was about 200 lbs on his home scale, but he was not weighing every day. He feels his weight is up 6-8 lbs since the surgery. He was very surprised to hear his weight was 202 lbs in the office yesterday. His weight before the surgery was 200 lbs but he feels his breathing was much better then.   Now his weight is 196 lbs, but he still feels his breathing is not back to baseline. He feels his breathing was not that bad, although his wife felt he had increased DOE. R>L LE edema started right after the surgery. He had to sleep sitting up because of his shoulder, denies PND or orthopnea. Thought the LE edema was from his legs hanging down all the time, did not really think about anything else.   He has not had chest pain.    Past Medical History:  Diagnosis Date  . Angina    none since CABG  . Arthritis   . Bladder spasm    takes Levsin as needed  . CAD (coronary artery disease)   . Diverticulosis   . Duodenal ulcer    hx of  . Dysrhythmia    atrial fib post Cabg/ takes Eliquis daily as well as Metoprolol     . GERD (gastroesophageal reflux disease)    takes Protonix daily  . History of blood transfusion    not abnormal reaction  . History of colon polyps    benign  . History of kidney stones    hx of  . Hyperlipidemia    takes Atorvastatin daily  . HYPERLIPIDEMIA 05/25/2009  . Joint pain   . Nocturia   . PAF (paroxysmal atrial fibrillation) ([place])      Past Surgical History:  Procedure Laterality Date  . CARDIAC CATHETERIZATION     6/12  . COLONOSCOPY    . CORONARY ARTERY BYPASS GRAFT  03/27/2011   x3, Dr Tharon Aquas Trigt    . ESOPHAGOGASTRODUODENOSCOPY ENDOSCOPY  04/12/11   cauterization of bleeding ulcer  and artery in duodenum and stomach   . INGUINAL HERNIA REPAIR  11/06/2011   Procedure: HERNIA REPAIR INGUINAL ADULT;  Surgeon: Earnstine Regal, MD;  Location: [facility];  Service: General;  Laterality: N/A;  Repair left inguinal hernia with mesh  . WISDOM TOOTH EXTRACTION      No Known Allergies  I have reviewed the patient's current medications . alfuzosin  10 mg Oral Daily  . apixaban  5 mg Oral BID  .  atorvastatin  80 mg Oral QPM  . feeding supplement (ENSURE ENLIVE)  237 mL Oral BID BM  . furosemide  40 mg Intravenous Q12H  . metoprolol tartrate  25 mg Oral BID  . pantoprazole  40 mg Oral Daily  . potassium chloride SA  40 mEq Oral Daily    acetaminophen **OR** acetaminophen, ondansetron **OR** ondansetron (ZOFRAN) IV  Prior to Admission medications   Medication Sig Start Date End Date Taking? Authorizing Provider  alfuzosin (UROXATRAL) 10 MG 24 hr tablet Take 10 mg by mouth daily.  10/22/16  Yes Historical Provider, MD  apixaban (ELIQUIS) 5 MG TABS tablet Take 1 tablet (5 mg total) by mouth 2 (two) times daily. 09/04/16  Yes Lelon Perla, MD  atorvastatin (LIPITOR) 80 MG tablet Take 80 mg by mouth every evening.   Yes Historical Provider, MD  metoprolol (LOPRESSOR) 25 MG tablet Take 1 tablet (25 mg total) by mouth 2 (two) times daily. 02/11/17  Yes Isaiah Serge, NP   pantoprazole (PROTONIX) 40 MG tablet Take 1 tablet (40 mg total) by mouth daily. 06/28/16  Yes Biagio Borg, MD  potassium chloride SA (K-DUR,KLOR-CON) 20 MEQ tablet Take 1 tablet (20 mEq total) by mouth daily. 02/11/17  Yes Isaiah Serge, NP  traMADol (ULTRAM) 50 MG tablet Take 1 tablet (50 mg total) by mouth 2 (two) times daily between meals as needed. 02/04/17  Yes Garald Balding, MD     Social History   Social History  . Marital status: Married    Spouse name: N/A  . Number of children: 4  . Years of education: N/A   Occupational History  . retired     Social History Main Topics  . Smoking status: Former Smoker    Packs/day: 1.00    Years: 25.00    Quit date: 03/04/1988  . Smokeless tobacco: Never Used  . Alcohol use 0.6 oz/week    1 Cans of beer per week     Comment: occ  . Drug use: No  . Sexual activity: Not on file   Other Topics Concern  . Not on file   Social History Narrative   Regular exercise- yes     Family Status  Relation Status  . Father Deceased at age 20   cancer  . Mother Deceased at age 52   CHF  .    [name] Sister   . Sister   . Sister   . Brother   . Maternal Grandmother Deceased  . Maternal Grandfather Deceased  . Paternal Grandmother Deceased  . Paternal Grandfather Deceased  . Neg Hx    Family History  Problem Relation Age of Onset  . Coronary artery disease Father   . Heart attack Father 56  . Cancer Father   . Lymphoma    . Diabetes Sister   . Heart attack Sister 79  . Ovarian cancer Sister   . Atrial fibrillation Brother   . Colon cancer Neg Hx   . Stomach cancer Neg Hx      ROS:  Full 14 point review of systems complete and found to be negative unless listed above.  Physical Exam: Blood pressure (!) 106/52, pulse 76, temperature 97.9 F (36.6 C), temperature source Oral, resp. rate 18, height 5\' 6"  (1.676 m), weight 196 lb 8 oz (89.1 kg), SpO2 100 %.  General: Well developed, well nourished, male in no acute  distress Head: Eyes PERRLA, No xanthomas.   Normocephalic and atraumatic, oropharynx without  edema or exudate. Dentition: good Lungs: some rales bases, good air exchange Heart: HRRR S1 S2, no rub/gallop, no sig murmur. pulses are 2+ all 4 extrem.   Neck: No carotid bruits. No lymphadenopathy.  JVD elevated approx 9 cm, +HJR Abdomen: Bowel sounds present, abdomen soft and non-tender without masses or hernias noted. Msk:  No spine or cva tenderness. No weakness, no joint deformities or effusions. Extremities: No clubbing or cyanosis. Trace pedal edema.  Neuro: Alert and oriented X 3. No focal deficits noted. Psych:  Good affect, responds appropriately Skin: No rashes or lesions noted.  Labs:   Lab Results  Component Value Date   WBC 10.2 02/12/2017   HGB 12.0 (L) 02/12/2017   HCT 36.1 (L) 02/12/2017   MCV 96.0 02/12/2017   PLT 279 02/12/2017    Recent Labs Lab 02/12/17 0051  NA 138  K 3.6  CL 103  CO2 23  BUN 17  CREATININE 0.96  CALCIUM 9.3  GLUCOSE 90   Magnesium  Date Value Ref Range Status  02/12/2017 2.1 1.7 - 2.4 mg/dL Final    Recent Labs  02/12/17 0051  TROPONINI <0.03   Brain Natriuretic Peptide  Date/Time Value Ref Range Status  02/11/2017 04:09 PM 122.8 (H) <100 pg/mL Final   B Natriuretic Peptide  Date/Time Value Ref Range Status  02/12/2017 12:54 AM 109.2 (H) 0.0 - 100.0 pg/mL Final    TSH  Date/Time Value Ref Range Status  11/07/2015 04:43 PM 0.82 0.35 - 4.50 uIU/mL Final    Echo: 02/12/2017 - Left ventricle: The cavity size was normal. Wall thickness was   increased in a pattern of mild LVH. Systolic function was normal.   The estimated ejection fraction was in the range of 55% to 60%.   Wall motion was normal; there were no regional wall motion abnormalities. - Aortic valve: There was trivial regurgitation. - Mitral valve: Calcified annulus. There was moderate to severe regurgitation. - Left atrium: The atrium was moderately  dilated. Impressions: - Normal LV systolic function; trace AI; moderate to severe MR;   moderate LAE.  ECG:  04/24 Atrial fib, HR 95 No acute ischemic changes  Cath: None since CABG  Radiology:  Dg Chest Portable 1 View Result Date: 02/12/2017 CLINICAL DATA:  Acute onset of worsening shortness of breath. Initial encounter. EXAM: PORTABLE CHEST 1 VIEW COMPARISON:  Chest radiograph performed 06/28/2016 FINDINGS: The lungs are well-aerated. Mild left basilar opacity could reflect mild infection, depending on the patient's symptoms. No pleural effusion or pneumothorax is seen. The cardiomediastinal silhouette is normal in size. The patient is status post median sternotomy, with evidence of prior CABG. No acute osseous abnormalities are seen. The patient is status post right-sided rotator cuff repair. IMPRESSION: Mild left basilar airspace opacity may reflect mild infection, depending on the patient's symptoms. Electronically Signed   By: Garald Balding M.D.   On: 02/12/2017 01:19    ASSESSMENT AND PLAN:   The patient was seen today by Dr Debara Pickett, the patient evaluated and the data reviewed.   Principal Problem:   Dyspnea - think mainly due to CHF, see below  Active Problems:   Atrial fibrillation, chronic (HCC) - HR generally well-controlled, continue current rx - HR ok on decreased dose of BB    Coronary artery disease due to lipid rich plaque - no ongoing ischemic sx, continue statin and BB - no ASA 2nd Eliquis    Community acquired pneumonia of left lower lobe of lung ([place]) - per IM -  no fevers, no cough, WBCs ok.    Acute diastolic CHF (congestive heart failure) (HCC) - diastolic dysfunction not described on echo - E-e ratio indeterminate and PAS 25 so not sure he needs more diuresis - he does not feel resp status is back to baseline.  - may also have an element of deconditioning from prolonged inactivity after surgery. - would either convert to po Lasix now or continue IV rx  overnight, and possibly change to po in am. - Follow BMET while on Lasix.   SignedLenoard Aden 02/12/2017 2:41 PM Beeper [phone]  Co-Sign MD

## 2017-02-12 NOTE — ED Notes (Signed)
Lactic of 2.35 is old blood, lactic of 1.18 is blood that was freshly drawn by phlebotomy. EDP aware that lactic of 1.18 is the correct lactic.

## 2017-02-12 NOTE — Progress Notes (Signed)
  Echocardiogram 2D Echocardiogram has been performed.  Matilde Bash 02/12/2017, 11:41 AM

## 2017-02-13 ENCOUNTER — Encounter (HOSPITAL_COMMUNITY): Payer: Medicare Other

## 2017-02-13 ENCOUNTER — Observation Stay (HOSPITAL_COMMUNITY): Payer: Medicare Other

## 2017-02-13 DIAGNOSIS — J181 Lobar pneumonia, unspecified organism: Secondary | ICD-10-CM

## 2017-02-13 DIAGNOSIS — R0600 Dyspnea, unspecified: Secondary | ICD-10-CM | POA: Diagnosis not present

## 2017-02-13 DIAGNOSIS — I340 Nonrheumatic mitral (valve) insufficiency: Secondary | ICD-10-CM

## 2017-02-13 DIAGNOSIS — I482 Chronic atrial fibrillation: Secondary | ICD-10-CM

## 2017-02-13 DIAGNOSIS — I5031 Acute diastolic (congestive) heart failure: Secondary | ICD-10-CM | POA: Diagnosis not present

## 2017-02-13 LAB — BASIC METABOLIC PANEL
Anion gap: 11 (ref 5–15)
BUN: 24 mg/dL — ABNORMAL HIGH (ref 6–20)
CALCIUM: 9.4 mg/dL (ref 8.9–10.3)
CO2: 24 mmol/L (ref 22–32)
CREATININE: 0.96 mg/dL (ref 0.61–1.24)
Chloride: 104 mmol/L (ref 101–111)
GFR calc Af Amer: >60 mL/min (ref >60)
GFR calc non Af Amer: >60 mL/min (ref >60)
Glucose, Bld: 105 mg/dL — ABNORMAL HIGH (ref 65–99)
Potassium: 3.8 mmol/L (ref 3.5–5.1)
Sodium: 139 mmol/L (ref 135–145)

## 2017-02-13 MED ORDER — METOPROLOL TARTRATE 25 MG PO TABS: 37.5000 mg | ORAL_TABLET | Freq: Two times a day (BID) | ORAL | Status: DC

## 2017-02-13 MED ORDER — DILTIAZEM HCL 25 MG/5ML IV SOLN: 15.0000 mg | Freq: Once | INTRAVENOUS | Status: DC

## 2017-02-13 MED ORDER — METOPROLOL TARTRATE 12.5 MG HALF TABLET
12.5000 mg | ORAL_TABLET | ORAL | Status: AC
Start: 2017-02-13 — End: 2017-02-13
  Administered 2017-02-13: 12.5 mg via ORAL
  Filled 2017-02-13: qty 1

## 2017-02-13 MED ORDER — LIVING BETTER WITH HEART FAILURE BOOK: Freq: Once | Status: DC

## 2017-02-13 MED ORDER — FUROSEMIDE 40 MG PO TABS: 40.0000 mg | ORAL_TABLET | Freq: Every day | ORAL | 1 refills | Status: DC

## 2017-02-13 MED ORDER — DILTIAZEM HCL 25 MG/5ML IV SOLN
10.0000 mg | Freq: Once | INTRAVENOUS | Status: AC
  Administered 2017-02-13: 10 mg via INTRAVENOUS
  Filled 2017-02-13: qty 5

## 2017-02-13 MED ORDER — METOPROLOL TARTRATE 37.5 MG PO TABS: 37.5000 mg | ORAL_TABLET | Freq: Two times a day (BID) | ORAL | 2 refills | Status: DC

## 2017-02-13 MED ORDER — YOU HAVE A PACEMAKER BOOK
Freq: Once | Status: DC
  Filled 2017-02-13: qty 1

## 2017-02-13 MED ORDER — SODIUM CHLORIDE 0.9 % IV BOLUS (SEPSIS)
500.0000 mL | Freq: Once | INTRAVENOUS | Status: AC
  Administered 2017-02-13: 500 mL via INTRAVENOUS

## 2017-02-13 NOTE — Progress Notes (Signed)
D/c instructions discussed with pt and he verbalized understanding.

## 2017-02-13 NOTE — Progress Notes (Signed)
Pt ambulated in the hallway. HR range: 91-113, Afib. BP 108/67.

## 2017-02-13 NOTE — Progress Notes (Signed)
[facility] PROGRESS NOTE  Blake [name] EQA:[id number] DOB: 1946/06/24 DOA: 02/12/2017   PCP: Scarlette Calico, MD     Assessment/Plan: Principal Problem:   Dyspnea Active Problems:   Atrial fibrillation, chronic (HCC)   Coronary artery disease due to lipid rich plaque   Community acquired pneumonia of left lower lobe of lung ([place])   Acute diastolic CHF (congestive heart failure) ([place])   71 y.o. male with a past medical history significant for Afib on Eliquis, CAD s/p CABG in 2012 who presents with dyspnea.The patient was in his usual state of health until 2 weeks ago when he underwent outpatient rotator cuff surgery.  .Over the past several days he became particular more short of breath and was noted to have some increasing lower extremity swelling. He was seen in the office by cards , who felt he was in decompensated heart failure and start him on a diuretic. 4/24  his shortness of breath worsened and he presented to the emergency department. He responded initially to IV Lasix with a brisk diuresis and feels that his breathing is improved significantly but not at baseline  Assessment /plan  1. Ruled out for CAP:   atypical presentation given absence of fever, WBC, cough.  PE doubted.  CHF considered.  PSI score would be 72. STOPPED  ceftriaxone and azithromycin for now procalcitonin <0.10 Wbc nl Strep pneumo antigen negative    2. Acute CHF:  Suspect diastolic given old echo normal EF.  Denies history of CHF and does not take Lasix daily at baseline.  However, elevated BNP, 10 lbs weight gain, salt intake up, leg swelling, JVP visible. received IV lasix ,now on Furosemide 40 mg  -K Supple -Daily weights and strict I/Os -Close monitoring of renal function - Cardiology following  -Right leg doppler ordered -Echocardiogram LV EF: 55% -   60% -Lower beta-blocker further to 12.5 BID  3. Atrial fibrillation with rapid ventricular response: Heart rate uncontrolled  , HR in 110-120's  CHADS2Vasc 2.  On Eliquis and BB -Continue Eliquis -Continue BB at lowest dose, patient received 1 dose of IV Cardizem push Discussed with Dr. Debara Pickett, blood pressure soft  4. CAD:  No chest pain. Continue statin, BB  5. Other medications:  -Continue alfuzosin -Continue PPI   DVT prophylaxsis eliquis   Code Status:  Full code    Family Communication: Discussed in detail with the patient, all imaging results, lab results explained to the patient   Disposition Plan:  2-3 days , HR not controlled      Consultants:  Cards   Procedures:  None   Antibiotics: Anti-infectives    Start     Dose/Rate Route Frequency Ordered Stop   02/12/17 2200  cefTRIAXone (ROCEPHIN) 1 g in dextrose 5 % 50 mL IVPB  Status:  Discontinued     1 g 100 mL/hr over 30 Minutes Intravenous Every 24 hours 02/12/17 0404 02/12/17 0932   02/12/17 2200  azithromycin (ZITHROMAX) tablet 250 mg  Status:  Discontinued     250 mg Oral Every 24 hours 02/12/17 0404 02/12/17 0932   02/12/17 0215  cefTRIAXone (ROCEPHIN) 1 g in dextrose 5 % 50 mL IVPB     1 g 100 mL/hr over 30 Minutes Intravenous  Once 02/12/17 0211 02/12/17 0340   02/12/17 0215  azithromycin (ZITHROMAX) tablet 500 mg     500 mg Oral  Once 02/12/17 0211 02/12/17 0256         HPI/Subjective:  HR in the 130's To 150s since midnight  Objective: Vitals:   02/12/17 1316 02/12/17 2028 02/13/17 0318 02/13/17 0808  BP: (!) 106/52 113/70 107/68 100/60  Pulse: 76 98 94 (!) 137  Resp: 18 18 18    Temp: 97.9 F (36.6 C) 98.6 F (37 C) 97.8 F (36.6 C)   TempSrc: Oral Oral Oral   SpO2: 100% 97% 97%   Weight:   86.8 kg (191 lb 4.8 oz)   Height:        Intake/Output Summary (Last 24 hours) at 02/13/17 0840 Last data filed at 02/13/17 0800  Gross per 24 hour  Intake              960 ml  Output             4250 ml  Net            -3290 ml    Exam:  General: Well developed, well nourished, male in no acute  distress Head: Eyes PERRLA, No xanthomas.   Normocephalic and atraumatic, oropharynx without edema or exudate. Dentition: good Lungs: some rales bases, good air exchange Heart: HRRR S1 S2, no rub/gallop, no sig murmur. pulses are 2+ all 4 extrem.   Neck: No carotid bruits. No lymphadenopathy.  JVD elevated approx 9 cm, +HJR Abdomen: Bowel sounds present, abdomen soft and non-tender without masses or hernias noted. Msk:  No spine or cva tenderness. No weakness, no joint deformities or effusions. Extremities: No clubbing or cyanosis. Trace pedal edema. .     Data Reviewed: I have personally reviewed following labs and imaging studies  Micro Results No results found for this or any previous visit (from the past 240 hour(s)).  Radiology Reports Dg Chest Portable 1 View  Result Date: 02/12/2017 CLINICAL DATA:  Acute onset of worsening shortness of breath. Initial encounter. EXAM: PORTABLE CHEST 1 VIEW COMPARISON:  Chest radiograph performed 06/28/2016 FINDINGS: The lungs are well-aerated. Mild left basilar opacity could reflect mild infection, depending on the patient's symptoms. No pleural effusion or pneumothorax is seen. The cardiomediastinal silhouette is normal in size. The patient is status post median sternotomy, with evidence of prior CABG. No acute osseous abnormalities are seen. The patient is status post right-sided rotator cuff repair. IMPRESSION: Mild left basilar airspace opacity may reflect mild infection, depending on the patient's symptoms. Electronically Signed   By: Garald Balding M.D.   On: 02/12/2017 01:19     CBC  Recent Labs Lab 02/11/17 1609 02/12/17 0051 02/12/17 0428  WBC 9.8 11.0* 10.2  HGB 12.1* 12.7* 12.0*  HCT 36.5* 38.2* 36.1*  PLT 306 290 279  MCV 95.5 95.0 96.0  MCH 31.7 31.6 31.9  MCHC 33.2 33.2 33.2  RDW 13.4 13.1 13.3  LYMPHSABS 1,960 3.3  --   MONOABS 1,176* 1.5*  --   EOSABS 196 0.2  --   BASOSABS 98 0.1  --     Chemistries   Recent  Labs Lab 02/11/17 1609 02/12/17 0051 02/12/17 0400 02/13/17 0432  NA 140 138  --  139  K 4.6 3.6  --  3.8  CL 105 103  --  104  CO2 27 23  --  24  GLUCOSE 91 90  --  105*  BUN 19 17  --  24*  CREATININE 0.92 0.96  --  0.96  CALCIUM 9.4 9.3  --  9.4  MG  --   --  2.1  --    ------------------------------------------------------------------------------------------------------------------ estimated creatinine clearance is  71.8 mL/min (by C-G formula based on SCr of 0.96 mg/dL). ------------------------------------------------------------------------------------------------------------------ No results for input(s): HGBA1C in the last 72 hours. ------------------------------------------------------------------------------------------------------------------ No results for input(s): CHOL, HDL, LDLCALC, TRIG, CHOLHDL, LDLDIRECT in the last 72 hours. ------------------------------------------------------------------------------------------------------------------ No results for input(s): TSH, T4TOTAL, T3FREE, THYROIDAB in the last 72 hours.  Invalid input(s): FREET3 ------------------------------------------------------------------------------------------------------------------ No results for input(s): VITAMINB12, FOLATE, FERRITIN, TIBC, IRON, RETICCTPCT in the last 72 hours.  Coagulation profile No results for input(s): INR, PROTIME in the last 168 hours.  No results for input(s): DDIMER in the last 72 hours.  Cardiac Enzymes  Recent Labs Lab 02/12/17 0051  TROPONINI <0.03   ------------------------------------------------------------------------------------------------------------------ Invalid input(s): POCBNP   CBG: No results for input(s): GLUCAP in the last 168 hours.     Studies: Dg Chest Portable 1 View  Result Date: 02/12/2017 CLINICAL DATA:  Acute onset of worsening shortness of breath. Initial encounter. EXAM: PORTABLE CHEST 1 VIEW COMPARISON:  Chest  radiograph performed 06/28/2016 FINDINGS: The lungs are well-aerated. Mild left basilar opacity could reflect mild infection, depending on the patient's symptoms. No pleural effusion or pneumothorax is seen. The cardiomediastinal silhouette is normal in size. The patient is status post median sternotomy, with evidence of prior CABG. No acute osseous abnormalities are seen. The patient is status post right-sided rotator cuff repair. IMPRESSION: Mild left basilar airspace opacity may reflect mild infection, depending on the patient's symptoms. Electronically Signed   By: Garald Balding M.D.   On: 02/12/2017 01:19      Lab Results  Component Value Date   HGBA1C 5.8 11/07/2015   HGBA1C 5.9 12/11/2013   HGBA1C  03/23/2011    5.5 (NOTE)                                                                       According to the ADA Clinical Practice Recommendations for 2011, when HbA1c is used as a screening test:   >=6.5%   Diagnostic of Diabetes Mellitus           (if abnormal result  is confirmed)  5.7-6.4%   Increased risk of developing Diabetes Mellitus  References:Diagnosis and Classification of Diabetes Mellitus,Diabetes JZPH,1505,69(VXYIA 1):S62-S69 and Standards of Medical Care in         Diabetes - 2011,Diabetes Care,2011,34  (Suppl 1):S11-S61.   Lab Results  Component Value Date   LDLCALC 52 11/07/2015   CREATININE 0.96 02/13/2017       Scheduled Meds: . alfuzosin  10 mg Oral Daily  . apixaban  5 mg Oral BID  . atorvastatin  80 mg Oral QPM  . feeding supplement (ENSURE ENLIVE)  237 mL Oral BID BM  . furosemide  40 mg Oral Daily  . metoprolol tartrate  25 mg Oral BID  . pantoprazole  40 mg Oral Daily  . potassium chloride SA  40 mEq Oral Daily   Continuous Infusions:   LOS: 0 days    Time spent: >30 MINS    Reyne Dumas  [facility] Pager [phone]. If 7PM-7AM, please contact night-coverage at [email or web address], password [facility] 02/13/2017, 8:40 AM  LOS: 0 days

## 2017-02-13 NOTE — Evaluation (Signed)
Physical Therapy Evaluation Patient Details Name: Blake [name] MRN: [id number] DOB: [birth year] Today's Date: 02/13/2017   History of Present Illness  70 y.o.malewith a past medical history significant for Afib on Eliquis, CAD s/p CABG in 2012, R rotator cuff sx 01/29/2017 who presents with dyspnea.  Clinical Impression  Patient evaluated by Physical Therapy with no further acute PT needs identified. All education has been completed and the patient has no further questions. Pt ambulated 200' with supervision as well as ascending/ descending a flight of steps with HR up to 120 bpm and O2 sats 96%.  See below for any follow-up Physical Therapy or equipment needs. PT is signing off. Thank you for this referral.     Follow Up Recommendations Outpatient PT    Equipment Recommendations  None recommended by PT    Recommendations for Other Services       Precautions / Restrictions Precautions Precautions: None Restrictions Weight Bearing Restrictions: Yes RUE Weight Bearing: Non weight bearing Other Position/Activity Restrictions: Per pt, RUE NWB since recent R rotator cuff repair.      Mobility  Bed Mobility               General bed mobility comments: pt up in chair  Transfers Overall transfer level: Needs assistance Equipment used: None Transfers: Sit to/from Stand Sit to Stand: Supervision         General transfer comment: pt stood safely from chair and returned to chair with good control and protection of RUE  Ambulation/Gait Ambulation/Gait assistance: Supervision Ambulation Distance (Feet): 200 Feet Assistive device: None Gait Pattern/deviations: WFL(Within Functional Limits) Gait velocity: WFL Gait velocity interpretation: at or above normal speed for age/gender General Gait Details: discussed altered balance with RUE sling, pt compensated well and was able to keep normal pace  Stairs Stairs: Yes Stairs assistance: Supervision Stair Management: One  rail Left;Alternating pattern;Forwards Number of Stairs: 10 General stair comments: pt has rail only on left so coming down he faces side/ bkwds. Practiced this way in stairwell and he was safe with sequencing. HR up to 120 after ascent, O2 sats 96%. Hr back down to upper 90's within 1 min rest  Wheelchair Mobility    Modified Rankin (Stroke Patients Only)       Balance Overall balance assessment: No apparent balance deficits (not formally assessed)                                           Pertinent Vitals/Pain Pain Assessment: No/denies pain    Home Living Family/patient expects to be discharged to:: Private residence Living Arrangements: Spouse/significant other Available Help at Discharge: Family Type of Home: House Home Access: Stairs to enter   Technical brewer of Steps: 3 Home Layout: Two level;Bed/bath upstairs Home Equipment: None      Prior Function Level of Independence: Needs assistance   Gait / Transfers Assistance Needed: Independent with ambulation  ADL's / Homemaking Assistance Needed: Wife assisting with ADL as needed since shoulder sx; prior to sx he was independent with ADL        Hand Dominance   Dominant Hand: Left    Extremity/Trunk Assessment   Upper Extremity Assessment Upper Extremity Assessment: Defer to OT evaluation RUE Deficits / Details: S/p recent R rotator cuff repair.    Lower Extremity Assessment Lower Extremity Assessment: Overall WFL for tasks assessed    Cervical / Trunk  Assessment Cervical / Trunk Assessment: Normal  Communication   Communication: No difficulties  Cognition Arousal/Alertness: Awake/alert Behavior During Therapy: WFL for tasks assessed/performed Overall Cognitive Status: Within Functional Limits for tasks assessed                                 General Comments: may have some STM issues but did not affect session and family not available to question       General Comments      Exercises     Assessment/Plan    PT Assessment Patent does not need any further PT services  PT Problem List         PT Treatment Interventions      PT Goals (Current goals can be found in the Care Plan section)  Acute Rehab PT Goals Patient Stated Goal: return home PT Goal Formulation: All assessment and education complete, DC therapy    Frequency     Barriers to discharge        Co-evaluation               End of Session   Activity Tolerance: Patient tolerated treatment well Patient left: in chair;with call bell/phone within reach Nurse Communication: Mobility status PT Visit Diagnosis: Other abnormalities of gait and mobility (R26.89)    Time: 1055-1110 PT Time Calculation (min) (ACUTE ONLY): 15 min   Charges:   PT Evaluation $PT Eval Moderate Complexity: 1 Procedure     PT G Codes:   PT G-Codes **NOT FOR INPATIENT CLASS** Functional Assessment Tool Used: AM-PAC 6 Clicks Basic Mobility Functional Limitation: Mobility: Walking and moving around Mobility: Walking and Moving Around Current Status (H7342): At least 20 percent but less than 40 percent impaired, limited or restricted Mobility: Walking and Moving Around Goal Status [phone]): At least 20 percent but less than 40 percent impaired, limited or restricted Mobility: Walking and Moving Around Discharge Status [phone]): At least 20 percent but less than 40 percent impaired, limited or restricted    [place], PT  Acute Rehab Services  [place] 02/13/2017, 2:08 PM

## 2017-02-13 NOTE — Evaluation (Signed)
Occupational Therapy Evaluation/Discharge Patient Details Name: Blake [name] MRN: [id number] DOB: [birth year] Today's Date: 02/13/2017    History of Present Illness 71 y.o.malewith a past medical history significant for Afib on Eliquis, CAD s/p CABG in 2012, R rotator cuff sx 01/29/2017 who presents with dyspnea.   Clinical Impression   Pt reports that since his recent R shoulder sx, his wife has been assisting some with ADL. Prior to that, he was independent with all ADL. Currently pt overall supervision for functional mobility and min assist for ADL. No unsteadiness or SOB noted with functional mobility in hallway; pt reports his breathing feels 95% resolved. Pt planning to d/c home with supervision/assist from his wife. Recommend return to outpatient PT/OT for continued rehab on R shoulder rotator cuff repair. No further acute OT needs identified; signing off at this time. Please re-consult if needs change. Thank you for this referral.    Follow Up Recommendations  Supervision - Intermittent;Other (comment) (return to outpatient PT/OT for R shoulder)    Equipment Recommendations  None recommended by OT    Recommendations for Other Services       Precautions / Restrictions Precautions Precautions: None Restrictions Weight Bearing Restrictions: Yes RUE Weight Bearing: Non weight bearing Other Position/Activity Restrictions: Per pt, RUE NWB since recent R rotator cuff repair.      Mobility Bed Mobility Overal bed mobility: Modified Independent             General bed mobility comments: HOB elevated. Coming out toward L side  Transfers Overall transfer level: Needs assistance Equipment used: None Transfers: Sit to/from Stand Sit to Stand: Supervision         General transfer comment: for safety; no physical assist required    Balance Overall balance assessment: No apparent balance deficits (not formally assessed)                                          ADL either performed or assessed with clinical judgement   ADL Overall ADL's : Needs assistance/impaired Eating/Feeding: Set up;Sitting   Grooming: Set up;Sitting   Upper Body Bathing: Minimal assistance;Sitting   Lower Body Bathing: Min guard;Sit to/from stand   Upper Body Dressing : Minimal assistance;Sitting Upper Body Dressing Details (indicate cue type and reason): to doff/don sling Lower Body Dressing: Minimal assistance;Sit to/from stand Lower Body Dressing Details (indicate cue type and reason): to adjust socks Toilet Transfer: Supervision/safety;Ambulation;Regular Toilet           Functional mobility during ADLs: Supervision/safety General ADL Comments: No SOB noted with functional mobility in hallway. Discussed continued mobililty with RN staff or wife throughout the day.     Vision         Perception     Praxis      Pertinent Vitals/Pain Pain Assessment: Faces Faces Pain Scale: Hurts little more Pain Location: R shoulder Pain Descriptors / Indicators: Discomfort;Sore Pain Intervention(s): Monitored during session;Repositioned     Hand Dominance Left   Extremity/Trunk Assessment Upper Extremity Assessment Upper Extremity Assessment: RUE deficits/detail RUE Deficits / Details: S/p recent R rotator cuff repair. RUE: Unable to fully assess due to immobilization   Lower Extremity Assessment Lower Extremity Assessment: Defer to PT evaluation   Cervical / Trunk Assessment Cervical / Trunk Assessment: Normal   Communication Communication Communication: No difficulties   Cognition Arousal/Alertness: Awake/alert Behavior During Therapy: WFL for tasks assessed/performed Overall  Cognitive Status: Within Functional Limits for tasks assessed                                     General Comments       Exercises     Shoulder Instructions      Home Living Family/patient expects to be discharged to:: Private  residence Living Arrangements: Spouse/significant other Available Help at Discharge: Family Type of Home: House Home Access: Stairs to enter Technical brewer of Steps: 3   Home Layout: Two level;Bed/bath upstairs Alternate Level Stairs-Number of Steps: flight   Bathroom Shower/Tub: Occupational psychologist: Standard     Home Equipment: None          Prior Functioning/Environment Level of Independence: Needs assistance  Gait / Transfers Assistance Needed: Independent with ambulation ADL's / Homemaking Assistance Needed: Wife assisting with ADL as needed since shoulder sx; prior to sx he was independent with ADL            OT Problem List: Decreased strength;Decreased range of motion;Pain      OT Treatment/Interventions:      OT Goals(Current goals can be found in the care plan section) Acute Rehab OT Goals Patient Stated Goal: return home OT Goal Formulation: All assessment and education complete, DC therapy  OT Frequency:     Barriers to D/C:            Co-evaluation              End of Session Equipment Utilized During Treatment: Other (comment) (sling) Nurse Communication: Mobility status  Activity Tolerance: Patient tolerated treatment well Patient left: in chair;with call bell/phone within reach  OT Visit Diagnosis: Pain Pain - Right/Left: Right Pain - part of body: Shoulder                Time: 3614-4315 OT Time Calculation (min): 16 min Charges:  OT General Charges $OT Visit: 1 Procedure OT Evaluation $OT Eval Moderate Complexity: 1 Procedure G-Codes: OT G-codes **NOT FOR INPATIENT CLASS** Functional Assessment Tool Used: Clinical judgement Functional Limitation: Self care Self Care Current Status (Q0086): At least 1 percent but less than 20 percent impaired, limited or restricted Self Care Goal Status (P6195): At least 1 percent but less than 20 percent impaired, limited or restricted Self Care Discharge Status [phone]): At  least 1 percent but less than 20 percent impaired, limited or restricted   Mel Almond A. Ulice Brilliant, M.S., OTR/L Pager: [place] 02/13/2017, 9:27 AM

## 2017-02-13 NOTE — Progress Notes (Signed)
DAILY PROGRESS NOTE   Patient Name: Blake [name] Date of Encounter: 02/13/2017  Hospital Problem List   Principal Problem:   Dyspnea Active Problems:   Atrial fibrillation, chronic (HCC)   Coronary artery disease due to lipid rich plaque   Community acquired pneumonia of left lower lobe of lung ([place])   Acute diastolic CHF (congestive heart failure) ([place])    Chief Complaint   Did not sleep well overnight - was in a-fib with RVR, rate now improved after diltiazem  Subjective   Diuresed a total of 3.5L negative - appears dry today. Creatinine stable. HR around 110. BP low normal. Echo yesterday shows LVEF 55-60%, moderate LAE, moderate to severe MR, trivial AI.   Objective   Vitals:   02/12/17 1316 02/12/17 2028 02/13/17 0318 02/13/17 0808  BP: (!) 106/52 113/70 107/68 100/60  Pulse: 76 98 94 (!) 137  Resp: _0 Temp: 97.9 F (36.6 C) 98.6 F (37 C) 97.8 F (36.6 C)   TempSrc: Oral Oral Oral   SpO2: 100% 97% 97%   Weight:   191 lb 4.8 oz (86.8 kg)   Height:        Intake/Output Summary (Last 24 hours) at 02/13/17 1043 Last data filed at 02/13/17 0800  Gross per 24 hour  Intake              960 ml  Output             3550 ml  Net            -2590 ml   Filed Weights   02/12/17 0047 02/12/17 0356 02/13/17 0318  Weight: 200 lb (90.7 kg) 196 lb 8 oz (89.1 kg) 191 lb 4.8 oz (86.8 kg)    Physical Exam   General appearance: alert and no distress Lungs: clear to auscultation bilaterally Heart: irregularly irregular rhythm and systolic murmur: holosystolic 3/6, blowing at apex Extremities: extremities normal, atraumatic, no cyanosis or edema Neurologic: Grossly normal  Inpatient Medications    Scheduled Meds: . alfuzosin  10 mg Oral Daily  . apixaban  5 mg Oral BID  . atorvastatin  80 mg Oral QPM  . feeding supplement (ENSURE ENLIVE)  237 mL Oral BID BM  . furosemide  40 mg Oral Daily  . metoprolol tartrate  25 mg Oral BID  . pantoprazole  40 mg  Oral Daily  . potassium chloride SA  40 mEq Oral Daily    Continuous Infusions:   PRN Meds: acetaminophen **OR** acetaminophen, ondansetron **OR** ondansetron (ZOFRAN) IV   Labs   Results for orders placed or performed during the hospital encounter of 02/12/17 (from the past 48 hour(s))  Basic metabolic panel     Status: None   Collection Time: 02/12/17 12:51 AM  Result Value Ref Range   Sodium 138 135 - 145 mmol/L   Potassium 3.6 3.5 - 5.1 mmol/L   Chloride 103 101 - 111 mmol/L   CO2 23 22 - 32 mmol/L   Glucose, Bld 90 65 - 99 mg/dL   BUN 17 6 - 20 mg/dL   Creatinine, Ser 0.96 0.61 - 1.24 mg/dL   Calcium 9.3 8.9 - 10.3 mg/dL   GFR calc non Af Amer >60 >60 mL/min   GFR calc Af Amer >60 >60 mL/min    Comment: (NOTE) The eGFR has been calculated using the CKD EPI equation. This calculation has not been validated in all clinical situations. eGFR's persistently <60 mL/min signify possible  Chronic Kidney Disease.    Anion gap 12 5 - 15  CBC with Differential/Platelet     Status: Abnormal   Collection Time: 02/12/17 12:51 AM  Result Value Ref Range   WBC 11.0 (H) 4.0 - 10.5 K/uL   RBC 4.02 (L) 4.22 - 5.81 MIL/uL   Hemoglobin 12.7 (L) 13.0 - 17.0 g/dL   HCT 38.2 (L) 39.0 - 52.0 %   MCV 95.0 78.0 - 100.0 fL   MCH 31.6 26.0 - 34.0 pg   MCHC 33.2 30.0 - 36.0 g/dL   RDW 13.1 11.5 - 15.5 %   Platelets 290 150 - 400 K/uL   Neutrophils Relative % 54 %   Neutro Abs 6.0 1.7 - 7.7 K/uL   Lymphocytes Relative 30 %   Lymphs Abs 3.3 0.7 - 4.0 K/uL   Monocytes Relative 13 %   Monocytes Absolute 1.5 (H) 0.1 - 1.0 K/uL   Eosinophils Relative 2 %   Eosinophils Absolute 0.2 0.0 - 0.7 K/uL   Basophils Relative 1 %   Basophils Absolute 0.1 0.0 - 0.1 K/uL  Troponin I     Status: None   Collection Time: 02/12/17 12:51 AM  Result Value Ref Range   Troponin I <0.03 <0.03 ng/mL  Brain natriuretic peptide     Status: Abnormal   Collection Time: 02/12/17 12:54 AM  Result Value Ref Range    B Natriuretic Peptide 109.2 (H) 0.0 - 100.0 pg/mL  I-Stat CG4 Lactic Acid, ED     Status: Abnormal   Collection Time: 02/12/17  1:38 AM  Result Value Ref Range   Lactic Acid, Venous 2.35 (HH) 0.5 - 1.9 mmol/L  I-Stat CG4 Lactic Acid, ED     Status: None   Collection Time: 02/12/17  1:50 AM  Result Value Ref Range   Lactic Acid, Venous 1.18 0.5 - 1.9 mmol/L  Magnesium     Status: None   Collection Time: 02/12/17  4:00 AM  Result Value Ref Range   Magnesium 2.1 1.7 - 2.4 mg/dL  Procalcitonin - Baseline     Status: None   Collection Time: 02/12/17  4:28 AM  Result Value Ref Range   Procalcitonin <0.10 ng/mL    Comment:        Interpretation: PCT (Procalcitonin) <= 0.5 ng/mL: Systemic infection (sepsis) is not likely. Local bacterial infection is possible. (NOTE)         ICU PCT Algorithm               Non ICU PCT Algorithm    ----------------------------     ------------------------------         PCT < 0.25 ng/mL                 PCT < 0.1 ng/mL     Stopping of antibiotics            Stopping of antibiotics       strongly encouraged.               strongly encouraged.    ----------------------------     ------------------------------       PCT level decrease by               PCT < 0.25 ng/mL       >= 80% from peak PCT       OR PCT 0.25 - 0.5 ng/mL          Stopping of antibiotics  encouraged.     Stopping of antibiotics           encouraged.    ----------------------------     ------------------------------       PCT level decrease by              PCT >= 0.25 ng/mL       < 80% from peak PCT        AND PCT >= 0.5 ng/mL            Continuin g antibiotics                                              encouraged.       Continuing antibiotics            encouraged.    ----------------------------     ------------------------------     PCT level increase compared          PCT > 0.5 ng/mL         with peak PCT AND          PCT >= 0.5 ng/mL              Escalation of antibiotics                                          strongly encouraged.      Escalation of antibiotics        strongly encouraged.   CBC     Status: Abnormal   Collection Time: 02/12/17  4:28 AM  Result Value Ref Range   WBC 10.2 4.0 - 10.5 K/uL   RBC 3.76 (L) 4.22 - 5.81 MIL/uL   Hemoglobin 12.0 (L) 13.0 - 17.0 g/dL   HCT 36.1 (L) 39.0 - 52.0 %   MCV 96.0 78.0 - 100.0 fL   MCH 31.9 26.0 - 34.0 pg   MCHC 33.2 30.0 - 36.0 g/dL   RDW 13.3 11.5 - 15.5 %   Platelets 279 150 - 400 K/uL  Strep pneumoniae urinary antigen     Status: None   Collection Time: 02/12/17  5:58 AM  Result Value Ref Range   Strep Pneumo Urinary Antigen NEGATIVE NEGATIVE    Comment:        Infection due to S. pneumoniae cannot be absolutely ruled out since the antigen present may be below the detection limit of the test.   Basic metabolic panel     Status: Abnormal   Collection Time: 02/13/17  4:32 AM  Result Value Ref Range   Sodium 139 135 - 145 mmol/L   Potassium 3.8 3.5 - 5.1 mmol/L   Chloride 104 101 - 111 mmol/L   CO2 24 22 - 32 mmol/L   Glucose, Bld 105 (H) 65 - 99 mg/dL   BUN 24 (H) 6 - 20 mg/dL   Creatinine, Ser 0.96 0.61 - 1.24 mg/dL   Calcium 9.4 8.9 - 10.3 mg/dL   GFR calc non Af Amer >60 >60 mL/min   GFR calc Af Amer >60 >60 mL/min    Comment: (NOTE) The eGFR has been calculated using the CKD EPI equation. This calculation has not been validated in all clinical situations. eGFR's persistently <60 mL/min signify possible Chronic Kidney Disease.  Anion gap 11 5 - 15    ECG   A-fib with RVR - Personally Reviewed  Telemetry   A-fib  - Personally Reviewed  Radiology    Dg Chest Portable 1 View  Result Date: 02/12/2017 CLINICAL DATA:  Acute onset of worsening shortness of breath. Initial encounter. EXAM: PORTABLE CHEST 1 VIEW COMPARISON:  Chest radiograph performed 06/28/2016 FINDINGS: The lungs are well-aerated. Mild left basilar opacity could reflect  mild infection, depending on the patient's symptoms. No pleural effusion or pneumothorax is seen. The cardiomediastinal silhouette is normal in size. The patient is status post median sternotomy, with evidence of prior CABG. No acute osseous abnormalities are seen. The patient is status post right-sided rotator cuff repair. IMPRESSION: Mild left basilar airspace opacity may reflect mild infection, depending on the patient's symptoms. Electronically Signed   By: Garald Balding M.D.   On: 02/12/2017 01:19    Cardiac Studies   LV EF: 55% -   60%  ------------------------------------------------------------------- Indications:      Congestive heart failure 428.1.  ------------------------------------------------------------------- History:   PMH:   Atrial fibrillation.  Coronary artery disease. Risk factors:  Dyslipidemia.  ------------------------------------------------------------------- Study Conclusions  - Left ventricle: The cavity size was normal. Wall thickness was   increased in a pattern of mild LVH. Systolic function was normal.   The estimated ejection fraction was in the range of 55% to 60%.   Wall motion was normal; there were no regional wall motion   abnormalities. - Aortic valve: There was trivial regurgitation. - Mitral valve: Calcified annulus. There was moderate to severe   regurgitation. - Left atrium: The atrium was moderately dilated.  Impressions:  - Normal LV systolic function; trace AI; moderate to severe MR;   moderate LAE.  Assessment   1. Principal Problem: 2.   Dyspnea 3. Active Problems: 4.   Atrial fibrillation, chronic ([place]) 5.   Coronary artery disease due to lipid rich plaque 6.   Community acquired pneumonia of left lower lobe of lung ([place]) 7.   Acute diastolic CHF (congestive heart failure) ([place]) 8.   Plan   1. May be somewhat overdiuresed at this point. Echo shows moderate to severe MR - moderate LAE, normal LV function. MR may be  the cause of his decompensation. Switched to po lasix. Needs better rate control. Increase metoprolol to 37.5 mg BID. Give an extra 12.5 mg now. Will give 500 cc fluid back. If rate improved, ok for d/c home later today from my standpoint. We have already arranged follow-up with Cecilie Kicks, FNP on 4/27.  Time Spent Directly with Patient:  15 minutes  Length of Stay:  LOS: 0 days   Pixie Casino, MD, [place]  Attending Cardiologist  Direct Dial: [phone]  Fax: [phone]  Website:  [email or web address] 02/13/2017, 10:43 AM

## 2017-02-13 NOTE — Discharge Summary (Signed)
Physician Discharge Summary  Blake [name] MRN: [id number] DOB/AGE: [birth year] 71 y.o.  PCP: Scarlette Calico, MD   Admit date: 02/12/2017 Discharge date: 02/13/2017  Discharge Diagnoses:    Principal Problem:   Dyspnea Active Problems:   Atrial fibrillation, chronic (HCC)   Coronary artery disease due to lipid rich plaque   Community acquired pneumonia of left lower lobe of lung (HCC)   Acute diastolic CHF (congestive heart failure) ([place])   Mitral valve insufficiency    Follow-up recommendations Follow-up with PCP in 3-5 days , including all  additional recommended appointments as below Follow-up CBC, CMP in 3-5 days Ok to dc per Pixie Casino, MD. follow-up with Cecilie Kicks, FNP on 4/27.      Current Discharge Medication List    START taking these medications   Details  furosemide (LASIX) 40 MG tablet Take 1 tablet (40 mg total) by mouth daily. Qty: 30 tablet, Refills: 1      CONTINUE these medications which have CHANGED   Details  metoprolol tartrate 37.5 MG TABS Take 37.5 mg by mouth 2 (two) times daily. Qty: 60 tablet, Refills: 2      CONTINUE these medications which have NOT CHANGED   Details  alfuzosin (UROXATRAL) 10 MG 24 hr tablet Take 10 mg by mouth daily.     apixaban (ELIQUIS) 5 MG TABS tablet Take 1 tablet (5 mg total) by mouth 2 (two) times daily. Qty: 180 tablet, Refills: 3    atorvastatin (LIPITOR) 80 MG tablet Take 80 mg by mouth every evening.    pantoprazole (PROTONIX) 40 MG tablet Take 1 tablet (40 mg total) by mouth daily. Qty: 90 tablet, Refills: 3    potassium chloride SA (K-DUR,KLOR-CON) 20 MEQ tablet Take 1 tablet (20 mEq total) by mouth daily. Qty: 35 tablet, Refills: 1    traMADol (ULTRAM) 50 MG tablet Take 1 tablet (50 mg total) by mouth 2 (two) times daily between meals as needed. Qty: 30 tablet, Refills: 0   Associated Diagnoses: Chronic right shoulder pain         Discharge Condition: stable  Discharge  Instructions Get Medicines reviewed and adjusted: Please take all your medications with you for your next visit with your Primary MD  Please request your Primary MD to go over all hospital tests and procedure/radiological results at the follow up, please ask your Primary MD to get all Hospital records sent to his/her office.  If you experience worsening of your admission symptoms, develop shortness of breath, life threatening emergency, suicidal or homicidal thoughts you must seek medical attention immediately by calling 911 or calling your MD immediately if symptoms less severe.  You must read complete instructions/literature along with all the possible adverse reactions/side effects for all the Medicines you take and that have been prescribed to you. Take any new Medicines after you have completely understood and accpet all the possible adverse reactions/side effects.   Do not drive when taking Pain medications.   Do not take more than prescribed Pain, Sleep and Anxiety Medications  Special Instructions: If you have smoked or chewed Tobacco in the last 2 yrs please stop smoking, stop any regular Alcohol and or any Recreational drug use.  Wear Seat belts while driving.  Please note  You were cared for by a hospitalist during your hospital stay. Once you are discharged, your primary care physician will handle any further medical issues. Please note that NO REFILLS for any discharge medications will be authorized once you are discharged,  as it is imperative that you return to your primary care physician (or establish a relationship with a primary care physician if you do not have one) for your aftercare needs so that they can reassess your need for medications and monitor your lab values.  Discharge Instructions    Diet - low sodium heart healthy    Complete by:  As directed    Increase activity slowly    Complete by:  As directed        No Known Allergies    Disposition: 01-Home  or Self Care   Consults:  cardiology    Significant Diagnostic Studies:  Dg Chest Portable 1 View  Result Date: 02/12/2017 CLINICAL DATA:  Acute onset of worsening shortness of breath. Initial encounter. EXAM: PORTABLE CHEST 1 VIEW COMPARISON:  Chest radiograph performed 06/28/2016 FINDINGS: The lungs are well-aerated. Mild left basilar opacity could reflect mild infection, depending on the patient's symptoms. No pleural effusion or pneumothorax is seen. The cardiomediastinal silhouette is normal in size. The patient is status post median sternotomy, with evidence of prior CABG. No acute osseous abnormalities are seen. The patient is status post right-sided rotator cuff repair. IMPRESSION: Mild left basilar airspace opacity may reflect mild infection, depending on the patient's symptoms. Electronically Signed   By: Garald Balding M.D.   On: 02/12/2017 01:19        Filed Weights   02/12/17 0047 02/12/17 0356 02/13/17 0318  Weight: 90.7 kg (200 lb) 89.1 kg (196 lb 8 oz) 86.8 kg (191 lb 4.8 oz)     Microbiology: No results found for this or any previous visit (from the past 240 hour(s)).     Blood Culture    Component Value Date/Time   SDES URINE, RANDOM 10/18/2015 0700   SPECREQUEST NONE 10/18/2015 0700   CULT 9,000 COLONIES/mL INSIGNIFICANT GROWTH 10/18/2015 0700   REPTSTATUS 10/19/2015 FINAL 10/18/2015 0700      Labs: Results for orders placed or performed during the hospital encounter of 02/12/17 (from the past 48 hour(s))  Basic metabolic panel     Status: None   Collection Time: 02/12/17 12:51 AM  Result Value Ref Range   Sodium 138 135 - 145 mmol/L   Potassium 3.6 3.5 - 5.1 mmol/L   Chloride 103 101 - 111 mmol/L   CO2 23 22 - 32 mmol/L   Glucose, Bld 90 65 - 99 mg/dL   BUN 17 6 - 20 mg/dL   Creatinine, Ser 0.96 0.61 - 1.24 mg/dL   Calcium 9.3 8.9 - 10.3 mg/dL   GFR calc non Af Amer >60 >60 mL/min   GFR calc Af Amer >60 >60 mL/min    Comment: (NOTE) The eGFR  has been calculated using the CKD EPI equation. This calculation has not been validated in all clinical situations. eGFR's persistently <60 mL/min signify possible Chronic Kidney Disease.    Anion gap 12 5 - 15  CBC with Differential/Platelet     Status: Abnormal   Collection Time: 02/12/17 12:51 AM  Result Value Ref Range   WBC 11.0 (H) 4.0 - 10.5 K/uL   RBC 4.02 (L) 4.22 - 5.81 MIL/uL   Hemoglobin 12.7 (L) 13.0 - 17.0 g/dL   HCT 38.2 (L) 39.0 - 52.0 %   MCV 95.0 78.0 - 100.0 fL   MCH 31.6 26.0 - 34.0 pg   MCHC 33.2 30.0 - 36.0 g/dL   RDW 13.1 11.5 - 15.5 %   Platelets 290 150 - 400 K/uL   Neutrophils  Relative % 54 %   Neutro Abs 6.0 1.7 - 7.7 K/uL   Lymphocytes Relative 30 %   Lymphs Abs 3.3 0.7 - 4.0 K/uL   Monocytes Relative 13 %   Monocytes Absolute 1.5 (H) 0.1 - 1.0 K/uL   Eosinophils Relative 2 %   Eosinophils Absolute 0.2 0.0 - 0.7 K/uL   Basophils Relative 1 %   Basophils Absolute 0.1 0.0 - 0.1 K/uL  Troponin I     Status: None   Collection Time: 02/12/17 12:51 AM  Result Value Ref Range   Troponin I <0.03 <0.03 ng/mL  Brain natriuretic peptide     Status: Abnormal   Collection Time: 02/12/17 12:54 AM  Result Value Ref Range   B Natriuretic Peptide 109.2 (H) 0.0 - 100.0 pg/mL  I-Stat CG4 Lactic Acid, ED     Status: Abnormal   Collection Time: 02/12/17  1:38 AM  Result Value Ref Range   Lactic Acid, Venous 2.35 (HH) 0.5 - 1.9 mmol/L  I-Stat CG4 Lactic Acid, ED     Status: None   Collection Time: 02/12/17  1:50 AM  Result Value Ref Range   Lactic Acid, Venous 1.18 0.5 - 1.9 mmol/L  Magnesium     Status: None   Collection Time: 02/12/17  4:00 AM  Result Value Ref Range   Magnesium 2.1 1.7 - 2.4 mg/dL  Procalcitonin - Baseline     Status: None   Collection Time: 02/12/17  4:28 AM  Result Value Ref Range   Procalcitonin <0.10 ng/mL    Comment:        Interpretation: PCT (Procalcitonin) <= 0.5 ng/mL: Systemic infection (sepsis) is not likely. Local bacterial  infection is possible. (NOTE)         ICU PCT Algorithm               Non ICU PCT Algorithm    ----------------------------     ------------------------------         PCT < 0.25 ng/mL                 PCT < 0.1 ng/mL     Stopping of antibiotics            Stopping of antibiotics       strongly encouraged.               strongly encouraged.    ----------------------------     ------------------------------       PCT level decrease by               PCT < 0.25 ng/mL       >= 80% from peak PCT       OR PCT 0.25 - 0.5 ng/mL          Stopping of antibiotics                                             encouraged.     Stopping of antibiotics           encouraged.    ----------------------------     ------------------------------       PCT level decrease by              PCT >= 0.25 ng/mL       < 80% from peak PCT        AND PCT >=  0.5 ng/mL            Continuin g antibiotics                                              encouraged.       Continuing antibiotics            encouraged.    ----------------------------     ------------------------------     PCT level increase compared          PCT > 0.5 ng/mL         with peak PCT AND          PCT >= 0.5 ng/mL             Escalation of antibiotics                                          strongly encouraged.      Escalation of antibiotics        strongly encouraged.   CBC     Status: Abnormal   Collection Time: 02/12/17  4:28 AM  Result Value Ref Range   WBC 10.2 4.0 - 10.5 K/uL   RBC 3.76 (L) 4.22 - 5.81 MIL/uL   Hemoglobin 12.0 (L) 13.0 - 17.0 g/dL   HCT 36.1 (L) 39.0 - 52.0 %   MCV 96.0 78.0 - 100.0 fL   MCH 31.9 26.0 - 34.0 pg   MCHC 33.2 30.0 - 36.0 g/dL   RDW 13.3 11.5 - 15.5 %   Platelets 279 150 - 400 K/uL  Strep pneumoniae urinary antigen     Status: None   Collection Time: 02/12/17  5:58 AM  Result Value Ref Range   Strep Pneumo Urinary Antigen NEGATIVE NEGATIVE    Comment:        Infection due to S. pneumoniae cannot be  absolutely ruled out since the antigen present may be below the detection limit of the test.   Basic metabolic panel     Status: Abnormal   Collection Time: 02/13/17  4:32 AM  Result Value Ref Range   Sodium 139 135 - 145 mmol/L   Potassium 3.8 3.5 - 5.1 mmol/L   Chloride 104 101 - 111 mmol/L   CO2 24 22 - 32 mmol/L   Glucose, Bld 105 (H) 65 - 99 mg/dL   BUN 24 (H) 6 - 20 mg/dL   Creatinine, Ser 0.96 0.61 - 1.24 mg/dL   Calcium 9.4 8.9 - 10.3 mg/dL   GFR calc non Af Amer >60 >60 mL/min   GFR calc Af Amer >60 >60 mL/min    Comment: (NOTE) The eGFR has been calculated using the CKD EPI equation. This calculation has not been validated in all clinical situations. eGFR's persistently <60 mL/min signify possible Chronic Kidney Disease.    Anion gap 11 5 - 15     Lipid Panel     Component Value Date/Time   CHOL 98 11/07/2015 1643   TRIG 58.0 11/07/2015 1643   HDL 33.90 (L) 11/07/2015 1643   CHOLHDL 3 11/07/2015 1643   VLDL 11.6 11/07/2015 1643   LDLCALC 52 11/07/2015 1643     Lab Results  Component Value Date   HGBA1C 5.8 11/07/2015   HGBA1C 5.9 12/11/2013  HGBA1C  03/23/2011    5.5 (NOTE)                                                                       According to the ADA Clinical Practice Recommendations for 2011, when HbA1c is used as a screening test:   >=6.5%   Diagnostic of Diabetes Mellitus           (if abnormal result  is confirmed)  5.7-6.4%   Increased risk of developing Diabetes Mellitus  References:Diagnosis and Classification of Diabetes Mellitus,Diabetes BZJI,9678,93(YBOFB 1):S62-S69 and Standards of Medical Care in         Diabetes - 2011,Diabetes PZWC,5852,77  (Suppl 1):S11-S61.       HPI :  71 y.o.malewith a past medical history significant for Afib on Eliquis, CAD s/p CABG in 2012who presents with dyspnea.The patient was in his usual state of health until 2 weeks ago when he underwent outpatient rotator cuff surgery.  .Over the past several  days he became particular more short of breath and was noted to have some increasing lower extremity swelling. He was seen in the office by cards , who felt he was in decompensated heart failure and start him on a diuretic. 4/24  his shortness of breath worsened and he presented to the emergency department. He responded initially to IV Lasix with a brisk diuresis and feels that his breathing is improved significantly but not at [place]:    1. Ruled out for CAP:  atypical presentation given absence of fever, WBC, cough. PE doubted. CHF considered. PSI score would be 72. STOPPED  ceftriaxone and azithromycin for now procalcitonin <0.10 Wbc nl Strep pneumo antigen negative    2. Acute on chronic diastolic  CHF due to MR :  Denies history of CHF and does not take Lasix daily at baseline. However, elevated BNP, 10 lbs weight gain, salt intake up, leg swelling, JVP visible. received IV lasix ,now on Furosemide 40 mg PO per cards after receiving 500 cc NS bolus on 4/25 for suspected over diuresis ,will resume lasix tomorrow  -K Supplemented - Cardiology  ok for d/c home later today .already arranged follow-up with Cecilie Kicks, FNP       3. Atrial fibrillation with rapid ventricular response: Heart rate uncontrolled , HR in 110-120's  CHADS2Vasc 2. On Eliquis and BB -Continue Eliquis -Continue BB at lowest dose, patient received 1 dose of IV Cardizem push Discussed with Dr. Debara Pickett, blood pressure soft May be somewhat overdiuresed at this point. Echo shows moderate to severe MR - moderate LAE, normal LV function. MR may be the cause of his decompensation.Increased metoprolol to 37.5 mg BID.OK to dc per Dr Buren Kos  4. CAD: No chest pain. Continue statin, BB  5. Other medications: -Continue alfuzosin -Continue PPI    Discharge Exam:  Blood pressure 108/67, pulse (!) 115, temperature 97.8 F (36.6 C), temperature source Oral, resp. rate 18, height '5\' 6"'$   (1.676 m), weight 86.8 kg (191 lb 4.8 oz), SpO2 96 %.  General appearance: alert and no distress Lungs: clear to auscultation bilaterally Heart: irregularly irregular rhythm and systolic murmur: holosystolic 3/6, blowing at apex Extremities: extremities normal, atraumatic, no cyanosis or edema Neurologic: Grossly normal  Follow-up Information    Scarlette Calico, MD. Call.   Specialty:  Internal Medicine Why:  hospital follow up Contact information: [address] [address] [phone]        Cecilie Kicks, NP. Call.   Specialties:  Cardiology, Radiology Why:  to set up follow within one week Contact information: [address] [place] [address] [phone]           Signed: Reyne Dumas 02/13/2017, 1:47 PM        Time spent >45 mins

## 2017-02-14 ENCOUNTER — Ambulatory Visit: Payer: Medicare Other | Admitting: Physical Therapy

## 2017-02-14 ENCOUNTER — Telehealth: Payer: Self-pay | Admitting: Internal Medicine

## 2017-02-14 ENCOUNTER — Telehealth: Payer: Self-pay | Admitting: *Deleted

## 2017-02-14 NOTE — Telephone Encounter (Signed)
Spoke with pt, Aware of dr crenshaw's recommendations.  °

## 2017-02-14 NOTE — Telephone Encounter (Signed)
Await fu appt with pa tomorrow Kirk Ruths

## 2017-02-14 NOTE — Telephone Encounter (Signed)
This is a patient of Dr. Stanford Breed who saw L.Dorene Ar, NP 4/23 and sees her on 02/15/17.

## 2017-02-14 NOTE — Telephone Encounter (Signed)
Transition Care Management Follow-up Telephone Call   Date discharged? 02/13/17   How have you been since you were released from the hospital? Pt states he is doing alright   Do you understand why you were in the hospital? YES   Do you understand the discharge instructions? YES   Where were you discharged to? Home   Items Reviewed:  Medications reviewed: YES  Allergies reviewed: YES  Dietary changes reviewed: NO  Referrals reviewed: No referral    Functional Questionnaire:   Activities of Daily Living (ADLs):   He states he are independent in the following: ambulation, bathing and hygiene, feeding, continence, grooming, toileting and dressing States he doesn't require assistance    Any transportation issues/concerns?: YES   Any patient concerns? YES   Confirmed importance and date/time of follow-up visits scheduled YES, appt   Provider Appointment booked with  Confirmed with patient if condition begins to worsen call PCP or go to the ER.  Patient was given the office number and encouraged to call back with question or concerns.  : YES

## 2017-02-14 NOTE — Progress Notes (Signed)
Pt. called yesterday about needing a Heart Failure booklet booklet. Pt. Is to come today to receive booklet.

## 2017-02-14 NOTE — Telephone Encounter (Signed)
Pt needs a release to go back to physicall therapy asap. He said Dr Debara Pickett saw him in the hospital and would be the best one to release him. He said to please call him and he will give you all the details.

## 2017-02-15 ENCOUNTER — Ambulatory Visit (INDEPENDENT_AMBULATORY_CARE_PROVIDER_SITE_OTHER): Payer: Medicare Other | Admitting: Cardiology

## 2017-02-15 ENCOUNTER — Encounter: Payer: Self-pay | Admitting: Cardiology

## 2017-02-15 ENCOUNTER — Telehealth: Payer: Self-pay

## 2017-02-15 VITALS — BP 108/76 | HR 92 | Ht 69.0 in | Wt 190.8 lb

## 2017-02-15 DIAGNOSIS — I482 Chronic atrial fibrillation: Secondary | ICD-10-CM

## 2017-02-15 DIAGNOSIS — R609 Edema, unspecified: Secondary | ICD-10-CM

## 2017-02-15 DIAGNOSIS — Z7901 Long term (current) use of anticoagulants: Secondary | ICD-10-CM

## 2017-02-15 DIAGNOSIS — Z79899 Other long term (current) drug therapy: Secondary | ICD-10-CM

## 2017-02-15 DIAGNOSIS — I5043 Acute on chronic combined systolic (congestive) and diastolic (congestive) heart failure: Secondary | ICD-10-CM | POA: Diagnosis not present

## 2017-02-15 DIAGNOSIS — I2510 Atherosclerotic heart disease of native coronary artery without angina pectoris: Secondary | ICD-10-CM | POA: Diagnosis not present

## 2017-02-15 DIAGNOSIS — I4821 Permanent atrial fibrillation: Secondary | ICD-10-CM

## 2017-02-15 LAB — COMPREHENSIVE METABOLIC PANEL
ALK PHOS: 99 IU/L (ref 39–117)
ALT: 12 IU/L (ref 0–44)
AST: 14 IU/L (ref 0–40)
Albumin/Globulin Ratio: 1.5 (ref 1.2–2.2)
Albumin: 4.3 g/dL (ref 3.5–4.8)
BILIRUBIN TOTAL: 1.8 mg/dL — AB (ref 0.0–1.2)
BUN / CREAT RATIO: 29 — AB (ref 10–24)
BUN: 30 mg/dL — AB (ref 8–27)
CO2: 23 mmol/L (ref 18–29)
Calcium: 9.8 mg/dL (ref 8.6–10.2)
Chloride: 104 mmol/L (ref 96–106)
Creatinine, Ser: 1.05 mg/dL (ref 0.76–1.27)
GFR calc non Af Amer: 71 mL/min/{1.73_m2} (ref >59)
GFR, EST AFRICAN AMERICAN: 82 mL/min/{1.73_m2} (ref >59)
GLUCOSE: 110 mg/dL — AB (ref 65–99)
Globulin, Total: 2.9 g/dL (ref 1.5–4.5)
POTASSIUM: 4.2 mmol/L (ref 3.5–5.2)
SODIUM: 137 mmol/L (ref 134–144)
TOTAL PROTEIN: 7.2 g/dL (ref 6.0–8.5)

## 2017-02-15 LAB — CBC
Hematocrit: 40.7 % (ref 37.5–51.0)
Hemoglobin: 14.2 g/dL (ref 13.0–17.7)
MCH: 31.1 pg (ref 26.6–33.0)
MCHC: 34.9 g/dL (ref 31.5–35.7)
MCV: 89 fL (ref 79–97)
PLATELETS: 369 10*3/uL (ref 150–379)
RBC: 4.56 x10E6/uL (ref 4.14–5.80)
RDW: 13.4 % (ref 12.3–15.4)
WBC: 10.4 10*3/uL (ref 3.4–10.8)

## 2017-02-15 MED ORDER — METOPROLOL TARTRATE 25 MG PO TABS: 37.5000 mg | ORAL_TABLET | Freq: Two times a day (BID) | ORAL | 3 refills | Status: DC

## 2017-02-15 NOTE — Patient Instructions (Signed)
Medication Instructions:  None Ordered   Labwork: Your physician recommends that you return for lab work in: CMET, CBC  Testing/Procedures: None Ordered   Follow-Up: Your physician recommends that you schedule a follow-up appointment in: May with Dr. Stanford Breed  Any Other Special Instructions Will Be Listed Below (If Applicable).     If you need a refill on your cardiac medications before your next appointment, please call your pharmacy.

## 2017-02-15 NOTE — Telephone Encounter (Signed)
Called patient with lab results.  Patient coming in for repeat lab work on 03/04/17, per Cecilie Kicks NP.

## 2017-02-15 NOTE — Progress Notes (Signed)
Cardiology Office Note   Date:  02/15/2017   ID:  Blake [name] 1946/09/29, MRN [id number]  PCP:  Blake Calico, MD  Cardiologist:  Dr. Stanford [name]    Chief Complaint  Patient presents with  . Hospitalization Follow-up      History of Present Illness: Blake [name] is a 71 y.o. male who presents for CHF post hospital.  I saw earlier this week for HF with lower ext edema.  That night his breathing became more difficult and he went to ER, initially treated for PNA and HF.   He was diuresed neg 3.5 L  Echo was done and EF 55-60% moderate LAE and mod to severe MR.  He has permanent atrial fib.  Wt dropped 9 lbs.  He was given 500 cc IV fluids prior to discharge due to HR 137.  His BB increased to 37.5 BID (we had decreased to give lasix with the BP in office of 88 systolic.)        CAD and atrial fib.  Hx of CAD with CABG in 2012 with LIMA to LAD, VG to 1st diag, and VG to acute marginal.  She did have post op a fib.    Last echo EF 60%, mild LVH.  Trivial late systolic MR.  LA was moderately to severely dilated.  HLD, HTN, and remains in a fib on apixaban. Pt recently underwent Rt shoulder rotator cuff repair and has developed lower ext edema.  He does admit to eating salt, sitting up with legs down even to sleep and he has chronic a fib.  His wife tells me his legs are down some from a few days ago.  Also from Home his wt is now up 10 lbs from that time.  Today he is much better, no lower ext edema and no SOB.  He feels 95% of his normal.  He feels the Rt shoulder is making the 95% not feel well.  He has stopped salt and was able to sleep on sofa last night.  His shoulder is determining his position.  No chest pain and no complaints.  I had discussed with Dr. Debara [name] who saw him in the hospital if pt could go to [facility] and if he was better he could go.  He is flying down.  Other family and his wife are going.     Past Medical History:  Diagnosis Date  . Angina    none since CABG  .  Arthritis   . Bladder spasm    takes Levsin as needed  . CAD (coronary artery disease)   . Diverticulosis   . Duodenal ulcer    hx of  . Dysrhythmia    atrial fib post Cabg/ takes Eliquis daily as well as Metoprolol   . GERD (gastroesophageal reflux disease)    takes Protonix daily  . History of blood transfusion    not abnormal reaction  . History of colon polyps    benign  . History of kidney stones    hx of  . Hyperlipidemia    takes Atorvastatin daily  . HYPERLIPIDEMIA 05/25/2009  . Joint pain   . Nocturia   . PAF (paroxysmal atrial fibrillation) ([place])     Past Surgical History:  Procedure Laterality Date  . CARDIAC CATHETERIZATION     6/12  . COLONOSCOPY    . CORONARY ARTERY BYPASS GRAFT  03/27/2011   x3, Dr Blake [name]    . ESOPHAGOGASTRODUODENOSCOPY ENDOSCOPY  04/12/11  cauterization of bleeding ulcer  and artery in duodenum and stomach   . INGUINAL HERNIA REPAIR  11/06/2011   Procedure: HERNIA REPAIR INGUINAL ADULT;  Surgeon: Blake Regal, MD;  Location: [facility];  Service: General;  Laterality: N/A;  Repair left inguinal hernia with mesh  . WISDOM TOOTH EXTRACTION       Current Outpatient Prescriptions  Medication Sig Dispense Refill  . alfuzosin (UROXATRAL) 10 MG 24 hr tablet Take 10 mg by mouth daily.     [name] apixaban (ELIQUIS) 5 MG TABS tablet Take 1 tablet (5 mg total) by mouth 2 (two) times daily. 180 tablet 3  . atorvastatin (LIPITOR) 80 MG tablet Take 80 mg by mouth every evening.    . furosemide (LASIX) 40 MG tablet Take 1 tablet (40 mg total) by mouth daily. 30 tablet 1  . metoprolol tartrate 37.5 MG TABS Take 37.5 mg by mouth 2 (two) times daily. 60 tablet 2  . pantoprazole (PROTONIX) 40 MG tablet Take 1 tablet (40 mg total) by mouth daily. 90 tablet 3  . potassium chloride SA (K-DUR,KLOR-CON) 20 MEQ tablet Take 1 tablet (20 mEq total) by mouth daily. 35 tablet 1  . traMADol (ULTRAM) 50 MG tablet Take 1 tablet (50 mg total) by mouth 2 (two) times  daily between meals as needed. 30 tablet 0   No current facility-administered medications for this visit.     Allergies:   Patient has no known allergies.    Social History:  The patient  reports that he quit smoking about 28 years ago. He has a 25.00 pack-year smoking history. He has never used smokeless tobacco. He reports that he drinks about 0.6 oz of alcohol per week . He reports that he does not use drugs.   Family History:  The patient's family history includes Atrial fibrillation in his brother; Cancer in his father; Coronary artery disease in his father; Diabetes in his sister; Heart attack (age of onset: 53) in his sister; Heart attack (age of onset: 52) in his father; Ovarian cancer in his sister.    ROS:  General:no colds or fevers, 12 lb wt loss.  Skin:no rashes or ulcers- some redness of both legs at ankles from recent edema HEENT:no blurred vision, no congestion CV:see HPI PUL:see HPI GI:no diarrhea constipation or melena, no indigestion GU:no hematuria, no dysuria MS:no joint pain, no claudication Neuro:no syncope, no lightheadedness Endo:no diabetes, no thyroid disease  Wt Readings from Last 3 Encounters:  02/15/17 190 lb 12.8 oz (86.5 kg)  02/13/17 191 lb 4.8 oz (86.8 kg)  02/11/17 202 lb 12.8 oz (92 kg)     PHYSICAL EXAM: VS:  BP 108/76   Pulse 92   Ht 5\' 9"  (1.753 m)   Wt 190 lb 12.8 oz (86.5 kg)   SpO2 98%   BMI 28.18 kg/m  , BMI Body mass index is 28.18 kg/m. General:Pleasant affect, NAD Skin:Warm and dry, brisk capillary refill HEENT:normocephalic, sclera clear, mucus membranes moist Neck:supple, no JVD, no bruits  Heart:S1S2 RRR with soft murmur, no gallup, rub or click Lungs:clear without rales, rhonchi, or wheezes JOA:CZYS, non tender, + BS, do not palpate liver spleen or masses Ext:no lower ext edema, 2+ pedal pulses, 2+ radial pulses Neuro:alert and oriented X 3, MAE, follows commands, + facial symmetry    EKG:  EKG is NOT ordered  today.    Recent Labs: 01/23/2017: ALT 19 02/12/2017: B Natriuretic Peptide 109.2; Hemoglobin 12.0; Magnesium 2.1; Platelets 279 02/13/2017: BUN 24; Creatinine,  Ser 0.96; Potassium 3.8; Sodium 139    Lipid Panel    Component Value Date/Time   CHOL 98 11/07/2015 1643   TRIG 58.0 11/07/2015 1643   HDL 33.90 (L) 11/07/2015 1643   CHOLHDL 3 11/07/2015 1643   VLDL 11.6 11/07/2015 1643   LDLCALC 52 11/07/2015 1643       Other studies Reviewed: Additional studies/ records that were reviewed today include: .  ECHO:  02/12/17 Study Conclusions  - Left ventricle: The cavity size was normal. Wall thickness was   increased in a pattern of mild LVH. Systolic function was normal.   The estimated ejection fraction was in the range of 55% to 60%.   Wall motion was normal; there were no regional wall motion   abnormalities. - Aortic valve: There was trivial regurgitation. - Mitral valve: Calcified annulus. There was moderate to severe   regurgitation. - Left atrium: The atrium was moderately dilated.  Impressions:  - Normal LV systolic function; trace AI; moderate to severe MR;   moderate LAE.  ASSESSMENT AND PLAN:  1.  Acute diastolic HF with hospitalization.  Loss of 12 lbs from Monday.  Metoprolol decreased due to hypotension and now at 37.5 BID.  On Lasix 40 mg po daily.  2. Permanent a fib rate controlled on 37.5.  BID  New script sent.  3.  Anticoagulation on Eliquis no bleeding  4. Mod to severe MR on echo this has changed from 2014.  May have been reason for HF post op.  In addition to salt and sitting or standing only, no lying down due to shoulder pain.  5. Rt rotator cuff repair.   6. CAD with hx CABG no chest pain  Ok to resume PT. Follow up with Dr. Stanford [name] in May.    Current medicines are reviewed with the patient today.  The patient Has no concerns regarding medicines.  The following changes have been made:  See above Labs/ tests ordered today  include:see above  Disposition:   FU:  see above  Signed, Cecilie Kicks, NP  02/15/2017 1:45 PM    [facility] [place], [address] [place], [place] Phone: [phone]; Fax: [phone]

## 2017-02-18 ENCOUNTER — Ambulatory Visit (INDEPENDENT_AMBULATORY_CARE_PROVIDER_SITE_OTHER): Payer: Medicare Other | Admitting: Orthopaedic Surgery

## 2017-02-18 ENCOUNTER — Inpatient Hospital Stay: Payer: Medicare Other | Admitting: Internal Medicine

## 2017-02-19 NOTE — Telephone Encounter (Signed)
Blake [name] with pT

## 2017-02-25 ENCOUNTER — Encounter (INDEPENDENT_AMBULATORY_CARE_PROVIDER_SITE_OTHER): Payer: Self-pay | Admitting: Orthopaedic Surgery

## 2017-02-25 ENCOUNTER — Ambulatory Visit (INDEPENDENT_AMBULATORY_CARE_PROVIDER_SITE_OTHER): Payer: Medicare Other | Admitting: Orthopaedic Surgery

## 2017-02-25 VITALS — BP 88/56 | HR 105 | Resp 14 | Ht 69.0 in | Wt 185.0 lb

## 2017-02-25 DIAGNOSIS — M25511 Pain in right shoulder: Secondary | ICD-10-CM

## 2017-02-25 DIAGNOSIS — G8929 Other chronic pain: Secondary | ICD-10-CM

## 2017-02-25 NOTE — Progress Notes (Signed)
Office Visit Note   Patient: Blake [name]           Date of Birth: [birth year]           MRN: [id number] Visit Date: 02/25/2017              Requested by: Janith Lima, MD [address], [place] [address] PCP: Janith Lima, MD   Assessment & Plan: Visit Diagnoses:  4 weeks status post rotator cuff tear repair right shoulder. Has been compromised with physical therapy as he developed congestive heart failure and now has developed an early adhesive capsulitis  Plan: Aggressive physical therapy, sling for 2 more weeks, office 2 weeks, instructed wife in range of motion exercises to supplement physical therapy  Follow-Up Instructions: Return if symptoms worsen or fail to improve.   Orders:  No orders of the defined types were placed in this encounter.  No orders of the defined types were placed in this encounter.     Procedures: No procedures performed   Clinical Data: No additional findings.   Subjective: Chief Complaint  Patient presents with  . Left Shoulder - Routine Post Op     Blake [name] is a 71 y o status post 4 weeks RIGHT SHOULDER ARTHROSCOPY WITH SUBACROMIAL DECOMPRESSION AND DISTAL CLAVICLE EXCISION, MINI OPEN RCR. Denies fever, chills cannot find a comfortable position for sleeping. Pt going to PT, tylenol   Blake [name] developed congestive heart failure 2-3 weeks after his shoulder surgery and now is taking Lasix and feeling much better. Has not had a chance to have appropriate physical therapy and  is not really having any pain with his arm in a sling.[name] He denies any numbness or tingling.  HPI  Review of Systems   Objective: Vital Signs: BP (!) 88/56   Pulse (!) 105   Resp 14   Ht 5\' 9"  (1.753 m)   Wt 185 lb (83.9 kg)   BMI 27.32 kg/m   Physical Exam  Ortho Exam limited range of motion of right shoulder. I could just about get 90 of abduction and maybe 100 of flexion at which point he was uncomfortable. Neurovascular  exam is intact. Incisions of healed nicely.  Specialty Comments:  No specialty comments available.  Imaging: No results found.   PMFS History: Patient Active Problem List   Diagnosis Date Noted  . Mitral valve insufficiency   . Community acquired pneumonia of left lower lobe of lung ([place]) 02/12/2017  . Acute diastolic CHF (congestive heart failure) ([place]) 02/12/2017  . Dyspnea 02/12/2017  . Unspecified rotator cuff tear or rupture of right shoulder, not specified as traumatic 01/29/2017  . AC (acromioclavicular) arthritis 01/29/2017  . Tendonitis of upper biceps tendon of right shoulder 01/29/2017  . Pain in joint of right shoulder 10/09/2016  . Primary osteoarthritis of right shoulder 10/09/2016  . Spinal stenosis of lumbar region at multiple levels 12/01/2015  . Routine general medical examination at a health care facility 11/08/2015  . Obesity (BMI 30-39.9) 10/28/2012  . BPH (benign prostatic hyperplasia) 06/08/2011  . Gastric ulcer 06/08/2011  . Coronary artery disease due to lipid rich plaque 05/03/2011  . Atrial fibrillation, chronic ([place]) 04/05/2011  . Hyperlipidemia 05/25/2009  . BINGE EATING DISORDER 05/25/2009   Past Medical History:  Diagnosis Date  . Angina    none since CABG  . Arthritis   . Bladder spasm    takes Levsin as needed  . CAD (coronary artery disease)   .  Diverticulosis   . Duodenal ulcer    hx of  . Dysrhythmia    atrial fib post Cabg/ takes Eliquis daily as well as Metoprolol   . GERD (gastroesophageal reflux disease)    takes Protonix daily  . History of blood transfusion    not abnormal reaction  . History of colon polyps    benign  . History of kidney stones    hx of  . Hyperlipidemia    takes Atorvastatin daily  . HYPERLIPIDEMIA 05/25/2009  . Joint pain   . Nocturia   . PAF (paroxysmal atrial fibrillation) ([facility])     Family History  Problem Relation Age of Onset  . Coronary artery disease Father   . Heart attack Father 54  .  Cancer Father   . Lymphoma    . Diabetes Sister   . Heart attack Sister 92  . Ovarian cancer Sister   . Atrial fibrillation Brother   . Colon cancer Neg Hx   . Stomach cancer Neg Hx     Past Surgical History:  Procedure Laterality Date  . CARDIAC CATHETERIZATION     6/12  . COLONOSCOPY    . CORONARY ARTERY BYPASS GRAFT  03/27/2011   x3, Dr Tharon Aquas Trigt    . ESOPHAGOGASTRODUODENOSCOPY ENDOSCOPY  04/12/11   cauterization of bleeding ulcer  and artery in duodenum and stomach   . INGUINAL HERNIA REPAIR  11/06/2011   Procedure: HERNIA REPAIR INGUINAL ADULT;  Surgeon: Earnstine Regal, MD;  Location: [facility];  Service: General;  Laterality: N/A;  Repair left inguinal hernia with mesh  . WISDOM TOOTH EXTRACTION     Social History   Occupational History  . retired     Social History Main Topics  . Smoking status: Former Smoker    Packs/day: 1.00    Years: 25.00    Quit date: 03/04/1988  . Smokeless tobacco: Never Used  . Alcohol use 0.6 oz/week    1 Cans of beer per week     Comment: occ  . Drug use: No  . Sexual activity: Not on file     Garald Balding, MD   Note - This record has been created using [employer].  Chart creation errors have been sought, but may not always  have been located. Such creation errors do not reflect on  the standard of medical care.

## 2017-02-27 ENCOUNTER — Other Ambulatory Visit (HOSPITAL_COMMUNITY): Payer: Medicare Other

## 2017-02-27 ENCOUNTER — Other Ambulatory Visit (INDEPENDENT_AMBULATORY_CARE_PROVIDER_SITE_OTHER): Payer: Medicare Other

## 2017-02-27 ENCOUNTER — Ambulatory Visit (INDEPENDENT_AMBULATORY_CARE_PROVIDER_SITE_OTHER): Payer: Medicare Other | Admitting: Internal Medicine

## 2017-02-27 ENCOUNTER — Inpatient Hospital Stay: Payer: Medicare Other | Admitting: Internal Medicine

## 2017-02-27 ENCOUNTER — Encounter: Payer: Self-pay | Admitting: Internal Medicine

## 2017-02-27 VITALS — BP 102/64 | HR 90 | Temp 98.4°F | Resp 16 | Ht 69.0 in | Wt 184.8 lb

## 2017-02-27 DIAGNOSIS — J181 Lobar pneumonia, unspecified organism: Principal | ICD-10-CM

## 2017-02-27 DIAGNOSIS — N400 Enlarged prostate without lower urinary tract symptoms: Secondary | ICD-10-CM

## 2017-02-27 DIAGNOSIS — I2583 Coronary atherosclerosis due to lipid rich plaque: Secondary | ICD-10-CM

## 2017-02-27 DIAGNOSIS — I2510 Atherosclerotic heart disease of native coronary artery without angina pectoris: Secondary | ICD-10-CM

## 2017-02-27 DIAGNOSIS — E785 Hyperlipidemia, unspecified: Secondary | ICD-10-CM

## 2017-02-27 DIAGNOSIS — Z23 Encounter for immunization: Secondary | ICD-10-CM

## 2017-02-27 DIAGNOSIS — Z0000 Encounter for general adult medical examination without abnormal findings: Secondary | ICD-10-CM | POA: Diagnosis not present

## 2017-02-27 DIAGNOSIS — I5032 Chronic diastolic (congestive) heart failure: Secondary | ICD-10-CM

## 2017-02-27 DIAGNOSIS — J189 Pneumonia, unspecified organism: Secondary | ICD-10-CM

## 2017-02-27 LAB — CBC WITH DIFFERENTIAL/PLATELET
BASOS ABS: 0.1 10*3/uL (ref 0.0–0.1)
Basophils Relative: 1.1 % (ref 0.0–3.0)
EOS ABS: 0.1 10*3/uL (ref 0.0–0.7)
Eosinophils Relative: 1.7 % (ref 0.0–5.0)
HCT: 41.5 % (ref 39.0–52.0)
Hemoglobin: 13.9 g/dL (ref 13.0–17.0)
LYMPHS PCT: 24.3 % (ref 12.0–46.0)
Lymphs Abs: 2.1 10*3/uL (ref 0.7–4.0)
MCHC: 33.5 g/dL (ref 30.0–36.0)
MCV: 95.6 fl (ref 78.0–100.0)
Monocytes Absolute: 0.9 10*3/uL (ref 0.1–1.0)
Monocytes Relative: 10.4 % (ref 3.0–12.0)
NEUTROS PCT: 62.5 % (ref 43.0–77.0)
Neutro Abs: 5.5 10*3/uL (ref 1.4–7.7)
Platelets: 261 10*3/uL (ref 150.0–400.0)
RBC: 4.34 Mil/uL (ref 4.22–5.81)
RDW: 14.1 % (ref 11.5–15.5)
WBC: 8.8 10*3/uL (ref 4.0–10.5)

## 2017-02-27 LAB — LIPID PANEL
Cholesterol: 87 mg/dL (ref 0–200)
HDL: 37.9 mg/dL — AB (ref >39.00)
LDL Cholesterol: 35 mg/dL (ref 0–99)
NONHDL: 48.87
Total CHOL/HDL Ratio: 2
Triglycerides: 69 mg/dL (ref 0.0–149.0)
VLDL: 13.8 mg/dL (ref 0.0–40.0)

## 2017-02-27 LAB — COMPREHENSIVE METABOLIC PANEL
ALK PHOS: 79 U/L (ref 39–117)
ALT: 13 U/L (ref 0–53)
AST: 15 U/L (ref 0–37)
Albumin: 4.5 g/dL (ref 3.5–5.2)
BILIRUBIN TOTAL: 1.2 mg/dL (ref 0.2–1.2)
BUN: 14 mg/dL (ref 6–23)
CO2: 28 mEq/L (ref 19–32)
Calcium: 9.8 mg/dL (ref 8.4–10.5)
Chloride: 104 mEq/L (ref 96–112)
Creatinine, Ser: 0.89 mg/dL (ref 0.40–1.50)
GFR: 89.1 mL/min (ref >60.00)
GLUCOSE: 93 mg/dL (ref 70–99)
Potassium: 4.2 mEq/L (ref 3.5–5.1)
SODIUM: 140 meq/L (ref 135–145)
TOTAL PROTEIN: 7.1 g/dL (ref 6.0–8.3)

## 2017-02-27 LAB — PSA: PSA: 6.58 ng/mL — ABNORMAL HIGH (ref 0.10–4.00)

## 2017-02-27 LAB — TSH: TSH: 0.53 u[IU]/mL (ref 0.35–4.50)

## 2017-02-27 MED ORDER — ZOSTER VAC RECOMB ADJUVANTED 50 MCG/0.5ML IM SUSR: 0.5000 mL | Freq: Once | INTRAMUSCULAR | 1 refills | Status: AC

## 2017-02-27 NOTE — Assessment & Plan Note (Signed)

## 2017-02-27 NOTE — Patient Instructions (Signed)
 Health Maintenance, Male A healthy lifestyle and preventive care is important for your health and wellness. Ask your health care provider about what schedule of regular examinations is right for you. What should I know about weight and diet?  Eat a Healthy Diet  Eat plenty of vegetables, fruits, whole grains, low-fat dairy products, and lean protein.  Do not eat a lot of foods high in solid fats, added sugars, or salt. Maintain a Healthy Weight  Regular exercise can help you achieve or maintain a healthy weight. You should:  Do at least 150 minutes of exercise each week. The exercise should increase your heart rate and make you sweat (moderate-intensity exercise).  Do strength-training exercises at least twice a week. Watch Your Levels of Cholesterol and Blood Lipids  Have your blood tested for lipids and cholesterol every 5 years starting at 73 years of age. If you are at high risk for heart disease, you should start having your blood tested when you are 73 years old. You may need to have your cholesterol levels checked more often if:  Your lipid or cholesterol levels are high.  You are older than 73 years of age.  You are at high risk for heart disease. What should I know about cancer screening? Many types of cancers can be detected early and may often be prevented. Lung Cancer  You should be screened every year for lung cancer if:  You are a current smoker who has smoked for at least 30 years.  You are a former smoker who has quit within the past 15 years.  Talk to your health care provider about your screening options, when you should start screening, and how often you should be screened. Colorectal Cancer  Routine colorectal cancer screening usually begins at 73 years of age and should be repeated every 5-10 years until you are 73 years old. You may need to be screened more often if early forms of precancerous polyps or small growths are found. Your health care provider  may recommend screening at an earlier age if you have risk factors for colon cancer.  Your health care provider may recommend using home test kits to check for hidden blood in the stool.  A small camera at the end of a tube can be used to examine your colon (sigmoidoscopy or colonoscopy). This checks for the earliest forms of colorectal cancer. Prostate and Testicular Cancer  Depending on your age and overall health, your health care provider may do certain tests to screen for prostate and testicular cancer.  Talk to your health care provider about any symptoms or concerns you have about testicular or prostate cancer. Skin Cancer  Check your skin from head to toe regularly.  Tell your health care provider about any new moles or changes in moles, especially if:  There is a change in a mole's size, shape, or color.  You have a mole that is larger than a pencil eraser.  Always use sunscreen. Apply sunscreen liberally and repeat throughout the day.  Protect yourself by wearing long sleeves, pants, a wide-brimmed hat, and sunglasses when outside. What should I know about heart disease, diabetes, and high blood pressure?  If you are 18-39 years of age, have your blood pressure checked every 3-5 years. If you are 40 years of age or older, have your blood pressure checked every year. You should have your blood pressure measured twice-once when you are at a hospital or clinic, and once when you are not at   a hospital or clinic. Record the average of the two measurements. To check your blood pressure when you are not at a hospital or clinic, you can use:  An automated blood pressure machine at a pharmacy.  A home blood pressure monitor.  Talk to your health care provider about your target blood pressure.  If you are between 45-79 years old, ask your health care provider if you should take aspirin to prevent heart disease.  Have regular diabetes screenings by checking your fasting blood sugar  level.  If you are at a normal weight and have a low risk for diabetes, have this test once every three years after the age of 45.  If you are overweight and have a high risk for diabetes, consider being tested at a younger age or more often.  A one-time screening for abdominal aortic aneurysm (AAA) by ultrasound is recommended for men aged 65-75 years who are current or former smokers. What should I know about preventing infection? Hepatitis B  If you have a higher risk for hepatitis B, you should be screened for this virus. Talk with your health care provider to find out if you are at risk for hepatitis B infection. Hepatitis C  Blood testing is recommended for:  Everyone born from 1945 through 1965.  Anyone with known risk factors for hepatitis C. Sexually Transmitted Diseases (STDs)  You should be screened each year for STDs including gonorrhea and chlamydia if:  You are sexually active and are younger than 73 years of age.  You are older than 73 years of age and your health care provider tells you that you are at risk for this type of infection.  Your sexual activity has changed since you were last screened and you are at an increased risk for chlamydia or gonorrhea. Ask your health care provider if you are at risk.  Talk with your health care provider about whether you are at high risk of being infected with HIV. Your health care provider may recommend a prescription medicine to help prevent HIV infection. What else can I do?  Schedule regular health, dental, and eye exams.  Stay current with your vaccines (immunizations).  Do not use any tobacco products, such as cigarettes, chewing tobacco, and e-cigarettes. If you need help quitting, ask your health care provider.  Limit alcohol intake to no more than 2 drinks per day. One drink equals 12 ounces of beer, 5 ounces of wine, or 1 ounces of hard liquor.  Do not use street drugs.  Do not share needles.  Ask your health  care provider for help if you need support or information about quitting drugs.  Tell your health care provider if you often feel depressed.  Tell your health care provider if you have ever been abused or do not feel safe at home. This information is not intended to replace advice given to you by your health care provider. Make sure you discuss any questions you have with your health care provider. Document Released: 04/05/2008 Document Revised: 06/06/2016 Document Reviewed: 07/12/2015 Elsevier Interactive Patient Education  2017 [employer]  

## 2017-02-27 NOTE — Progress Notes (Signed)
Subjective:  Patient ID: Blake [name], male    DOB: 10/02/1946  Age: 71 y.o. MRN: [id number]  CC: Annual Exam; Hyperlipidemia; and Hypertension   HPI Blake [name] presents for a CPX.  He returns after recent admission for diastolic heart failure. He is doing well on furosemide and metoprolol. He's had no recent episodes of chest pain, shortness of breath, edema, palpitations, fatigue.  Past Medical History:  Diagnosis Date  . Angina    none since CABG  . Arthritis   . Bladder spasm    takes Levsin as needed  . CAD (coronary artery disease)   . Diverticulosis   . Duodenal ulcer    hx of  . Dysrhythmia    atrial fib post Cabg/ takes Eliquis daily as well as Metoprolol   . GERD (gastroesophageal reflux disease)    takes Protonix daily  . History of blood transfusion    not abnormal reaction  . History of colon polyps    benign  . History of kidney stones    hx of  . Hyperlipidemia    takes Atorvastatin daily  . HYPERLIPIDEMIA 05/25/2009  . Joint pain   . Nocturia   . PAF (paroxysmal atrial fibrillation) ([place])    Past Surgical History:  Procedure Laterality Date  . CARDIAC CATHETERIZATION     6/12  . COLONOSCOPY    . CORONARY ARTERY BYPASS GRAFT  03/27/2011   x3, Dr Tharon Aquas Trigt    . ESOPHAGOGASTRODUODENOSCOPY ENDOSCOPY  04/12/11   cauterization of bleeding ulcer  and artery in duodenum and stomach   . INGUINAL HERNIA REPAIR  11/06/2011   Procedure: HERNIA REPAIR INGUINAL ADULT;  Surgeon: Earnstine Regal, MD;  Location: [facility];  Service: General;  Laterality: N/A;  Repair left inguinal hernia with mesh  . WISDOM TOOTH EXTRACTION      reports that he quit smoking about 29 years ago. He has a 25.00 pack-year smoking history. He has never used smokeless tobacco. He reports that he drinks about 0.6 oz of alcohol per week . He reports that he does not use drugs. family history includes Atrial fibrillation in his brother; Cancer in his father; Coronary artery disease  in his father; Diabetes in his sister; Heart attack (age of onset: 42) in his sister; Heart attack (age of onset: 69) in his father; Ovarian cancer in his sister. No Known Allergies  Outpatient Medications Prior to Visit  Medication Sig Dispense Refill  . alfuzosin (UROXATRAL) 10 MG 24 hr tablet Take 10 mg by mouth daily.     [name] apixaban (ELIQUIS) 5 MG TABS tablet Take 1 tablet (5 mg total) by mouth 2 (two) times daily. 180 tablet 3  . atorvastatin (LIPITOR) 80 MG tablet Take 80 mg by mouth every evening.    . furosemide (LASIX) 40 MG tablet Take 1 tablet (40 mg total) by mouth daily. 30 tablet 1  . metoprolol tartrate (LOPRESSOR) 25 MG tablet Take 1.5 tablets (37.5 mg total) by mouth 2 (two) times daily. 180 tablet 3  . pantoprazole (PROTONIX) 40 MG tablet Take 1 tablet (40 mg total) by mouth daily. 90 tablet 3  . potassium chloride SA (K-DUR,KLOR-CON) 20 MEQ tablet Take 1 tablet (20 mEq total) by mouth daily. 35 tablet 1  . traMADol (ULTRAM) 50 MG tablet Take 1 tablet (50 mg total) by mouth 2 (two) times daily between meals as needed. 30 tablet 0   No facility-administered medications prior to visit.  ROS Review of Systems  Constitutional: Negative for diaphoresis, fatigue and unexpected weight change.  HENT: Negative.   Eyes: Negative for visual disturbance.  Respiratory: Negative for cough, chest tightness, shortness of breath and wheezing.   Cardiovascular: Negative for chest pain, palpitations and leg swelling.  Gastrointestinal: Negative for abdominal pain, constipation, diarrhea, nausea and vomiting.  Endocrine: Negative.   Genitourinary: Negative.  Negative for difficulty urinating.  Musculoskeletal: Negative.  Negative for back pain and myalgias.  Skin: Negative.  Negative for color change and rash.  Allergic/Immunologic: Negative.   Neurological: Negative.  Negative for dizziness, weakness, light-headedness and headaches.  Hematological: Negative for adenopathy. Does not  bruise/bleed easily.  Psychiatric/Behavioral: Negative.     Objective:  BP 102/64 (BP Location: Left Arm, Patient Position: Sitting, Cuff Size: Normal)   Pulse 90   Temp 98.4 F (36.9 C) (Oral)   Resp 16   Ht 5\' 9"  (1.753 m)   Wt 184 lb 12 oz (83.8 kg)   SpO2 98%   BMI 27.28 kg/m   BP Readings from Last 3 Encounters:  02/27/17 102/64  02/25/17 (!) 88/56  02/15/17 108/76    Wt Readings from Last 3 Encounters:  02/27/17 184 lb 12 oz (83.8 kg)  02/25/17 185 lb (83.9 kg)  02/15/17 190 lb 12.8 oz (86.5 kg)    Physical Exam  Constitutional: He is oriented to person, place, and time. No distress.  HENT:  Mouth/Throat: Oropharynx is clear and moist. No oropharyngeal exudate.  Eyes: Conjunctivae are normal. Right eye exhibits no discharge. Left eye exhibits no discharge. No scleral icterus.  Neck: Normal range of motion. Neck supple. No JVD present. No tracheal deviation present. No thyromegaly present.  Cardiovascular: Normal rate, normal heart sounds and intact distal pulses.  An irregularly irregular rhythm present. Exam reveals no gallop and no friction rub.   No murmur heard. Pulmonary/Chest: Effort normal and breath sounds normal. No stridor. No respiratory distress. He has no wheezes. He has no rales. He exhibits no tenderness.  Abdominal: Soft. Bowel sounds are normal. He exhibits no distension and no mass. There is no tenderness. There is no rebound and no guarding.  Genitourinary:  Genitourinary Comments: GU and rectal exams were deferred at his request since he tells me that his urologist does this every 6 months.  Musculoskeletal: Normal range of motion. He exhibits no edema or tenderness.  Lymphadenopathy:    He has no cervical adenopathy.  Neurological: He is oriented to person, place, and time.  Skin: Skin is warm and dry. No rash noted. He is not diaphoretic. No erythema. No pallor.  Psychiatric: He has a normal mood and affect. His behavior is normal. Judgment and  thought content normal.  Vitals reviewed.   Lab Results  Component Value Date   WBC 8.8 02/27/2017   HGB 13.9 02/27/2017   HCT 41.5 02/27/2017   PLT 261.0 02/27/2017   GLUCOSE 93 02/27/2017   CHOL 87 02/27/2017   TRIG 69.0 02/27/2017   HDL 37.90 (L) 02/27/2017   LDLCALC 35 02/27/2017   ALT 13 02/27/2017   AST 15 02/27/2017   NA 140 02/27/2017   K 4.2 02/27/2017   CL 104 02/27/2017   CREATININE 0.89 02/27/2017   BUN 14 02/27/2017   CO2 28 02/27/2017   TSH 0.53 02/27/2017   PSA 6.58 (H) 02/27/2017   INR 1.05 01/29/2017   HGBA1C 5.8 11/07/2015    Dg Chest Portable 1 View  Result Date: 02/12/2017 CLINICAL DATA:  Acute onset  of worsening shortness of breath. Initial encounter. EXAM: PORTABLE CHEST 1 VIEW COMPARISON:  Chest radiograph performed 06/28/2016 FINDINGS: The lungs are well-aerated. Mild left basilar opacity could reflect mild infection, depending on the patient's symptoms. No pleural effusion or pneumothorax is seen. The cardiomediastinal silhouette is normal in size. The patient is status post median sternotomy, with evidence of prior CABG. No acute osseous abnormalities are seen. The patient is status post right-sided rotator cuff repair. IMPRESSION: Mild left basilar airspace opacity may reflect mild infection, depending on the patient's symptoms. Electronically Signed   By: Garald Balding M.D.   On: 02/12/2017 01:19    Assessment & Plan:   Channing was seen today for annual exam, hyperlipidemia and hypertension.  Diagnoses and all orders for this visit:  Community acquired pneumonia of left lower lobe of lung ([place])- his diagnosis is an error, he actually had heart failure, repeat chest x-ray was canceled -     Cancel: DG Chest 2 View; Future  Coronary artery disease due to lipid rich plaque- he's had no recent episodes of angina, will continue risk factor modification. -     Lipid panel; Future  Dyslipidemia, goal LDL below 70- He is achieved his LDL goal is  doing well on the statin. -     Lipid panel; Future -     TSH; Future -     CBC with Differential/Platelet; Future -     Comprehensive metabolic panel; Future  Benign prostatic hyperplasia, unspecified whether lower urinary tract symptoms present- his PSA is elevated but it stable over the last year some not concerned about prostate cancer, he will continue to see his urologist for symptom relief and monitoring -     PSA; Future  Diastolic dysfunction with chronic heart failure ([place])- he has a normal volume status today, will continue the current combination of a beta blocker and diuretic  Routine general medical examination at a health care facility  Other orders -     Zoster Vac Recomb Adjuvanted [facility]) injection; Inject 0.5 mLs into the muscle once.   I have discontinued Mr. Odem's traMADol. I am also having him start on Zoster Vac Recomb Adjuvanted. Additionally, I am having him maintain his pantoprazole, apixaban, alfuzosin, atorvastatin, potassium chloride SA, furosemide, and metoprolol tartrate.  Meds ordered this encounter  Medications  . Zoster Vac Recomb Adjuvanted [facility]) injection    Sig: Inject 0.5 mLs into the muscle once.    Dispense:  1 each    Refill:  1   See AVS for instructions about healthy living and anticipatory guidance.  Follow-up: Return in about 6 months (around 08/30/2017).  Scarlette Calico, MD

## 2017-03-04 ENCOUNTER — Encounter: Payer: Self-pay | Admitting: Physical Therapy

## 2017-03-04 ENCOUNTER — Other Ambulatory Visit: Payer: Medicare Other | Admitting: *Deleted

## 2017-03-04 ENCOUNTER — Ambulatory Visit: Payer: Medicare Other | Attending: Orthopaedic Surgery | Admitting: Physical Therapy

## 2017-03-04 DIAGNOSIS — M25511 Pain in right shoulder: Secondary | ICD-10-CM | POA: Diagnosis not present

## 2017-03-04 DIAGNOSIS — M25611 Stiffness of right shoulder, not elsewhere classified: Secondary | ICD-10-CM | POA: Diagnosis not present

## 2017-03-04 DIAGNOSIS — Z79899 Other long term (current) drug therapy: Secondary | ICD-10-CM | POA: Diagnosis not present

## 2017-03-04 DIAGNOSIS — M6281 Muscle weakness (generalized): Secondary | ICD-10-CM | POA: Diagnosis not present

## 2017-03-04 NOTE — Therapy (Signed)
[place], [place], [address] Phone: [phone]   Fax:  [phone]  Physical Therapy Treatment  Patient Details  Name: Blake [name] MRN: [id number] Date of Birth: 08/16/1946 Referring Provider: Joni Fears  Encounter Date: 03/04/2017      PT End of Session - 03/04/17 1421    Visit Number 2   Number of Visits 25   Date for PT Re-Evaluation 05/03/17   PT Start Time 1287   PT Stop Time 1459   PT Time Calculation (min) 44 min   Activity Tolerance Patient tolerated treatment well   Behavior During Therapy [facility] for tasks assessed/performed      Past Medical History:  Diagnosis Date  . Angina    none since CABG  . Arthritis   . Bladder spasm    takes Levsin as needed  . CAD (coronary artery disease)   . Diverticulosis   . Duodenal ulcer    hx of  . Dysrhythmia    atrial fib post Cabg/ takes Eliquis daily as well as Metoprolol   . GERD (gastroesophageal reflux disease)    takes Protonix daily  . History of blood transfusion    not abnormal reaction  . History of colon polyps    benign  . History of kidney stones    hx of  . Hyperlipidemia    takes Atorvastatin daily  . HYPERLIPIDEMIA 05/25/2009  . Joint pain   . Nocturia   . PAF (paroxysmal atrial fibrillation) ([place])     Past Surgical History:  Procedure Laterality Date  . CARDIAC CATHETERIZATION     6/12  . COLONOSCOPY    . CORONARY ARTERY BYPASS GRAFT  03/27/2011   x3, Dr Tharon Aquas Trigt    . ESOPHAGOGASTRODUODENOSCOPY ENDOSCOPY  04/12/11   cauterization of bleeding ulcer  and artery in duodenum and stomach   . INGUINAL HERNIA REPAIR  11/06/2011   Procedure: HERNIA REPAIR INGUINAL ADULT;  Surgeon: Earnstine Regal, MD;  Location: [facility];  Service: General;  Laterality: N/A;  Repair left inguinal hernia with mesh  . WISDOM TOOTH EXTRACTION      There were no vitals filed for this visit.      Subjective Assessment - 03/04/17  1418    Subjective Shoulder rarely bothers him, occasional dull pain. Difficulty getting comfortable to sleep. Denies N/t in hand.    Patient Stated Goals do everything I could before the shoulder pain, woodworking   Currently in Pain? No/denies   Pain Score --  2/10 in last 48 hr            [facility] PT Assessment - 03/04/17 0001      Assessment   Medical Diagnosis R RCR   Referring Provider Joni Fears   Onset Date/Surgical Date 01/29/17   Hand Dominance Left   Prior Therapy no     Precautions   Precautions Shoulder   Type of Shoulder Precautions mini open RCR     Observation/Other Assessments   Focus on Therapeutic Outcomes (FOTO)  29% ability (goal 68%)     Posture/Postural Control   Posture Comments tends to sit in a slouched posture and resting arm on leg     ROM / Strength   AROM / PROM / Strength PROM     PROM   PROM Assessment Site Shoulder   Right/Left Shoulder Right   Right Shoulder Flexion 110 Degrees   Right Shoulder ABduction 90 Degrees   Right Shoulder External  Rotation 10 Degrees     Palpation   Palpation comment denies TTP                     [facility] Adult PT Treatment/Exercise - 03/04/17 0001      Shoulder Exercises: Supine   Other Supine Exercises passive flexion & ER     Shoulder Exercises: Isometric Strengthening   Extension Other (comment)  10*5s   Internal Rotation Other (comment)  10*5s     Manual Therapy   Manual Therapy Passive ROM   Passive ROM flexion, abduction, ER                PT Education - 03/04/17 1728    Education provided Yes   Education Details progression depending on healing and protocol, HEP, POC, posture    Person(s) Educated Patient;Spouse   Methods Explanation;Demonstration;Tactile cues;Verbal cues;Handout   Comprehension Verbalized understanding;Returned demonstration;Verbal cues required;Tactile cues required;Need further instruction          PT Short Term Goals - 02/06/17 1605       PT SHORT TERM GOAL #1   Title PROM within 10 deg of L upper extremity by 5/18   Baseline see flowsheet   Time 4   Period Weeks   Status New     PT SHORT TERM GOAL #2   Title Pt will be independent with HEP as it has progressed on 5/18   Baseline will continue to educate and progress as appropraite   Time 4   Period Weeks   Status New           PT Long Term Goals - 02/06/17 1606      PT LONG TERM GOAL #1   Title Pt will be able to use L UE to reach overhead and grab dishes from overhead cabinets without increase in pain by 7/13   Baseline unable at eval   Time 12   Period Weeks   Status New     PT LONG TERM GOAL #2   Title Pt will be able to return to light activities of wood working with minimal limitation by shoulder to return to PLOF.    Baseline unable at eval   Time 12   Period Weeks   Status New     PT LONG TERM GOAL #3   Title AROM within 15 deg of R upper extremity to improve functional use of arm   Baseline unable to perform AROM at eval   Time 12   Period Weeks   Status New     PT LONG TERM GOAL #4   Title GHJ MMT grossly 4+/5 to indicate proper support to GHJ by surrounding soft tissue.    Baseline unable to MMT at eval   Time 12   Period Weeks   Status New     PT LONG TERM GOAL #5   Title FOTO to 68% ability to indicate significant improvement in functional ability   Baseline 29% ability at eval   Time 12   Period Weeks   Status New               Plan - 03/04/17 1729    Clinical Impression Statement Pt returns to PT today after admittance to ED for CHF and has been medically cleared to resume PT. Pt has minimal pain and has progressed ROM well since last seeing him. Pt has weened himself from the sling and is only using it PRN. We discussed proper positioning of  shoulder and posture to decrease pain and to use sling PRN. Will continue to progress as appropriate to meet long term functional goals.    PT Treatment/Interventions  ADLs/Self Care Home Management;Cryotherapy;Electrical Stimulation;Functional mobility training;Traction;Moist Heat;Therapeutic activities;Therapeutic exercise;Neuromuscular re-education;Patient/family education;Passive range of motion;Scar mobilization;Manual techniques;Taping;Dry needling   PT Next Visit Plan PROM through 5/22 per MD, pendulums, hand/forearm work   PT Home Exercise Plan scapular retraction, putty work, ice, resting on pillow, isometric IR & Ext, PROM flexion & ER   Consulted and Agree with Plan of Care Patient;Family member/caregiver   Family Member Consulted Wife      Patient will benefit from skilled therapeutic intervention in order to improve the following deficits and impairments:  Decreased range of motion, Impaired UE functional use, Increased muscle spasms, Decreased activity tolerance, Pain, Improper body mechanics, Impaired flexibility, Decreased scar mobility, Decreased mobility, Decreased strength, Postural dysfunction  Visit Diagnosis: Acute pain of right shoulder - Plan: PT plan of care cert/re-cert  Muscle weakness (generalized) - Plan: PT plan of care cert/re-cert  Stiffness of right shoulder, not elsewhere classified - Plan: PT plan of care cert/re-cert       G-Codes - 01-Apr-2017 1731    Functional Assessment Tool Used (Outpatient Only) FOTO 29% ability (goal 68%), clinical judgement   Functional Limitation Carrying, moving and handling objects   Carrying, Moving and Handling Objects Current Status (T3646) At least 60 percent but less than 80 percent impaired, limited or restricted   Carrying, Moving and Handling Objects Goal Status (O0321) At least 20 percent but less than 40 percent impaired, limited or restricted      Problem List Patient Active Problem List   Diagnosis Date Noted  . Mitral valve insufficiency   . Diastolic dysfunction with chronic heart failure ([place]) 02/12/2017  . AC (acromioclavicular) arthritis 01/29/2017  . Spinal stenosis of  lumbar region at multiple levels 12/01/2015  . Routine general medical examination at a health care facility 11/08/2015  . Obesity (BMI 30-39.9) 10/28/2012  . BPH (benign prostatic hyperplasia) 06/08/2011  . Gastric ulcer 06/08/2011  . Coronary artery disease due to lipid rich plaque 05/03/2011  . Atrial fibrillation, chronic ([place]) 04/05/2011  . Dyslipidemia, goal LDL below 70 05/25/2009  . BINGE EATING DISORDER 05/25/2009    [place] Pam C. Manolito Jurewicz PT, DPT 04-01-17 5:34 PM   [facility] [address] [place], [place], [address] Phone: [phone]   Fax:  [phone]  Name: MYRAN ARCIA MRN: [id number] Date of Birth: [birth year]

## 2017-03-06 LAB — BASIC METABOLIC PANEL
BUN / CREAT RATIO: 20 (ref 10–24)
BUN: 19 mg/dL (ref 8–27)
CO2: 25 mmol/L (ref 18–29)
CREATININE: 0.93 mg/dL (ref 0.76–1.27)
Calcium: 9.5 mg/dL (ref 8.6–10.2)
Chloride: 100 mmol/L (ref 96–106)
GFR calc non Af Amer: 82 mL/min/{1.73_m2} (ref >59)
GFR, EST AFRICAN AMERICAN: 94 mL/min/{1.73_m2} (ref >59)
GLUCOSE: 140 mg/dL — AB (ref 65–99)
Potassium: 3.8 mmol/L (ref 3.5–5.2)
SODIUM: 142 mmol/L (ref 134–144)

## 2017-03-08 ENCOUNTER — Ambulatory Visit: Payer: Medicare Other | Admitting: Physical Therapy

## 2017-03-08 ENCOUNTER — Encounter: Payer: Self-pay | Admitting: Physical Therapy

## 2017-03-08 DIAGNOSIS — M25511 Pain in right shoulder: Secondary | ICD-10-CM | POA: Diagnosis not present

## 2017-03-08 DIAGNOSIS — M25611 Stiffness of right shoulder, not elsewhere classified: Secondary | ICD-10-CM | POA: Diagnosis not present

## 2017-03-08 DIAGNOSIS — M6281 Muscle weakness (generalized): Secondary | ICD-10-CM

## 2017-03-08 NOTE — Therapy (Signed)
[place], [place], [address] Phone: [phone]   Fax:  [phone]  Physical Therapy Treatment  Patient Details  Name: Blake [name] MRN: [id number] Date of Birth: [birth year] Referring Provider: Joni Fears  Encounter Date: 03/08/2017      PT End of Session - 03/08/17 1018    Visit Number 3   Number of Visits 25   Date for PT Re-Evaluation 05/03/17   Authorization Type [employer] MCR, certified through 3/38,    PT 6 week re eval by 5/30   PT Start Time 1018   PT Stop Time 1100   PT Time Calculation (min) 42 min   Activity Tolerance Patient tolerated treatment well   Behavior During Therapy [facility] for tasks assessed/performed      Past Medical History:  Diagnosis Date  . Angina    none since CABG  . Arthritis   . Bladder spasm    takes Levsin as needed  . CAD (coronary artery disease)   . Diverticulosis   . Duodenal ulcer    hx of  . Dysrhythmia    atrial fib post Cabg/ takes Eliquis daily as well as Metoprolol   . GERD (gastroesophageal reflux disease)    takes Protonix daily  . History of blood transfusion    not abnormal reaction  . History of colon polyps    benign  . History of kidney stones    hx of  . Hyperlipidemia    takes Atorvastatin daily  . HYPERLIPIDEMIA 05/25/2009  . Joint pain   . Nocturia   . PAF (paroxysmal atrial fibrillation) ([place])     Past Surgical History:  Procedure Laterality Date  . CARDIAC CATHETERIZATION     6/12  . COLONOSCOPY    . CORONARY ARTERY BYPASS GRAFT  03/27/2011   x3, Dr Tharon Aquas Trigt    . ESOPHAGOGASTRODUODENOSCOPY ENDOSCOPY  04/12/11   cauterization of bleeding ulcer  and artery in duodenum and stomach   . INGUINAL HERNIA REPAIR  11/06/2011   Procedure: HERNIA REPAIR INGUINAL ADULT;  Surgeon: Earnstine Regal, MD;  Location: [facility];  Service: General;  Laterality: N/A;  Repair left inguinal hernia with mesh  . WISDOM TOOTH EXTRACTION       There were no vitals filed for this visit.      Subjective Assessment - 03/08/17 1018    Subjective Reports sorness in shoulder, has been moving around a lot. Biggest problem is still getting comfortable to sleep   Currently in Pain? Yes   Pain Score 1    Pain Location Shoulder   Pain Orientation Right   Pain Descriptors / Indicators Sore                         [facility] Adult PT Treatment/Exercise - 03/08/17 0001      Shoulder Exercises: Seated   Other Seated Exercises wrist flexion & extension red tband     Shoulder Exercises: Pulleys   Flexion 3 minutes     Cryotherapy   Number Minutes Cryotherapy 10 Minutes   Cryotherapy Location Shoulder   Type of Cryotherapy Ice pack     Manual Therapy   Manual Therapy Soft tissue mobilization   Soft tissue mobilization R upper trap, levator   Passive ROM flexion, abduction, ER                PT Education - 03/08/17 1058    Education provided Yes  Education Details manual form/rationale, HEP, different sensations with stretching   Person(s) Educated Patient   Methods Explanation;Demonstration;Tactile cues;Verbal cues;Handout   Comprehension Verbalized understanding;Returned demonstration;Tactile cues required;Verbal cues required;Need further instruction          PT Short Term Goals - 03/08/17 1056      PT SHORT TERM GOAL #1   Title PROM within 10 deg of L upper extremity by 5/18   Baseline see flowsheet   Status On-going     PT SHORT TERM GOAL #2   Title Pt will be independent with HEP as it has progressed on 5/18   Baseline verbalizes independence and compliance as it has been established at this point, will continue to progress as appropriate   Status Achieved           PT Long Term Goals - 02/06/17 1606      PT LONG TERM GOAL #1   Title Pt will be able to use L UE to reach overhead and grab dishes from overhead cabinets without increase in pain by 7/13   Baseline unable at eval    Time 12   Period Weeks   Status New     PT LONG TERM GOAL #2   Title Pt will be able to return to light activities of wood working with minimal limitation by shoulder to return to PLOF.    Baseline unable at eval   Time 12   Period Weeks   Status New     PT LONG TERM GOAL #3   Title AROM within 15 deg of R upper extremity to improve functional use of arm   Baseline unable to perform AROM at eval   Time 12   Period Weeks   Status New     PT LONG TERM GOAL #4   Title GHJ MMT grossly 4+/5 to indicate proper support to GHJ by surrounding soft tissue.    Baseline unable to MMT at eval   Time 12   Period Weeks   Status New     PT LONG TERM GOAL #5   Title FOTO to 68% ability to indicate significant improvement in functional ability   Baseline 29% ability at eval   Time 12   Period Weeks   Status New               Plan - 03/08/17 1055    Clinical Impression Statement Pt tolerates PROM well and was educated on use of pulleys for long stretches when he is not in PT at his [facility]. Educated on keeping pull sensation to a minimum and avoiding pinching.    PT Next Visit Plan PROM through 5/22 per MD, pendulums, hand/forearm work   PT Home Exercise Plan scapular retraction, putty work, ice, resting on pillow, isometric IR & Ext, PROM flexion & ER   Consulted and Agree with Plan of Care Patient      Patient will benefit from skilled therapeutic intervention in order to improve the following deficits and impairments:     Visit Diagnosis: Acute pain of right shoulder  Muscle weakness (generalized)  Stiffness of right shoulder, not elsewhere classified     Problem List Patient Active Problem List   Diagnosis Date Noted  . Mitral valve insufficiency   . Diastolic dysfunction with chronic heart failure ([place]) 02/12/2017  . AC (acromioclavicular) arthritis 01/29/2017  . Spinal stenosis of lumbar region at multiple levels 12/01/2015  . Routine general medical  examination at a health care facility 11/08/2015  .  Obesity (BMI 30-39.9) 10/28/2012  . BPH (benign prostatic hyperplasia) 06/08/2011  . Gastric ulcer 06/08/2011  . Coronary artery disease due to lipid rich plaque 05/03/2011  . Atrial fibrillation, chronic ([place]) 04/05/2011  . Dyslipidemia, goal LDL below 70 05/25/2009  . BINGE EATING DISORDER 05/25/2009    Michelle Wnek C. Chesney Suares PT, DPT 03/08/17 10:59 AM   [facility] [address] [place], [place], [address] Phone: [phone]   Fax:  [phone]  Name: MAURION WALKOWIAK MRN: [id number] Date of Birth: September 11, 1946

## 2017-03-11 NOTE — Progress Notes (Signed)
HPI: Followup coronary artery disease and atrial fibrillation. Abdominal CT in 2012 showed no aneurysm. Patient had coronary artery bypassing graft in 2012 with a LIMA to the LAD, saphenous vein graft to the first diagonal and saphenous vein graft to the acute marginal. Patient did have postoperative atrial fibrillation which resolved. Monitor in October of 2014 showed paroxysmal atrial fibrillation. Nuclear study in October 2014 showed an ejection fraction of 63% and normal perfusion. Last echocardiogram April 2018 showed normal LV systolic function, moderate to severe mitral regurgitation and moderate left atrial enlargement. Seen recently with increased lower extremity edema. Lasix added with improvement. Since last seen, the patient denies any dyspnea on exertion, orthopnea, PND, pedal edema, palpitations, syncope or chest pain.   Current Outpatient Prescriptions  Medication Sig Dispense Refill  . alfuzosin (UROXATRAL) 10 MG 24 hr tablet Take 10 mg by mouth daily.     [name] apixaban (ELIQUIS) 5 MG TABS tablet Take 1 tablet (5 mg total) by mouth 2 (two) times daily. 180 tablet 3  . atorvastatin (LIPITOR) 80 MG tablet Take 80 mg by mouth every evening.    . furosemide (LASIX) 40 MG tablet Take 1 tablet (40 mg total) by mouth daily. 30 tablet 1  . metoprolol tartrate (LOPRESSOR) 25 MG tablet Take 1.5 tablets (37.5 mg total) by mouth 2 (two) times daily. 180 tablet 3  . pantoprazole (PROTONIX) 40 MG tablet Take 1 tablet (40 mg total) by mouth daily. 90 tablet 3  . potassium chloride SA (K-DUR,KLOR-CON) 20 MEQ tablet Take 1 tablet (20 mEq total) by mouth daily. 35 tablet 1   No current facility-administered medications for this visit.      Past Medical History:  Diagnosis Date  . Angina    none since CABG  . Arthritis   . Bladder spasm    takes Levsin as needed  . CAD (coronary artery disease)   . Diverticulosis   . Duodenal ulcer    hx of  . Dysrhythmia    atrial fib post Cabg/  takes Eliquis daily as well as Metoprolol   . GERD (gastroesophageal reflux disease)    takes Protonix daily  . History of blood transfusion    not abnormal reaction  . History of colon polyps    benign  . History of kidney stones    hx of  . Hyperlipidemia    takes Atorvastatin daily  . HYPERLIPIDEMIA 05/25/2009  . Joint pain   . Nocturia   . PAF (paroxysmal atrial fibrillation) ([place])     Past Surgical History:  Procedure Laterality Date  . CARDIAC CATHETERIZATION     6/12  . COLONOSCOPY    . CORONARY ARTERY BYPASS GRAFT  03/27/2011   x3, Dr Tharon Aquas Trigt    . ESOPHAGOGASTRODUODENOSCOPY ENDOSCOPY  04/12/11   cauterization of bleeding ulcer  and artery in duodenum and stomach   . INGUINAL HERNIA REPAIR  11/06/2011   Procedure: HERNIA REPAIR INGUINAL ADULT;  Surgeon: Earnstine Regal, MD;  Location: [facility];  Service: General;  Laterality: N/A;  Repair left inguinal hernia with mesh  . WISDOM TOOTH EXTRACTION      Social History   Social History  . Marital status: Married    Spouse name: N/A  . Number of children: 4  . Years of education: N/A   Occupational History  . retired     Social History Main Topics  . Smoking status: Former Smoker    Packs/day: 1.00  Years: 25.00    Quit date: 03/04/1988  . Smokeless tobacco: Never Used  . Alcohol use 0.6 oz/week    1 Cans of beer per week     Comment: occ  . Drug use: No  . Sexual activity: Not on file   Other Topics Concern  . Not on file   Social History Narrative   Regular exercise- yes     Family History  Problem Relation Age of Onset  . Coronary artery disease Father   . Heart attack Father 53  . Cancer Father   . Lymphoma Unknown   . Diabetes Sister   . Heart attack Sister 67  . Ovarian cancer Sister   . Atrial fibrillation Brother   . Colon cancer Neg Hx   . Stomach cancer Neg Hx     ROS: Shoulder pain no fevers or chills, productive cough, hemoptysis, dysphasia, odynophagia, melena, hematochezia,  dysuria, hematuria, rash, seizure activity, orthopnea, PND, pedal edema, claudication. Remaining systems are negative.  Physical Exam: Well-developed well-nourished in no acute distress.  Skin is warm and dry.  HEENT is normal.  Neck is supple.  Chest is clear to auscultation with normal expansion.  Cardiovascular exam is irregular Abdominal exam nontender or distended. No masses palpated. Extremities show no edema. neuro grossly intact   A/P  1 permanent atrial fibrillation-plan rate control and anticoagulation. Continue present dose of metoprolol. Continue apixaban.  2 Coronary artery disease-continue statin. No aspirin given need for anticoagulation.  3 hypertension-blood pressure is controlled. Continue present medications.  4 hyperlipidemia-continue statin.  5 Chronic diastolic CHF-patient seen recently for edema following shoulder surgery. Lasix was added with improvement. He now has no edema. He would like to decrease Lasix and we will reduce to 20 mg daily. Follow symptoms and weight. He will take an additional 20 mg for weight gain of 2-3 pounds. Decrease potassium to 10 meq daily. Patient instructed on low sodium diet.   6 Moderate to severe MR-patient appears to be improved on diuretics. We will plan follow-up echoes in the future. If he has worsening CHF symptoms we will need to consider transesophageal echocardiogram to further assess mitral regurgitation. It appears to be 3+ on transthoracic echocardiogram.  Kirk Ruths, MD

## 2017-03-12 ENCOUNTER — Ambulatory Visit: Payer: Medicare Other | Admitting: Physical Therapy

## 2017-03-12 ENCOUNTER — Encounter: Payer: Self-pay | Admitting: Physical Therapy

## 2017-03-12 DIAGNOSIS — M6281 Muscle weakness (generalized): Secondary | ICD-10-CM

## 2017-03-12 DIAGNOSIS — M25611 Stiffness of right shoulder, not elsewhere classified: Secondary | ICD-10-CM

## 2017-03-12 DIAGNOSIS — M25511 Pain in right shoulder: Secondary | ICD-10-CM

## 2017-03-12 NOTE — Therapy (Signed)
[place], [place], [address] Phone: [phone]   Fax:  [phone]  Physical Therapy Treatment  Patient Details  Name: Blake [name] MRN: [id number] Date of Birth: 29-Jul-1946 Referring Provider: Joni Fears  Encounter Date: 03/12/2017      PT End of Session - 03/12/17 0928    Visit Number 4   Number of Visits 25   Date for PT Re-Evaluation 05/03/17   Authorization Type UHC MCR, certified through 4/74,    PT 6 week re eval by 5/30   PT Start Time 0929   PT Stop Time 1011   PT Time Calculation (min) 42 min   Activity Tolerance Patient tolerated treatment well   Behavior During Therapy [facility] for tasks assessed/performed      Past Medical History:  Diagnosis Date  . Angina    none since CABG  . Arthritis   . Bladder spasm    takes Levsin as needed  . CAD (coronary artery disease)   . Diverticulosis   . Duodenal ulcer    hx of  . Dysrhythmia    atrial fib post Cabg/ takes Eliquis daily as well as Metoprolol   . GERD (gastroesophageal reflux disease)    takes Protonix daily  . History of blood transfusion    not abnormal reaction  . History of colon polyps    benign  . History of kidney stones    hx of  . Hyperlipidemia    takes Atorvastatin daily  . HYPERLIPIDEMIA 05/25/2009  . Joint pain   . Nocturia   . PAF (paroxysmal atrial fibrillation) ([place])     Past Surgical History:  Procedure Laterality Date  . CARDIAC CATHETERIZATION     6/12  . COLONOSCOPY    . CORONARY ARTERY BYPASS GRAFT  03/27/2011   x3, Dr Tharon Aquas Trigt    . ESOPHAGOGASTRODUODENOSCOPY ENDOSCOPY  04/12/11   cauterization of bleeding ulcer  and artery in duodenum and stomach   . INGUINAL HERNIA REPAIR  11/06/2011   Procedure: HERNIA REPAIR INGUINAL ADULT;  Surgeon: Earnstine Regal, MD;  Location: [facility];  Service: General;  Laterality: N/A;  Repair left inguinal hernia with mesh  . WISDOM TOOTH EXTRACTION       There were no vitals filed for this visit.      Subjective Assessment - 03/12/17 0929    Subjective Pt reports minimal soreness, exercises are easily done without causing pain.    Patient Stated Goals do everything I could before the shoulder pain, woodworking                         [facility] Adult PT Treatment/Exercise - 03/12/17 0001      Shoulder Exercises: Seated   Other Seated Exercises velcro board R arm     Shoulder Exercises: Pulleys   Flexion 3 minutes     Shoulder Exercises: ROM/Strengthening   Other ROM/Strengthening Exercises roller on table flexion, scaption     Cryotherapy   Number Minutes Cryotherapy 10 Minutes  3 min concurrent with edu   Cryotherapy Location Shoulder  R   Type of Cryotherapy Ice pack     Manual Therapy   Soft tissue mobilization R upper trap   Passive ROM R GHJ flexion, scaption, ER                  PT Short Term Goals - 03/08/17 1056      PT  SHORT TERM GOAL #1   Title PROM within 10 deg of L upper extremity by 5/18   Baseline see flowsheet   Status On-going     PT SHORT TERM GOAL #2   Title Pt will be independent with HEP as it has progressed on 5/18   Baseline verbalizes independence and compliance as it has been established at this point, will continue to progress as appropriate   Status Achieved           PT Long Term Goals - 02/06/17 1606      PT LONG TERM GOAL #1   Title Pt will be able to use L UE to reach overhead and grab dishes from overhead cabinets without increase in pain by 7/13   Baseline unable at eval   Time 12   Period Weeks   Status New     PT LONG TERM GOAL #2   Title Pt will be able to return to light activities of wood working with minimal limitation by shoulder to return to PLOF.    Baseline unable at eval   Time 12   Period Weeks   Status New     PT LONG TERM GOAL #3   Title AROM within 15 deg of R upper extremity to improve functional use of arm   Baseline  unable to perform AROM at eval   Time 12   Period Weeks   Status New     PT LONG TERM GOAL #4   Title GHJ MMT grossly 4+/5 to indicate proper support to GHJ by surrounding soft tissue.    Baseline unable to MMT at eval   Time 12   Period Weeks   Status New     PT LONG TERM GOAL #5   Title FOTO to 68% ability to indicate significant improvement in functional ability   Baseline 29% ability at eval   Time 12   Period Weeks   Status New               Plan - 03/12/17 1000    Clinical Impression Statement cuing required for upright posture, pt tends to fall to kyphotic posture with forward head and rounded shoulders. Able to passively move through flexion and scaption approx 110, pt very guarded stopping progress. ER to approx 20 deg. Educated on progress through treatment and importance of keeping pain to a minimum with stretching to avoid excessive inflammation.    PT Next Visit Plan ROM-passive, AA with wand   PT Home Exercise Plan scapular retraction, putty work, ice, resting on pillow, isometric IR & Ext, PROM flexion & ER   Consulted and Agree with Plan of Care Patient      Patient will benefit from skilled therapeutic intervention in order to improve the following deficits and impairments:     Visit Diagnosis: Acute pain of right shoulder  Muscle weakness (generalized)  Stiffness of right shoulder, not elsewhere classified     Problem List Patient Active Problem List   Diagnosis Date Noted  . Mitral valve insufficiency   . Diastolic dysfunction with chronic heart failure ([place]) 02/12/2017  . AC (acromioclavicular) arthritis 01/29/2017  . Spinal stenosis of lumbar region at multiple levels 12/01/2015  . Routine general medical examination at a health care facility 11/08/2015  . Obesity (BMI 30-39.9) 10/28/2012  . BPH (benign prostatic hyperplasia) 06/08/2011  . Gastric ulcer 06/08/2011  . Coronary artery disease due to lipid rich plaque 05/03/2011  . Atrial  fibrillation, chronic ([place]) 04/05/2011  .  Dyslipidemia, goal LDL below 70 05/25/2009  . BINGE EATING DISORDER 05/25/2009    Jerzy Roepke C. Amoni Morales PT, DPT 03/12/17 10:06 AM   [facility] [address] [place], [place], [address] Phone: [phone]   Fax:  [phone]  Name: Blake [name] MRN: [id number] Date of Birth: May 01, 1946

## 2017-03-13 ENCOUNTER — Encounter (INDEPENDENT_AMBULATORY_CARE_PROVIDER_SITE_OTHER): Payer: Self-pay | Admitting: Orthopedic Surgery

## 2017-03-13 ENCOUNTER — Encounter: Payer: Self-pay | Admitting: Physical Therapy

## 2017-03-13 ENCOUNTER — Ambulatory Visit: Payer: Medicare Other | Admitting: Physical Therapy

## 2017-03-13 ENCOUNTER — Ambulatory Visit (INDEPENDENT_AMBULATORY_CARE_PROVIDER_SITE_OTHER): Payer: Medicare Other | Admitting: Orthopedic Surgery

## 2017-03-13 VITALS — BP 97/62 | HR 88 | Resp 12 | Ht 68.0 in | Wt 184.0 lb

## 2017-03-13 DIAGNOSIS — Z9889 Other specified postprocedural states: Secondary | ICD-10-CM

## 2017-03-13 DIAGNOSIS — M6281 Muscle weakness (generalized): Secondary | ICD-10-CM

## 2017-03-13 DIAGNOSIS — M25511 Pain in right shoulder: Secondary | ICD-10-CM

## 2017-03-13 DIAGNOSIS — M25611 Stiffness of right shoulder, not elsewhere classified: Secondary | ICD-10-CM | POA: Diagnosis not present

## 2017-03-13 DIAGNOSIS — M75121 Complete rotator cuff tear or rupture of right shoulder, not specified as traumatic: Secondary | ICD-10-CM

## 2017-03-13 NOTE — Therapy (Signed)
[place], [place], [address] Phone: [phone]   Fax:  [phone]  Physical Therapy Treatment  Patient Details  Name: Blake [name] MRN: [id number] Date of Birth: 01-13-1946 Referring Provider: Joni Fears  Encounter Date: 03/13/2017      PT End of Session - 03/13/17 1329    Visit Number 5   Number of Visits 25   Date for PT Re-Evaluation 05/03/17   Authorization Type UHC MCR, certified through 5/32,    PT 6 week re eval by 5/30   PT Start Time 1331   PT Stop Time 1420   PT Time Calculation (min) 49 min   Activity Tolerance Patient tolerated treatment well   Behavior During Therapy [facility] for tasks assessed/performed      Past Medical History:  Diagnosis Date  . Angina    none since CABG  . Arthritis   . Bladder spasm    takes Levsin as needed  . CAD (coronary artery disease)   . Diverticulosis   . Duodenal ulcer    hx of  . Dysrhythmia    atrial fib post Cabg/ takes Eliquis daily as well as Metoprolol   . GERD (gastroesophageal reflux disease)    takes Protonix daily  . History of blood transfusion    not abnormal reaction  . History of colon polyps    benign  . History of kidney stones    hx of  . Hyperlipidemia    takes Atorvastatin daily  . HYPERLIPIDEMIA 05/25/2009  . Joint pain   . Nocturia   . PAF (paroxysmal atrial fibrillation) ([place])     Past Surgical History:  Procedure Laterality Date  . CARDIAC CATHETERIZATION     6/12  . COLONOSCOPY    . CORONARY ARTERY BYPASS GRAFT  03/27/2011   x3, Dr Tharon Aquas Trigt    . ESOPHAGOGASTRODUODENOSCOPY ENDOSCOPY  04/12/11   cauterization of bleeding ulcer  and artery in duodenum and stomach   . INGUINAL HERNIA REPAIR  11/06/2011   Procedure: HERNIA REPAIR INGUINAL ADULT;  Surgeon: Earnstine Regal, MD;  Location: [facility];  Service: General;  Laterality: N/A;  Repair left inguinal hernia with mesh  . WISDOM TOOTH EXTRACTION       There were no vitals filed for this visit.      Subjective Assessment - 03/13/17 1338    Subjective Pt reports mild soreness after session yesterday   Currently in Pain? Yes   Pain Score 1    Pain Location Shoulder   Pain Orientation Right   Pain Descriptors / Indicators Discomfort            [facility] PT Assessment - 03/13/17 0001      PROM   Right Shoulder Flexion 130 Degrees   Right Shoulder ABduction 90 Degrees   Right Shoulder External Rotation 18 Degrees                     [facility] Adult PT Treatment/Exercise - 03/13/17 0001      Shoulder Exercises: Stretch   Table Stretch -Flexion Limitations towel slides on table-both arms   Table Stretch - ABduction Limitations scaption towel slide R arm     Modalities   Modalities Moist Heat     Moist Heat Therapy   Number Minutes Moist Heat 5 Minutes  concurrent with education and discussion beg of session   Moist Heat Location Shoulder     Cryotherapy   Number Minutes  Cryotherapy 10 Minutes  5 min concurrent with HEP eduction   Cryotherapy Location Shoulder   Type of Cryotherapy Ice pack     Manual Therapy   Passive ROM R GHJ flexion, scaption, abduction, ER                PT Education - 03/13/17 1602    Education provided Yes   Education Details progression of ROM, mainpulation if lacking ROM, HEP, exercise form/rationale, POC   Person(s) Educated Patient   Methods Explanation;Demonstration;Tactile cues;Verbal cues;Handout   Comprehension Verbalized understanding;Returned demonstration;Verbal cues required;Tactile cues required;Need further instruction          PT Short Term Goals - 03/08/17 1056      PT SHORT TERM GOAL #1   Title PROM within 10 deg of L upper extremity by 5/18   Baseline see flowsheet   Status On-going     PT SHORT TERM GOAL #2   Title Pt will be independent with HEP as it has progressed on 5/18   Baseline verbalizes independence and compliance as it has been  established at this point, will continue to progress as appropriate   Status Achieved           PT Long Term Goals - 02/06/17 1606      PT LONG TERM GOAL #1   Title Pt will be able to use L UE to reach overhead and grab dishes from overhead cabinets without increase in pain by 7/13   Baseline unable at eval   Time 12   Period Weeks   Status New     PT LONG TERM GOAL #2   Title Pt will be able to return to light activities of wood working with minimal limitation by shoulder to return to PLOF.    Baseline unable at eval   Time 12   Period Weeks   Status New     PT LONG TERM GOAL #3   Title AROM within 15 deg of R upper extremity to improve functional use of arm   Baseline unable to perform AROM at eval   Time 12   Period Weeks   Status New     PT LONG TERM GOAL #4   Title GHJ MMT grossly 4+/5 to indicate proper support to GHJ by surrounding soft tissue.    Baseline unable to MMT at eval   Time 12   Period Weeks   Status New     PT LONG TERM GOAL #5   Title FOTO to 68% ability to indicate significant improvement in functional ability   Baseline 29% ability at eval   Time 12   Period Weeks   Status New               Plan - 03/13/17 1558    Clinical Impression Statement Pt has made consistent improvements in ROM and has minimal soreness associated. Pt is nervous about PA-C talking about manipulation and we discussed stretching and progress but to be careful not to over stretch. Pt was agreeable and understanding of HEP and will progress as appropraite.    PT Home Exercise Plan scapular retraction, putty work, ice, resting on pillow, isometric IR, ER, flexion, ext , PROM flexion & ER; table slides flexion & scaption   Consulted and Agree with Plan of Care Patient      Patient will benefit from skilled therapeutic intervention in order to improve the following deficits and impairments:     Visit Diagnosis: Acute pain of right  shoulder  Muscle weakness  (generalized)  Stiffness of right shoulder, not elsewhere classified     Problem List Patient Active Problem List   Diagnosis Date Noted  . Mitral valve insufficiency   . Diastolic dysfunction with chronic heart failure ([place]) 02/12/2017  . AC (acromioclavicular) arthritis 01/29/2017  . Spinal stenosis of lumbar region at multiple levels 12/01/2015  . Routine general medical examination at a health care facility 11/08/2015  . Obesity (BMI 30-39.9) 10/28/2012  . BPH (benign prostatic hyperplasia) 06/08/2011  . Gastric ulcer 06/08/2011  . Coronary artery disease due to lipid rich plaque 05/03/2011  . Atrial fibrillation, chronic ([place]) 04/05/2011  . Dyslipidemia, goal LDL below 70 05/25/2009  . BINGE EATING DISORDER 05/25/2009    Chalese Peach C. Sabra Sessler PT, DPT 03/13/17 4:04 PM   [facility] [address] [place], [place], [address] Phone: [phone]   Fax:  [phone]  Name: Blake [name] MRN: [id number] Date of Birth: [birth year]

## 2017-03-13 NOTE — Progress Notes (Signed)
Office Visit Note   Patient: Blake [name]           Date of Birth: [birth year]           MRN: [id number] Visit Date: 03/13/2017              Requested by: Janith Lima, MD [address] [place], [place] [address] PCP: Janith Lima, MD   Assessment & Plan: Visit Diagnoses:  1. Status post right rotator cuff repair   2. Complete tear of right rotator cuff   3. Status post arthroscopy of right shoulder     Plan:  #1: Continue physical therapy as per protocol #2: Continue home exercise program as instructed #3: Return in 3 weeks if he has not made progress and he may need a shoulder manipulation for his adhesive capsulitis.  Follow-Up Instructions: Return in about 3 weeks (around 04/03/2017).   Orders:  No orders of the defined types were placed in this encounter.  No orders of the defined types were placed in this encounter.     Procedures: No procedures performed   Clinical Data: No additional findings.   Subjective: Chief Complaint  Patient presents with  . Right Shoulder - Routine Post Op    Blake [name] is 6 weeks status post Right shoulder replacement. He relates he is doing very well and his PT agrees he is ahead of schedule. He states he will be starting active ROM on next PT visit.    6 weeks status post rotator cuff tear repair right shoulder. Had been compromised with his PT secondary to developing congestive heart failure and had developed an early adhesive capsulitis.  He was sent for aggressive PT as well as instructing his wife in appropriate exercises. He has removed his sling. Seen today for evaluation.    Review of Systems   Objective: Vital Signs: BP 97/62   Pulse 88   Resp 12   Ht 5\' 8"  (1.727 m)   Wt 184 lb (83.5 kg)   BMI 27.98 kg/m   Physical Exam  Ortho Exam  Today he has approximately 60 of abduction 80-90 of forward flexion. With his arm at his side he has no external rotation in neutral position. Internally  rotates hand to his abdomen. Wounds are well-healed. Neurovascular intact distally.  Specialty Comments:  No specialty comments available.  Imaging: No results found.   PMFS History: Patient Active Problem List   Diagnosis Date Noted  . Mitral valve insufficiency   . Diastolic dysfunction with chronic heart failure ([place]) 02/12/2017  . AC (acromioclavicular) arthritis 01/29/2017  . Spinal stenosis of lumbar region at multiple levels 12/01/2015  . Routine general medical examination at a health care facility 11/08/2015  . Obesity (BMI 30-39.9) 10/28/2012  . BPH (benign prostatic hyperplasia) 06/08/2011  . Gastric ulcer 06/08/2011  . Coronary artery disease due to lipid rich plaque 05/03/2011  . Atrial fibrillation, chronic ([place]) 04/05/2011  . Dyslipidemia, goal LDL below 70 05/25/2009  . BINGE EATING DISORDER 05/25/2009   Past Medical History:  Diagnosis Date  . Angina    none since CABG  . Arthritis   . Bladder spasm    takes Levsin as needed  . CAD (coronary artery disease)   . Diverticulosis   . Duodenal ulcer    hx of  . Dysrhythmia    atrial fib post Cabg/ takes Eliquis daily as well as Metoprolol   . GERD (gastroesophageal reflux disease)  takes Protonix daily  . History of blood transfusion    not abnormal reaction  . History of colon polyps    benign  . History of kidney stones    hx of  . Hyperlipidemia    takes Atorvastatin daily  . HYPERLIPIDEMIA 05/25/2009  . Joint pain   . Nocturia   . PAF (paroxysmal atrial fibrillation) ([facility])     Family History  Problem Relation Age of Onset  . Coronary artery disease Father   . Heart attack Father 43  . Cancer Father   . Lymphoma Unknown   . Diabetes Sister   . Heart attack Sister 30  . Ovarian cancer Sister   . Atrial fibrillation Brother   . Colon cancer Neg Hx   . Stomach cancer Neg Hx     Past Surgical History:  Procedure Laterality Date  . CARDIAC CATHETERIZATION     6/12  . COLONOSCOPY    .  CORONARY ARTERY BYPASS GRAFT  03/27/2011   x3, Dr Tharon Aquas Trigt    . ESOPHAGOGASTRODUODENOSCOPY ENDOSCOPY  04/12/11   cauterization of bleeding ulcer  and artery in duodenum and stomach   . INGUINAL HERNIA REPAIR  11/06/2011   Procedure: HERNIA REPAIR INGUINAL ADULT;  Surgeon: Earnstine Regal, MD;  Location: [facility];  Service: General;  Laterality: N/A;  Repair left inguinal hernia with mesh  . WISDOM TOOTH EXTRACTION     Social History   Occupational History  . retired     Social History Main Topics  . Smoking status: Former Smoker    Packs/day: 1.00    Years: 25.00    Quit date: 03/04/1988  . Smokeless tobacco: Never Used  . Alcohol use 0.6 oz/week    1 Cans of beer per week     Comment: occ  . Drug use: No  . Sexual activity: Not on file

## 2017-03-14 ENCOUNTER — Ambulatory Visit (INDEPENDENT_AMBULATORY_CARE_PROVIDER_SITE_OTHER): Payer: Medicare Other | Admitting: Cardiology

## 2017-03-14 ENCOUNTER — Encounter: Payer: Self-pay | Admitting: Cardiology

## 2017-03-14 VITALS — BP 94/62 | HR 68 | Ht 66.0 in | Wt 182.6 lb

## 2017-03-14 DIAGNOSIS — I10 Essential (primary) hypertension: Secondary | ICD-10-CM

## 2017-03-14 DIAGNOSIS — E7800 Pure hypercholesterolemia, unspecified: Secondary | ICD-10-CM

## 2017-03-14 DIAGNOSIS — I4821 Permanent atrial fibrillation: Secondary | ICD-10-CM

## 2017-03-14 DIAGNOSIS — I2510 Atherosclerotic heart disease of native coronary artery without angina pectoris: Secondary | ICD-10-CM | POA: Diagnosis not present

## 2017-03-14 DIAGNOSIS — I482 Chronic atrial fibrillation: Secondary | ICD-10-CM | POA: Diagnosis not present

## 2017-03-14 MED ORDER — FUROSEMIDE 40 MG PO TABS: 40.0000 mg | ORAL_TABLET | Freq: Every day | ORAL | 3 refills | Status: DC

## 2017-03-14 NOTE — Patient Instructions (Addendum)
Medication Instructions:  DECREASE: Lasix 20 mg daily  Labwork: None Ordered  Testing/Procedures: None Ordered  Follow-Up: Your physician recommends that you schedule a follow-up appointment in: 3 Months.   Any Other Special Instructions Will Be Listed Below (If Applicable).   If you need a refill on your cardiac medications before your next appointment, please call your pharmacy.

## 2017-03-20 ENCOUNTER — Encounter: Payer: Self-pay | Admitting: Physical Therapy

## 2017-03-20 ENCOUNTER — Ambulatory Visit: Payer: Medicare Other | Admitting: Physical Therapy

## 2017-03-20 DIAGNOSIS — M6281 Muscle weakness (generalized): Secondary | ICD-10-CM

## 2017-03-20 DIAGNOSIS — M25511 Pain in right shoulder: Secondary | ICD-10-CM

## 2017-03-20 DIAGNOSIS — M25611 Stiffness of right shoulder, not elsewhere classified: Secondary | ICD-10-CM

## 2017-03-20 NOTE — Therapy (Signed)
[place], [place], [address] Phone: [phone]   Fax:  [phone]  Physical Therapy Treatment  Patient Details  Name: Blake [name] MRN: [id number] Date of Birth: 06-28-1946 Referring Provider: Joni Fears  Encounter Date: 03/20/2017      PT End of Session - 03/20/17 1152    Visit Number 6   Number of Visits 25   Date for PT Re-Evaluation 05/03/17   Authorization Type UHC MCR, certified through 3/41,    PT Start Time 1150   PT Stop Time 1225   PT Time Calculation (min) 35 min   Activity Tolerance Patient tolerated treatment well   Behavior During Therapy [facility] for tasks assessed/performed      Past Medical History:  Diagnosis Date  . Angina    none since CABG  . Arthritis   . Bladder spasm    takes Levsin as needed  . CAD (coronary artery disease)   . Diverticulosis   . Duodenal ulcer    hx of  . Dysrhythmia    atrial fib post Cabg/ takes Eliquis daily as well as Metoprolol   . GERD (gastroesophageal reflux disease)    takes Protonix daily  . History of blood transfusion    not abnormal reaction  . History of colon polyps    benign  . History of kidney stones    hx of  . Hyperlipidemia    takes Atorvastatin daily  . HYPERLIPIDEMIA 05/25/2009  . Joint pain   . Nocturia   . PAF (paroxysmal atrial fibrillation) ([place])     Past Surgical History:  Procedure Laterality Date  . CARDIAC CATHETERIZATION     6/12  . COLONOSCOPY    . CORONARY ARTERY BYPASS GRAFT  03/27/2011   x3, Dr Tharon Aquas Trigt    . ESOPHAGOGASTRODUODENOSCOPY ENDOSCOPY  04/12/11   cauterization of bleeding ulcer  and artery in duodenum and stomach   . INGUINAL HERNIA REPAIR  11/06/2011   Procedure: HERNIA REPAIR INGUINAL ADULT;  Surgeon: Earnstine Regal, MD;  Location: [facility];  Service: General;  Laterality: N/A;  Repair left inguinal hernia with mesh  . WISDOM TOOTH EXTRACTION      There were no vitals filed for  this visit.      Subjective Assessment - 03/20/17 1151    Subjective Pt has made a pulley for home. Overall shoulder is doing well    Patient Stated Goals do everything I could before the shoulder pain, woodworking   Currently in Pain? Yes   Pain Score 1    Pain Location Shoulder   Pain Orientation Right   Pain Descriptors / Indicators Aching   Aggravating Factors  quick movements, lifting actively   Pain Relieving Factors rest                         [facility] Adult PT Treatment/Exercise - 03/20/17 0001      Shoulder Exercises: Supine   Flexion Limitations punch to 90 AROM     Shoulder Exercises: Seated   Other Seated Exercises biceps curls, unweighted, supination & neutral     Shoulder Exercises: Sidelying   External Rotation 20 reps     Shoulder Exercises: Standing   Other Standing Exercises upright triceps extension kick     Shoulder Exercises: Pulleys   Flexion 3 minutes     Shoulder Exercises: ROM/Strengthening   Other ROM/Strengthening Exercises ranger flexion, IR/ER     Shoulder  Exercises: Stretch   Table Stretch -Flexion Limitations supine with wand     Manual Therapy   Manual Therapy Myofascial release   Soft tissue mobilization IASTM upper trap & levator   Myofascial Release trigger point release rhomboids infraspinatus                  PT Short Term Goals - 03/08/17 1056      PT SHORT TERM GOAL #1   Title PROM within 10 deg of L upper extremity by 5/18   Baseline see flowsheet   Status On-going     PT SHORT TERM GOAL #2   Title Pt will be independent with HEP as it has progressed on 5/18   Baseline verbalizes independence and compliance as it has been established at this point, will continue to progress as appropriate   Status Achieved           PT Long Term Goals - 02/06/17 1606      PT LONG TERM GOAL #1   Title Pt will be able to use L UE to reach overhead and grab dishes from overhead cabinets without increase in  pain by 7/13   Baseline unable at eval   Time 12   Period Weeks   Status New     PT LONG TERM GOAL #2   Title Pt will be able to return to light activities of wood working with minimal limitation by shoulder to return to PLOF.    Baseline unable at eval   Time 12   Period Weeks   Status New     PT LONG TERM GOAL #3   Title AROM within 15 deg of R upper extremity to improve functional use of arm   Baseline unable to perform AROM at eval   Time 12   Period Weeks   Status New     PT LONG TERM GOAL #4   Title GHJ MMT grossly 4+/5 to indicate proper support to GHJ by surrounding soft tissue.    Baseline unable to MMT at eval   Time 12   Period Weeks   Status New     PT LONG TERM GOAL #5   Title FOTO to 68% ability to indicate significant improvement in functional ability   Baseline 29% ability at eval   Time 12   Period Weeks   Status New               Plan - 03/20/17 1238    Clinical Impression Statement Initiated AROM exercises today which pt tolerated well. Educated on importance of being aware of movement to avoid creating poor biomechanical movement. Will assess soreness, if any, created with AROM and progress HEP as necessary.    PT Treatment/Interventions ADLs/Self Care Home Management;Cryotherapy;Electrical Stimulation;Functional mobility training;Traction;Moist Heat;Therapeutic activities;Therapeutic exercise;Neuromuscular re-education;Patient/family education;Passive range of motion;Scar mobilization;Manual techniques;Taping;Dry needling   PT Next Visit Plan AROM as tol, AAROM   PT Home Exercise Plan scapular retraction, putty work, ice, resting on pillow, isometric IR, ER, flexion, ext , PROM flexion & ER; table slides flexion & scaption   Consulted and Agree with Plan of Care Patient      Patient will benefit from skilled therapeutic intervention in order to improve the following deficits and impairments:  Decreased range of motion, Impaired UE functional  use, Increased muscle spasms, Decreased activity tolerance, Pain, Improper body mechanics, Impaired flexibility, Decreased scar mobility, Decreased mobility, Decreased strength, Postural dysfunction  Visit Diagnosis: Acute pain of right shoulder  Muscle  weakness (generalized)  Stiffness of right shoulder, not elsewhere classified     Problem List Patient Active Problem List   Diagnosis Date Noted  . Mitral valve insufficiency   . Diastolic dysfunction with chronic heart failure ([place]) 02/12/2017  . AC (acromioclavicular) arthritis 01/29/2017  . Spinal stenosis of lumbar region at multiple levels 12/01/2015  . Routine general medical examination at a health care facility 11/08/2015  . Obesity (BMI 30-39.9) 10/28/2012  . BPH (benign prostatic hyperplasia) 06/08/2011  . Gastric ulcer 06/08/2011  . Coronary artery disease due to lipid rich plaque 05/03/2011  . Atrial fibrillation, chronic ([place]) 04/05/2011  . Dyslipidemia, goal LDL below 70 05/25/2009  . BINGE EATING DISORDER 05/25/2009   Greenley Martone C. Marlee Armenteros PT, DPT 03/20/17 12:42 PM   [facility] [address] [place], [place], [address] Phone: [phone]   Fax:  [phone]  Name: Blake [name] MRN: [id number] Date of Birth: 05/07/1946

## 2017-03-21 ENCOUNTER — Encounter: Payer: Self-pay | Admitting: Physical Therapy

## 2017-03-21 ENCOUNTER — Ambulatory Visit: Payer: Medicare Other | Admitting: Physical Therapy

## 2017-03-21 DIAGNOSIS — M6281 Muscle weakness (generalized): Secondary | ICD-10-CM

## 2017-03-21 DIAGNOSIS — M25611 Stiffness of right shoulder, not elsewhere classified: Secondary | ICD-10-CM | POA: Diagnosis not present

## 2017-03-21 DIAGNOSIS — M25511 Pain in right shoulder: Secondary | ICD-10-CM

## 2017-03-21 NOTE — Therapy (Signed)
[place], [place], [address] Phone: [phone]   Fax:  [phone]  Physical Therapy Treatment  Patient Details  Name: LAVERNE KLUGH MRN: [id number] Date of Birth: 1946-01-01 Referring Provider: Joni Fears  Encounter Date: 03/21/2017      PT End of Session - 03/21/17 0931    Visit Number 7   Number of Visits 25   Authorization Type UHC MCR, certified through 3/41,    PT Start Time 0931   PT Stop Time 1010   PT Time Calculation (min) 39 min   Activity Tolerance Patient tolerated treatment well   Behavior During Therapy [facility] for tasks assessed/performed      Past Medical History:  Diagnosis Date  . Angina    none since CABG  . Arthritis   . Bladder spasm    takes Levsin as needed  . CAD (coronary artery disease)   . Diverticulosis   . Duodenal ulcer    hx of  . Dysrhythmia    atrial fib post Cabg/ takes Eliquis daily as well as Metoprolol   . GERD (gastroesophageal reflux disease)    takes Protonix daily  . History of blood transfusion    not abnormal reaction  . History of colon polyps    benign  . History of kidney stones    hx of  . Hyperlipidemia    takes Atorvastatin daily  . HYPERLIPIDEMIA 05/25/2009  . Joint pain   . Nocturia   . PAF (paroxysmal atrial fibrillation) ([place])     Past Surgical History:  Procedure Laterality Date  . CARDIAC CATHETERIZATION     6/12  . COLONOSCOPY    . CORONARY ARTERY BYPASS GRAFT  03/27/2011   x3, Dr Tharon Aquas Trigt    . ESOPHAGOGASTRODUODENOSCOPY ENDOSCOPY  04/12/11   cauterization of bleeding ulcer  and artery in duodenum and stomach   . INGUINAL HERNIA REPAIR  11/06/2011   Procedure: HERNIA REPAIR INGUINAL ADULT;  Surgeon: Earnstine Regal, MD;  Location[facility];  Service: General;  Laterality: N/A;  Repair left inguinal hernia with mesh  . WISDOM TOOTH EXTRACTION      There were no vitals filed for this visit.      Subjective  Assessment - 03/21/17 0931    Subjective Shoulder is feeling good. Most soreness notable in biceps   Patient Stated Goals do everything I could before the shoulder pain, woodworking                         [facility] Adult PT Treatment/Exercise - 03/21/17 0001      Shoulder Exercises: Supine   Protraction Limitations wand reach at 90 GHJ flx   Other Supine Exercises triceps extension resisted by yellow tband   Other Supine Exercises AROM-ER     Shoulder Exercises: Seated   Other Seated Exercises ranger flexion, scaption     Shoulder Exercises: Pulleys   Flexion 3 minutes     Shoulder Exercises: ROM/Strengthening   Other ROM/Strengthening Exercises supine wand flexion     Manual Therapy   Soft tissue mobilization IASTM R biceps   Passive ROM ext rot at 80 abd                PT Education - 03/21/17 0933    Education provided Yes   Education Details exercise form/rationale, exercise alterations when sore, importance of postural alignment, sleeping positions   Person(s) Educated Patient   Methods  Explanation;Demonstration;Tactile cues;Verbal cues;Handout   Comprehension Verbalized understanding;Returned demonstration;Verbal cues required;Tactile cues required;Need further instruction          PT Short Term Goals - 03/08/17 1056      PT SHORT TERM GOAL #1   Title PROM within 10 deg of L upper extremity by 5/18   Baseline see flowsheet   Status On-going     PT SHORT TERM GOAL #2   Title Pt will be independent with HEP as it has progressed on 5/18   Baseline verbalizes independence and compliance as it has been established at this point, will continue to progress as appropriate   Status Achieved           PT Long Term Goals - 02/06/17 1606      PT LONG TERM GOAL #1   Title Pt will be able to use L UE to reach overhead and grab dishes from overhead cabinets without increase in pain by 7/13   Baseline unable at eval   Time 12   Period Weeks    Status New     PT LONG TERM GOAL #2   Title Pt will be able to return to light activities of wood working with minimal limitation by shoulder to return to PLOF.    Baseline unable at eval   Time 12   Period Weeks   Status New     PT LONG TERM GOAL #3   Title AROM within 15 deg of R upper extremity to improve functional use of arm   Baseline unable to perform AROM at eval   Time 12   Period Weeks   Status New     PT LONG TERM GOAL #4   Title GHJ MMT grossly 4+/5 to indicate proper support to GHJ by surrounding soft tissue.    Baseline unable to MMT at eval   Time 12   Period Weeks   Status New     PT LONG TERM GOAL #5   Title FOTO to 68% ability to indicate significant improvement in functional ability   Baseline 29% ability at eval   Time 12   Period Weeks   Status New               Plan - 03/21/17 1011    Clinical Impression Statement Good tolerance to AROM but pt has a tendency to overuse biceps and required heavy cuing for form. Discussed muscle compensations and inappropriate  soreness that would require exercise changes so the work is able to be focused at shoulder. Decrease in biceps pain noted following IASMT.    PT Treatment/Interventions ADLs/Self Care Home Management;Cryotherapy;Electrical Stimulation;Functional mobility training;Traction;Moist Heat;Therapeutic activities;Therapeutic exercise;Neuromuscular re-education;Patient/family education;Passive range of motion;Scar mobilization;Manual techniques;Taping;Dry needling   PT Next Visit Plan AROM as tol, AAROM, cont to stretch PROM- pt with less guarding while self stretching   PT Home Exercise Plan scapular retraction, putty work, ice, resting on pillow, isometric IR, ER, flexion, ext , PROM flexion & ER; table slides flexion & scaption; supine protraction, sidelying ER, supine flexion with wand.    Consulted and Agree with Plan of Care Patient      Patient will benefit from skilled therapeutic intervention  in order to improve the following deficits and impairments:  Decreased range of motion, Impaired UE functional use, Increased muscle spasms, Decreased activity tolerance, Pain, Improper body mechanics, Impaired flexibility, Decreased scar mobility, Decreased mobility, Decreased strength, Postural dysfunction  Visit Diagnosis: Acute pain of right shoulder  Muscle weakness (generalized)  Stiffness of right shoulder, not elsewhere classified     Problem List Patient Active Problem List   Diagnosis Date Noted  . Mitral valve insufficiency   . Diastolic dysfunction with chronic heart failure ([place]) 02/12/2017  . AC (acromioclavicular) arthritis 01/29/2017  . Spinal stenosis of lumbar region at multiple levels 12/01/2015  . Routine general medical examination at a health care facility 11/08/2015  . Obesity (BMI 30-39.9) 10/28/2012  . BPH (benign prostatic hyperplasia) 06/08/2011  . Gastric ulcer 06/08/2011  . Coronary artery disease due to lipid rich plaque 05/03/2011  . Atrial fibrillation, chronic ([place]) 04/05/2011  . Dyslipidemia, goal LDL below 70 05/25/2009  . BINGE EATING DISORDER 05/25/2009   Maecyn Panning C. Seona Clemenson PT, DPT 03/21/17 10:13 AM   [facility] [address] [place], [place], [address] Phone: [phone]   Fax:  [phone]  Name: VASHAUN OSMON MRN: [id number] Date of Birth: May 19, 1946

## 2017-03-27 ENCOUNTER — Encounter: Payer: Self-pay | Admitting: Physical Therapy

## 2017-03-27 ENCOUNTER — Ambulatory Visit: Payer: Medicare Other | Attending: Orthopaedic Surgery | Admitting: Physical Therapy

## 2017-03-27 DIAGNOSIS — M25611 Stiffness of right shoulder, not elsewhere classified: Secondary | ICD-10-CM | POA: Diagnosis not present

## 2017-03-27 DIAGNOSIS — M6281 Muscle weakness (generalized): Secondary | ICD-10-CM | POA: Diagnosis not present

## 2017-03-27 DIAGNOSIS — M25511 Pain in right shoulder: Secondary | ICD-10-CM | POA: Insufficient documentation

## 2017-03-27 NOTE — Therapy (Signed)
[place], [place], [address] Phone: [phone]   Fax:  [phone]  Physical Therapy Treatment  Patient Details  Name: Blake [name] MRN: [id number] Date of Birth: 12/10/1945 Referring Provider: Joni Fears  Encounter Date: 03/27/2017      PT End of Session - 03/27/17 0931    Visit Number 8   Number of Visits 25   Date for PT Re-Evaluation 05/03/17   Authorization Type UHC MCR, certified through 6/78,    PT Start Time 0932   PT Stop Time 1016   PT Time Calculation (min) 44 min   Activity Tolerance Patient tolerated treatment well   Behavior During Therapy [facility] for tasks assessed/performed      Past Medical History:  Diagnosis Date  . Angina    none since CABG  . Arthritis   . Bladder spasm    takes Levsin as needed  . CAD (coronary artery disease)   . Diverticulosis   . Duodenal ulcer    hx of  . Dysrhythmia    atrial fib post Cabg/ takes Eliquis daily as well as Metoprolol   . GERD (gastroesophageal reflux disease)    takes Protonix daily  . History of blood transfusion    not abnormal reaction  . History of colon polyps    benign  . History of kidney stones    hx of  . Hyperlipidemia    takes Atorvastatin daily  . HYPERLIPIDEMIA 05/25/2009  . Joint pain   . Nocturia   . PAF (paroxysmal atrial fibrillation) ([place])     Past Surgical History:  Procedure Laterality Date  . CARDIAC CATHETERIZATION     6/12  . COLONOSCOPY    . CORONARY ARTERY BYPASS GRAFT  03/27/2011   x3, Dr Tharon Aquas Trigt    . ESOPHAGOGASTRODUODENOSCOPY ENDOSCOPY  04/12/11   cauterization of bleeding ulcer  and artery in duodenum and stomach   . INGUINAL HERNIA REPAIR  11/06/2011   Procedure: HERNIA REPAIR INGUINAL ADULT;  Surgeon: Earnstine Regal, MD;  Location: [facility];  Service: General;  Laterality: N/A;  Repair left inguinal hernia with mesh  . WISDOM TOOTH EXTRACTION      There were no vitals filed for  this visit.      Subjective Assessment - 03/27/17 0933    Subjective pt reports doing well with increased AROM and biceps are in pretty good shape   Patient Stated Goals do everything I could before the shoulder pain, woodworking   Currently in Pain? Yes   Pain Score 1    Pain Location Shoulder   Pain Orientation Right   Pain Descriptors / Indicators Sore   Aggravating Factors  end range   Pain Relieving Factors rest                         [facility] Adult PT Treatment/Exercise - 03/27/17 0001      Shoulder Exercises: Supine   Protraction Limitations wand reach at 90 GHJ flx+ protraction at top   Flexion Limitations supine AROM long axis- both arms moving     Shoulder Exercises: Prone   Retraction Limitations towel under shoulder     Shoulder Exercises: Sidelying   External Rotation 20 reps   External Rotation Limitations towel under elbow   ABduction 20 reps   ABduction Limitations elbow flexed at 90 for short axis, pillow under arm     Shoulder Exercises: Pulleys   Flexion  3 minutes  to 124 deg     Shoulder Exercises: ROM/Strengthening   Other ROM/Strengthening Exercises supine wand flexion     Manual Therapy   Myofascial Release R subscap & lats   Passive ROM GHJ flex, abd, ER at 90                PT Education - 03/27/17 0934    Education provided Yes   Education Details exercise form/rationale, HEP, progressing strength and balancing with stretching, reaching behind back, driving   Person(s) Educated Patient   Methods Explanation;Demonstration;Tactile cues;Verbal cues;Handout   Comprehension Verbalized understanding;Returned demonstration;Verbal cues required;Tactile cues required;Need further instruction          PT Short Term Goals - 03/08/17 1056      PT SHORT TERM GOAL #1   Title PROM within 10 deg of L upper extremity by 5/18   Baseline see flowsheet   Status On-going     PT SHORT TERM GOAL #2   Title Pt will be independent  with HEP as it has progressed on 5/18   Baseline verbalizes independence and compliance as it has been established at this point, will continue to progress as appropriate   Status Achieved           PT Long Term Goals - 02/06/17 1606      PT LONG TERM GOAL #1   Title Pt will be able to use L UE to reach overhead and grab dishes from overhead cabinets without increase in pain by 7/13   Baseline unable at eval   Time 12   Period Weeks   Status New     PT LONG TERM GOAL #2   Title Pt will be able to return to light activities of wood working with minimal limitation by shoulder to return to PLOF.    Baseline unable at eval   Time 12   Period Weeks   Status New     PT LONG TERM GOAL #3   Title AROM within 15 deg of R upper extremity to improve functional use of arm   Baseline unable to perform AROM at eval   Time 12   Period Weeks   Status New     PT LONG TERM GOAL #4   Title GHJ MMT grossly 4+/5 to indicate proper support to GHJ by surrounding soft tissue.    Baseline unable to MMT at eval   Time 12   Period Weeks   Status New     PT LONG TERM GOAL #5   Title FOTO to 68% ability to indicate significant improvement in functional ability   Baseline 29% ability at eval   Time 12   Period Weeks   Status New               Plan - 03/27/17 3888    Clinical Impression Statement Cues to relax into stretches rather than use biceps to increase motion. Able to tolerate PROM today with minimal c/o pain with cuing to relax. Will progress into long axis AROM to tolerance while continuing to stretch PROM.    PT Treatment/Interventions ADLs/Self Care Home Management;Cryotherapy;Electrical Stimulation;Functional mobility training;Traction;Moist Heat;Therapeutic activities;Therapeutic exercise;Neuromuscular re-education;Patient/family education;Passive range of motion;Scar mobilization;Manual techniques;Taping;Dry needling   PT Next Visit Plan add long axis AROM to HEP if  tolerated, FOTO   PT Home Exercise Plan scapular retraction, putty work, ice, resting on pillow, isometric IR, ER, flexion, ext , PROM flexion & ER; table slides flexion & scaption; supine protraction,  sidelying ER, supine flexion with wand.    Consulted and Agree with Plan of Care Patient      Patient will benefit from skilled therapeutic intervention in order to improve the following deficits and impairments:  Decreased range of motion, Impaired UE functional use, Increased muscle spasms, Decreased activity tolerance, Pain, Improper body mechanics, Impaired flexibility, Decreased scar mobility, Decreased mobility, Decreased strength, Postural dysfunction  Visit Diagnosis: Acute pain of right shoulder  Muscle weakness (generalized)  Stiffness of right shoulder, not elsewhere classified     Problem List Patient Active Problem List   Diagnosis Date Noted  . Mitral valve insufficiency   . Diastolic dysfunction with chronic heart failure ([place]) 02/12/2017  . AC (acromioclavicular) arthritis 01/29/2017  . Spinal stenosis of lumbar region at multiple levels 12/01/2015  . Routine general medical examination at a health care facility 11/08/2015  . Obesity (BMI 30-39.9) 10/28/2012  . BPH (benign prostatic hyperplasia) 06/08/2011  . Gastric ulcer 06/08/2011  . Coronary artery disease due to lipid rich plaque 05/03/2011  . Atrial fibrillation, chronic ([place]) 04/05/2011  . Dyslipidemia, goal LDL below 70 05/25/2009  . BINGE EATING DISORDER 05/25/2009    Jameisha Stofko C. Toni Demo PT, DPT 03/27/17 10:23 AM   [place] [facility] [address] [place], [place], [address] Phone: [phone]   Fax:  [phone]  Name: Blake [name] MRN: [id number] Date of Birth: [birth year]

## 2017-03-28 ENCOUNTER — Encounter: Payer: Self-pay | Admitting: Physical Therapy

## 2017-03-28 ENCOUNTER — Ambulatory Visit: Payer: Medicare Other | Admitting: Physical Therapy

## 2017-03-28 DIAGNOSIS — M25511 Pain in right shoulder: Secondary | ICD-10-CM

## 2017-03-28 DIAGNOSIS — M25611 Stiffness of right shoulder, not elsewhere classified: Secondary | ICD-10-CM | POA: Diagnosis not present

## 2017-03-28 DIAGNOSIS — M6281 Muscle weakness (generalized): Secondary | ICD-10-CM | POA: Diagnosis not present

## 2017-03-28 NOTE — Therapy (Signed)
[place], [place], [address] Phone: [phone]   Fax:  [phone]  Physical Therapy Treatment  Patient Details  Name: Blake [name] MRN: [id number] Date of Birth: 03/15/1946 Referring Provider: Joni Fears  Encounter Date: 03/28/2017      PT End of Session - 03/28/17 1015    Visit Number 9   Number of Visits 25   Date for PT Re-Evaluation 05/03/17   Authorization Type UHC MCR, certified through 3/35,    PT Start Time 1015   PT Stop Time 1055   PT Time Calculation (min) 40 min   Activity Tolerance Patient tolerated treatment well   Behavior During Therapy [facility] for tasks assessed/performed      Past Medical History:  Diagnosis Date  . Angina    none since CABG  . Arthritis   . Bladder spasm    takes Levsin as needed  . CAD (coronary artery disease)   . Diverticulosis   . Duodenal ulcer    hx of  . Dysrhythmia    atrial fib post Cabg/ takes Eliquis daily as well as Metoprolol   . GERD (gastroesophageal reflux disease)    takes Protonix daily  . History of blood transfusion    not abnormal reaction  . History of colon polyps    benign  . History of kidney stones    hx of  . Hyperlipidemia    takes Atorvastatin daily  . HYPERLIPIDEMIA 05/25/2009  . Joint pain   . Nocturia   . PAF (paroxysmal atrial fibrillation) ([place])     Past Surgical History:  Procedure Laterality Date  . CARDIAC CATHETERIZATION     6/12  . COLONOSCOPY    . CORONARY ARTERY BYPASS GRAFT  03/27/2011   x3, Dr Tharon Aquas Trigt    . ESOPHAGOGASTRODUODENOSCOPY ENDOSCOPY  04/12/11   cauterization of bleeding ulcer  and artery in duodenum and stomach   . INGUINAL HERNIA REPAIR  11/06/2011   Procedure: HERNIA REPAIR INGUINAL ADULT;  Surgeon: Earnstine Regal, MD;  Location: [facility];  Service: General;  Laterality: N/A;  Repair left inguinal hernia with mesh  . WISDOM TOOTH EXTRACTION      There were no vitals filed for  this visit.      Subjective Assessment - 03/28/17 1015    Subjective A little stiff this morning but is has since loosened up.                          [facility] Adult PT Treatment/Exercise - 03/28/17 0001      Shoulder Exercises: Supine   Protraction Limitations wand reach at 90 GHJ flx+ protraction at top     Shoulder Exercises: Prone   Retraction Limitations towel under shoulder     Shoulder Exercises: Sidelying   External Rotation 10 reps   External Rotation Limitations towel under elbow   ABduction Other (comment)  8   ABduction Limitations elbow flexed at 90 for short axis, pillow under arm     Shoulder Exercises: Pulleys   Flexion 3 minutes     Shoulder Exercises: ROM/Strengthening   Other ROM/Strengthening Exercises supine wand flexion     Shoulder Exercises: Stretch   Internal Rotation Stretch Limitations with strap behind back     Manual Therapy   Passive ROM GHJ flx, abd, ext rot, int rot                PT  Education - 03/28/17 1016    Education provided Yes   Education Details exercise form/rationale. management of soreness and alterations to HEP as exercises progress   Person(s) Educated Patient   Methods Explanation;Demonstration;Tactile cues;Verbal cues;Handout   Comprehension Verbalized understanding;Returned demonstration;Verbal cues required;Tactile cues required;Need further instruction          PT Short Term Goals - 03/08/17 1056      PT SHORT TERM GOAL #1   Title PROM within 10 deg of L upper extremity by 5/18   Baseline see flowsheet   Status On-going     PT SHORT TERM GOAL #2   Title Pt will be independent with HEP as it has progressed on 5/18   Baseline verbalizes independence and compliance as it has been established at this point, will continue to progress as appropriate   Status Achieved           PT Long Term Goals - 02/06/17 1606      PT LONG TERM GOAL #1   Title Pt will be able to use L UE to reach  overhead and grab dishes from overhead cabinets without increase in pain by 7/13   Baseline unable at eval   Time 12   Period Weeks   Status New     PT LONG TERM GOAL #2   Title Pt will be able to return to light activities of wood working with minimal limitation by shoulder to return to PLOF.    Baseline unable at eval   Time 12   Period Weeks   Status New     PT LONG TERM GOAL #3   Title AROM within 15 deg of R upper extremity to improve functional use of arm   Baseline unable to perform AROM at eval   Time 12   Period Weeks   Status New     PT LONG TERM GOAL #4   Title GHJ MMT grossly 4+/5 to indicate proper support to GHJ by surrounding soft tissue.    Baseline unable to MMT at eval   Time 12   Period Weeks   Status New     PT LONG TERM GOAL #5   Title FOTO to 68% ability to indicate significant improvement in functional ability   Baseline 29% ability at eval   Time 12   Period Weeks   Status New               Plan - 03/28/17 1059    Clinical Impression Statement Pt was sore today following addition of active motion exercises. Educated on continued exercise with soreness but to decrease amount and only do what he can perform correctly. Supine AROm with wand to 137 today.    PT Treatment/Interventions ADLs/Self Care Home Management;Cryotherapy;Electrical Stimulation;Functional mobility training;Traction;Moist Heat;Therapeutic activities;Therapeutic exercise;Neuromuscular re-education;Patient/family education;Passive range of motion;Scar mobilization;Manual techniques;Taping;Dry needling   PT Next Visit Plan continue AROM to tolerance, FOTO   Consulted and Agree with Plan of Care Patient      Patient will benefit from skilled therapeutic intervention in order to improve the following deficits and impairments:  Decreased range of motion, Impaired UE functional use, Increased muscle spasms, Decreased activity tolerance, Pain, Improper body mechanics, Impaired  flexibility, Decreased scar mobility, Decreased mobility, Decreased strength, Postural dysfunction  Visit Diagnosis: Acute pain of right shoulder  Muscle weakness (generalized)  Stiffness of right shoulder, not elsewhere classified     Problem List Patient Active Problem List   Diagnosis Date Noted  . Mitral valve  insufficiency   . Diastolic dysfunction with chronic heart failure ([place]) 02/12/2017  . AC (acromioclavicular) arthritis 01/29/2017  . Spinal stenosis of lumbar region at multiple levels 12/01/2015  . Routine general medical examination at a health care facility 11/08/2015  . Obesity (BMI 30-39.9) 10/28/2012  . BPH (benign prostatic hyperplasia) 06/08/2011  . Gastric ulcer 06/08/2011  . Coronary artery disease due to lipid rich plaque 05/03/2011  . Atrial fibrillation, chronic ([place]) 04/05/2011  . Dyslipidemia, goal LDL below 70 05/25/2009  . BINGE EATING DISORDER 05/25/2009    Lev Cervone C. Charley Miske PT, DPT 03/28/17 12:01 PM   [facility] [address] [place], [place], [address] Phone: [phone]   Fax:  [phone]  Name: MARTELL MCFADYEN MRN: [id number] Date of Birth: 1946-03-11

## 2017-04-02 ENCOUNTER — Encounter: Payer: Self-pay | Admitting: Physical Therapy

## 2017-04-02 ENCOUNTER — Ambulatory Visit: Payer: Medicare Other | Admitting: Physical Therapy

## 2017-04-02 DIAGNOSIS — M6281 Muscle weakness (generalized): Secondary | ICD-10-CM

## 2017-04-02 DIAGNOSIS — M25611 Stiffness of right shoulder, not elsewhere classified: Secondary | ICD-10-CM

## 2017-04-02 DIAGNOSIS — M25511 Pain in right shoulder: Secondary | ICD-10-CM

## 2017-04-02 NOTE — Therapy (Signed)
[place], [place], [address] Phone: [phone]   Fax:  [phone]  Physical Therapy Treatment  Patient Details  Name: Blake [name] MRN: [id number] Date of Birth: February 07, 1946 Referring Provider: Joni Fears  Encounter Date: 04/02/2017      PT End of Session - 04/02/17 0931    Visit Number 10   Number of Visits 25   Date for PT Re-Evaluation 05/03/17   Authorization Type UHC MCR, certified through 5/63,    PT Start Time 0931   PT Stop Time 1016   PT Time Calculation (min) 45 min   Activity Tolerance Patient tolerated treatment well   Behavior During Therapy [facility] for tasks assessed/performed      Past Medical History:  Diagnosis Date  . Angina    none since CABG  . Arthritis   . Bladder spasm    takes Levsin as needed  . CAD (coronary artery disease)   . Diverticulosis   . Duodenal ulcer    hx of  . Dysrhythmia    atrial fib post Cabg/ takes Eliquis daily as well as Metoprolol   . GERD (gastroesophageal reflux disease)    takes Protonix daily  . History of blood transfusion    not abnormal reaction  . History of colon polyps    benign  . History of kidney stones    hx of  . Hyperlipidemia    takes Atorvastatin daily  . HYPERLIPIDEMIA 05/25/2009  . Joint pain   . Nocturia   . PAF (paroxysmal atrial fibrillation) ([place])     Past Surgical History:  Procedure Laterality Date  . CARDIAC CATHETERIZATION     6/12  . COLONOSCOPY    . CORONARY ARTERY BYPASS GRAFT  03/27/2011   x3, Dr Tharon Aquas Trigt    . ESOPHAGOGASTRODUODENOSCOPY ENDOSCOPY  04/12/11   cauterization of bleeding ulcer  and artery in duodenum and stomach   . INGUINAL HERNIA REPAIR  11/06/2011   Procedure: HERNIA REPAIR INGUINAL ADULT;  Surgeon: Earnstine Regal, MD;  Location: [facility];  Service: General;  Laterality: N/A;  Repair left inguinal hernia with mesh  . WISDOM TOOTH EXTRACTION      There were no vitals filed  for this visit.      Subjective Assessment - 04/02/17 0931    Subjective Shoulder is feeling good today   Patient Stated Goals do everything I could before the shoulder pain, woodworking   Currently in Pain? No/denies            [facility] PT Assessment - 04/02/17 0001      Observation/Other Assessments   Focus on Therapeutic Outcomes (FOTO)  48% ability                     [facility] Adult PT Treatment/Exercise - 04/02/17 0001      Shoulder Exercises: Supine   Other Supine Exercises punch from 90 elb flx, AA lowering   Other Supine Exercises supine flexion, wand to end range     Shoulder Exercises: Prone   Retraction Limitations with GHJ ext x10 5s holds, towel under shoulder   External Rotation Limitations arm off side of table at 90 abd   Horizontal ABduction 1 Limitations prone single arm T     Shoulder Exercises: Pulleys   Flexion 3 minutes     Shoulder Exercises: Stretch   Internal Rotation Stretch Limitations with strap behind back   Other Shoulder Stretches flexion with wand  OH, supine     Manual Therapy   Myofascial Release R lats   Passive ROM flx, abd, ER                PT Education - 04/02/17 0935    Education provided Yes   Education Details exercise form/rationale, HEP, repetitions and awareness of movements/muscle activations, progress with ROM   Person(s) Educated Patient   Methods Explanation;Demonstration;Tactile cues;Verbal cues;Handout   Comprehension Verbalized understanding;Returned demonstration;Verbal cues required;Tactile cues required;Need further instruction          PT Short Term Goals - 03/08/17 1056      PT SHORT TERM GOAL #1   Title PROM within 10 deg of L upper extremity by 5/18   Baseline see flowsheet   Status On-going     PT SHORT TERM GOAL #2   Title Pt will be independent with HEP as it has progressed on 5/18   Baseline verbalizes independence and compliance as it has been established at this point, will  continue to progress as appropriate   Status Achieved           PT Long Term Goals - 02/06/17 1606      PT LONG TERM GOAL #1   Title Pt will be able to use L UE to reach overhead and grab dishes from overhead cabinets without increase in pain by 7/13   Baseline unable at eval   Time 12   Period Weeks   Status New     PT LONG TERM GOAL #2   Title Pt will be able to return to light activities of wood working with minimal limitation by shoulder to return to PLOF.    Baseline unable at eval   Time 12   Period Weeks   Status New     PT LONG TERM GOAL #3   Title AROM within 15 deg of R upper extremity to improve functional use of arm   Baseline unable to perform AROM at eval   Time 12   Period Weeks   Status New     PT LONG TERM GOAL #4   Title GHJ MMT grossly 4+/5 to indicate proper support to GHJ by surrounding soft tissue.    Baseline unable to MMT at eval   Time 12   Period Weeks   Status New     PT LONG TERM GOAL #5   Title FOTO to 68% ability to indicate significant improvement in functional ability   Baseline 29% ability at eval   Time 12   Period Weeks   Status New               Plan - 04/02/17 1019    Clinical Impression Statement Progressed to seated AROM flexion with mirror. Pt ROM is progressing well and making functional progress. Limited ROM in flexion due to kyphotic posture in seated, was educated on importance of periscapular strength and alignment for full motion.    PT Treatment/Interventions ADLs/Self Care Home Management;Cryotherapy;Electrical Stimulation;Functional mobility training;Traction;Moist Heat;Therapeutic activities;Therapeutic exercise;Neuromuscular re-education;Patient/family education;Passive range of motion;Scar mobilization;Manual techniques;Taping;Dry needling   PT Next Visit Plan AROM in mirror review, wall slide stretch- check goals & route for MD follow up   PT Home Exercise Plan scapular retraction, putty work, ice, resting  on pillow, isometric IR, ER, flexion, ext , PROM flexion & ER; table slides flexion & scaption; supine protraction, sidelying ER, supine flexion with wand. IR stretch with towel, prone retraction+ext, prone horiz abd, seated AROM flx in  mirror.    Consulted and Agree with Plan of Care Patient      Patient will benefit from skilled therapeutic intervention in order to improve the following deficits and impairments:  Decreased range of motion, Impaired UE functional use, Increased muscle spasms, Decreased activity tolerance, Pain, Improper body mechanics, Impaired flexibility, Decreased scar mobility, Decreased mobility, Decreased strength, Postural dysfunction  Visit Diagnosis: Acute pain of right shoulder  Muscle weakness (generalized)  Stiffness of right shoulder, not elsewhere classified       G-Codes - 12-Apr-2017 1022    Functional Assessment Tool Used (Outpatient Only) FOTO 48% ability (goal 68%), clinical judgement   Carrying, Moving and Handling Objects Current Status (Y3888) At least 40 percent but less than 60 percent impaired, limited or restricted   Carrying, Moving and Handling Objects Goal Status (L5797) At least 20 percent but less than 40 percent impaired, limited or restricted      Problem List Patient Active Problem List   Diagnosis Date Noted  . Mitral valve insufficiency   . Diastolic dysfunction with chronic heart failure ([place]) 02/12/2017  . AC (acromioclavicular) arthritis 01/29/2017  . Spinal stenosis of lumbar region at multiple levels 12/01/2015  . Routine general medical examination at a health care facility 11/08/2015  . Obesity (BMI 30-39.9) 10/28/2012  . BPH (benign prostatic hyperplasia) 06/08/2011  . Gastric ulcer 06/08/2011  . Coronary artery disease due to lipid rich plaque 05/03/2011  . Atrial fibrillation, chronic ([place]) 04/05/2011  . Dyslipidemia, goal LDL below 70 05/25/2009  . BINGE EATING DISORDER 05/25/2009    Jamaiyah Pyle C. Lejuan Botto PT,  DPT 04/12/17 10:23 AM   [facility] [address] [place], [place], [address] Phone: [phone]   Fax:  [phone]  Name: Blake [name] MRN: [id number] Date of Birth: 06-Jul-1946

## 2017-04-03 ENCOUNTER — Encounter: Payer: Self-pay | Admitting: Physical Therapy

## 2017-04-03 ENCOUNTER — Ambulatory Visit: Payer: Medicare Other | Admitting: Physical Therapy

## 2017-04-03 DIAGNOSIS — M25511 Pain in right shoulder: Secondary | ICD-10-CM

## 2017-04-03 DIAGNOSIS — M25611 Stiffness of right shoulder, not elsewhere classified: Secondary | ICD-10-CM

## 2017-04-03 DIAGNOSIS — M6281 Muscle weakness (generalized): Secondary | ICD-10-CM | POA: Diagnosis not present

## 2017-04-03 NOTE — Therapy (Signed)
[place], [place], [address] Phone: [phone]   Fax:  [phone]  Physical Therapy Treatment  Patient Details  Name: Blake [name] MRN: [id number] Date of Birth: 1946/10/04 Referring Provider: Joni Fears  Encounter Date: 04/03/2017      PT End of Session - 04/03/17 0936    Visit Number 11   Number of Visits 25   Date for PT Re-Evaluation 05/03/17   Authorization Type UHC MCR, certified through 7/03,    PT Start Time 0935   PT Stop Time 1014   PT Time Calculation (min) 39 min   Activity Tolerance Patient tolerated treatment well   Behavior During Therapy [facility] for tasks assessed/performed      Past Medical History:  Diagnosis Date  . Angina    none since CABG  . Arthritis   . Bladder spasm    takes Levsin as needed  . CAD (coronary artery disease)   . Diverticulosis   . Duodenal ulcer    hx of  . Dysrhythmia    atrial fib post Cabg/ takes Eliquis daily as well as Metoprolol   . GERD (gastroesophageal reflux disease)    takes Protonix daily  . History of blood transfusion    not abnormal reaction  . History of colon polyps    benign  . History of kidney stones    hx of  . Hyperlipidemia    takes Atorvastatin daily  . HYPERLIPIDEMIA 05/25/2009  . Joint pain   . Nocturia   . PAF (paroxysmal atrial fibrillation) ([place])     Past Surgical History:  Procedure Laterality Date  . CARDIAC CATHETERIZATION     6/12  . COLONOSCOPY    . CORONARY ARTERY BYPASS GRAFT  03/27/2011   x3, Dr Tharon Aquas Trigt    . ESOPHAGOGASTRODUODENOSCOPY ENDOSCOPY  04/12/11   cauterization of bleeding ulcer  and artery in duodenum and stomach   . INGUINAL HERNIA REPAIR  11/06/2011   Procedure: HERNIA REPAIR INGUINAL ADULT;  Surgeon: Earnstine Regal, MD;  Location: [facility];  Service: General;  Laterality: N/A;  Repair left inguinal hernia with mesh  . WISDOM TOOTH EXTRACTION      There were no vitals filed  for this visit.      Subjective Assessment - 04/03/17 0936    Subjective Shoulder is feeling fine. Unable to get palms down in prone GHJ ext exercise   Patient Stated Goals do everything I could before the shoulder pain, woodworking   Currently in Pain? No/denies            [facility] PT Assessment - 04/03/17 0001      ROM / Strength   AROM / PROM / Strength Strength     PROM   Right Shoulder Flexion 141 Degrees  L 151   Right Shoulder ABduction 120 Degrees  L 142   Right Shoulder External Rotation 40 Degrees  L 80  at 90 abd     Strength   Overall Strength Comments gross strength 3-/5                     [facility] Adult PT Treatment/Exercise - 04/03/17 0001      Shoulder Exercises: Supine   External Rotation 15 reps  PT resisted at 90 abd   Internal Rotation 15 reps  PT resisted at 90 abd   Flexion Limitations with wand, table elev 30 deg     Shoulder Exercises: Standing  Extension 20 reps  5s holds   Extension Limitations holding wand palms face back   Other Standing Exercises wall slides     Shoulder Exercises: Pulleys   Flexion 3 minutes     Shoulder Exercises: ROM/Strengthening   Other ROM/Strengthening Exercises finger ladder                PT Education - 04/03/17 1218    Education provided Yes   Education Details exercise form/rationale, HEP   Person(s) Educated Patient   Methods Explanation;Demonstration;Tactile cues;Verbal cues;Handout   Comprehension Verbalized understanding;Returned demonstration;Verbal cues required;Tactile cues required;Need further instruction          PT Short Term Goals - 04/03/17 0951      PT SHORT TERM GOAL #1   Title PROM within 10 deg of L upper extremity by 5/18   Baseline flexion within 10 deg, abd within 20, ER at 90 abd 40 vs 80 on L   Status Partially Met     PT SHORT TERM GOAL #2   Title Pt will be independent with HEP as it has progressed on 5/18   Baseline verbalizes independence and  compliance as it has been established at this point, will continue to progress as appropriate   Status Achieved           PT Long Term Goals - 02/06/17 1606      PT LONG TERM GOAL #1   Title Pt will be able to use L UE to reach overhead and grab dishes from overhead cabinets without increase in pain by 7/13   Baseline unable at eval   Time 12   Period Weeks   Status New     PT LONG TERM GOAL #2   Title Pt will be able to return to light activities of wood working with minimal limitation by shoulder to return to PLOF.    Baseline unable at eval   Time 12   Period Weeks   Status New     PT LONG TERM GOAL #3   Title AROM within 15 deg of R upper extremity to improve functional use of arm   Baseline unable to perform AROM at eval   Time 12   Period Weeks   Status New     PT LONG TERM GOAL #4   Title GHJ MMT grossly 4+/5 to indicate proper support to GHJ by surrounding soft tissue.    Baseline unable to MMT at eval   Time 12   Period Weeks   Status New     PT LONG TERM GOAL #5   Title FOTO to 68% ability to indicate significant improvement in functional ability   Baseline 29% ability at eval   Time 12   Period Weeks   Status New               Plan - 04/03/17 8299    Clinical Impression Statement Pt making good progress in ROM, cont to have difficulty relaxing so a couple of passive movements are required before I am able to stretch him at end range to relax arm. Progressing strengthening as tolerated and heavy cues for upright posture and avoiding shoulder elevation. Will continue to benefit from skilled PT to progress through post op rehab as appropraite. Pt will return in 2 weeks from vacation and was educated on HEP in order to keep making gains over that time.    PT Treatment/Interventions ADLs/Self Care Home Management;Cryotherapy;Electrical Stimulation;Functional mobility training;Traction;Moist Heat;Therapeutic activities;Therapeutic exercise;Neuromuscular  re-education;Patient/family  education;Passive range of motion;Scar mobilization;Manual techniques;Taping;Dry needling   PT Next Visit Plan review HEP and minimize   PT Home Exercise Plan scapular retraction, putty work, ice, resting on pillow, isometric IR, ER, flexion, ext , PROM flexion & ER; table slides flexion & scaption; supine protraction, sidelying ER, supine flexion with wand. IR stretch with towel, prone retraction+ext, prone horiz abd, seated AROM flx in mirror. wall slide, single arm row, ER stretch on table, standing GHJ ext with wand.    Consulted and Agree with Plan of Care Patient      Patient will benefit from skilled therapeutic intervention in order to improve the following deficits and impairments:  Decreased range of motion, Impaired UE functional use, Increased muscle spasms, Decreased activity tolerance, Pain, Improper body mechanics, Impaired flexibility, Decreased scar mobility, Decreased mobility, Decreased strength, Postural dysfunction  Visit Diagnosis: Acute pain of right shoulder  Muscle weakness (generalized)  Stiffness of right shoulder, not elsewhere classified       G-Codes - 04-25-17 1022    Functional Assessment Tool Used (Outpatient Only) FOTO 48% ability (goal 68%), clinical judgement   Carrying, Moving and Handling Objects Current Status (Y0459) At least 40 percent but less than 60 percent impaired, limited or restricted   Carrying, Moving and Handling Objects Goal Status (X7741) At least 20 percent but less than 40 percent impaired, limited or restricted      Problem List Patient Active Problem List   Diagnosis Date Noted  . Mitral valve insufficiency   . Diastolic dysfunction with chronic heart failure ([place]) 02/12/2017  . AC (acromioclavicular) arthritis 01/29/2017  . Spinal stenosis of lumbar region at multiple levels 12/01/2015  . Routine general medical examination at a health care facility 11/08/2015  . Obesity (BMI 30-39.9) 10/28/2012   . BPH (benign prostatic hyperplasia) 06/08/2011  . Gastric ulcer 06/08/2011  . Coronary artery disease due to lipid rich plaque 05/03/2011  . Atrial fibrillation, chronic ([place]) 04/05/2011  . Dyslipidemia, goal LDL below 70 05/25/2009  . BINGE EATING DISORDER 05/25/2009   Allysha Tryon C. Shigeko Manard PT, DPT 04/03/17 12:21 PM   [facility] [address] [place], [place], [address] Phone: [phone]   Fax:  [phone]  Name: Blake [name] MRN: [id number] Date of Birth: 1946/07/27

## 2017-04-05 ENCOUNTER — Ambulatory Visit (INDEPENDENT_AMBULATORY_CARE_PROVIDER_SITE_OTHER): Payer: Medicare Other | Admitting: Orthopaedic Surgery

## 2017-04-05 ENCOUNTER — Encounter (INDEPENDENT_AMBULATORY_CARE_PROVIDER_SITE_OTHER): Payer: Self-pay | Admitting: Orthopaedic Surgery

## 2017-04-05 VITALS — BP 110/73 | HR 98 | Ht 66.0 in | Wt 200.0 lb

## 2017-04-05 DIAGNOSIS — M25511 Pain in right shoulder: Secondary | ICD-10-CM

## 2017-04-05 DIAGNOSIS — G8929 Other chronic pain: Secondary | ICD-10-CM

## 2017-04-05 NOTE — Progress Notes (Signed)
Office Visit Note   Patient: Blake [name]           Date of Birth: 09/08/1946           MRN: [id number] Visit Date: 04/05/2017              Requested by: Blake Lima, MD [address] [place], [place] [address] PCP: Blake Lima, MD   Assessment & Plan: Visit Diagnoses:  1. Chronic right shoulder pain   2-1/2 months status post rotator cuff tear repair of right shoulder with supplemental Dermaspan patch. Continues to go to physical therapy and actually feels like he is making good progress. I believe it'll take at least a year for him to recover and gaining his maximum strength and motion. At this point he's had excellent progress  Plan: Finish up physical therapy with a home exercise program. Office 2 months  Follow-Up Instructions: Return in about 2 months (around 06/05/2017).   Orders:  No orders of the defined types were placed in this encounter.  No orders of the defined types were placed in this encounter.     Procedures: No procedures performed   Clinical Data: No additional findings.   Subjective: Chief Complaint  Patient presents with  . Right Shoulder - Routine Post Op    Blake [name] is a 71 y o status post 9 weeks Right RTC repair. Pt relates he is very happy with ROM and PT agrees.  No related pain. Neurovascular exam is intact distally. Some limitation of active motion area and happy with present progress.  HPI  Review of Systems   Objective: Vital Signs: BP 110/73   Pulse 98   Ht 5\' 6"  (1.676 m)   Wt 200 lb (90.7 kg)   BMI 32.28 kg/m   Physical Exam  Ortho Exam right shoulder exam with well-healed incisions. I could passively place his right arm nearly fully over his head. In that position he can maintain the arm over the head. Actively he has about 90 of abduction and flexion. Some limited external rotation but not a functional loss.  Specialty Comments:  No specialty comments available.  Imaging: No results  found.   PMFS History: Patient Active Problem List   Diagnosis Date Noted  . Mitral valve insufficiency   . Diastolic dysfunction with chronic heart failure ([place]) 02/12/2017  . AC (acromioclavicular) arthritis 01/29/2017  . Spinal stenosis of lumbar region at multiple levels 12/01/2015  . Routine general medical examination at a health care facility 11/08/2015  . Obesity (BMI 30-39.9) 10/28/2012  . BPH (benign prostatic hyperplasia) 06/08/2011  . Gastric ulcer 06/08/2011  . Coronary artery disease due to lipid rich plaque 05/03/2011  . Atrial fibrillation, chronic ([place]) 04/05/2011  . Dyslipidemia, goal LDL below 70 05/25/2009  . BINGE EATING DISORDER 05/25/2009   Past Medical History:  Diagnosis Date  . Angina    none since CABG  . Arthritis   . Bladder spasm    takes Levsin as needed  . CAD (coronary artery disease)   . Diverticulosis   . Duodenal ulcer    hx of  . Dysrhythmia    atrial fib post Cabg/ takes Eliquis daily as well as Metoprolol   . GERD (gastroesophageal reflux disease)    takes Protonix daily  . History of blood transfusion    not abnormal reaction  . History of colon polyps    benign  . History of kidney stones    hx  of  . Hyperlipidemia    takes Atorvastatin daily  . HYPERLIPIDEMIA 05/25/2009  . Joint pain   . Nocturia   . PAF (paroxysmal atrial fibrillation) (HCC)     Family History  Problem Relation Age of Onset  . Coronary artery disease Father   . Heart attack Father 8  . Cancer Father   . Lymphoma Unknown   . Diabetes Sister   . Heart attack Sister 4  . Ovarian cancer Sister   . Atrial fibrillation Brother   . Colon cancer Neg Hx   . Stomach cancer Neg Hx     Past Surgical History:  Procedure Laterality Date  . CARDIAC CATHETERIZATION     6/12  . COLONOSCOPY    . CORONARY ARTERY BYPASS GRAFT  03/27/2011   x3, Dr Tharon Aquas Trigt    . ESOPHAGOGASTRODUODENOSCOPY ENDOSCOPY  04/12/11   cauterization of bleeding ulcer  and artery in  duodenum and stomach   . INGUINAL HERNIA REPAIR  11/06/2011   Procedure: HERNIA REPAIR INGUINAL ADULT;  Surgeon: Earnstine Regal, MD;  Location: [facility];  Service: General;  Laterality: N/A;  Repair left inguinal hernia with mesh  . WISDOM TOOTH EXTRACTION     Social History   Occupational History  . retired     Social History Main Topics  . Smoking status: Former Smoker    Packs/day: 1.00    Years: 25.00    Quit date: 03/04/1988  . Smokeless tobacco: Never Used  . Alcohol use 0.6 oz/week    1 Cans of beer per week     Comment: occ  . Drug use: No  . Sexual activity: Not on file     Blake Balding, MD   Note - This record has been created using [employer].  Chart creation errors have been sought, but may not always  have been located. Such creation errors do not reflect on  the standard of medical care.

## 2017-04-22 ENCOUNTER — Encounter: Payer: Self-pay | Admitting: Physical Therapy

## 2017-04-22 ENCOUNTER — Ambulatory Visit: Payer: Medicare Other | Attending: Orthopaedic Surgery | Admitting: Physical Therapy

## 2017-04-22 ENCOUNTER — Other Ambulatory Visit: Payer: Self-pay | Admitting: Internal Medicine

## 2017-04-22 DIAGNOSIS — M6281 Muscle weakness (generalized): Secondary | ICD-10-CM | POA: Insufficient documentation

## 2017-04-22 DIAGNOSIS — M25511 Pain in right shoulder: Secondary | ICD-10-CM | POA: Insufficient documentation

## 2017-04-22 DIAGNOSIS — M25611 Stiffness of right shoulder, not elsewhere classified: Secondary | ICD-10-CM

## 2017-04-22 NOTE — Therapy (Signed)
[place], [place], [address] Phone: [phone]   Fax:  [phone]  Physical Therapy Treatment  Patient Details  Name: Blake [name] MRN: [id number] Date of Birth: 18-Jul-1946 Referring Provider: Joni Fears, MD  Encounter Date: 04/22/2017      PT End of Session - 04/22/17 1330    Visit Number 12   Number of Visits 25   Date for PT Re-Evaluation 06/21/17   Authorization Type UHC MCR   PT Start Time 1330   PT Stop Time 1413   PT Time Calculation (min) 43 min   Activity Tolerance Patient tolerated treatment well   Behavior During Therapy [facility] for tasks assessed/performed      Past Medical History:  Diagnosis Date  . Angina    none since CABG  . Arthritis   . Bladder spasm    takes Levsin as needed  . CAD (coronary artery disease)   . Diverticulosis   . Duodenal ulcer    hx of  . Dysrhythmia    atrial fib post Cabg/ takes Eliquis daily as well as Metoprolol   . GERD (gastroesophageal reflux disease)    takes Protonix daily  . History of blood transfusion    not abnormal reaction  . History of colon polyps    benign  . History of kidney stones    hx of  . Hyperlipidemia    takes Atorvastatin daily  . HYPERLIPIDEMIA 05/25/2009  . Joint pain   . Nocturia   . PAF (paroxysmal atrial fibrillation) ([place])     Past Surgical History:  Procedure Laterality Date  . CARDIAC CATHETERIZATION     6/12  . COLONOSCOPY    . CORONARY ARTERY BYPASS GRAFT  03/27/2011   x3, Dr Tharon Aquas Trigt    . ESOPHAGOGASTRODUODENOSCOPY ENDOSCOPY  04/12/11   cauterization of bleeding ulcer  and artery in duodenum and stomach   . INGUINAL HERNIA REPAIR  11/06/2011   Procedure: HERNIA REPAIR INGUINAL ADULT;  Surgeon: Earnstine Regal, MD;  Location: [facility];  Service: General;  Laterality: N/A;  Repair left inguinal hernia with mesh  . WISDOM TOOTH EXTRACTION      There were no vitals filed for this visit.       Subjective Assessment - 04/22/17 1330    Subjective Pt reports some soreness overall but pretty good. climbing the wall is the hardest exercise   Patient Stated Goals do everything I could before the shoulder pain, woodworking   Currently in Pain? Yes   Pain Score 1   1/2   Pain Location Shoulder   Pain Orientation Right   Pain Descriptors / Indicators Sore   Aggravating Factors  lifting arm            [facility] PT Assessment - 04/22/17 0001      Assessment   Medical Diagnosis R RCR   Referring Provider Joni Fears, MD   Onset Date/Surgical Date 01/29/17   Hand Dominance Left     ROM / Strength   AROM / PROM / Strength AROM     AROM   AROM Assessment Site Shoulder   Right/Left Shoulder Right   Right Shoulder Flexion 112 Degrees   Right Shoulder ABduction 110 Degrees   Right Shoulder Internal Rotation --  L3   Right Shoulder External Rotation --  behind head     PROM   Overall PROM  Within functional limits for tasks performed  [facility] Adult PT Treatment/Exercise - 04/22/17 0001      Shoulder Exercises: Supine   Flexion Limitations with wand 2# & paired with horiz abd small range   Other Supine Exercises horiz abd yellow tband     Shoulder Exercises: Standing   Flexion Limitations at wall, wand assist above 90   Other Standing Exercises wall slides   Other Standing Exercises flexed at waist flexion 90-120     Shoulder Exercises: ROM/Strengthening   UBE (Upper Arm Bike) 3 min retro L1     Manual Therapy   Soft tissue mobilization IASTM R biceps & anterior deltoid                PT Education - 04/22/17 1413    Education provided Yes   Education Details progress and next steps, exercise form/rationale   Person(s) Educated Patient   Methods Explanation;Demonstration;Tactile cues;Verbal cues;Handout   Comprehension Verbalized understanding;Returned demonstration;Verbal cues required;Tactile cues required;Need further  instruction          PT Short Term Goals - 04/03/17 0951      PT SHORT TERM GOAL #1   Title PROM within 10 deg of L upper extremity by 5/18   Baseline flexion within 10 deg, abd within 20, ER at 90 abd 40 vs 80 on L   Status Partially Met     PT SHORT TERM GOAL #2   Title Pt will be independent with HEP as it has progressed on 5/18   Baseline verbalizes independence and compliance as it has been established at this point, will continue to progress as appropriate   Status Achieved           PT Long Term Goals - 04/22/17 1415      PT LONG TERM GOAL #1   Title Pt will be able to use L UE to reach overhead and grab dishes from overhead cabinets without increase in pain by 7/13   Baseline unable    Status On-going     PT LONG TERM GOAL #2   Title Pt will be able to return to light activities of wood working with minimal limitation by shoulder to return to PLOF.    Baseline unable   Status On-going     PT LONG TERM GOAL #3   Title AROM within 15 deg of R upper extremity to improve functional use of arm   Baseline progressing   Status On-going     PT LONG TERM GOAL #4   Title GHJ MMT grossly 4+/5 to indicate proper support to GHJ by surrounding soft tissue.    Baseline grossly 3-/5   Status On-going     PT LONG TERM GOAL #5   Title FOTO to 68% ability to indicate significant improvement in functional ability   Baseline to be assessed at next visit   Status On-going               Plan - 04/22/17 1408    Clinical Impression Statement Pt demo significant improvement in PROM as well as AROM. Pt requested decrease to 1/week and understands he will have an increase in HEP to do so. Will progress to appropraite AROM movement challenges. Pt says he is ready to be able to "do something" now that he has the ROM.    PT Frequency 1x / week   PT Treatment/Interventions ADLs/Self Care Home Management;Cryotherapy;Electrical Stimulation;Functional mobility  training;Traction;Moist Heat;Therapeutic activities;Therapeutic exercise;Neuromuscular re-education;Patient/family education;Passive range of motion;Scar mobilization;Manual techniques;Taping;Dry needling   PT Next Visit Plan  check HEP+ progress. AROM. FOTO   PT Home Exercise Plan scapular retraction, putty work, ice, resting on pillow, isometric IR, ER, flexion, ext , PROM flexion & ER; table slides flexion & scaption; supine protraction, sidelying ER, supine flexion with wand. IR stretch with towel, prone retraction+ext, prone horiz abd, seated AROM flx in mirror. wall slide, single arm row, ER stretch on table, standing GHJ ext with wand.    Consulted and Agree with Plan of Care Patient      Patient will benefit from skilled therapeutic intervention in order to improve the following deficits and impairments:  Decreased range of motion, Impaired UE functional use, Increased muscle spasms, Decreased activity tolerance, Pain, Improper body mechanics, Impaired flexibility, Decreased scar mobility, Decreased mobility, Decreased strength, Postural dysfunction  Visit Diagnosis: Acute pain of right shoulder - Plan: PT plan of care cert/re-cert  Muscle weakness (generalized) - Plan: PT plan of care cert/re-cert  Stiffness of right shoulder, not elsewhere classified - Plan: PT plan of care cert/re-cert     Problem List Patient Active Problem List   Diagnosis Date Noted  . Mitral valve insufficiency   . Diastolic dysfunction with chronic heart failure ([place]) 02/12/2017  . AC (acromioclavicular) arthritis 01/29/2017  . Spinal stenosis of lumbar region at multiple levels 12/01/2015  . Routine general medical examination at a health care facility 11/08/2015  . Obesity (BMI 30-39.9) 10/28/2012  . BPH (benign prostatic hyperplasia) 06/08/2011  . Gastric ulcer 06/08/2011  . Coronary artery disease due to lipid rich plaque 05/03/2011  . Atrial fibrillation, chronic ([place]) 04/05/2011  . Dyslipidemia,  goal LDL below 70 05/25/2009  . BINGE EATING DISORDER 05/25/2009   Andras Grunewald C. Bethel Gaglio PT, DPT 04/22/17 4:49 PM   [facility] [address] [place], [place], [address] Phone: [phone]   Fax:  [phone]  Name: JAYESH MARBACH MRN: [id number] Date of Birth: June 03, 1946

## 2017-04-22 NOTE — Telephone Encounter (Signed)
Pt was last seen seen [facility] for refill as per office policy (though I accidentally refilled this more than allowed per office policy) Please see [facility] regarding the refills of routine medications if the policy is not clear, thanks

## 2017-04-23 ENCOUNTER — Ambulatory Visit: Payer: Medicare Other | Admitting: Physical Therapy

## 2017-04-30 ENCOUNTER — Ambulatory Visit: Payer: Medicare Other | Admitting: Physical Therapy

## 2017-04-30 ENCOUNTER — Encounter: Payer: Self-pay | Admitting: Physical Therapy

## 2017-04-30 DIAGNOSIS — M6281 Muscle weakness (generalized): Secondary | ICD-10-CM

## 2017-04-30 DIAGNOSIS — M25511 Pain in right shoulder: Secondary | ICD-10-CM | POA: Diagnosis not present

## 2017-04-30 DIAGNOSIS — M25611 Stiffness of right shoulder, not elsewhere classified: Secondary | ICD-10-CM | POA: Diagnosis not present

## 2017-04-30 NOTE — Therapy (Addendum)
[facility] [address] [place], [place], [address] Phone: [phone]   Fax:  [phone]  Physical Therapy Treatment  Patient Details  Name: Blake [name] MRN: [id number] Date of Birth: 12-16-1945 Referring Provider: Norlene Campbell, MD  Encounter Date: 04/30/2017      PT End of Session - 04/30/17 0848    Visit Number 13   Number of Visits 25   Date for PT Re-Evaluation 06/21/17   Authorization Type UHC MCR   PT Start Time 0846   PT Stop Time 0929   PT Time Calculation (min) 43 min   Activity Tolerance Patient tolerated treatment well   Behavior During Therapy [facility] for tasks assessed/performed      Past Medical History:  Diagnosis Date  . Angina    none since CABG  . Arthritis   . Bladder spasm    takes Levsin as needed  . CAD (coronary artery disease)   . Diverticulosis   . Duodenal ulcer    hx of  . Dysrhythmia    atrial fib post Cabg/ takes Eliquis daily as well as Metoprolol   . GERD (gastroesophageal reflux disease)    takes Protonix daily  . History of blood transfusion    not abnormal reaction  . History of colon polyps    benign  . History of kidney stones    hx of  . Hyperlipidemia    takes Atorvastatin daily  . HYPERLIPIDEMIA 05/25/2009  . Joint pain   . Nocturia   . PAF (paroxysmal atrial fibrillation) (HCC)     Past Surgical History:  Procedure Laterality Date  . CARDIAC CATHETERIZATION     6/12  . COLONOSCOPY    . CORONARY ARTERY BYPASS GRAFT  03/27/2011   x3, Dr Kathlee Nations Trigt    . ESOPHAGOGASTRODUODENOSCOPY ENDOSCOPY  04/12/11   cauterization of bleeding ulcer  and artery in duodenum and stomach   . INGUINAL HERNIA REPAIR  11/06/2011   Procedure: HERNIA REPAIR INGUINAL ADULT;  Surgeon: Velora Heckler, MD;  Location: [facility];  Service: General;  Laterality: N/A;  Repair left inguinal hernia with mesh  . WISDOM TOOTH EXTRACTION      There were no vitals filed for this visit.       Subjective Assessment - 04/30/17 0848    Subjective Overall feeling pretty good. Noticing Improvements in funcitonal use and ROM.    Patient Stated Goals do everything I could before the shoulder pain, woodworking   Currently in Pain? No/denies            [facility] PT Assessment - 04/30/17 0001      Observation/Other Assessments   Focus on Therapeutic Outcomes (FOTO)  57% ability                     [facility] Adult PT Treatment/Exercise - 04/30/17 0001      Shoulder Exercises: Supine   Flexion Limitations with wand 2# on R wrist     Shoulder Exercises: Prone   Flexion 20 reps   Extension 20 reps   Horizontal ABduction 1 15 reps   Other Prone Exercises row x20   Other Prone Exercises triceps kick, towel roll under shoulder     Shoulder Exercises: Sidelying   External Rotation Limitations towel under elbow x15   ABduction 15 reps   ABduction Limitations 2# weight to 90     Shoulder Exercises: Standing   External Rotation 20 reps   Theraband Level (Shoulder  External Rotation) Level 2 (Red)   Flexion Limitations yellow tband under foot resist; punch up & straight arm flexion     Shoulder Exercises: ROM/Strengthening   UBE (Upper Arm Bike) 3 min retro L1     Shoulder Exercises: Stretch   External Rotation Stretch 5 reps;10 seconds  trunk flexion with arm rested on table     Manual Therapy   Manual Therapy Joint mobilization   Joint Mobilization PA gr 4 capsular mobs at end range abd, ER, flx                PT Education - 04/30/17 0849    Education provided Yes   Education Details exercise form/rationale, HEP   Person(s) Educated Patient   Methods Explanation;Demonstration;Tactile cues;Verbal cues   Comprehension Verbalized understanding;Returned demonstration;Verbal cues required;Tactile cues required;Need further instruction          PT Short Term Goals - 04/03/17 0951      PT SHORT TERM GOAL #1   Title PROM within 10 deg of L upper  extremity by 5/18   Baseline flexion within 10 deg, abd within 20, ER at 90 abd 40 vs 80 on L   Status Partially Met     PT SHORT TERM GOAL #2   Title Pt will be independent with HEP as it has progressed on 5/18   Baseline verbalizes independence and compliance as it has been established at this point, will continue to progress as appropriate   Status Achieved           PT Long Term Goals - 04/22/17 1415      PT LONG TERM GOAL #1   Title Pt will be able to use L UE to reach overhead and grab dishes from overhead cabinets without increase in pain by 7/13   Baseline unable    Status On-going     PT LONG TERM GOAL #2   Title Pt will be able to return to light activities of wood working with minimal limitation by shoulder to return to PLOF.    Baseline unable   Status On-going     PT LONG TERM GOAL #3   Title AROM within 15 deg of R upper extremity to improve functional use of arm   Baseline progressing   Status On-going     PT LONG TERM GOAL #4   Title GHJ MMT grossly 4+/5 to indicate proper support to GHJ by surrounding soft tissue.    Baseline grossly 3-/5   Status On-going     PT LONG TERM GOAL #5   Title FOTO to 68% ability to indicate significant improvement in functional ability   Baseline to be assessed at next visit   Status On-going               Plan - 04/30/17 1101    Clinical Impression Statement Good motion into flexion but significantly limited in control of arm int neutral due to lack of ER strength. ROM incr with capsular mobilizations. Denies pain with exercises but reports difficulty.    PT Treatment/Interventions ADLs/Self Care Home Management;Cryotherapy;Electrical Stimulation;Functional mobility training;Traction;Moist Heat;Therapeutic activities;Therapeutic exercise;Neuromuscular re-education;Patient/family education;Passive range of motion;Scar mobilization;Manual techniques;Taping;Dry needling   PT Next Visit Plan ROM measures, ext rot control    PT Home Exercise Plan scapular retraction, putty work, ice, resting on pillow, isometric IR, ER, flexion, ext , PROM flexion & ER; table slides flexion & scaption; supine protraction, sidelying ER, supine flexion with wand. IR stretch with towel, prone retraction+ext, prone  horiz abd, seated AROM flx in mirror. wall slide, single arm row, ER stretch on table, standing GHJ ext with wand. ER stretch, ER red tband, resisted flexion yellow   Consulted and Agree with Plan of Care Patient      Patient will benefit from skilled therapeutic intervention in order to improve the following deficits and impairments:  Decreased range of motion, Impaired UE functional use, Increased muscle spasms, Decreased activity tolerance, Pain, Improper body mechanics, Impaired flexibility, Decreased scar mobility, Decreased mobility, Decreased strength, Postural dysfunction  Visit Diagnosis: Acute pain of right shoulder  Muscle weakness (generalized)  Stiffness of right shoulder, not elsewhere classified       G-Codes - 22-May-2017 1103    Functional Assessment Tool Used (Outpatient Only) FOTO 57% ability (goal 68%), clinical judgement   Functional Limitation Carrying, moving and handling objects   Carrying, Moving and Handling Objects Current Status (X4801) At least 40 percent but less than 60 percent impaired, limited or restricted   Carrying, Moving and Handling Objects Goal Status (K5537) At least 20 percent but less than 40 percent impaired, limited or restricted      Problem List Patient Active Problem List   Diagnosis Date Noted  . Mitral valve insufficiency   . Diastolic dysfunction with chronic heart failure ([place]) 02/12/2017  . AC (acromioclavicular) arthritis 01/29/2017  . Spinal stenosis of lumbar region at multiple levels 12/01/2015  . Routine general medical examination at a health care facility 11/08/2015  . Obesity (BMI 30-39.9) 10/28/2012  . BPH (benign prostatic hyperplasia) 06/08/2011  .  Gastric ulcer 06/08/2011  . Coronary artery disease due to lipid rich plaque 05/03/2011  . Atrial fibrillation, chronic ([place]) 04/05/2011  . Dyslipidemia, goal LDL below 70 05/25/2009  . BINGE EATING DISORDER 05/25/2009   Macenzie Burford C. Avis Mcmahill PT, DPT May 22, 2017 11:44 AM   [facility] [address] [place], [place], [address] Phone: [phone]   Fax:  [phone]  Name: Blake [name] MRN: [id number] Date of Birth: [birth year]

## 2017-05-06 ENCOUNTER — Ambulatory Visit: Payer: Medicare Other | Admitting: Physical Therapy

## 2017-05-06 ENCOUNTER — Encounter: Payer: Self-pay | Admitting: Physical Therapy

## 2017-05-06 DIAGNOSIS — M25611 Stiffness of right shoulder, not elsewhere classified: Secondary | ICD-10-CM

## 2017-05-06 DIAGNOSIS — M25511 Pain in right shoulder: Secondary | ICD-10-CM | POA: Diagnosis not present

## 2017-05-06 DIAGNOSIS — M6281 Muscle weakness (generalized): Secondary | ICD-10-CM

## 2017-05-06 NOTE — Therapy (Signed)
[place], [place], [address] Phone: [phone]   Fax:  [phone]  Physical Therapy Treatment  Patient Details  Name: Blake [name] MRN: [id number] Date of Birth: 1946/01/10 Referring Provider: Joni Fears, MD  Encounter Date: 05/06/2017      PT End of Session - 05/06/17 1503    Visit Number 14   Number of Visits 25   Date for PT Re-Evaluation 06/21/17   Authorization Type UHC MCR   PT Start Time 1503   PT Stop Time 1545   PT Time Calculation (min) 42 min   Activity Tolerance Patient tolerated treatment well   Behavior During Therapy [facility] for tasks assessed/performed      Past Medical History:  Diagnosis Date  . Angina    none since CABG  . Arthritis   . Bladder spasm    takes Levsin as needed  . CAD (coronary artery disease)   . Diverticulosis   . Duodenal ulcer    hx of  . Dysrhythmia    atrial fib post Cabg/ takes Eliquis daily as well as Metoprolol   . GERD (gastroesophageal reflux disease)    takes Protonix daily  . History of blood transfusion    not abnormal reaction  . History of colon polyps    benign  . History of kidney stones    hx of  . Hyperlipidemia    takes Atorvastatin daily  . HYPERLIPIDEMIA 05/25/2009  . Joint pain   . Nocturia   . PAF (paroxysmal atrial fibrillation) ([place])     Past Surgical History:  Procedure Laterality Date  . CARDIAC CATHETERIZATION     6/12  . COLONOSCOPY    . CORONARY ARTERY BYPASS GRAFT  03/27/2011   x3, Dr Tharon Aquas Trigt    . ESOPHAGOGASTRODUODENOSCOPY ENDOSCOPY  04/12/11   cauterization of bleeding ulcer  and artery in duodenum and stomach   . INGUINAL HERNIA REPAIR  11/06/2011   Procedure: HERNIA REPAIR INGUINAL ADULT;  Surgeon: Earnstine Regal, MD;  Location: [facility];  Service: General;  Laterality: N/A;  Repair left inguinal hernia with mesh  . WISDOM TOOTH EXTRACTION      There were no vitals filed for this visit.       Subjective Assessment - 05/06/17 1503    Subjective Was surprised in change of exercises but did well with new HEP. Most discomfort noted in eccentric lower of sidelying shoulder abduction.    Patient Stated Goals do everything I could before the shoulder pain, woodworking   Currently in Pain? No/denies            [facility] PT Assessment - 05/06/17 0001      AROM   Right Shoulder Flexion 133 Degrees   Right Shoulder ABduction 140 Degrees   Right Shoulder External Rotation 30 Degrees  from elbows at side     PROM   Right Shoulder Flexion 154 Degrees   Right Shoulder ABduction 146 Degrees   Right Shoulder External Rotation 51 Degrees                     [facility] Adult PT Treatment/Exercise - 05/06/17 0001      Shoulder Exercises: Supine   Flexion 15 reps   Shoulder Flexion Weight (lbs) 1lb     Shoulder Exercises: Seated   Flexion Limitations lift from arms rested on bolster (beg at 90 deg flx)     Shoulder Exercises: Sidelying   ABduction 15  reps   ABduction Limitations cues for ext rot     Shoulder Exercises: Standing   Flexion Limitations 1# reach to lower cabinet'     Shoulder Exercises: ROM/Strengthening   UBE (Upper Arm Bike) 2'/2' L1   "W" Arms bilat, use of mirror   X to V Arms bilat, use of mirror     Manual Therapy   Soft tissue mobilization IASTM R brachioradialis & wrist extensors   Passive ROM GHJ all directions                PT Education - 05/06/17 1559    Education provided Yes   Education Details exercise form/rationale, progress toward goals, POC & long term    Person(s) Educated Patient   Methods Explanation;Demonstration;Tactile cues;Verbal cues   Comprehension Verbalized understanding;Returned demonstration;Verbal cues required;Tactile cues required;Need further instruction          PT Short Term Goals - 04/03/17 0951      PT SHORT TERM GOAL #1   Title PROM within 10 deg of L upper extremity by 5/18   Baseline flexion  within 10 deg, abd within 20, ER at 90 abd 40 vs 80 on L   Status Partially Met     PT SHORT TERM GOAL #2   Title Pt will be independent with HEP as it has progressed on 5/18   Baseline verbalizes independence and compliance as it has been established at this point, will continue to progress as appropriate   Status Achieved           PT Long Term Goals - 04/22/17 1415      PT LONG TERM GOAL #1   Title Pt will be able to use L UE to reach overhead and grab dishes from overhead cabinets without increase in pain by 7/13   Baseline unable    Status On-going     PT LONG TERM GOAL #2   Title Pt will be able to return to light activities of wood working with minimal limitation by shoulder to return to PLOF.    Baseline unable   Status On-going     PT LONG TERM GOAL #3   Title AROM within 15 deg of R upper extremity to improve functional use of arm   Baseline progressing   Status On-going     PT LONG TERM GOAL #4   Title GHJ MMT grossly 4+/5 to indicate proper support to GHJ by surrounding soft tissue.    Baseline grossly 3-/5   Status On-going     PT LONG TERM GOAL #5   Title FOTO to 68% ability to indicate significant improvement in functional ability   Baseline to be assessed at next visit   Status On-going               Plan - 05/06/17 1551    Clinical Impression Statement Pt demo significant improvement in GHJ PROM and AROM but lacks endurance for functional use. I asked pt to continue HEP but to begin using the arm in functional activities as well as be cognisant of movement patterns to avoid creating poor habits.    PT Treatment/Interventions ADLs/Self Care Home Management;Cryotherapy;Electrical Stimulation;Functional mobility training;Traction;Moist Heat;Therapeutic activities;Therapeutic exercise;Neuromuscular re-education;Patient/family education;Passive range of motion;Scar mobilization;Manual techniques;Taping;Dry needling   PT Next Visit Plan endurance   PT  Home Exercise Plan scapular retraction, putty work, ice, resting on pillow, isometric IR, ER, flexion, ext , PROM flexion & ER; table slides flexion & scaption; supine protraction, sidelying ER, supine  flexion with wand. IR stretch with towel, prone retraction+ext, prone horiz abd, seated AROM flx in mirror. wall slide, single arm row, ER stretch on table, standing GHJ ext with wand. ER stretch, ER red tband, resisted flexion yellow   Consulted and Agree with Plan of Care Patient      Patient will benefit from skilled therapeutic intervention in order to improve the following deficits and impairments:  Decreased range of motion, Impaired UE functional use, Increased muscle spasms, Decreased activity tolerance, Pain, Improper body mechanics, Impaired flexibility, Decreased scar mobility, Decreased mobility, Decreased strength, Postural dysfunction  Visit Diagnosis: Acute pain of right shoulder  Muscle weakness (generalized)  Stiffness of right shoulder, not elsewhere classified     Problem List Patient Active Problem List   Diagnosis Date Noted  . Mitral valve insufficiency   . Diastolic dysfunction with chronic heart failure ([place]) 02/12/2017  . AC (acromioclavicular) arthritis 01/29/2017  . Spinal stenosis of lumbar region at multiple levels 12/01/2015  . Routine general medical examination at a health care facility 11/08/2015  . Obesity (BMI 30-39.9) 10/28/2012  . BPH (benign prostatic hyperplasia) 06/08/2011  . Gastric ulcer 06/08/2011  . Coronary artery disease due to lipid rich plaque 05/03/2011  . Atrial fibrillation, chronic ([place]) 04/05/2011  . Dyslipidemia, goal LDL below 70 05/25/2009  . BINGE EATING DISORDER 05/25/2009    Kavina Cantave C. Emmalena Canny PT, DPT 05/06/17 3:59 PM   [facility] [address] [place], [place], [address] Phone: [phone]   Fax:  [phone]  Name: Blake [name] MRN: [id number] Date of Birth:  1946-03-27

## 2017-05-15 ENCOUNTER — Ambulatory Visit: Payer: Medicare Other | Admitting: Physical Therapy

## 2017-05-15 ENCOUNTER — Encounter: Payer: Self-pay | Admitting: Physical Therapy

## 2017-05-15 DIAGNOSIS — M25511 Pain in right shoulder: Secondary | ICD-10-CM | POA: Diagnosis not present

## 2017-05-15 DIAGNOSIS — M6281 Muscle weakness (generalized): Secondary | ICD-10-CM | POA: Diagnosis not present

## 2017-05-15 DIAGNOSIS — M25611 Stiffness of right shoulder, not elsewhere classified: Secondary | ICD-10-CM

## 2017-05-15 NOTE — Therapy (Signed)
[place], [place], [address] Phone: [phone]   Fax:  [phone]  Physical Therapy Treatment  Patient Details  Name: Blake [name] MRN: [id number] Date of Birth: 01-May-1946 Referring Provider: Joni Fears, MD  Encounter Date: 05/15/2017      PT End of Session - 05/15/17 0929    Visit Number 15   Number of Visits 25   Date for PT Re-Evaluation 06/21/17   Authorization Type UHC MCR   PT Start Time 0929   PT Stop Time 1010   PT Time Calculation (min) 41 min   Activity Tolerance Patient tolerated treatment well   Behavior During Therapy [facility] for tasks assessed/performed      Past Medical History:  Diagnosis Date  . Angina    none since CABG  . Arthritis   . Bladder spasm    takes Levsin as needed  . CAD (coronary artery disease)   . Diverticulosis   . Duodenal ulcer    hx of  . Dysrhythmia    atrial fib post Cabg/ takes Eliquis daily as well as Metoprolol   . GERD (gastroesophageal reflux disease)    takes Protonix daily  . History of blood transfusion    not abnormal reaction  . History of colon polyps    benign  . History of kidney stones    hx of  . Hyperlipidemia    takes Atorvastatin daily  . HYPERLIPIDEMIA 05/25/2009  . Joint pain   . Nocturia   . PAF (paroxysmal atrial fibrillation) ([place])     Past Surgical History:  Procedure Laterality Date  . CARDIAC CATHETERIZATION     6/12  . COLONOSCOPY    . CORONARY ARTERY BYPASS GRAFT  03/27/2011   x3, Dr Tharon Aquas Trigt    . ESOPHAGOGASTRODUODENOSCOPY ENDOSCOPY  04/12/11   cauterization of bleeding ulcer  and artery in duodenum and stomach   . INGUINAL HERNIA REPAIR  11/06/2011   Procedure: HERNIA REPAIR INGUINAL ADULT;  Surgeon: Earnstine Regal, MD;  Location: [facility];  Service: General;  Laterality: N/A;  Repair left inguinal hernia with mesh  . WISDOM TOOTH EXTRACTION      There were no vitals filed for this visit.       Subjective Assessment - 05/15/17 0929    Subjective Doing HEP, some challenge. Denies any pain today. Trying to use arm in daily activities.    Patient Stated Goals do everything I could before the shoulder pain, woodworking   Currently in Pain? No/denies   Aggravating Factors  reaching behind                         [facility] Adult PT Treatment/Exercise - 05/15/17 0001      Shoulder Exercises: Seated   Flexion Limitations arm rested on bolster, alt UE flexion     Shoulder Exercises: Standing   External Rotation 20 reps;10 reps   Theraband Level (Shoulder External Rotation) Level 2 (Red)  towel under elbow   Internal Rotation 20 reps;10 reps   Theraband Level (Shoulder Internal Rotation) Level 2 (Red)   Internal Rotation Limitations towel under elbow   Flexion Limitations lift off from wall   Extension 20 reps   Theraband Level (Shoulder Extension) Level 2 (Red)   Other Standing Exercises x20 ext, iso press out on strap, palms back   Other Standing Exercises yellow plyo ball roll up incline table     Shoulder Exercises:  ROM/Strengthening   UBE (Upper Arm Bike) 3'/3' L1   "W" Arms bilat, use of mirror- 1# each hand   X to V Arms bilat, use of mirror- 1# each hand   Other ROM/Strengthening Exercises body blade A/P & lat 2x1 min each     Shoulder Exercises: Stretch   Internal Rotation Stretch 10 seconds   Table Stretch - Flexion 5 reps;10 seconds  at counter                PT Education - 05/15/17 1011    Education provided Yes   Education Details exercise form/rationale, HEP, endurance & progression   Person(s) Educated Patient   Methods Explanation;Demonstration;Tactile cues;Verbal cues;Handout   Comprehension Verbalized understanding;Returned demonstration;Verbal cues required;Tactile cues required;Need further instruction          PT Short Term Goals - 04/03/17 0951      PT SHORT TERM GOAL #1   Title PROM within 10 deg of L upper extremity by  5/18   Baseline flexion within 10 deg, abd within 20, ER at 90 abd 40 vs 80 on L   Status Partially Met     PT SHORT TERM GOAL #2   Title Pt will be independent with HEP as it has progressed on 5/18   Baseline verbalizes independence and compliance as it has been established at this point, will continue to progress as appropriate   Status Achieved           PT Long Term Goals - 04/22/17 1415      PT LONG TERM GOAL #1   Title Pt will be able to use L UE to reach overhead and grab dishes from overhead cabinets without increase in pain by 7/13   Baseline unable    Status On-going     PT LONG TERM GOAL #2   Title Pt will be able to return to light activities of wood working with minimal limitation by shoulder to return to PLOF.    Baseline unable   Status On-going     PT LONG TERM GOAL #3   Title AROM within 15 deg of R upper extremity to improve functional use of arm   Baseline progressing   Status On-going     PT LONG TERM GOAL #4   Title GHJ MMT grossly 4+/5 to indicate proper support to GHJ by surrounding soft tissue.    Baseline grossly 3-/5   Status On-going     PT LONG TERM GOAL #5   Title FOTO to 68% ability to indicate significant improvement in functional ability   Baseline to be assessed at next visit   Status On-going               Plan - 05/15/17 1011    Clinical Impression Statement Increased endurance challenges which was significantly difficult but pt is able to perform with good form. Improved awareness of movements. Pt would like to continue at 1/week.    PT Treatment/Interventions ADLs/Self Care Home Management;Cryotherapy;Electrical Stimulation;Functional mobility training;Traction;Moist Heat;Therapeutic activities;Therapeutic exercise;Neuromuscular re-education;Patient/family education;Passive range of motion;Scar mobilization;Manual techniques;Taping;Dry needling   PT Next Visit Plan endurance   PT Home Exercise Plan scapular retraction, putty  work, ice, resting on pillow, isometric IR, ER, flexion, ext , PROM flexion & ER; table slides flexion & scaption; supine protraction, sidelying ER, supine flexion with wand. IR stretch with towel, prone retraction+ext, prone horiz abd, seated AROM flx in mirror. wall slide, single arm row, ER stretch on table, standing GHJ ext with  wand. ER stretch, ER red tband, resisted flexion yellow, wall lift off   Consulted and Agree with Plan of Care Patient      Patient will benefit from skilled therapeutic intervention in order to improve the following deficits and impairments:  Decreased range of motion, Impaired UE functional use, Increased muscle spasms, Decreased activity tolerance, Pain, Improper body mechanics, Impaired flexibility, Decreased scar mobility, Decreased mobility, Decreased strength, Postural dysfunction  Visit Diagnosis: Acute pain of right shoulder  Muscle weakness (generalized)  Stiffness of right shoulder, not elsewhere classified     Problem List Patient Active Problem List   Diagnosis Date Noted  . Mitral valve insufficiency   . Diastolic dysfunction with chronic heart failure ([place]) 02/12/2017  . AC (acromioclavicular) arthritis 01/29/2017  . Spinal stenosis of lumbar region at multiple levels 12/01/2015  . Routine general medical examination at a health care facility 11/08/2015  . Obesity (BMI 30-39.9) 10/28/2012  . BPH (benign prostatic hyperplasia) 06/08/2011  . Gastric ulcer 06/08/2011  . Coronary artery disease due to lipid rich plaque 05/03/2011  . Atrial fibrillation, chronic ([place]) 04/05/2011  . Dyslipidemia, goal LDL below 70 05/25/2009  . BINGE EATING DISORDER 05/25/2009    Lorcan Shelp C. Jewelene Mairena PT, DPT 05/15/17 10:14 AM   [place] [facility] [address] [place], [place], [address] Phone: [phone]   Fax:  [phone]  Name: Blake [name] MRN: [id number] Date of Birth: 09/21/1946

## 2017-05-21 ENCOUNTER — Ambulatory Visit: Payer: Medicare Other | Admitting: Physical Therapy

## 2017-05-21 ENCOUNTER — Encounter: Payer: Self-pay | Admitting: Physical Therapy

## 2017-05-21 DIAGNOSIS — M25511 Pain in right shoulder: Secondary | ICD-10-CM | POA: Diagnosis not present

## 2017-05-21 DIAGNOSIS — M25611 Stiffness of right shoulder, not elsewhere classified: Secondary | ICD-10-CM | POA: Diagnosis not present

## 2017-05-21 DIAGNOSIS — M6281 Muscle weakness (generalized): Secondary | ICD-10-CM

## 2017-05-21 NOTE — Therapy (Signed)
[place], [place], [address] Phone: [phone]   Fax:  [phone]  Physical Therapy Treatment  Patient Details  Name: Blake [name] MRN: [id number] Date of Birth: 1946-05-13 Referring Provider: Joni Fears, MD  Encounter Date: 05/21/2017      PT End of Session - 05/21/17 1019    Visit Number 16   Number of Visits 25   Date for PT Re-Evaluation 06/21/17   Authorization Type UHC MCR   PT Start Time 1019   PT Stop Time 1057   PT Time Calculation (min) 38 min   Activity Tolerance Patient tolerated treatment well   Behavior During Therapy [facility] for tasks assessed/performed      Past Medical History:  Diagnosis Date  . Angina    none since CABG  . Arthritis   . Bladder spasm    takes Levsin as needed  . CAD (coronary artery disease)   . Diverticulosis   . Duodenal ulcer    hx of  . Dysrhythmia    atrial fib post Cabg/ takes Eliquis daily as well as Metoprolol   . GERD (gastroesophageal reflux disease)    takes Protonix daily  . History of blood transfusion    not abnormal reaction  . History of colon polyps    benign  . History of kidney stones    hx of  . Hyperlipidemia    takes Atorvastatin daily  . HYPERLIPIDEMIA 05/25/2009  . Joint pain   . Nocturia   . PAF (paroxysmal atrial fibrillation) ([place])     Past Surgical History:  Procedure Laterality Date  . CARDIAC CATHETERIZATION     6/12  . COLONOSCOPY    . CORONARY ARTERY BYPASS GRAFT  03/27/2011   x3, Dr Tharon Aquas Trigt    . ESOPHAGOGASTRODUODENOSCOPY ENDOSCOPY  04/12/11   cauterization of bleeding ulcer  and artery in duodenum and stomach   . INGUINAL HERNIA REPAIR  11/06/2011   Procedure: HERNIA REPAIR INGUINAL ADULT;  Surgeon: Earnstine Regal, MD;  Location: [facility];  Service: General;  Laterality: N/A;  Repair left inguinal hernia with mesh  . WISDOM TOOTH EXTRACTION      There were no vitals filed for this visit.       Subjective Assessment - 05/21/17 1019    Subjective Has been trying to use the arm more without increase in pain.    Patient Stated Goals do everything I could before the shoulder pain, woodworking   Currently in Pain? No/denies                         [facility] Adult PT Treatment/Exercise - 05/21/17 0001      Shoulder Exercises: Seated   External Rotation Limitations palms up & in, 1#     Shoulder Exercises: Standing   Flexion Limitations lift off; back to wall press into band   Row Limitations green band palm fwd & back   Other Standing Exercises triceps kick   Other Standing Exercises wall clocks & wall ER yellow tband     Shoulder Exercises: ROM/Strengthening   UBE (Upper Arm Bike) 2'/2' L2   "W" Arms seated, 1#   X to V Arms seated, 1#                PT Education - 05/21/17 1058    Education provided Yes   Education Details exercise form/rationale, appropraite progression of exercises   Person(s) Educated Patient  Methods Explanation;Demonstration;Tactile cues;Verbal cues;Handout   Comprehension Verbalized understanding;Returned demonstration;Verbal cues required;Tactile cues required;Need further instruction          PT Short Term Goals - 04/03/17 0951      PT SHORT TERM GOAL #1   Title PROM within 10 deg of L upper extremity by 5/18   Baseline flexion within 10 deg, abd within 20, ER at 90 abd 40 vs 80 on L   Status Partially Met     PT SHORT TERM GOAL #2   Title Pt will be independent with HEP as it has progressed on 5/18   Baseline verbalizes independence and compliance as it has been established at this point, will continue to progress as appropriate   Status Achieved           PT Long Term Goals - 04/22/17 1415      PT LONG TERM GOAL #1   Title Pt will be able to use L UE to reach overhead and grab dishes from overhead cabinets without increase in pain by 7/13   Baseline unable    Status On-going     PT LONG TERM GOAL #2    Title Pt will be able to return to light activities of wood working with minimal limitation by shoulder to return to PLOF.    Baseline unable   Status On-going     PT LONG TERM GOAL #3   Title AROM within 15 deg of R upper extremity to improve functional use of arm   Baseline progressing   Status On-going     PT LONG TERM GOAL #4   Title GHJ MMT grossly 4+/5 to indicate proper support to GHJ by surrounding soft tissue.    Baseline grossly 3-/5   Status On-going     PT LONG TERM GOAL #5   Title FOTO to 68% ability to indicate significant improvement in functional ability   Baseline to be assessed at next visit   Status On-going               Plan - 05/21/17 1058    Clinical Impression Statement Notable limitation in active external rotation strength. Improved quality of movement into flexion and abduction with decreased shoulder elevation. will continue to challenge endurance and functional strength of shoulder.    PT Treatment/Interventions ADLs/Self Care Home Management;Cryotherapy;Electrical Stimulation;Functional mobility training;Traction;Moist Heat;Therapeutic activities;Therapeutic exercise;Neuromuscular re-education;Patient/family education;Passive range of motion;Scar mobilization;Manual techniques;Taping;Dry needling   PT Next Visit Plan endurance   PT Home Exercise Plan scapular retraction, putty work, ice, resting on pillow, isometric IR, ER, flexion, ext , PROM flexion & ER; table slides flexion & scaption; supine protraction, sidelying ER, supine flexion with wand. IR stretch with towel, prone retraction+ext, prone horiz abd, seated AROM flx in mirror. wall slide, single arm row, ER stretch on table, standing GHJ ext with wand. ER stretch, ER red tband, resisted flexion yellow, wall lift off   Consulted and Agree with Plan of Care Patient      Patient will benefit from skilled therapeutic intervention in order to improve the following deficits and impairments:   Decreased range of motion, Impaired UE functional use, Increased muscle spasms, Decreased activity tolerance, Pain, Improper body mechanics, Impaired flexibility, Decreased scar mobility, Decreased mobility, Decreased strength, Postural dysfunction  Visit Diagnosis: Acute pain of right shoulder  Muscle weakness (generalized)  Stiffness of right shoulder, not elsewhere classified     Problem List Patient Active Problem List   Diagnosis Date Noted  . Mitral valve insufficiency   .  Diastolic dysfunction with chronic heart failure ([place]) 02/12/2017  . AC (acromioclavicular) arthritis 01/29/2017  . Spinal stenosis of lumbar region at multiple levels 12/01/2015  . Routine general medical examination at a health care facility 11/08/2015  . Obesity (BMI 30-39.9) 10/28/2012  . BPH (benign prostatic hyperplasia) 06/08/2011  . Gastric ulcer 06/08/2011  . Coronary artery disease due to lipid rich plaque 05/03/2011  . Atrial fibrillation, chronic ([place]) 04/05/2011  . Dyslipidemia, goal LDL below 70 05/25/2009  . BINGE EATING DISORDER 05/25/2009    Nelida Mandarino C. Rashawn Rolon PT, DPT 05/21/17 11:00 AM   [place], [place], [address] Phone: [phone]   Fax:  [phone]  Name: Blake [name] MRN: [id number] Date of Birth: 1945/12/04

## 2017-05-30 ENCOUNTER — Ambulatory Visit: Payer: Medicare Other | Attending: Orthopaedic Surgery | Admitting: Physical Therapy

## 2017-05-30 ENCOUNTER — Encounter (INDEPENDENT_AMBULATORY_CARE_PROVIDER_SITE_OTHER): Payer: Self-pay | Admitting: Orthopaedic Surgery

## 2017-05-30 ENCOUNTER — Encounter: Payer: Self-pay | Admitting: Physical Therapy

## 2017-05-30 ENCOUNTER — Ambulatory Visit (INDEPENDENT_AMBULATORY_CARE_PROVIDER_SITE_OTHER): Payer: Medicare Other | Admitting: Orthopaedic Surgery

## 2017-05-30 VITALS — BP 94/58 | HR 84 | Resp 14 | Ht 66.0 in | Wt 185.0 lb

## 2017-05-30 DIAGNOSIS — G8929 Other chronic pain: Secondary | ICD-10-CM

## 2017-05-30 DIAGNOSIS — M25511 Pain in right shoulder: Secondary | ICD-10-CM

## 2017-05-30 DIAGNOSIS — M25611 Stiffness of right shoulder, not elsewhere classified: Secondary | ICD-10-CM | POA: Diagnosis not present

## 2017-05-30 DIAGNOSIS — M6281 Muscle weakness (generalized): Secondary | ICD-10-CM

## 2017-05-30 NOTE — Progress Notes (Signed)
Office Visit Note   Patient: Blake [name]           Date of Birth: [birth year]           MRN: [id number] Visit Date: 05/30/2017              Requested by: Janith Lima, MD [address] [place], [place] [address] PCP: Janith Lima, MD   Assessment & Plan: Visit Diagnoses:  1. Chronic right shoulder pain   4 months status post rotator cuff tear repair right shoulder requiring Dermaspan patch  Plan: Blake [name] is doing very well and happy with his present course. He is not having any pain and is able to raise his arm over his head. He had a massive rotator cuff tear that was incompletely repaired. The dermaspan patch was applied.  Follow-Up Instructions: Return if symptoms worsen or fail to improve.   Orders:  No orders of the defined types were placed in this encounter.  No orders of the defined types were placed in this encounter.     Procedures: No procedures performed   Clinical Data: No additional findings.   Subjective: Chief Complaint  Patient presents with  . Right Shoulder - Routine Post Op     Blake [name] is 4 months status post Right shoulder RCT.Pt is still in PT working on building strength and ROM at 100%    HPI  Review of Systems  All other systems reviewed and are negative.    Objective: Vital Signs: BP (!) 94/58   Pulse 84   Resp 14   Ht 5\' 6"  (1.676 m)   Wt 185 lb (83.9 kg)   BMI 29.86 kg/m   Physical Exam  Ortho Exam right shoulder not hot red or ecchymotic. No pain with motion. Able to place his arm fully over his head. Some weakness with external rotation. Motion overhead is somewhat circuitous but related to his weakness and not pain.  Specialty Comments:  No specialty comments available.  Imaging: No results found.   PMFS History: Patient Active Problem List   Diagnosis Date Noted  . Mitral valve insufficiency   . Diastolic dysfunction with chronic heart failure ([place]) 02/12/2017  . AC  (acromioclavicular) arthritis 01/29/2017  . Spinal stenosis of lumbar region at multiple levels 12/01/2015  . Routine general medical examination at a health care facility 11/08/2015  . Obesity (BMI 30-39.9) 10/28/2012  . BPH (benign prostatic hyperplasia) 06/08/2011  . Gastric ulcer 06/08/2011  . Coronary artery disease due to lipid rich plaque 05/03/2011  . Atrial fibrillation, chronic ([place]) 04/05/2011  . Dyslipidemia, goal LDL below 70 05/25/2009  . BINGE EATING DISORDER 05/25/2009   Past Medical History:  Diagnosis Date  . Angina    none since CABG  . Arthritis   . Bladder spasm    takes Levsin as needed  . CAD (coronary artery disease)   . Diverticulosis   . Duodenal ulcer    hx of  . Dysrhythmia    atrial fib post Cabg/ takes Eliquis daily as well as Metoprolol   . GERD (gastroesophageal reflux disease)    takes Protonix daily  . History of blood transfusion    not abnormal reaction  . History of colon polyps    benign  . History of kidney stones    hx of  . Hyperlipidemia    takes Atorvastatin daily  . HYPERLIPIDEMIA 05/25/2009  . Joint pain   . Nocturia   .  PAF (paroxysmal atrial fibrillation) ([facility])     Family History  Problem Relation Age of Onset  . Coronary artery disease Father   . Heart attack Father 62  . Cancer Father   . Lymphoma Unknown   . Diabetes Sister   . Heart attack Sister 105  . Ovarian cancer Sister   . Atrial fibrillation Brother   . Colon cancer Neg Hx   . Stomach cancer Neg Hx     Past Surgical History:  Procedure Laterality Date  . CARDIAC CATHETERIZATION     6/12  . COLONOSCOPY    . CORONARY ARTERY BYPASS GRAFT  03/27/2011   x3, Dr Tharon Aquas Trigt    . ESOPHAGOGASTRODUODENOSCOPY ENDOSCOPY  04/12/11   cauterization of bleeding ulcer  and artery in duodenum and stomach   . INGUINAL HERNIA REPAIR  11/06/2011   Procedure: HERNIA REPAIR INGUINAL ADULT;  Surgeon: Earnstine Regal, MD;  Location: [facility];  Service: General;  Laterality:  N/A;  Repair left inguinal hernia with mesh  . WISDOM TOOTH EXTRACTION     Social History   Occupational History  . retired     Social History Main Topics  . Smoking status: Former Smoker    Packs/day: 1.00    Years: 25.00    Quit date: 03/04/1988  . Smokeless tobacco: Never Used  . Alcohol use 0.6 oz/week    1 Cans of beer per week     Comment: occ  . Drug use: No  . Sexual activity: Not on file

## 2017-05-30 NOTE — Therapy (Signed)
[place], [place], [address] Phone: [phone]   Fax:  [phone]  Physical Therapy Treatment  Patient Details  Name: Blake [name] MRN: [id number] Date of Birth: 03-04-1946 Referring Provider: Joni Fears, MD  Encounter Date: 05/30/2017      PT End of Session - 05/30/17 1626    Visit Number 17   Number of Visits 25   Date for PT Re-Evaluation 06/21/17   Authorization Type UHC MCR   PT Start Time 1627   PT Stop Time 1713   PT Time Calculation (min) 46 min   Activity Tolerance Patient tolerated treatment well   Behavior During Therapy [facility] for tasks assessed/performed      Past Medical History:  Diagnosis Date  . Angina    none since CABG  . Arthritis   . Bladder spasm    takes Levsin as needed  . CAD (coronary artery disease)   . Diverticulosis   . Duodenal ulcer    hx of  . Dysrhythmia    atrial fib post Cabg/ takes Eliquis daily as well as Metoprolol   . GERD (gastroesophageal reflux disease)    takes Protonix daily  . History of blood transfusion    not abnormal reaction  . History of colon polyps    benign  . History of kidney stones    hx of  . Hyperlipidemia    takes Atorvastatin daily  . HYPERLIPIDEMIA 05/25/2009  . Joint pain   . Nocturia   . PAF (paroxysmal atrial fibrillation) ([place])     Past Surgical History:  Procedure Laterality Date  . CARDIAC CATHETERIZATION     6/12  . COLONOSCOPY    . CORONARY ARTERY BYPASS GRAFT  03/27/2011   x3, Dr Tharon Aquas Trigt    . ESOPHAGOGASTRODUODENOSCOPY ENDOSCOPY  04/12/11   cauterization of bleeding ulcer  and artery in duodenum and stomach   . INGUINAL HERNIA REPAIR  11/06/2011   Procedure: HERNIA REPAIR INGUINAL ADULT;  Surgeon: Earnstine Regal, MD;  Location: [facility];  Service: General;  Laterality: N/A;  Repair left inguinal hernia with mesh  . WISDOM TOOTH EXTRACTION      There were no vitals filed for this visit.       Subjective Assessment - 05/30/17 1627    Subjective Denies pain. Still feels weak lifting arm.    Patient Stated Goals do everything I could before the shoulder pain, woodworking   Currently in Pain? No/denies                         [facility] Adult PT Treatment/Exercise - 05/30/17 0001      Shoulder Exercises: Supine   Flexion 15 reps   Shoulder Flexion Weight (lbs) 2lb x7, rest without weight   Flexion Limitations reclined position   Other Supine Exercises rythmic stabs     Shoulder Exercises: Seated   Flexion Limitations table slide to liftoff     Shoulder Exercises: Prone   Flexion 20 reps   Flexion Limitations thumb up   Extension 20 reps  2lc   External Rotation 15 reps   Horizontal ABduction 1 15 reps   Horizontal ABduction 1 Limitations thumb up     Shoulder Exercises: Standing   External Rotation 20 reps   Theraband Level (Shoulder External Rotation) Level 1 (Yellow)   Other Standing Exercises chops yellow tband     Shoulder Exercises: ROM/Strengthening   UBE (Upper Arm  Bike) 3'/3' L3   Other ROM/Strengthening Exercises 2# cuff weight up wall ladder     Shoulder Exercises: Isometric Strengthening   External Rotation Limitations 10x5s                  PT Short Term Goals - 04/03/17 0951      PT SHORT TERM GOAL #1   Title PROM within 10 deg of L upper extremity by 5/18   Baseline flexion within 10 deg, abd within 20, ER at 90 abd 40 vs 80 on L   Status Partially Met     PT SHORT TERM GOAL #2   Title Pt will be independent with HEP as it has progressed on 5/18   Baseline verbalizes independence and compliance as it has been established at this point, will continue to progress as appropriate   Status Achieved           PT Long Term Goals - 04/22/17 1415      PT LONG TERM GOAL #1   Title Pt will be able to use L UE to reach overhead and grab dishes from overhead cabinets without increase in pain by 7/13   Baseline unable     Status On-going     PT LONG TERM GOAL #2   Title Pt will be able to return to light activities of wood working with minimal limitation by shoulder to return to PLOF.    Baseline unable   Status On-going     PT LONG TERM GOAL #3   Title AROM within 15 deg of R upper extremity to improve functional use of arm   Baseline progressing   Status On-going     PT LONG TERM GOAL #4   Title GHJ MMT grossly 4+/5 to indicate proper support to GHJ by surrounding soft tissue.    Baseline grossly 3-/5   Status On-going     PT LONG TERM GOAL #5   Title FOTO to 68% ability to indicate significant improvement in functional ability   Baseline to be assessed at next visit   Status On-going               Plan - 05/30/17 1718    Clinical Impression Statement Cont limitation most notable in external rotation. Able to perform flexion but abducts along the way limiting ability to move weight. Will continue to benefit from strength challenges to continue improving functional strength in arm.    PT Treatment/Interventions ADLs/Self Care Home Management;Cryotherapy;Electrical Stimulation;Functional mobility training;Traction;Moist Heat;Therapeutic activities;Therapeutic exercise;Neuromuscular re-education;Patient/family education;Passive range of motion;Scar mobilization;Manual techniques;Taping;Dry needling   PT Next Visit Plan endurance, GCODE   PT Home Exercise Plan scapular retraction, putty work, ice, resting on pillow, isometric IR, ER, flexion, ext , PROM flexion & ER; table slides flexion & scaption; supine protraction, sidelying ER, supine flexion with wand. IR stretch with towel, prone retraction+ext, prone horiz abd, seated AROM flx in mirror. wall slide, single arm row, ER stretch on table, standing GHJ ext with wand. ER stretch, ER red tband, resisted flexion yellow, wall lift off   Consulted and Agree with Plan of Care Patient      Patient will benefit from skilled therapeutic intervention  in order to improve the following deficits and impairments:  Decreased range of motion, Impaired UE functional use, Increased muscle spasms, Decreased activity tolerance, Pain, Improper body mechanics, Impaired flexibility, Decreased scar mobility, Decreased mobility, Decreased strength, Postural dysfunction  Visit Diagnosis: Acute pain of right shoulder  Muscle weakness (generalized)  Stiffness of right shoulder, not elsewhere classified     Problem List Patient Active Problem List   Diagnosis Date Noted  . Mitral valve insufficiency   . Diastolic dysfunction with chronic heart failure ([place]) 02/12/2017  . AC (acromioclavicular) arthritis 01/29/2017  . Spinal stenosis of lumbar region at multiple levels 12/01/2015  . Routine general medical examination at a health care facility 11/08/2015  . Obesity (BMI 30-39.9) 10/28/2012  . BPH (benign prostatic hyperplasia) 06/08/2011  . Gastric ulcer 06/08/2011  . Coronary artery disease due to lipid rich plaque 05/03/2011  . Atrial fibrillation, chronic ([place]) 04/05/2011  . Dyslipidemia, goal LDL below 70 05/25/2009  . BINGE EATING DISORDER 05/25/2009   Coleta Grosshans C. Arcenia Scarbro PT, DPT 05/30/17 5:21 PM   [facility] [address] [place], [place], [address] Phone: [phone]   Fax:  [phone]  Name: Blake [name] MRN: [id number] Date of Birth: 10-04-1946

## 2017-06-03 ENCOUNTER — Ambulatory Visit: Payer: Medicare Other | Admitting: Physical Therapy

## 2017-06-03 ENCOUNTER — Ambulatory Visit (INDEPENDENT_AMBULATORY_CARE_PROVIDER_SITE_OTHER): Payer: Medicare Other | Admitting: Orthopaedic Surgery

## 2017-06-03 ENCOUNTER — Encounter: Payer: Self-pay | Admitting: Physical Therapy

## 2017-06-03 DIAGNOSIS — M6281 Muscle weakness (generalized): Secondary | ICD-10-CM | POA: Diagnosis not present

## 2017-06-03 DIAGNOSIS — M25511 Pain in right shoulder: Secondary | ICD-10-CM

## 2017-06-03 DIAGNOSIS — M25611 Stiffness of right shoulder, not elsewhere classified: Secondary | ICD-10-CM

## 2017-06-03 NOTE — Therapy (Addendum)
[facility] [address] [place], , [address] Phone: [phone]   Fax:  [phone]  Physical Therapy Treatment  Patient Details  Name: Blake [name] MRN: [id number] Date of Birth: 11/29/1945 Referring Provider: Peter Whitfield, MD  Encounter Date: 06/03/2017      PT End of Session - 06/03/17 0932    Visit Number 18   Number of Visits 25   Date for PT Re-Evaluation 06/21/17   Authorization Type UHC MCR   PT Start Time 0926   PT Stop Time 1011   PT Time Calculation (min) 45 min   Activity Tolerance Patient tolerated treatment well   Behavior During Therapy WFL for tasks assessed/performed      Past Medical History:  Diagnosis Date  . Angina    none since CABG  . Arthritis   . Bladder spasm    takes Levsin as needed  . CAD (coronary artery disease)   . Diverticulosis   . Duodenal ulcer    hx of  . Dysrhythmia    atrial fib post Cabg/ takes Eliquis daily as well as Metoprolol   . GERD (gastroesophageal reflux disease)    takes Protonix daily  . History of blood transfusion    not abnormal reaction  . History of colon polyps    benign  . History of kidney stones    hx of  . Hyperlipidemia    takes Atorvastatin daily  . HYPERLIPIDEMIA 05/25/2009  . Joint pain   . Nocturia   . PAF (paroxysmal atrial fibrillation) ([facility])     Past Surgical History:  Procedure Laterality Date  . CARDIAC CATHETERIZATION     6/12  . COLONOSCOPY    . CORONARY ARTERY BYPASS GRAFT  03/27/2011   x3, Dr Peter Van Trigt    . ESOPHAGOGASTRODUODENOSCOPY ENDOSCOPY  04/12/11   cauterization of bleeding ulcer  and artery in duodenum and stomach   . INGUINAL HERNIA REPAIR  11/06/2011   Procedure: HERNIA REPAIR INGUINAL ADULT;  Surgeon: Todd M Gerkin, MD;  Location: [facility];  Service: General;  Laterality: N/A;  Repair left inguinal hernia with mesh  . WISDOM TOOTH EXTRACTION      There were no vitals filed for this visit.       Subjective Assessment - 06/03/17 0932    Subjective Pt reports recently waking 3-4 times/night with cramping in ankles, could shoulder exercises be causing this? Some shoulder soreness with activities but not bad.    Patient Stated Goals do everything I could before the shoulder pain, woodworking   Currently in Pain? No/denies                         [facility] Adult PT Treatment/Exercise - 06/03/17 0001      Shoulder Exercises: Supine   Protraction 20 reps     Shoulder Exercises: Seated   External Rotation Limitations in abd on wedge, ER liftoff   Other Seated Exercises biceps curls to fatigue 2lb- approx 40     Shoulder Exercises: Prone   Extension 20 reps  row to triceps kick   Other Prone Exercises extension diagonal -lower & upper- palm down     Shoulder Exercises: Sidelying   External Rotation Limitations ER to flexion reach     Shoulder Exercises: Standing   Flexion Limitations ranger 3# wrist weight   Row Limitations free motion rows 15x 10lb, 15x 7lb   Other Standing Exercises D2 ext 7lb free motion       Shoulder Exercises: ROM/Strengthening   UBE (Upper Arm Bike) 3'/3' L2                PT Education - 06/03/17 1011    Education provided Yes   Education Details exercise form/rationale, contact cardiologist   Person(s) Educated Patient   Methods Explanation;Demonstration;Tactile cues;Verbal cues;Handout   Comprehension Verbalized understanding;Returned demonstration;Verbal cues required;Tactile cues required;Need further instruction          PT Short Term Goals - 04/03/17 0951      PT SHORT TERM GOAL #1   Title PROM within 10 deg of L upper extremity by 5/18   Baseline flexion within 10 deg, abd within 20, ER at 90 abd 40 vs 80 on L   Status Partially Met     PT SHORT TERM GOAL #2   Title Pt will be independent with HEP as it has progressed on 5/18   Baseline verbalizes independence and compliance as it has been established at this  point, will continue to progress as appropriate   Status Achieved           PT Long Term Goals - 04/22/17 1415      PT LONG TERM GOAL #1   Title Pt will be able to use L UE to reach overhead and grab dishes from overhead cabinets without increase in pain by 7/13   Baseline unable    Status On-going     PT LONG TERM GOAL #2   Title Pt will be able to return to light activities of wood working with minimal limitation by shoulder to return to PLOF.    Baseline unable   Status On-going     PT LONG TERM GOAL #3   Title AROM within 15 deg of R upper extremity to improve functional use of arm   Baseline progressing   Status On-going     PT LONG TERM GOAL #4   Title GHJ MMT grossly 4+/5 to indicate proper support to GHJ by surrounding soft tissue.    Baseline grossly 3-/5   Status On-going     PT LONG TERM GOAL #5   Title FOTO to 68% ability to indicate significant improvement in functional ability   Baseline to be assessed at next visit   Status On-going               Plan - 06/03/17 0933    Clinical Impression Statement Advised pt to discuss fluid intake with cardiologist due to bilateral ankle cramping, esp with recent changes. Utilized freemotion and ranger with resistance today to increase challenge to control in reaching an doverhead motions, guarding required.    PT Treatment/Interventions ADLs/Self Care Home Management;Cryotherapy;Electrical Stimulation;Functional mobility training;Traction;Moist Heat;Therapeutic activities;Therapeutic exercise;Neuromuscular re-education;Patient/family education;Passive range of motion;Scar mobilization;Manual techniques;Taping;Dry needling   PT Next Visit Plan endurance, GCODE   PT Home Exercise Plan scapular retraction, putty work, ice, resting on pillow, isometric IR, ER, flexion, ext , PROM flexion & ER; table slides flexion & scaption; supine protraction, sidelying ER, supine flexion with wand. IR stretch with towel, prone  retraction+ext, prone horiz abd, seated AROM flx in mirror. wall slide, single arm row, ER stretch on table, standing GHJ ext with wand. ER stretch, ER red tband, resisted flexion yellow, wall lift off   Consulted and Agree with Plan of Care Patient      Patient will benefit from skilled therapeutic intervention in order to improve the following deficits and impairments:  Decreased range of motion, Impaired UE functional use, Increased   muscle spasms, Decreased activity tolerance, Pain, Improper body mechanics, Impaired flexibility, Decreased scar mobility, Decreased mobility, Decreased strength, Postural dysfunction  Visit Diagnosis: Acute pain of right shoulder  Muscle weakness (generalized)  Stiffness of right shoulder, not elsewhere classified     Problem List Patient Active Problem List   Diagnosis Date Noted  . Mitral valve insufficiency   . Diastolic dysfunction with chronic heart failure ([facility]) 02/12/2017  . AC (acromioclavicular) arthritis 01/29/2017  . Spinal stenosis of lumbar region at multiple levels 12/01/2015  . Routine general medical examination at a health care facility 11/08/2015  . Obesity (BMI 30-39.9) 10/28/2012  . BPH (benign prostatic hyperplasia) 06/08/2011  . Gastric ulcer 06/08/2011  . Coronary artery disease due to lipid rich plaque 05/03/2011  . Atrial fibrillation, chronic ([facility]) 04/05/2011  . Dyslipidemia, goal LDL below 70 05/25/2009  . BINGE EATING DISORDER 05/25/2009   Jessica C. Hightower PT, DPT 06/03/17 10:25 AM   [facility] [address] [place], [place], [address] Phone: [phone]   Fax:  [phone]  Name: Blake [name] MRN: [id number] Date of Birth: 02/16/1946   

## 2017-06-05 ENCOUNTER — Encounter: Payer: Self-pay | Admitting: Cardiology

## 2017-06-11 ENCOUNTER — Encounter: Payer: Self-pay | Admitting: Physical Therapy

## 2017-06-11 ENCOUNTER — Ambulatory Visit: Payer: Medicare Other | Admitting: Physical Therapy

## 2017-06-11 DIAGNOSIS — M25511 Pain in right shoulder: Secondary | ICD-10-CM | POA: Diagnosis not present

## 2017-06-11 DIAGNOSIS — M25611 Stiffness of right shoulder, not elsewhere classified: Secondary | ICD-10-CM

## 2017-06-11 DIAGNOSIS — M6281 Muscle weakness (generalized): Secondary | ICD-10-CM | POA: Diagnosis not present

## 2017-06-11 NOTE — Therapy (Signed)
[place], [place], [address] Phone: [phone]   Fax:  [phone]  Physical Therapy Treatment  Patient Details  Name: Blake [name] MRN: [id number] Date of Birth: [birth year] Referring Provider: Joni Fears, MD  Encounter Date: 06/11/2017      PT End of Session - 06/11/17 1103    Visit Number 19   Number of Visits 25   Date for PT Re-Evaluation 06/21/17   Authorization Type UHC MCR   PT Start Time 6962   PT Stop Time 1141   PT Time Calculation (min) 38 min   Activity Tolerance Patient tolerated treatment well   Behavior During Therapy [facility] for tasks assessed/performed      Past Medical History:  Diagnosis Date  . Angina    none since CABG  . Arthritis   . Bladder spasm    takes Levsin as needed  . CAD (coronary artery disease)   . Diverticulosis   . Duodenal ulcer    hx of  . Dysrhythmia    atrial fib post Cabg/ takes Eliquis daily as well as Metoprolol   . GERD (gastroesophageal reflux disease)    takes Protonix daily  . History of blood transfusion    not abnormal reaction  . History of colon polyps    benign  . History of kidney stones    hx of  . Hyperlipidemia    takes Atorvastatin daily  . HYPERLIPIDEMIA 05/25/2009  . Joint pain   . Nocturia   . PAF (paroxysmal atrial fibrillation) ([place])     Past Surgical History:  Procedure Laterality Date  . CARDIAC CATHETERIZATION     6/12  . COLONOSCOPY    . CORONARY ARTERY BYPASS GRAFT  03/27/2011   x3, Dr Tharon Aquas Trigt    . ESOPHAGOGASTRODUODENOSCOPY ENDOSCOPY  04/12/11   cauterization of bleeding ulcer  and artery in duodenum and stomach   . INGUINAL HERNIA REPAIR  11/06/2011   Procedure: HERNIA REPAIR INGUINAL ADULT;  Surgeon: Earnstine Regal, MD;  Location: [facility];  Service: General;  Laterality: N/A;  Repair left inguinal hernia with mesh  . WISDOM TOOTH EXTRACTION      There were no vitals filed for this visit.       Subjective Assessment - 06/11/17 1103    Subjective Reports shoulder is feeling fine. Wants to go over exercises   Patient Stated Goals do everything I could before the shoulder pain, woodworking   Currently in Pain? No/denies                         [facility] Adult PT Treatment/Exercise - 06/11/17 0001      Shoulder Exercises: Standing   Flexion Limitations 2# weight; with yellow band around wrists   Other Standing Exercises triceps kicks 2#   Other Standing Exercises diagonals yellow tband                PT Education - 06/11/17 1144    Education provided Yes   Education Details compensation in ADLs, exercise form/rationale, HEP   Person(s) Educated Patient   Methods Explanation;Demonstration;Tactile cues;Verbal cues;Handout   Comprehension Verbalized understanding;Returned demonstration;Verbal cues required;Tactile cues required;Need further instruction          PT Short Term Goals - 04/03/17 0951      PT SHORT TERM GOAL #1   Title PROM within 10 deg of L upper extremity by 5/18   Baseline flexion within 10  deg, abd within 20, ER at 90 abd 40 vs 80 on L   Status Partially Met     PT SHORT TERM GOAL #2   Title Pt will be independent with HEP as it has progressed on 5/18   Baseline verbalizes independence and compliance as it has been established at this point, will continue to progress as appropriate   Status Achieved           PT Long Term Goals - 04/22/17 1415      PT LONG TERM GOAL #1   Title Pt will be able to use L UE to reach overhead and grab dishes from overhead cabinets without increase in pain by 7/13   Baseline unable    Status On-going     PT LONG TERM GOAL #2   Title Pt will be able to return to light activities of wood working with minimal limitation by shoulder to return to PLOF.    Baseline unable   Status On-going     PT LONG TERM GOAL #3   Title AROM within 15 deg of R upper extremity to improve functional use of arm    Baseline progressing   Status On-going     PT LONG TERM GOAL #4   Title GHJ MMT grossly 4+/5 to indicate proper support to GHJ by surrounding soft tissue.    Baseline grossly 3-/5   Status On-going     PT LONG TERM GOAL #5   Title FOTO to 68% ability to indicate significant improvement in functional ability   Baseline to be assessed at next visit   Status On-going               Plan - 06/11/17 1142    Clinical Impression Statement Large amount of time in session today utilized to review and establish final HEP. Discussed long term challenges/compensations that he may need to establish. Pt does not demo activation for external rotation in abduction above 45 deg, at this point I do not expect him to obtain that motion due to time spent on it. pt was understanding after discussing. Next visit will be d/c and I asked him to keep a list of difficult ADLs that we may need to create a compensatory pattern for.    PT Treatment/Interventions ADLs/Self Care Home Management;Cryotherapy;Electrical Stimulation;Functional mobility training;Traction;Moist Heat;Therapeutic activities;Therapeutic exercise;Neuromuscular re-education;Patient/family education;Passive range of motion;Scar mobilization;Manual techniques;Taping;Dry needling   PT Next Visit Plan d/c   PT Home Exercise Plan scapular retraction, putty work, ice, resting on pillow, isometric IR, ER, flexion, ext , PROM flexion & ER; table slides flexion & scaption; supine protraction, sidelying ER, supine flexion with wand. IR stretch with towel, prone retraction+ext, prone horiz abd, seated AROM flx in mirror. wall slide, single arm row, ER stretch on table, standing GHJ ext with wand. ER stretch, ER red tband, resisted flexion yellow, wall lift off   Consulted and Agree with Plan of Care Patient      Patient will benefit from skilled therapeutic intervention in order to improve the following deficits and impairments:  Decreased range of  motion, Impaired UE functional use, Increased muscle spasms, Decreased activity tolerance, Pain, Improper body mechanics, Impaired flexibility, Decreased scar mobility, Decreased mobility, Decreased strength, Postural dysfunction  Visit Diagnosis: Acute pain of right shoulder  Muscle weakness (generalized)  Stiffness of right shoulder, not elsewhere classified     Problem List Patient Active Problem List   Diagnosis Date Noted  . Mitral valve insufficiency   . Diastolic  dysfunction with chronic heart failure ([place]) 02/12/2017  . AC (acromioclavicular) arthritis 01/29/2017  . Spinal stenosis of lumbar region at multiple levels 12/01/2015  . Routine general medical examination at a health care facility 11/08/2015  . Obesity (BMI 30-39.9) 10/28/2012  . BPH (benign prostatic hyperplasia) 06/08/2011  . Gastric ulcer 06/08/2011  . Coronary artery disease due to lipid rich plaque 05/03/2011  . Atrial fibrillation, chronic ([place]) 04/05/2011  . Dyslipidemia, goal LDL below 70 05/25/2009  . BINGE EATING DISORDER 05/25/2009    Mindy Behnken C. Rooney Swails PT, DPT 06/11/17 12:46 PM   [facility] [address] [place], [place], [address] Phone: [phone]   Fax:  [phone]  Name: Blake [name] MRN: [id number] Date of Birth: [birth year]

## 2017-06-17 NOTE — Progress Notes (Signed)
HPI: Followup coronary artery disease and atrial fibrillation. Abdominal CT in 2012 showed no aneurysm. Patient had coronary artery bypassing graft in 2012 with a LIMA to the LAD, saphenous vein graft to the first diagonal and saphenous vein graft to the acute marginal. Patient did have postoperative atrial fibrillation which resolved. Monitor in October of 2014 showed paroxysmal atrial fibrillation. Nuclear study in October 2014 showed an ejection fraction of 63% and normal perfusion. Last echocardiogram April 2018 showed normal LV systolic function, moderate to severe mitral regurgitation and moderate left atrial enlargement. Seen recently with increased lower extremity edema. Lasix added with improvement. Since last seen, patient denies dyspnea, chest pain, palpitations or syncope. He has not taken his Lasix or potassium in quite some time and has had no edema.  Current Outpatient Prescriptions  Medication Sig Dispense Refill  . alfuzosin (UROXATRAL) 10 MG 24 hr tablet Take 10 mg by mouth daily.     [name] apixaban (ELIQUIS) 5 MG TABS tablet Take 1 tablet (5 mg total) by mouth 2 (two) times daily. 180 tablet 3  . atorvastatin (LIPITOR) 80 MG tablet Take 80 mg by mouth every evening.    . potassium chloride SA (K-DUR,KLOR-CON) 20 MEQ tablet Take 1 tablet (20 mEq total) by mouth daily. 35 tablet 1  . metoprolol tartrate (LOPRESSOR) 25 MG tablet Take 1.5 tablets (37.5 mg total) by mouth 2 (two) times daily. 180 tablet 3   No current facility-administered medications for this visit.      Past Medical History:  Diagnosis Date  . Angina    none since CABG  . Arthritis   . Bladder spasm    takes Levsin as needed  . CAD (coronary artery disease)   . Diverticulosis   . Duodenal ulcer    hx of  . Dysrhythmia    atrial fib post Cabg/ takes Eliquis daily as well as Metoprolol   . GERD (gastroesophageal reflux disease)    takes Protonix daily  . History of blood transfusion    not abnormal  reaction  . History of colon polyps    benign  . History of kidney stones    hx of  . Hyperlipidemia    takes Atorvastatin daily  . HYPERLIPIDEMIA 05/25/2009  . Joint pain   . Nocturia   . PAF (paroxysmal atrial fibrillation) ([place])     Past Surgical History:  Procedure Laterality Date  . CARDIAC CATHETERIZATION     6/12  . COLONOSCOPY    . CORONARY ARTERY BYPASS GRAFT  03/27/2011   x3, Dr Tharon Aquas Trigt    . ESOPHAGOGASTRODUODENOSCOPY ENDOSCOPY  04/12/11   cauterization of bleeding ulcer  and artery in duodenum and stomach   . INGUINAL HERNIA REPAIR  11/06/2011   Procedure: HERNIA REPAIR INGUINAL ADULT;  Surgeon: Earnstine Regal, MD;  Location: [facility];  Service: General;  Laterality: N/A;  Repair left inguinal hernia with mesh  . WISDOM TOOTH EXTRACTION      Social History   Social History  . Marital status: Married    Spouse name: N/A  . Number of children: 4  . Years of education: N/A   Occupational History  . retired     Social History Main Topics  . Smoking status: Former Smoker    Packs/day: 1.00    Years: 25.00    Quit date: 03/04/1988  . Smokeless tobacco: Never Used  . Alcohol use 0.6 oz/week    1 Cans of beer per week  Comment: occ  . Drug use: No  . Sexual activity: Not on file   Other Topics Concern  . Not on file   Social History Narrative   Regular exercise- yes     Family History  Problem Relation Age of Onset  . Coronary artery disease Father   . Heart attack Father 85  . Cancer Father   . Lymphoma Unknown   . Diabetes Sister   . Heart attack Sister 68  . Ovarian cancer Sister   . Atrial fibrillation Brother   . Colon cancer Neg Hx   . Stomach cancer Neg Hx     ROS: no fevers or chills, productive cough, hemoptysis, dysphasia, odynophagia, melena, hematochezia, dysuria, hematuria, rash, seizure activity, orthopnea, PND, pedal edema, claudication. Remaining systems are negative.  Physical Exam: Well-developed well-nourished in no  acute distress.  Skin is warm and dry.  HEENT is normal.  Neck is supple.  Chest is clear to auscultation with normal expansion.  Cardiovascular exam is irregular Abdominal exam nontender or distended. No masses palpated. Extremities show no edema. neuro grossly intact   A/P  1 Permanent atrial fibrillation-plan is for rate control and anticoagulation. Continue metoprolol (increase to 50 BID). Continue apixaban.   2 coronary artery disease-patient is not on aspirin given need for anticoagulation. Continue statin.  3 hypertension-blood pressure is controlled. We will continue with present regimen.  4 hyperlipidemia-continue statin.  5 chronic diastolic congestive heart failure-patient has not required any Lasix in the last couple of months. I will provide a prescription for Lasix 20 mg daily as needed.  6 moderate to severe mitral regurgitation- I have previously reviewed transthoracic echocardiogram which showed probable 3+ mitral regurgitation. He will need follow-up studies in the future (4/19). If he has worsening CHF symptoms we'll need to consider TEE.  Kirk Ruths, MD

## 2017-06-19 ENCOUNTER — Ambulatory Visit: Payer: Medicare Other | Admitting: Physical Therapy

## 2017-06-19 ENCOUNTER — Encounter: Payer: Self-pay | Admitting: Physical Therapy

## 2017-06-19 DIAGNOSIS — M25611 Stiffness of right shoulder, not elsewhere classified: Secondary | ICD-10-CM

## 2017-06-19 DIAGNOSIS — M25511 Pain in right shoulder: Secondary | ICD-10-CM | POA: Diagnosis not present

## 2017-06-19 DIAGNOSIS — M6281 Muscle weakness (generalized): Secondary | ICD-10-CM | POA: Diagnosis not present

## 2017-06-19 NOTE — Therapy (Signed)
[facility] [address] [place], [place], [address] Phone: [phone]   Fax:  [phone]  Physical Therapy Treatment/Discharge Summary  Patient Details  Name: Blake [name] MRN: [id number] Date of Birth: 08-02-1946 Referring Provider: Norlene Campbell, MD  Encounter Date: 06/19/2017      PT End of Session - 06/19/17 0928    Visit Number 20   Number of Visits 25   Date for PT Re-Evaluation 06/21/17   Authorization Type UHC MCR   PT Start Time 0928   PT Stop Time 0951   PT Time Calculation (min) 23 min   Activity Tolerance Patient tolerated treatment well   Behavior During Therapy [facility] for tasks assessed/performed      Past Medical History:  Diagnosis Date  . Angina    none since CABG  . Arthritis   . Bladder spasm    takes Levsin as needed  . CAD (coronary artery disease)   . Diverticulosis   . Duodenal ulcer    hx of  . Dysrhythmia    atrial fib post Cabg/ takes Eliquis daily as well as Metoprolol   . GERD (gastroesophageal reflux disease)    takes Protonix daily  . History of blood transfusion    not abnormal reaction  . History of colon polyps    benign  . History of kidney stones    hx of  . Hyperlipidemia    takes Atorvastatin daily  . HYPERLIPIDEMIA 05/25/2009  . Joint pain   . Nocturia   . PAF (paroxysmal atrial fibrillation) ([facility])     Past Surgical History:  Procedure Laterality Date  . CARDIAC CATHETERIZATION     6/12  . COLONOSCOPY    . CORONARY ARTERY BYPASS GRAFT  03/27/2011   x3, Dr Kathlee Nations Trigt    . ESOPHAGOGASTRODUODENOSCOPY ENDOSCOPY  04/12/11   cauterization of bleeding ulcer  and artery in duodenum and stomach   . INGUINAL HERNIA REPAIR  11/06/2011   Procedure: HERNIA REPAIR INGUINAL ADULT;  Surgeon: Velora Heckler, MD;  Location: [facility];  Service: General;  Laterality: N/A;  Repair left inguinal hernia with mesh  . WISDOM TOOTH EXTRACTION      There were no vitals filed for  this visit.      Subjective Assessment - 06/19/17 0928    Subjective Reports feeling great.    Currently in Pain? No/denies            [facility] PT Assessment - 06/19/17 0001      Assessment   Medical Diagnosis R RCR   Referring Provider Norlene Campbell, MD   Onset Date/Surgical Date 01/29/17   Hand Dominance Left     Observation/Other Assessments   Focus on Therapeutic Outcomes (FOTO)  71% ability     AROM   Right Shoulder Flexion 138 Degrees   Right Shoulder ABduction 140 Degrees   Right Shoulder External Rotation 34 Degrees     Strength   Overall Strength Comments Gross strength 4+/5                             PT Education - 06/19/17 0959    Education provided Yes   Education Details review of goals & ROM/MMT, long term strength, functional increase in use of arm.    Person(s) Educated Patient   Methods Explanation   Comprehension Verbalized understanding          PT Short Term Goals -  July 04, 2017 0953      PT SHORT TERM GOAL #1   Title PROM within 10 deg of L upper extremity by 5/18   Baseline achieved   Status Achieved     PT SHORT TERM GOAL #2   Title Pt will be independent with HEP as it has progressed on 5/18   Baseline verbalizes independence and compliance as it has been established at this point, will continue to progress as appropriate   Status Achieved           PT Long Term Goals - Jul 04, 2017 0934      PT LONG TERM GOAL #1   Title Pt will be able to use R UE to reach overhead and grab dishes from overhead cabinets without increase in pain by 7/13   Baseline able   Status Achieved     PT LONG TERM GOAL #2   Title Pt will be able to return to light activities of wood working with minimal limitation by shoulder to return to PLOF.    Baseline has begun returning to wood working and is going well   Status Achieved     PT LONG TERM GOAL #3   Title AROM within 15 deg of R upper extremity to improve functional use of arm    Baseline achieved   Status Achieved     PT LONG TERM GOAL #4   Title GHJ MMT grossly 4+/5 to indicate proper support to GHJ by surrounding soft tissue.    Baseline gross 4+/5   Status Achieved     PT LONG TERM GOAL #5   Title FOTO to 68% ability to indicate significant improvement in functional ability   Baseline 71% ability at d/c   Status Achieved               Plan - 07/04/17 0954    Clinical Impression Statement Pt has met all of his goals at this time and is being d/c to independent program. Discussed utilizing arm to gain functional strength within reason of weight and to continue with HEP as prescribed in order to target approrpaite musculature as he increases activity level. Pt verbalized comfort and understanding and was encouraged to contact us with any further questions.    PT Treatment/Interventions ADLs/Self Care Home Management;Cryotherapy;Electrical Stimulation;Functional mobility training;Traction;Moist Heat;Therapeutic activities;Therapeutic exercise;Neuromuscular re-education;Patient/family education;Passive range of motion;Scar mobilization;Manual techniques;Taping;Dry needling   PT Home Exercise Plan scapular retraction, putty work, ice, resting on pillow, isometric IR, ER, flexion, ext , PROM flexion & ER; table slides flexion & scaption; supine protraction, sidelying ER, supine flexion with wand. IR stretch with towel, prone retraction+ext, prone horiz abd, seated AROM flx in mirror. wall slide, single arm row, ER stretch on table, standing GHJ ext with wand. ER stretch, ER red tband, resisted flexion yellow, wall lift off   Consulted and Agree with Plan of Care Patient      Patient will benefit from skilled therapeutic intervention in order to improve the following deficits and impairments:  Decreased range of motion, Impaired UE functional use, Increased muscle spasms, Decreased activity tolerance, Pain, Improper body mechanics, Impaired flexibility, Decreased  scar mobility, Decreased mobility, Decreased strength, Postural dysfunction  Visit Diagnosis: Acute pain of right shoulder  Muscle weakness (generalized)  Stiffness of right shoulder, not elsewhere classified       G-Codes - 2017-07-04 0957    Functional Assessment Tool Used (Outpatient Only) FOTO 71% ability, clinical judgement   Functional Limitation Carrying, moving and handling objects  Carrying, Moving and Handling Objects Goal Status [phone]) At least 20 percent but less than 40 percent impaired, limited or restricted   Carrying, Moving and Handling Objects Discharge Status [phone]) At least 20 percent but less than 40 percent impaired, limited or restricted      Problem List Patient Active Problem List   Diagnosis Date Noted  . Mitral valve insufficiency   . Diastolic dysfunction with chronic heart failure ([place]) 02/12/2017  . AC (acromioclavicular) arthritis 01/29/2017  . Spinal stenosis of lumbar region at multiple levels 12/01/2015  . Routine general medical examination at a health care facility 11/08/2015  . Obesity (BMI 30-39.9) 10/28/2012  . BPH (benign prostatic hyperplasia) 06/08/2011  . Gastric ulcer 06/08/2011  . Coronary artery disease due to lipid rich plaque 05/03/2011  . Atrial fibrillation, chronic ([place]) 04/05/2011  . Dyslipidemia, goal LDL below 70 05/25/2009  . BINGE EATING DISORDER 05/25/2009    PHYSICAL THERAPY DISCHARGE SUMMARY  Visits from Start of Care: 20  Current functional level related to goals / functional outcomes: See above   Remaining deficits: See above   Education / Equipment: Anatomy of condition, POC, HEP, exercise form/rationale  Plan: Patient agrees to discharge.  Patient goals were met. Patient is being discharged due to meeting the stated rehab goals.  ?????    Clariece Roesler C. Javiel Canepa PT, DPT 06/19/17 10:01 AM    [facility] [address] [place], [place],  [address] Phone: [phone]   Fax:  [phone]  Name: ARTAVIOUS TREBILCOCK MRN: [id number] Date of Birth: 12/26/1945

## 2017-06-27 ENCOUNTER — Encounter: Payer: Self-pay | Admitting: Cardiology

## 2017-06-27 ENCOUNTER — Ambulatory Visit (INDEPENDENT_AMBULATORY_CARE_PROVIDER_SITE_OTHER): Payer: Medicare Other | Admitting: Cardiology

## 2017-06-27 VITALS — BP 116/80 | HR 88 | Ht 66.0 in | Wt 190.0 lb

## 2017-06-27 DIAGNOSIS — I340 Nonrheumatic mitral (valve) insufficiency: Secondary | ICD-10-CM | POA: Diagnosis not present

## 2017-06-27 DIAGNOSIS — I2510 Atherosclerotic heart disease of native coronary artery without angina pectoris: Secondary | ICD-10-CM | POA: Diagnosis not present

## 2017-06-27 DIAGNOSIS — I482 Chronic atrial fibrillation: Secondary | ICD-10-CM

## 2017-06-27 DIAGNOSIS — I4821 Permanent atrial fibrillation: Secondary | ICD-10-CM

## 2017-06-27 MED ORDER — FUROSEMIDE 20 MG PO TABS: 20.0000 mg | ORAL_TABLET | Freq: Every day | ORAL | 6 refills | Status: DC | PRN

## 2017-06-27 MED ORDER — METOPROLOL TARTRATE 50 MG PO TABS: 50.0000 mg | ORAL_TABLET | Freq: Two times a day (BID) | ORAL | 3 refills | Status: DC

## 2017-06-27 NOTE — Patient Instructions (Signed)
Medication Instructions:   METOPROLOL 50 MG TWICE DAILY  FUROSEMIDE 20 MG ONCE DAILY AS NEEDED FOR SWELLING OR SHORTNESS OF BREATH  Follow-Up:  Your physician wants you to follow-up in: [place] will receive a reminder letter in the mail two months in advance. If you don't receive a letter, please call our office to schedule the follow-up appointment.   If you need a refill on your cardiac medications before your next appointment, please call your pharmacy.

## 2017-08-06 ENCOUNTER — Other Ambulatory Visit: Payer: Self-pay | Admitting: Cardiology

## 2017-08-28 DIAGNOSIS — R3915 Urgency of urination: Secondary | ICD-10-CM | POA: Diagnosis not present

## 2017-08-28 DIAGNOSIS — N401 Enlarged prostate with lower urinary tract symptoms: Secondary | ICD-10-CM | POA: Diagnosis not present

## 2017-08-28 DIAGNOSIS — N3281 Overactive bladder: Secondary | ICD-10-CM | POA: Diagnosis not present

## 2017-08-28 DIAGNOSIS — R972 Elevated prostate specific antigen [PSA]: Secondary | ICD-10-CM | POA: Diagnosis not present

## 2017-08-28 LAB — PSA: PSA: 6.3

## 2017-09-17 ENCOUNTER — Other Ambulatory Visit: Payer: Self-pay

## 2017-09-17 ENCOUNTER — Observation Stay (HOSPITAL_COMMUNITY)
Admission: EM | Admit: 2017-09-17 | Discharge: 2017-09-18 | Disposition: A | Discharge status: Home / Self Care | Payer: Medicare Other | Attending: Internal Medicine | Admitting: Internal Medicine

## 2017-09-17 ENCOUNTER — Encounter (HOSPITAL_COMMUNITY): Payer: Self-pay

## 2017-09-17 ENCOUNTER — Ambulatory Visit: Payer: Medicare Other | Admitting: Family Medicine

## 2017-09-17 ENCOUNTER — Telehealth: Payer: Self-pay | Admitting: Family Medicine

## 2017-09-17 DIAGNOSIS — I480 Paroxysmal atrial fibrillation: Secondary | ICD-10-CM | POA: Insufficient documentation

## 2017-09-17 DIAGNOSIS — Z7901 Long term (current) use of anticoagulants: Secondary | ICD-10-CM | POA: Insufficient documentation

## 2017-09-17 DIAGNOSIS — I2510 Atherosclerotic heart disease of native coronary artery without angina pectoris: Secondary | ICD-10-CM | POA: Diagnosis not present

## 2017-09-17 DIAGNOSIS — I5032 Chronic diastolic (congestive) heart failure: Secondary | ICD-10-CM | POA: Diagnosis not present

## 2017-09-17 DIAGNOSIS — N400 Enlarged prostate without lower urinary tract symptoms: Secondary | ICD-10-CM | POA: Insufficient documentation

## 2017-09-17 DIAGNOSIS — E785 Hyperlipidemia, unspecified: Secondary | ICD-10-CM | POA: Insufficient documentation

## 2017-09-17 DIAGNOSIS — I959 Hypotension, unspecified: Secondary | ICD-10-CM | POA: Diagnosis not present

## 2017-09-17 DIAGNOSIS — K219 Gastro-esophageal reflux disease without esophagitis: Secondary | ICD-10-CM | POA: Diagnosis not present

## 2017-09-17 DIAGNOSIS — Z86010 Personal history of colonic polyps: Secondary | ICD-10-CM | POA: Diagnosis not present

## 2017-09-17 DIAGNOSIS — Z79899 Other long term (current) drug therapy: Secondary | ICD-10-CM | POA: Insufficient documentation

## 2017-09-17 DIAGNOSIS — I482 Chronic atrial fibrillation: Secondary | ICD-10-CM | POA: Insufficient documentation

## 2017-09-17 DIAGNOSIS — M1990 Unspecified osteoarthritis, unspecified site: Secondary | ICD-10-CM | POA: Diagnosis not present

## 2017-09-17 DIAGNOSIS — Z9889 Other specified postprocedural states: Secondary | ICD-10-CM | POA: Insufficient documentation

## 2017-09-17 DIAGNOSIS — N3289 Other specified disorders of bladder: Secondary | ICD-10-CM | POA: Diagnosis not present

## 2017-09-17 DIAGNOSIS — Z951 Presence of aortocoronary bypass graft: Secondary | ICD-10-CM | POA: Insufficient documentation

## 2017-09-17 DIAGNOSIS — R197 Diarrhea, unspecified: Principal | ICD-10-CM | POA: Insufficient documentation

## 2017-09-17 DIAGNOSIS — I110 Hypertensive heart disease with heart failure: Secondary | ICD-10-CM | POA: Diagnosis not present

## 2017-09-17 DIAGNOSIS — Z87442 Personal history of urinary calculi: Secondary | ICD-10-CM | POA: Insufficient documentation

## 2017-09-17 LAB — COMPREHENSIVE METABOLIC PANEL
ALBUMIN: 3.7 g/dL (ref 3.5–5.0)
ALT: 14 U/L — ABNORMAL LOW (ref 17–63)
AST: 16 U/L (ref 15–41)
Alkaline Phosphatase: 84 U/L (ref 38–126)
Anion gap: 6 (ref 5–15)
BUN: 17 mg/dL (ref 6–20)
CHLORIDE: 110 mmol/L (ref 101–111)
CO2: 20 mmol/L — AB (ref 22–32)
Calcium: 8.9 mg/dL (ref 8.9–10.3)
Creatinine, Ser: 0.88 mg/dL (ref 0.61–1.24)
GFR calc Af Amer: >60 mL/min (ref >60)
GFR calc non Af Amer: >60 mL/min (ref >60)
GLUCOSE: 101 mg/dL — AB (ref 65–99)
POTASSIUM: 4.5 mmol/L (ref 3.5–5.1)
Sodium: 136 mmol/L (ref 135–145)
Total Bilirubin: 1.2 mg/dL (ref 0.3–1.2)
Total Protein: 6.9 g/dL (ref 6.5–8.1)

## 2017-09-17 LAB — URINALYSIS, ROUTINE W REFLEX MICROSCOPIC
BACTERIA UA: NONE SEEN
Bilirubin Urine: NEGATIVE
GLUCOSE, UA: NEGATIVE mg/dL
KETONES UR: NEGATIVE mg/dL
LEUKOCYTES UA: NEGATIVE
Nitrite: NEGATIVE
PROTEIN: NEGATIVE mg/dL
Specific Gravity, Urine: 1.024 (ref 1.005–1.030)
pH: 5 (ref 5.0–8.0)

## 2017-09-17 LAB — I-STAT CG4 LACTIC ACID, ED
Lactic Acid, Venous: 0.78 mmol/L (ref 0.5–1.9)
Lactic Acid, Venous: 0.66 mmol/L (ref 0.5–1.9)

## 2017-09-17 LAB — CBC
HEMATOCRIT: 44.2 % (ref 39.0–52.0)
Hemoglobin: 14.5 g/dL (ref 13.0–17.0)
MCH: 31.9 pg (ref 26.0–34.0)
MCHC: 32.8 g/dL (ref 30.0–36.0)
MCV: 97.1 fL (ref 78.0–100.0)
Platelets: 214 10*3/uL (ref 150–400)
RBC: 4.55 MIL/uL (ref 4.22–5.81)
RDW: 14.4 % (ref 11.5–15.5)
WBC: 12.1 10*3/uL — ABNORMAL HIGH (ref 4.0–10.5)

## 2017-09-17 LAB — LIPASE, BLOOD: LIPASE: 23 U/L (ref 11–51)

## 2017-09-17 LAB — C DIFFICILE QUICK SCREEN W PCR REFLEX
C DIFFICILE (CDIFF) INTERP: NOT DETECTED
C Diff antigen: NEGATIVE
C Diff toxin: NEGATIVE

## 2017-09-17 MED ORDER — PANTOPRAZOLE SODIUM 40 MG PO TBEC
40.0000 mg | DELAYED_RELEASE_TABLET | Freq: Every day | ORAL | Status: DC
  Administered 2017-09-18: 40 mg via ORAL
  Filled 2017-09-17: qty 1

## 2017-09-17 MED ORDER — ONDANSETRON HCL 4 MG PO TABS: 4.0000 mg | ORAL_TABLET | Freq: Four times a day (QID) | ORAL | Status: DC | PRN

## 2017-09-17 MED ORDER — ONDANSETRON HCL 4 MG/2ML IJ SOLN: 4.0000 mg | Freq: Four times a day (QID) | INTRAMUSCULAR | Status: DC | PRN

## 2017-09-17 MED ORDER — APIXABAN 5 MG PO TABS
5.0000 mg | ORAL_TABLET | Freq: Two times a day (BID) | ORAL | Status: DC
  Administered 2017-09-18: 5 mg via ORAL
  Filled 2017-09-17: qty 1

## 2017-09-17 MED ORDER — ALFUZOSIN HCL ER 10 MG PO TB24
10.0000 mg | ORAL_TABLET | Freq: Every day | ORAL | Status: DC
  Administered 2017-09-18: 10 mg via ORAL
  Filled 2017-09-17: qty 1

## 2017-09-17 MED ORDER — HYDROCORTISONE 2.5 % RE CREA: TOPICAL_CREAM | Freq: Four times a day (QID) | RECTAL | Status: DC | PRN

## 2017-09-17 MED ORDER — ACETAMINOPHEN 325 MG PO TABS: 650.0000 mg | ORAL_TABLET | Freq: Four times a day (QID) | ORAL | Status: DC | PRN

## 2017-09-17 MED ORDER — ACETAMINOPHEN 650 MG RE SUPP: 650.0000 mg | Freq: Four times a day (QID) | RECTAL | Status: DC | PRN

## 2017-09-17 MED ORDER — SODIUM CHLORIDE 0.9 % IV BOLUS (SEPSIS)
2000.0000 mL | Freq: Once | INTRAVENOUS | Status: AC
  Administered 2017-09-17: 2000 mL via INTRAVENOUS

## 2017-09-17 MED ORDER — ATORVASTATIN CALCIUM 80 MG PO TABS: 80.0000 mg | ORAL_TABLET | Freq: Every evening | ORAL | Status: DC

## 2017-09-17 MED ORDER — SODIUM CHLORIDE 0.9 % IV SOLN
INTRAVENOUS | Status: DC
  Administered 2017-09-17 – 2017-09-18 (×2): via INTRAVENOUS

## 2017-09-17 MED ORDER — WITCH HAZEL-GLYCERIN EX PADS: MEDICATED_PAD | CUTANEOUS | Status: DC | PRN

## 2017-09-17 NOTE — Care Management Obs Status (Signed)
[place] NOTIFICATION   Patient Details  Name: Blake [name] MRN: [id number] Date of Birth: 1946/01/24   Medicare Observation Status Notification Given:  Yes    Apolonio Schneiders, RN 09/17/2017, 9:01 PM

## 2017-09-17 NOTE — ED Provider Notes (Signed)
[facility] EMERGENCY DEPARTMENT Provider Note   CSN: [id number] Arrival date & time: 09/17/17  6073     History   Chief Complaint No chief complaint on file.   HPI Blake [name] is a 71 y.o. male who presents the emergency department with chief complaint of diarrhea.  He has a past medical history of a similar episode of voluminous diarrhea that occurred about 2 years ago after Thanksgiving and was determined to be entero-aggravated of E coli.  Patient states that he ate [place] on Saturday and had a late Thanksgiving with his son.  He was fine and then 2 days ago began having severe watery diarrhea starting around 4:00 in the morning.  He had about 15 episodes of voluminous watery diarrhea and took some Imodium which slowed his diarrhea down.  He slept until about 3 PM that day woke up and ate a sandwich and was fine until 8 PM when his symptoms began again.  He had about 15 more episodes of voluminous watery diarrhea last one was around 6 AM this morning.  He did take Imodium prior to arrival.  He denies abdominal pain, nausea, vomiting.  He denies bloody stools, mucus.  No other family members have the same symptoms.  He denies any recent foreign travel.  Patient does acknowledge the fact that he feels extremely lightheaded and weak and feels very dehydrated.  He is hypotensive at arrival. HPI  Past Medical History:  Diagnosis Date  . Angina    none since CABG  . Arthritis   . Bladder spasm    takes Levsin as needed  . CAD (coronary artery disease)   . Diverticulosis   . Duodenal ulcer    hx of  . Dysrhythmia    atrial fib post Cabg/ takes Eliquis daily as well as Metoprolol   . GERD (gastroesophageal reflux disease)    takes Protonix daily  . History of blood transfusion    not abnormal reaction  . History of colon polyps    benign  . History of kidney stones    hx of  . Hyperlipidemia    takes Atorvastatin daily  . HYPERLIPIDEMIA 05/25/2009  . Joint  pain   . Nocturia   . PAF (paroxysmal atrial fibrillation) [facility])     Patient Active Problem List   Diagnosis Date Noted  . Mitral valve insufficiency   . Diastolic dysfunction with chronic heart failure ([place]) 02/12/2017  . AC (acromioclavicular) arthritis 01/29/2017  . Spinal stenosis of lumbar region at multiple levels 12/01/2015  . Routine general medical examination at a health care facility 11/08/2015  . Obesity (BMI 30-39.9) 10/28/2012  . BPH (benign prostatic hyperplasia) 06/08/2011  . Gastric ulcer 06/08/2011  . Coronary artery disease due to lipid rich plaque 05/03/2011  . Atrial fibrillation, chronic ([place]) 04/05/2011  . Dyslipidemia, goal LDL below 70 05/25/2009  . BINGE EATING DISORDER 05/25/2009    Past Surgical History:  Procedure Laterality Date  . CARDIAC CATHETERIZATION     6/12  . COLONOSCOPY    . CORONARY ARTERY BYPASS GRAFT  03/27/2011   x3, Dr Tharon Aquas Trigt    . ESOPHAGOGASTRODUODENOSCOPY ENDOSCOPY  04/12/11   cauterization of bleeding ulcer  and artery in duodenum and stomach   . INGUINAL HERNIA REPAIR  11/06/2011   Procedure: HERNIA REPAIR INGUINAL ADULT;  Surgeon: Earnstine Regal, MD;  Location: [facility] ORS;  Service: General;  Laterality: N/A;  Repair left inguinal hernia with mesh  . WISDOM  TOOTH EXTRACTION         Home Medications    Prior to Admission medications   Medication Sig Start Date End Date Taking? Authorizing Provider  alfuzosin (UROXATRAL) 10 MG 24 hr tablet Take 10 mg by mouth daily.  10/22/16   [provider]  atorvastatin (LIPITOR) 80 MG tablet Take 80 mg by mouth every evening.    [provider]  ELIQUIS 5 MG TABS tablet TAKE ONE TABLET BY MOUTH TWICE A DAY 08/06/17   Lelon Perla, MD  furosemide (LASIX) 20 MG tablet Take 1 tablet (20 mg total) by mouth daily as needed. 06/27/17 09/25/17  Lelon Perla, MD  metoprolol tartrate (LOPRESSOR) 50 MG tablet Take 1 tablet (50 mg total) by mouth 2 (two) times daily.  06/27/17 09/25/17  Lelon Perla, MD  potassium chloride SA (K-DUR,KLOR-CON) 20 MEQ tablet Take 1 tablet (20 mEq total) by mouth daily. 02/11/17   Isaiah Serge, NP    Family History Family History  Problem Relation Age of Onset  . Coronary artery disease Father   . Heart attack Father 49  . Cancer Father   . Lymphoma Unknown   . Diabetes Sister   . Heart attack Sister 13  . Ovarian cancer Sister   . Atrial fibrillation Brother   . Colon cancer Neg Hx   . Stomach cancer Neg Hx     Social History Social History   Tobacco Use  . Smoking status: Former Smoker    Packs/day: 1.00    Years: 25.00    Pack years: 25.00    Last attempt to quit: 03/04/1988    Years since quitting: 29.5  . Smokeless tobacco: Never Used  Substance Use Topics  . Alcohol use: Yes    Alcohol/week: 0.6 oz    Types: 1 Cans of beer per week    Comment: occ  . Drug use: No     Allergies   Patient has no known allergies.   Review of Systems Review of Systems Ten systems reviewed and are negative for acute change, except as noted in the HPI.    Physical Exam Updated Vital Signs BP 91/62   Pulse 77   Temp 98.7 F (37.1 C) (Oral)   Resp 14   Ht 5\' 7"  (1.702 m)   Wt 78.5 kg (173 lb)   SpO2 97%   BMI 27.10 kg/m   Physical Exam Physical Exam  Nursing note and vitals reviewed. Constitutional: He appears well-developed and well-nourished. No distress.  HENT:  Head: Normocephalic and atraumatic.  Eyes: Conjunctivae normal are normal. No scleral icterus.  Neck: Normal range of motion. Neck supple.  Cardiovascular: Normal rate, regular rhythm and normal heart sounds.   Pulmonary/Chest: Effort normal and breath sounds normal. No respiratory distress.  Abdominal: Soft. There is no tenderness. No distention, no guarding. Musculoskeletal: He exhibits no edema.  Neurological: He is alert.  Skin: Skin is warm and dry. He is not diaphoretic.  Psychiatric: His behavior is normal.     ED  Treatments / Results  Labs (all labs ordered are listed, but only abnormal results are displayed) Labs Reviewed  COMPREHENSIVE METABOLIC PANEL - Abnormal; Notable for the following components:      Result Value   CO2 20 (*)    Glucose, Bld 101 (*)    ALT 14 (*)    All other components within normal limits  CBC - Abnormal; Notable for the following components:   WBC 12.1 (*)  All other components within normal limits  URINALYSIS, ROUTINE W REFLEX MICROSCOPIC - Abnormal; Notable for the following components:   Hgb urine dipstick MODERATE (*)    Squamous Epithelial / LPF 0-5 (*)    All other components within normal limits  GASTROINTESTINAL PANEL BY PCR, STOOL (REPLACES STOOL CULTURE)  C DIFFICILE QUICK SCREEN W PCR REFLEX  LIPASE, BLOOD    EKG  EKG Interpretation None       Radiology No results found.  Procedures Procedures (including critical care time)  Medications Ordered in ED Medications - No data to display   Initial Impression / Assessment and Plan / ED Course  I have reviewed the triage vital signs and the nursing notes.  Pertinent labs & imaging results that were available during my care of the patient were reviewed by me and considered in my medical decision making (see chart for details).  Clinical Course as of Sep 17 1920  Tue Sep 17, 2017  1649 Patient's BP improving BP: 112/71 [AH]    Clinical Course User Index [AH] Margarita Mail, PA-C    Patient with persistent hypotension despite fluid rehydration.  He did take his blood pressure medications this morning which may be causing him to remain low.  His lactic acid is negative and I do not suspect any sepsis as a source of his hypertension.  I have discussed the case with hospitalist who will observe the patient in the hospital.  I discussed this with the patient who is in agreement with the plan of care.  He has been able to provide a stool sample.  Final Clinical Impressions(s) / ED Diagnoses    Final diagnoses:  Diarrhea, unspecified type  Hypotension, unspecified hypotension type    ED Discharge Orders    None       Margarita Mail, PA-C 09/17/17 1922    Pixie Casino, MD 09/19/17 1106

## 2017-09-17 NOTE — ED Notes (Signed)
Pt ambulated to bathroom without difficulty.

## 2017-09-17 NOTE — ED Notes (Signed)
Attempted report x1. 

## 2017-09-17 NOTE — Care Management Note (Signed)
Case Management Note  Patient Details  Name: GARVIS DOWNUM MRN: [id number] Date of Birth: 27-Aug-1946  Subjective/Objective:                  Diarrhea  Action/Plan: CM spoke with the patient at the bedside in the ED. Patient lives at home with his wife. He reports he does not use any DME or home health services. He does not feel he will need any assistance at discharge. Patient reports he has transportation to his appointments and denies needs for medication assistance.   Expected Discharge Date:    unknown           Expected Discharge Plan:  Home/Self Care  In-House Referral:     Discharge planning Services  CM Consult  Post Acute Care Choice:    Choice offered to:     DME Arranged:    DME Agency:     HH Arranged:    HH Agency:     Status of Service:  In process, will continue to follow  If discussed at Long Length of Stay Meetings, dates discussed:    Additional Comments:  Apolonio Schneiders, RN 09/17/2017, 9:05 PM

## 2017-09-17 NOTE — Telephone Encounter (Signed)
This patient was referred to [facility] because his physician was unavailable.  I called the patient. He's been having severe diarrhea continuously for the past 48 hours. He's lost 3-4 pounds is lightheaded when he stands up.  Advised to go directly to the emergency room for evaluation

## 2017-09-17 NOTE — H&P (Signed)
History and Physical    Blake [name] JYN:[id number] DOB: 06-Jul-1946 DOA: 09/17/2017  PCP: Janith Lima, MD  Patient coming from: Home  Chief Complaint: Diarrhea  HPI: Blake [name] is a 71 y.o. male with medical history significant of atrial fibrillation, hypertension, hyperlipidemia, CAD status post CABG who presents to the emergency department with 2-day history of diffuse diarrhea.  He states that he woke up in the middle of the night on Monday around 3 AM and had about 15 episodes of diarrhea lasting until about 8 AM.  He slept most of the day.  Then Tuesday night, he had about 12 episodes of loose diarrhea all night.  He denies any melena or hematochezia, stools have been watery.  No recent travel or antibiotic use.  He denies any fevers, chills, chest pain, shortness of breath, nausea, vomiting, abdominal pain, dysuria.  He does admit to decreased urine output over the past couple days.  ED Course: He was given 2 L IV fluid bolus with improvement in blood pressure, although still lower than his baseline.  He did admit to lightheadedness.  Stool sample sent for GI PCR.  Review of Systems: As per HPI otherwise 10 point review of systems negative.   Past Medical History:  Diagnosis Date  . Angina    none since CABG  . Arthritis   . Bladder spasm    takes Levsin as needed  . CAD (coronary artery disease)   . Diverticulosis   . Duodenal ulcer    hx of  . Dysrhythmia    atrial fib post Cabg/ takes Eliquis daily as well as Metoprolol   . GERD (gastroesophageal reflux disease)    takes Protonix daily  . History of blood transfusion    not abnormal reaction  . History of colon polyps    benign  . History of kidney stones    hx of  . Hyperlipidemia    takes Atorvastatin daily  . HYPERLIPIDEMIA 05/25/2009  . Joint pain   . Nocturia   . PAF (paroxysmal atrial fibrillation) ([place])     Past Surgical History:  Procedure Laterality Date  . CARDIAC CATHETERIZATION     6/12   . COLONOSCOPY    . CORONARY ARTERY BYPASS GRAFT  03/27/2011   x3, Dr Tharon Aquas Trigt    . ESOPHAGOGASTRODUODENOSCOPY ENDOSCOPY  04/12/11   cauterization of bleeding ulcer  and artery in duodenum and stomach   . INGUINAL HERNIA REPAIR  11/06/2011   Procedure: HERNIA REPAIR INGUINAL ADULT;  Surgeon: Earnstine Regal, MD;  Location: [facility];  Service: General;  Laterality: N/A;  Repair left inguinal hernia with mesh  . WISDOM TOOTH EXTRACTION       reports that he quit smoking about 29 years ago. He has a 25.00 pack-year smoking history. he has never used smokeless tobacco. He reports that he drinks about 0.6 oz of alcohol per week. He reports that he does not use drugs.  No Known Allergies  Family History  Problem Relation Age of Onset  . Coronary artery disease Father   . Heart attack Father 13  . Cancer Father   . Lymphoma Unknown   . Diabetes Sister   . Heart attack Sister 45  . Ovarian cancer Sister   . Atrial fibrillation Brother   . Colon cancer Neg Hx   . Stomach cancer Neg Hx     Prior to Admission medications   Medication Sig Start Date End Date Taking? Authorizing Provider  acetaminophen (TYLENOL) 500 MG tablet Take 1,000 mg by mouth every 6 (six) hours as needed (for radiating leg pain).   Yes [provider]  alfuzosin (UROXATRAL) 10 MG 24 hr tablet Take 10 mg by mouth daily.  10/22/16  Yes [provider]  atorvastatin (LIPITOR) 80 MG tablet Take 80 mg by mouth every evening.   Yes [provider]  diphenhydramine-acetaminophen (TYLENOL PM) 25-500 MG TABS tablet Take 1 tablet by mouth at bedtime as needed (for pain and sleep).   Yes [provider]  ELIQUIS 5 MG TABS tablet TAKE ONE TABLET BY MOUTH TWICE A DAY Patient taking differently: Take 5 mg by mouth two times a day 08/06/17  Yes Crenshaw, Denice Bors, MD  hyoscyamine (LEVSIN SL) 0.125 MG SL tablet Place 0.125 mg under the tongue every 6 (six) hours as needed (for urinary frequency).    Yes [provider]  metoprolol tartrate (LOPRESSOR) 50 MG tablet Take 1 tablet (50 mg total) by mouth 2 (two) times daily. 06/27/17 09/25/17 Yes Lelon Perla, MD  pantoprazole (PROTONIX) 40 MG tablet Take 40 mg by mouth daily before breakfast. 08/06/17  Yes [provider]  sodium chloride (OCEAN) 0.65 % SOLN nasal spray Place 2 sprays into both nostrils 2 (two) times daily as needed for congestion.   Yes [provider]  furosemide (LASIX) 20 MG tablet Take 1 tablet (20 mg total) by mouth daily as needed. Patient not taking: Reported on 09/17/2017 06/27/17 09/25/17  Lelon Perla, MD  potassium chloride SA (K-DUR,KLOR-CON) 20 MEQ tablet Take 1 tablet (20 mEq total) by mouth daily. Patient not taking: Reported on 09/17/2017 02/11/17   Isaiah Serge, NP    Physical Exam: Vitals:   09/17/17 1615 09/17/17 1630 09/17/17 1700 09/17/17 1730  BP: 90/60 92/63 93/62  106/69  Pulse: 79 83 82 74  Resp: 13 16 17 16   Temp:      TempSrc:      SpO2: 96% 97% 99% 98%  Weight:      Height:       Constitutional: NAD, calm, comfortable Eyes: PERRL, lids and conjunctivae normal ENMT: Mucous membranes are moist. Posterior pharynx clear of any exudate or lesions.Normal dentition.  Neck: normal, supple, no masses, no thyromegaly Respiratory: clear to auscultation bilaterally, no wheezing, no crackles. Normal respiratory effort. No accessory muscle use.  Cardiovascular: S1,S2, irreg rhythm, no murmurs / rubs / gallops. No extremity edema.  Abdomen: no tenderness, no masses palpated. No hepatosplenomegaly. Bowel sounds positive.  Musculoskeletal: no clubbing / cyanosis. No joint deformity upper and lower extremities. Good ROM, no contractures. Normal muscle tone.  Skin: no rashes, lesions, ulcers. No induration Neurologic: CN 2-12 grossly intact. Strength 5/5 in all 4.  Psychiatric: Normal judgment and insight. Alert and oriented x 3. Normal mood.   Labs on Admission: I have  personally reviewed following labs and imaging studies  CBC: Recent Labs  Lab 09/17/17 0946  WBC 12.1*  HGB 14.5  HCT 44.2  MCV 97.1  PLT 762   Basic Metabolic Panel: Recent Labs  Lab 09/17/17 0946  NA 136  K 4.5  CL 110  CO2 20*  GLUCOSE 101*  BUN 17  CREATININE 0.88  CALCIUM 8.9   GFR: Estimated Creatinine Clearance: 69.9 mL/min (by C-G formula based on SCr of 0.88 mg/dL). Liver Function Tests: Recent Labs  Lab 09/17/17 0946  AST 16  ALT 14*  ALKPHOS 84  BILITOT 1.2  PROT 6.9  ALBUMIN 3.7  Recent Labs  Lab 09/17/17 0946  LIPASE 23   No results for input(s): AMMONIA in the last 168 hours. Coagulation Profile: No results for input(s): INR, PROTIME in the last 168 hours. Cardiac Enzymes: No results for input(s): CKTOTAL, CKMB, CKMBINDEX, TROPONINI in the last 168 hours. BNP (last 3 results) No results for input(s): PROBNP in the last 8760 hours. HbA1C: No results for input(s): HGBA1C in the last 72 hours. CBG: No results for input(s): GLUCAP in the last 168 hours. Lipid Profile: No results for input(s): CHOL, HDL, LDLCALC, TRIG, CHOLHDL, LDLDIRECT in the last 72 hours. Thyroid Function Tests: No results for input(s): TSH, T4TOTAL, FREET4, T3FREE, THYROIDAB in the last 72 hours. Anemia Panel: No results for input(s): VITAMINB12, FOLATE, FERRITIN, TIBC, IRON, RETICCTPCT in the last 72 hours. Urine analysis:    Component Value Date/Time   COLORURINE YELLOW 09/17/2017 [place] 09/17/2017 0952   LABSPEC 1.024 09/17/2017 0952   PHURINE 5.0 09/17/2017 0952   GLUCOSEU NEGATIVE 09/17/2017 0952   GLUCOSEU NEGATIVE 06/28/2016 1652   HGBUR MODERATE (A) 09/17/2017 0952   BILIRUBINUR NEGATIVE 09/17/2017 0952   BILIRUBINUR NEG 08/25/2011 1012   KETONESUR NEGATIVE 09/17/2017 0952   PROTEINUR NEGATIVE 09/17/2017 0952   UROBILINOGEN 0.2 06/28/2016 1652   NITRITE NEGATIVE 09/17/2017 0952   LEUKOCYTESUR NEGATIVE 09/17/2017 0952   Sepsis Labs:  !!!!!!!!!!!!!!!!!!!!!!!!!!!!!!!!!!!!!!!!!!!! @LABRCNTIP (procalcitonin:4,lacticidven:4) ) Recent Results (from the past 240 hour(s))  C difficile quick scan w PCR reflex     Status: None   Collection Time: 09/17/17  3:54 PM  Result Value Ref Range Status   C Diff antigen NEGATIVE NEGATIVE Final   C Diff toxin NEGATIVE NEGATIVE Final   C Diff interpretation No C. difficile detected.  Final     Radiological Exams on Admission: No results found.  Assessment/Plan Active Problems:   Diarrhea   Diarrhea -GI panel pending, C Diff negative   Hypotension -Improved with IVF bolus in ED, continue IVF -Orthostatic VS   HLD -Continue Lipitor  Permanent A fib -Continue eliquis. Hold lopressor due to low BP   GERD -Continue protonix   CAD s/p CABG 2012  -Follows with Dr. Stanford Breed   Chronic diastolic heart failure -Does not take lasix on daily basis. Not in exacerbation, continue IVF to support BP    DVT prophylaxis: Eliquis  Code Status: Full  Family Communication: No family at bedside Disposition Plan: Home tmrw if improved Consults called: None  Admission status: Observation     Dessa Phi, DO [facility] [email or web address] Password [facility] 09/17/2017, 6:36 PM

## 2017-09-17 NOTE — ED Notes (Signed)
Pt states his current symptoms are similar to a  previous episode of food poisoning.

## 2017-09-17 NOTE — ED Triage Notes (Signed)
Patient complains of 2 days of diarrhea. No vomiting, no fever, alert and oriented, NAD. States that this has called fatigue

## 2017-09-18 ENCOUNTER — Telehealth: Payer: Self-pay | Admitting: *Deleted

## 2017-09-18 ENCOUNTER — Other Ambulatory Visit: Payer: Self-pay

## 2017-09-18 DIAGNOSIS — R197 Diarrhea, unspecified: Secondary | ICD-10-CM

## 2017-09-18 DIAGNOSIS — I959 Hypotension, unspecified: Secondary | ICD-10-CM

## 2017-09-18 LAB — GASTROINTESTINAL PANEL BY PCR, STOOL (REPLACES STOOL CULTURE)

## 2017-09-18 LAB — CBC
HEMATOCRIT: 37 % — AB (ref 39.0–52.0)
HEMOGLOBIN: 12 g/dL — AB (ref 13.0–17.0)
MCH: 31.3 pg (ref 26.0–34.0)
MCHC: 32.4 g/dL (ref 30.0–36.0)
MCV: 96.6 fL (ref 78.0–100.0)
Platelets: 194 10*3/uL (ref 150–400)
RBC: 3.83 MIL/uL — ABNORMAL LOW (ref 4.22–5.81)
RDW: 14.5 % (ref 11.5–15.5)
WBC: 7.8 10*3/uL (ref 4.0–10.5)

## 2017-09-18 LAB — BASIC METABOLIC PANEL
Anion gap: 4 — ABNORMAL LOW (ref 5–15)
BUN: 11 mg/dL (ref 6–20)
CHLORIDE: 114 mmol/L — AB (ref 101–111)
CO2: 21 mmol/L — AB (ref 22–32)
CREATININE: 0.69 mg/dL (ref 0.61–1.24)
Calcium: 8.5 mg/dL — ABNORMAL LOW (ref 8.9–10.3)
GFR calc Af Amer: >60 mL/min (ref >60)
GFR calc non Af Amer: >60 mL/min (ref >60)
GLUCOSE: 80 mg/dL (ref 65–99)
Potassium: 3.4 mmol/L — ABNORMAL LOW (ref 3.5–5.1)
Sodium: 139 mmol/L (ref 135–145)

## 2017-09-18 MED ORDER — POTASSIUM CHLORIDE CRYS ER 20 MEQ PO TBCR
60.0000 meq | EXTENDED_RELEASE_TABLET | Freq: Once | ORAL | Status: AC
  Administered 2017-09-18: 60 meq via ORAL
  Filled 2017-09-18: qty 3

## 2017-09-18 MED ORDER — LOPERAMIDE HCL 2 MG PO TABS: 2.0000 mg | ORAL_TABLET | Freq: Two times a day (BID) | ORAL | 0 refills | Status: DC | PRN

## 2017-09-18 NOTE — Plan of Care (Signed)
  Progressing Health Behavior/Discharge Planning: Ability to manage health-related needs will improve 09/18/2017 1011 - Progressing by Marice Potter, RN Clinical Measurements: Ability to maintain clinical measurements within normal limits will improve 09/18/2017 1011 - Progressing by Marice Potter, RN   Adequate for Discharge Nutrition: Adequate nutrition will be maintained 09/18/2017 1011 - Adequate for Discharge by Marice Potter, RN

## 2017-09-18 NOTE — Telephone Encounter (Signed)
Pt was on TCM list D/C 09/18/17. Tried calling pt to set-up TCM hosp appt no answer x's 10 rings.Will retry later....[name]

## 2017-09-18 NOTE — Discharge Summary (Signed)
Physician Discharge Summary  Blake [name]:[id number] DOB: [birth year] DOA: 09/17/2017  PCP: Janith Lima, MD  Admit date: 09/17/2017 Discharge date: 09/18/2017  Admitted From: Home Disposition:  Home  Recommendations for Outpatient Follow-up:  1. Follow up with PCP in 1 week with repeat BMP   Home Health: No  Equipment/Devices: None  Discharge Condition: Stable CODE STATUS: Full  Diet recommendation: Heart Healthy  Brief/Interim Summary: 71 year old male with history of atrial fibrillation, hypertension, hyperlipidemia, CAD status post CABG presented with 2 day history of diffuse diarrhea. He was started on IV fluids. Stool for C. difficile was negative. Stool GI PCR is pending but patient's symptoms have much improved and his diarrhea has almost resolved. He'll be discharged today.  Discharge Diagnoses:  Active Problems:   Diarrhea   Diarrhea -GI panel pending, but diarrhea has much improved - C Diff negative  - Probably viral diarrhea. Discharge patient home  Hypotension -Blood pressure has much improved with IV fluids  HLD -Continue Lipitor  Permanent A fib -Continue eliquis. Resume metoprolol  GERD -Continue protonix   CAD s/p CABG 2012  -Follows with Dr. Stanford Breed  - Stable for now  Chronic diastolic heart failure -Does not take lasix on daily basis. Not in exacerbation - Outpatient follow-up   Discharge Instructions  Discharge Instructions    Call MD for:  difficulty breathing, headache or visual disturbances   Complete by:  As directed    Call MD for:  extreme fatigue   Complete by:  As directed    Call MD for:  hives   Complete by:  As directed    Call MD for:  persistant dizziness or light-headedness   Complete by:  As directed    Call MD for:  persistant nausea and vomiting   Complete by:  As directed    Call MD for:  severe uncontrolled pain   Complete by:  As directed    Call MD for:  temperature >100.4   Complete by:   As directed    Diet - low sodium heart healthy   Complete by:  As directed    Increase activity slowly   Complete by:  As directed      Allergies as of 09/18/2017   No Known Allergies     Medication List    STOP taking these medications   potassium chloride SA 20 MEQ tablet Commonly known as:  K-DUR,KLOR-CON     TAKE these medications   acetaminophen 500 MG tablet Commonly known as:  TYLENOL Take 1,000 mg by mouth every 6 (six) hours as needed (for radiating leg pain).   alfuzosin 10 MG 24 hr tablet Commonly known as:  UROXATRAL Take 10 mg by mouth daily.   atorvastatin 80 MG tablet Commonly known as:  LIPITOR Take 80 mg by mouth every evening.   diphenhydramine-acetaminophen 25-500 MG Tabs tablet Commonly known as:  TYLENOL PM Take 1 tablet by mouth at bedtime as needed (for pain and sleep).   ELIQUIS 5 MG Tabs tablet Generic drug:  apixaban TAKE ONE TABLET BY MOUTH TWICE A DAY What changed:    how much to take  how to take this  when to take this   furosemide 20 MG tablet Commonly known as:  LASIX Take 1 tablet (20 mg total) by mouth daily as needed.   hyoscyamine 0.125 MG SL tablet Commonly known as:  LEVSIN SL Place 0.125 mg under the tongue every 6 (six) hours as needed (for urinary frequency).  loperamide 2 MG tablet Commonly known as:  IMODIUM A-D Take 1 tablet (2 mg total) by mouth 2 (two) times daily as needed for diarrhea or loose stools.   metoprolol tartrate 50 MG tablet Commonly known as:  LOPRESSOR Take 1 tablet (50 mg total) by mouth 2 (two) times daily.   pantoprazole 40 MG tablet Commonly known as:  PROTONIX Take 40 mg by mouth daily before breakfast.   sodium chloride 0.65 % Soln nasal spray Commonly known as:  OCEAN Place 2 sprays into both nostrils 2 (two) times daily as needed for congestion.      Follow-up Information    Janith Lima, MD. Schedule an appointment as soon as possible for a visit in 1 week(s).    Specialty:  Internal Medicine Why:  with repeat BMP Contact information: [address] [address] [phone]          No Known Allergies  Consultations:  None   Procedures/Studies: None  Subjective: Patient seen and examined at bedside. He states that his diarrhea has much improved. No overnight fever or vomiting. No abdominal pain. Denies any current dizziness  Discharge Exam: Vitals:   09/18/17 0446 09/18/17 0448  BP: (!) 97/55 94/71  Pulse: 80 85  Resp:    Temp:    SpO2:     Vitals:   09/18/17 0110 09/18/17 0445 09/18/17 0446 09/18/17 0448  BP:  100/61 (!) 97/55 94/71  Pulse:  74 80 85  Resp:  18    Temp: 98.9 F (37.2 C) 97.7 F (36.5 C)    TempSrc: Oral Oral    SpO2: 99% 96%    Weight:      Height:        General: Pt is alert, awake, not in acute distress Cardiovascular: Rate controlled, S1/S2 + Respiratory: Bilateral decreased breath sounds at bases  Abdominal: Soft, NT, ND, bowel sounds + Extremities: no edema, no cyanosis    The results of significant diagnostics from this hospitalization (including imaging, microbiology, ancillary and laboratory) are listed below for reference.     Microbiology: Recent Results (from the past 240 hour(s))  C difficile quick scan w PCR reflex     Status: None   Collection Time: 09/17/17  3:54 PM  Result Value Ref Range Status   C Diff antigen NEGATIVE NEGATIVE Final   C Diff toxin NEGATIVE NEGATIVE Final   C Diff interpretation No C. difficile detected.  Final     Labs: BNP (last 3 results) Recent Labs    02/11/17 1609 02/12/17 0054  BNP 122.8* 462.7*   Basic Metabolic Panel: Recent Labs  Lab 09/17/17 0946 09/18/17 0547  NA 136 139  K 4.5 3.4*  CL 110 114*  CO2 20* 21*  GLUCOSE 101* 80  BUN 17 11  CREATININE 0.88 0.69  CALCIUM 8.9 8.5*   Liver Function Tests: Recent Labs  Lab 09/17/17 0946  AST 16  ALT 14*  ALKPHOS 84  BILITOT 1.2  PROT 6.9  ALBUMIN  3.7   Recent Labs  Lab 09/17/17 0946  LIPASE 23   No results for input(s): AMMONIA in the last 168 hours. CBC: Recent Labs  Lab 09/17/17 0946 09/18/17 0547  WBC 12.1* 7.8  HGB 14.5 12.0*  HCT 44.2 37.0*  MCV 97.1 96.6  PLT 214 194   Cardiac Enzymes: No results for input(s): CKTOTAL, CKMB, CKMBINDEX, TROPONINI in the last 168 hours. BNP: Invalid input(s): POCBNP CBG: No results for input(s): GLUCAP  in the last 168 hours. D-Dimer No results for input(s): DDIMER in the last 72 hours. Hgb A1c No results for input(s): HGBA1C in the last 72 hours. Lipid Profile No results for input(s): CHOL, HDL, LDLCALC, TRIG, CHOLHDL, LDLDIRECT in the last 72 hours. Thyroid function studies No results for input(s): TSH, T4TOTAL, T3FREE, THYROIDAB in the last 72 hours.  Invalid input(s): FREET3 Anemia work up No results for input(s): VITAMINB12, FOLATE, FERRITIN, TIBC, IRON, RETICCTPCT in the last 72 hours. Urinalysis    Component Value Date/Time   COLORURINE YELLOW 09/17/2017 0952   APPEARANCEUR CLEAR 09/17/2017 0952   LABSPEC 1.024 09/17/2017 0952   PHURINE 5.0 09/17/2017 0952   GLUCOSEU NEGATIVE 09/17/2017 0952   GLUCOSEU NEGATIVE 06/28/2016 1652   HGBUR MODERATE (A) 09/17/2017 0952   BILIRUBINUR NEGATIVE 09/17/2017 0952   BILIRUBINUR NEG 08/25/2011 1012   KETONESUR NEGATIVE 09/17/2017 0952   PROTEINUR NEGATIVE 09/17/2017 0952   UROBILINOGEN 0.2 06/28/2016 1652   NITRITE NEGATIVE 09/17/2017 0952   LEUKOCYTESUR NEGATIVE 09/17/2017 0952   Sepsis Labs Invalid input(s): PROCALCITONIN,  WBC,  LACTICIDVEN Microbiology Recent Results (from the past 240 hour(s))  C difficile quick scan w PCR reflex     Status: None   Collection Time: 09/17/17  3:54 PM  Result Value Ref Range Status   C Diff antigen NEGATIVE NEGATIVE Final   C Diff toxin NEGATIVE NEGATIVE Final   C Diff interpretation No C. difficile detected.  Final     Time coordinating discharge: 30  minutes  SIGNED:   Aline August, MD  [facility] 09/18/2017, 10:01 AM Pager: [phone]  If 7PM-7AM, please contact night-coverage [email or web address] Password [email or web address]

## 2017-09-18 NOTE — Discharge Summary (Signed)
Patient discharged home via private vehicle. Pt IV removed. AVS discussed with patient. Pt is fully alert upon discharge and skin intact. All question answered for patient. And prescription to be picked up at patient pharmacy

## 2017-09-19 NOTE — Telephone Encounter (Signed)
Transition Care Management Follow-up Telephone Call   Date discharged? 09/18/17   How have you been since you were released from the hospital? Pt states he is during pretty good   Do you understand why you were in the hospital? YES   Do you understand the discharge instructions? YES   Where were you discharged to? Home   Items Reviewed:  Medications reviewed: YES  Allergies reviewed: YES  Dietary changes reviewed: Heart healthy  Referrals reviewed: No referral needed   Functional Questionnaire:   Activities of Daily Living (ADLs):   He states he are independent in the following: ambulation, bathing and hygiene, feeding, continence, grooming, toileting and dressing States he doesn't require assistance   Any transportation issues/concerns?: NO   Any patient concerns? NO   Confirmed importance and date/time of follow-up visits scheduled YES, appt 10/01/17  Provider Appointment booked with Dr Ronnald Ramp  Confirmed with patient if condition begins to worsen call PCP or go to the ER.  Patient was given the office number and encouraged to call back with question or concerns.  YES

## 2017-10-01 ENCOUNTER — Inpatient Hospital Stay: Payer: Medicare Other | Admitting: Internal Medicine

## 2017-10-03 ENCOUNTER — Encounter: Payer: Self-pay | Admitting: Internal Medicine

## 2017-10-24 ENCOUNTER — Other Ambulatory Visit: Payer: Self-pay | Admitting: Cardiology

## 2017-10-24 NOTE — Telephone Encounter (Signed)
REFILL 

## 2017-10-25 ENCOUNTER — Other Ambulatory Visit: Payer: Self-pay

## 2017-11-04 ENCOUNTER — Other Ambulatory Visit: Payer: Self-pay | Admitting: Cardiology

## 2017-11-04 MED ORDER — APIXABAN 5 MG PO TABS: 5.0000 mg | ORAL_TABLET | Freq: Two times a day (BID) | ORAL | 1 refills | Status: DC

## 2017-11-04 NOTE — Telephone Encounter (Signed)
Follow up     *STAT* If patient is at the pharmacy, call can be transferred to refill team.   1. Which medications need to be refilled? (please list name of each medication and dose if known)   ELIQUIS 5 MG TABS tablet    2. Which pharmacy/location (including street and city if local pharmacy) is medication to be sent to?[facility] [address], [place]  3. Do they need a 30 day or 90 day supply? [place]

## 2017-11-04 NOTE — Telephone Encounter (Signed)
New Message   *STAT* If patient is at the pharmacy, call can be transferred to refill team.   1. Which medications need to be refilled? (please list name of each medication and dose if known) Eliquis 5mg  2. Which pharmacy/location (including street and city if local pharmacy) is medication to be sent to? [place]   3. Do they need a 30 day or 90 day supply? [place]

## 2017-12-18 ENCOUNTER — Encounter: Payer: Self-pay | Admitting: Internal Medicine

## 2017-12-25 DIAGNOSIS — R69 Illness, unspecified: Secondary | ICD-10-CM | POA: Diagnosis not present

## 2018-01-20 DIAGNOSIS — R3121 Asymptomatic microscopic hematuria: Secondary | ICD-10-CM | POA: Diagnosis not present

## 2018-01-20 DIAGNOSIS — R972 Elevated prostate specific antigen [PSA]: Secondary | ICD-10-CM | POA: Diagnosis not present

## 2018-01-20 DIAGNOSIS — R3915 Urgency of urination: Secondary | ICD-10-CM | POA: Diagnosis not present

## 2018-01-20 DIAGNOSIS — N401 Enlarged prostate with lower urinary tract symptoms: Secondary | ICD-10-CM | POA: Diagnosis not present

## 2018-01-20 LAB — PSA: PSA: 5.67

## 2018-01-28 ENCOUNTER — Ambulatory Visit: Payer: Self-pay

## 2018-01-28 ENCOUNTER — Encounter: Payer: Self-pay | Admitting: Podiatry

## 2018-01-28 ENCOUNTER — Ambulatory Visit: Payer: Medicare Other | Admitting: Podiatry

## 2018-01-28 VITALS — BP 98/64 | HR 83

## 2018-01-28 DIAGNOSIS — Q828 Other specified congenital malformations of skin: Secondary | ICD-10-CM | POA: Diagnosis not present

## 2018-01-28 DIAGNOSIS — D689 Coagulation defect, unspecified: Secondary | ICD-10-CM

## 2018-01-28 DIAGNOSIS — M722 Plantar fascial fibromatosis: Secondary | ICD-10-CM

## 2018-01-28 NOTE — Progress Notes (Signed)
This patient presents to the office with chief complaint of a painful callus that has developed on the bottom of his left forefoot.  He says this callus is only painful as he enters his shower.  He says the callus is not painful walking in his shoes.  He presents the office today r an evaluation and treatment of this painful callus. Patient is taking eliquiss.  General Appearance  Alert, conversant and in no acute stress.  Vascular  Dorsalis pedis and posterior tibial  pulses are palpable  bilaterally.  Capillary return is within normal limits  bilaterally. Temperature is within normal limits  Bilaterally. Hair present on digits.  Neurologic  Senn-Weinstein monofilament wire test within normal limits  bilaterally. Muscle power within normal limits bilaterally.  Nails Normal nails noted with no evidence of fungal or bacterial infection.  Orthopedic  No limitations of motion of motion feet .  No crepitus or effusions noted.  DJD 1st MPJ  B/L.  Skin  normotropic skin  noted bilaterally.  No signs of infections or ulcers noted.  Porokeratotic lesion at plantar aspect  of fourth toe left foot.   Porokeratosis left foot.  IE  Debride porokeratosis.  Discussed condition with patient.  RTC prn.   Gardiner Barefoot DPM

## 2018-01-29 DIAGNOSIS — R3121 Asymptomatic microscopic hematuria: Secondary | ICD-10-CM | POA: Diagnosis not present

## 2018-01-29 DIAGNOSIS — I700 Atherosclerosis of aorta: Secondary | ICD-10-CM | POA: Diagnosis not present

## 2018-01-30 ENCOUNTER — Other Ambulatory Visit: Payer: Self-pay | Admitting: Internal Medicine

## 2018-01-30 ENCOUNTER — Other Ambulatory Visit: Payer: Self-pay | Admitting: Cardiology

## 2018-01-30 NOTE — Telephone Encounter (Signed)
Pt needs an appt for refills.  Pt is also due for a colonoscopy.

## 2018-01-30 NOTE — Telephone Encounter (Signed)
Left message for patient to call to scheduled an appointment.

## 2018-01-30 NOTE — Telephone Encounter (Signed)
REFILL 

## 2018-02-17 NOTE — Progress Notes (Signed)
HPI: Followup coronary artery disease and atrial fibrillation. Abdominal CT in 2012 showed no aneurysm. Patient had coronary artery bypassing graft in 2012 with a LIMA to the LAD, saphenous vein graft to the first diagonal and saphenous vein graft to the acute marginal. Patient did have postoperative atrial fibrillation which resolved. Monitor in October of 2014 showed paroxysmal atrial fibrillation. Nuclear study in October 2014 showed an ejection fraction of 63% and normal perfusion. Last echocardiogram April 2018 showed normal LV systolic function, moderate to severe mitral regurgitation and moderate left atrial enlargement. Since last seen,  patient denies increased dyspnea, chest pain, palpitations or syncope.  Current Outpatient Medications  Medication Sig Dispense Refill  . acetaminophen (TYLENOL) 500 MG tablet Take 1,000 mg by mouth every 6 (six) hours as needed (for radiating leg pain).    [name] alfuzosin (UROXATRAL) 10 MG 24 hr tablet Take 10 mg by mouth daily.     [name] apixaban (ELIQUIS) 5 MG TABS tablet Take 1 tablet (5 mg total) by mouth 2 (two) times daily. 180 tablet 1  . atorvastatin (LIPITOR) 80 MG tablet TAKE ONE TABLET BY MOUTH DAILY 90 tablet 0  . diphenhydramine-acetaminophen (TYLENOL PM) 25-500 MG TABS tablet Take 1 tablet by mouth at bedtime as needed (for pain and sleep).    . hyoscyamine (LEVSIN SL) 0.125 MG SL tablet Place 0.125 mg under the tongue every 6 (six) hours as needed (for urinary frequency).    . pantoprazole (PROTONIX) 40 MG tablet Take 40 mg by mouth daily before breakfast.    . sodium chloride (OCEAN) 0.65 % SOLN nasal spray Place 2 sprays into both nostrils 2 (two) times daily as needed for congestion.    . metoprolol tartrate (LOPRESSOR) 50 MG tablet Take 1 tablet (50 mg total) by mouth 2 (two) times daily. 180 tablet 3   No current facility-administered medications for this visit.      Past Medical History:  Diagnosis Date  . Angina    none since  CABG  . Arthritis   . Bladder spasm    takes Levsin as needed  . CAD (coronary artery disease)   . Diverticulosis   . Duodenal ulcer    hx of  . Dysrhythmia    atrial fib post Cabg/ takes Eliquis daily as well as Metoprolol   . GERD (gastroesophageal reflux disease)    takes Protonix daily  . History of blood transfusion    not abnormal reaction  . History of colon polyps    benign  . History of kidney stones    hx of  . Hyperlipidemia    takes Atorvastatin daily  . HYPERLIPIDEMIA 05/25/2009  . Joint pain   . Nocturia   . PAF (paroxysmal atrial fibrillation) ([place])     Past Surgical History:  Procedure Laterality Date  . CARDIAC CATHETERIZATION     6/12  . COLONOSCOPY    . CORONARY ARTERY BYPASS GRAFT  03/27/2011   x3, Dr Tharon Aquas Trigt    . ESOPHAGOGASTRODUODENOSCOPY ENDOSCOPY  04/12/11   cauterization of bleeding ulcer  and artery in duodenum and stomach   . INGUINAL HERNIA REPAIR  11/06/2011   Procedure: HERNIA REPAIR INGUINAL ADULT;  Surgeon: Earnstine Regal, MD;  Location: [facility];  Service: General;  Laterality: N/A;  Repair left inguinal hernia with mesh  . WISDOM TOOTH EXTRACTION      Social History   Socioeconomic History  . Marital status: Married    Spouse name: Not  on file  . Number of children: 4  . Years of education: Not on file  . Highest education level: Not on file  Occupational History  . Occupation: retired   Scientific laboratory technician  . Financial resource strain: Not on file  . Food insecurity:    Worry: Not on file    Inability: Not on file  . Transportation needs:    Medical: Not on file    Non-medical: Not on file  Tobacco Use  . Smoking status: Former Smoker    Packs/day: 1.00    Years: 25.00    Pack years: 25.00    Last attempt to quit: 03/04/1988    Years since quitting: 29.9  . Smokeless tobacco: Never Used  Substance and Sexual Activity  . Alcohol use: Yes    Alcohol/week: 0.6 oz    Types: 1 Cans of beer per week    Comment: occ  . Drug  use: No  . Sexual activity: Not on file  Lifestyle  . Physical activity:    Days per week: Not on file    Minutes per session: Not on file  . Stress: Not on file  Relationships  . Social connections:    Talks on phone: Not on file    Gets together: Not on file    Attends religious service: Not on file    Active member of club or organization: Not on file    Attends meetings of clubs or organizations: Not on file    Relationship status: Not on file  . Intimate partner violence:    Fear of current or ex partner: Not on file    Emotionally abused: Not on file    Physically abused: Not on file    Forced sexual activity: Not on file  Other Topics Concern  . Not on file  Social History Narrative   Regular exercise- yes     Family History  Problem Relation Age of Onset  . Coronary artery disease Father   . Heart attack Father 18  . Cancer Father   . Lymphoma Unknown   . Diabetes Sister   . Heart attack Sister 41  . Ovarian cancer Sister   . Atrial fibrillation Brother   . Colon cancer Neg Hx   . Stomach cancer Neg Hx     ROS: Fatigue but no fevers or chills, productive cough, hemoptysis, dysphasia, odynophagia, melena, hematochezia, dysuria, hematuria, rash, seizure activity, orthopnea, PND, pedal edema, claudication. Remaining systems are negative.  Physical Exam: Well-developed well-nourished in no acute distress.  Skin is warm and dry.  HEENT is normal.  Neck is supple.  Chest is clear to auscultation with normal expansion.  Cardiovascular exam is irregular Abdominal exam nontender or distended. No masses palpated. Extremities show no edema. neuro grossly intact  ECG-atrial fibrillation at a rate of 72.  PVC or aberrantly conducted beat.  Personally reviewed  A/P  1 coronary artery disease-patient is doing well with no chest pain.  Continue medical therapy including statin.  He is not on aspirin given need for apixaban.  2 permanent atrial fibrillation-plan to  continue beta-blocker for rate control.  Continue apixaban.  Check hemoglobin and renal function.  3 hypertension-blood pressure is controlled.  Continue present medications.  4 hyperlipidemia-continue statin.  5 moderate to severe mitral regurgitation-plan to repeat echocardiogram.  May need to consider further intervention in the future if his symptoms worsen or he develops worsening LV function or left ventricular enlargement.  6 chronic diastolic congestive heart  failure-euvolemic.  Continue fluid restriction and low-sodium diet.  Kirk Ruths, MD

## 2018-02-20 ENCOUNTER — Ambulatory Visit: Payer: Medicare HMO | Admitting: Cardiology

## 2018-02-20 ENCOUNTER — Encounter: Payer: Self-pay | Admitting: Cardiology

## 2018-02-20 VITALS — BP 112/70 | HR 72 | Ht 67.0 in | Wt 194.6 lb

## 2018-02-20 DIAGNOSIS — I340 Nonrheumatic mitral (valve) insufficiency: Secondary | ICD-10-CM | POA: Diagnosis not present

## 2018-02-20 DIAGNOSIS — I2510 Atherosclerotic heart disease of native coronary artery without angina pectoris: Secondary | ICD-10-CM

## 2018-02-20 DIAGNOSIS — E7800 Pure hypercholesterolemia, unspecified: Secondary | ICD-10-CM

## 2018-02-20 DIAGNOSIS — I10 Essential (primary) hypertension: Secondary | ICD-10-CM

## 2018-02-20 LAB — BASIC METABOLIC PANEL
BUN/Creatinine Ratio: 19 (ref 10–24)
BUN: 15 mg/dL (ref 8–27)
CALCIUM: 9.5 mg/dL (ref 8.6–10.2)
CO2: 25 mmol/L (ref 20–29)
CREATININE: 0.77 mg/dL (ref 0.76–1.27)
Chloride: 105 mmol/L (ref 96–106)
GFR, EST AFRICAN AMERICAN: 104 mL/min/{1.73_m2} (ref >59)
GFR, EST NON AFRICAN AMERICAN: 90 mL/min/{1.73_m2} (ref >59)
Glucose: 99 mg/dL (ref 65–99)
Potassium: 4.8 mmol/L (ref 3.5–5.2)
Sodium: 142 mmol/L (ref 134–144)

## 2018-02-20 NOTE — Patient Instructions (Signed)

## 2018-02-21 DIAGNOSIS — R69 Illness, unspecified: Secondary | ICD-10-CM | POA: Diagnosis not present

## 2018-03-05 ENCOUNTER — Other Ambulatory Visit: Payer: Self-pay

## 2018-03-05 ENCOUNTER — Ambulatory Visit (HOSPITAL_COMMUNITY): Payer: Medicare HMO | Attending: Cardiovascular Disease

## 2018-03-05 DIAGNOSIS — I340 Nonrheumatic mitral (valve) insufficiency: Secondary | ICD-10-CM | POA: Insufficient documentation

## 2018-03-05 DIAGNOSIS — R3121 Asymptomatic microscopic hematuria: Secondary | ICD-10-CM | POA: Diagnosis not present

## 2018-03-05 DIAGNOSIS — I361 Nonrheumatic tricuspid (valve) insufficiency: Secondary | ICD-10-CM | POA: Diagnosis not present

## 2018-03-05 DIAGNOSIS — R972 Elevated prostate specific antigen [PSA]: Secondary | ICD-10-CM | POA: Diagnosis not present

## 2018-03-05 DIAGNOSIS — I2510 Atherosclerotic heart disease of native coronary artery without angina pectoris: Secondary | ICD-10-CM | POA: Insufficient documentation

## 2018-03-05 DIAGNOSIS — I10 Essential (primary) hypertension: Secondary | ICD-10-CM | POA: Diagnosis not present

## 2018-03-05 DIAGNOSIS — N401 Enlarged prostate with lower urinary tract symptoms: Secondary | ICD-10-CM | POA: Diagnosis not present

## 2018-03-05 DIAGNOSIS — R3915 Urgency of urination: Secondary | ICD-10-CM | POA: Diagnosis not present

## 2018-03-05 DIAGNOSIS — I4891 Unspecified atrial fibrillation: Secondary | ICD-10-CM | POA: Insufficient documentation

## 2018-03-20 DIAGNOSIS — H2513 Age-related nuclear cataract, bilateral: Secondary | ICD-10-CM | POA: Diagnosis not present

## 2018-03-20 DIAGNOSIS — H524 Presbyopia: Secondary | ICD-10-CM | POA: Diagnosis not present

## 2018-04-08 DIAGNOSIS — L821 Other seborrheic keratosis: Secondary | ICD-10-CM | POA: Diagnosis not present

## 2018-04-08 DIAGNOSIS — L812 Freckles: Secondary | ICD-10-CM | POA: Diagnosis not present

## 2018-04-08 DIAGNOSIS — D1801 Hemangioma of skin and subcutaneous tissue: Secondary | ICD-10-CM | POA: Diagnosis not present

## 2018-04-08 DIAGNOSIS — D485 Neoplasm of uncertain behavior of skin: Secondary | ICD-10-CM | POA: Diagnosis not present

## 2018-04-08 DIAGNOSIS — C44319 Basal cell carcinoma of skin of other parts of face: Secondary | ICD-10-CM | POA: Diagnosis not present

## 2018-04-08 DIAGNOSIS — L218 Other seborrheic dermatitis: Secondary | ICD-10-CM | POA: Diagnosis not present

## 2018-04-08 DIAGNOSIS — L308 Other specified dermatitis: Secondary | ICD-10-CM | POA: Diagnosis not present

## 2018-05-11 ENCOUNTER — Other Ambulatory Visit: Payer: Self-pay | Admitting: Cardiology

## 2018-05-20 ENCOUNTER — Other Ambulatory Visit: Payer: Self-pay | Admitting: Internal Medicine

## 2018-07-11 ENCOUNTER — Other Ambulatory Visit: Payer: Self-pay | Admitting: Cardiology

## 2018-07-11 NOTE — Telephone Encounter (Signed)
Rx(s) sent to pharmacy electronically.  

## 2018-07-17 ENCOUNTER — Encounter: Payer: Self-pay | Admitting: Internal Medicine

## 2018-07-17 ENCOUNTER — Ambulatory Visit (INDEPENDENT_AMBULATORY_CARE_PROVIDER_SITE_OTHER): Payer: Medicare HMO | Admitting: Internal Medicine

## 2018-07-17 ENCOUNTER — Other Ambulatory Visit (INDEPENDENT_AMBULATORY_CARE_PROVIDER_SITE_OTHER): Payer: Medicare HMO

## 2018-07-17 VITALS — BP 110/70 | HR 78 | Temp 98.4°F | Ht 67.0 in | Wt 193.0 lb

## 2018-07-17 DIAGNOSIS — N400 Enlarged prostate without lower urinary tract symptoms: Secondary | ICD-10-CM | POA: Diagnosis not present

## 2018-07-17 DIAGNOSIS — D513 Other dietary vitamin B12 deficiency anemia: Secondary | ICD-10-CM

## 2018-07-17 DIAGNOSIS — E785 Hyperlipidemia, unspecified: Secondary | ICD-10-CM

## 2018-07-17 DIAGNOSIS — R972 Elevated prostate specific antigen [PSA]: Secondary | ICD-10-CM | POA: Diagnosis not present

## 2018-07-17 DIAGNOSIS — D539 Nutritional anemia, unspecified: Secondary | ICD-10-CM

## 2018-07-17 DIAGNOSIS — F502 Bulimia nervosa, unspecified: Secondary | ICD-10-CM

## 2018-07-17 DIAGNOSIS — Z0000 Encounter for general adult medical examination without abnormal findings: Secondary | ICD-10-CM

## 2018-07-17 DIAGNOSIS — Z23 Encounter for immunization: Secondary | ICD-10-CM | POA: Diagnosis not present

## 2018-07-17 DIAGNOSIS — R69 Illness, unspecified: Secondary | ICD-10-CM | POA: Diagnosis not present

## 2018-07-17 LAB — CBC WITH DIFFERENTIAL/PLATELET
BASOS PCT: 1.1 % (ref 0.0–3.0)
Basophils Absolute: 0.1 10*3/uL (ref 0.0–0.1)
EOS ABS: 0.2 10*3/uL (ref 0.0–0.7)
EOS PCT: 2.2 % (ref 0.0–5.0)
HEMATOCRIT: 42.2 % (ref 39.0–52.0)
HEMOGLOBIN: 14.4 g/dL (ref 13.0–17.0)
LYMPHS PCT: 26.3 % (ref 12.0–46.0)
Lymphs Abs: 2.1 10*3/uL (ref 0.7–4.0)
MCHC: 34 g/dL (ref 30.0–36.0)
MCV: 94.9 fl (ref 78.0–100.0)
MONO ABS: 1 10*3/uL (ref 0.1–1.0)
Monocytes Relative: 12.6 % — ABNORMAL HIGH (ref 3.0–12.0)
Neutro Abs: 4.6 10*3/uL (ref 1.4–7.7)
Neutrophils Relative %: 57.8 % (ref 43.0–77.0)
Platelets: 217 10*3/uL (ref 150.0–400.0)
RBC: 4.45 Mil/uL (ref 4.22–5.81)
RDW: 14.7 % (ref 11.5–15.5)
WBC: 7.9 10*3/uL (ref 4.0–10.5)

## 2018-07-17 LAB — COMPREHENSIVE METABOLIC PANEL
ALBUMIN: 4.1 g/dL (ref 3.5–5.2)
ALT: 13 U/L (ref 0–53)
AST: 14 U/L (ref 0–37)
Alkaline Phosphatase: 63 U/L (ref 39–117)
BUN: 15 mg/dL (ref 6–23)
CO2: 26 meq/L (ref 19–32)
CREATININE: 0.81 mg/dL (ref 0.40–1.50)
Calcium: 9.3 mg/dL (ref 8.4–10.5)
Chloride: 106 mEq/L (ref 96–112)
GFR: 98.95 mL/min (ref >60.00)
Glucose, Bld: 93 mg/dL (ref 70–99)
POTASSIUM: 4.1 meq/L (ref 3.5–5.1)
SODIUM: 140 meq/L (ref 135–145)
TOTAL PROTEIN: 6.8 g/dL (ref 6.0–8.3)
Total Bilirubin: 0.9 mg/dL (ref 0.2–1.2)

## 2018-07-17 LAB — IBC PANEL
Iron: 61 ug/dL (ref 42–165)
SATURATION RATIOS: 17.6 % — AB (ref 20.0–50.0)
Transferrin: 247 mg/dL (ref 212.0–360.0)

## 2018-07-17 LAB — LIPID PANEL
CHOLESTEROL: 105 mg/dL (ref 0–200)
HDL: 43.3 mg/dL (ref >39.00)
LDL Cholesterol: 54 mg/dL (ref 0–99)
NONHDL: 61.87
Total CHOL/HDL Ratio: 2
Triglycerides: 41 mg/dL (ref 0.0–149.0)
VLDL: 8.2 mg/dL (ref 0.0–40.0)

## 2018-07-17 LAB — URINALYSIS, ROUTINE W REFLEX MICROSCOPIC
Bilirubin Urine: NEGATIVE
Ketones, ur: NEGATIVE
Leukocytes, UA: NEGATIVE
Nitrite: NEGATIVE
PH: 5.5 (ref 5.0–8.0)
SPECIFIC GRAVITY, URINE: 1.025 (ref 1.000–1.030)
Total Protein, Urine: NEGATIVE
Urine Glucose: NEGATIVE
Urobilinogen, UA: 0.2 (ref 0.0–1.0)

## 2018-07-17 LAB — FERRITIN: Ferritin: 27.2 ng/mL (ref 22.0–322.0)

## 2018-07-17 LAB — PSA: PSA: 6.23 ng/mL — ABNORMAL HIGH (ref 0.10–4.00)

## 2018-07-17 LAB — VITAMIN B12: VITAMIN B 12: 188 pg/mL — AB (ref 211–911)

## 2018-07-17 LAB — FOLATE: FOLATE: 13.7 ng/mL (ref >5.9)

## 2018-07-17 LAB — TSH: TSH: 0.88 u[IU]/mL (ref 0.35–4.50)

## 2018-07-17 MED ORDER — CYANOCOBALAMIN 2000 MCG PO TABS: 2000.0000 ug | ORAL_TABLET | Freq: Every day | ORAL | 1 refills | Status: DC

## 2018-07-17 MED ORDER — TOPIRAMATE 25 MG PO TABS: 25.0000 mg | ORAL_TABLET | Freq: Two times a day (BID) | ORAL | 0 refills | Status: DC

## 2018-07-17 NOTE — Patient Instructions (Signed)

## 2018-07-17 NOTE — Progress Notes (Signed)
Subjective:  Patient ID: SAID RUEB, male    DOB: 1946/10/07  Age: 71 y.o. MRN: [id number]  CC: Anemia; Hyperlipidemia; and Annual Exam   HPI Blake [name] presents for a CPX.  He complains of chronic issues with binge eating and difficulty losing weight.  He wants to do something about this.  He is active and denies any recent episodes of DOE, CP, palpitations, edema, or fatigue.  Past Medical History:  Diagnosis Date  . Angina    none since CABG  . Arthritis   . Bladder spasm    takes Levsin as needed  . CAD (coronary artery disease)   . Diverticulosis   . Duodenal ulcer    hx of  . Dysrhythmia    atrial fib post Cabg/ takes Eliquis daily as well as Metoprolol   . GERD (gastroesophageal reflux disease)    takes Protonix daily  . History of blood transfusion    not abnormal reaction  . History of colon polyps    benign  . History of kidney stones    hx of  . Hyperlipidemia    takes Atorvastatin daily  . HYPERLIPIDEMIA 05/25/2009  . Joint pain   . Nocturia   . PAF (paroxysmal atrial fibrillation) ([place])    Past Surgical History:  Procedure Laterality Date  . CARDIAC CATHETERIZATION     6/12  . COLONOSCOPY    . CORONARY ARTERY BYPASS GRAFT  03/27/2011   x3, Dr Tharon Aquas Trigt    . ESOPHAGOGASTRODUODENOSCOPY ENDOSCOPY  04/12/11   cauterization of bleeding ulcer  and artery in duodenum and stomach   . INGUINAL HERNIA REPAIR  11/06/2011   Procedure: HERNIA REPAIR INGUINAL ADULT;  Surgeon: Earnstine Regal, MD;  Location: [facility];  Service: General;  Laterality: N/A;  Repair left inguinal hernia with mesh  . WISDOM TOOTH EXTRACTION      reports that he quit smoking about 30 years ago. He has a 25.00 pack-year smoking history. He has never used smokeless tobacco. He reports that he drinks about 1.0 standard drinks of alcohol per week. He reports that he does not use drugs. family history includes Atrial fibrillation in his brother; Cancer in his father; Coronary  artery disease in his father; Diabetes in his sister; Heart attack (age of onset: 76) in his sister; Heart attack (age of onset: 28) in his father; Lymphoma in his unknown relative; Ovarian cancer in his sister. No Known Allergies  Outpatient Medications Prior to Visit  Medication Sig Dispense Refill  . acetaminophen (TYLENOL) 500 MG tablet Take 1,000 mg by mouth every 6 (six) hours as needed (for radiating leg pain).    [name] alfuzosin (UROXATRAL) 10 MG 24 hr tablet Take 10 mg by mouth daily.     [name] atorvastatin (LIPITOR) 80 MG tablet TAKE ONE TABLET BY MOUTH DAILY 90 tablet 1  . ELIQUIS 5 MG TABS tablet TAKE ONE TABLET BY MOUTH TWICE A DAY 180 tablet 1  . hyoscyamine (LEVSIN SL) 0.125 MG SL tablet Place 0.125 mg under the tongue every 6 (six) hours as needed (for urinary frequency).    . metoprolol tartrate (LOPRESSOR) 50 MG tablet Take 1 tablet (50 mg total) by mouth 2 (two) times daily. 180 tablet 2  . pantoprazole (PROTONIX) 40 MG tablet Take 40 mg by mouth daily before breakfast.    . diphenhydramine-acetaminophen (TYLENOL PM) 25-500 MG TABS tablet Take 1 tablet by mouth at bedtime as needed (for pain and sleep).    [name]  sodium chloride (OCEAN) 0.65 % SOLN nasal spray Place 2 sprays into both nostrils 2 (two) times daily as needed for congestion.     No facility-administered medications prior to visit.     ROS Review of Systems  Constitutional: Positive for unexpected weight change. Negative for chills, diaphoresis and fatigue.  HENT: Negative.   Eyes: Negative.   Respiratory: Negative for cough, chest tightness, shortness of breath and wheezing.   Cardiovascular: Negative for chest pain, palpitations and leg swelling.  Gastrointestinal: Negative for abdominal pain, constipation, diarrhea, nausea and vomiting.  Endocrine: Negative.   Genitourinary: Negative.  Negative for difficulty urinating, penile pain, penile swelling, scrotal swelling and testicular pain.  Musculoskeletal: Negative.   Negative for myalgias.  Skin: Negative.   Neurological: Negative.  Negative for dizziness, weakness and light-headedness.  Hematological: Negative for adenopathy. Does not bruise/bleed easily.  Psychiatric/Behavioral: Negative.  Negative for behavioral problems, dysphoric mood, sleep disturbance and suicidal ideas. The patient is not nervous/anxious.     Objective:  BP 110/70 (BP Location: Left Arm, Patient Position: Sitting, Cuff Size: Normal)   Pulse 78   Temp 98.4 F (36.9 C) (Oral)   Ht 5\' 7"  (1.702 m)   Wt 193 lb (87.5 kg)   SpO2 96%   BMI 30.23 kg/m   BP Readings from Last 3 Encounters:  07/17/18 110/70  02/20/18 112/70  01/28/18 98/64    Wt Readings from Last 3 Encounters:  07/17/18 193 lb (87.5 kg)  02/20/18 194 lb 9.6 oz (88.3 kg)  09/17/17 173 lb (78.5 kg)    Physical Exam  Constitutional: He is oriented to person, place, and time. No distress.  HENT:  Mouth/Throat: Oropharynx is clear and moist. No oropharyngeal exudate.  Eyes: Conjunctivae are normal. No scleral icterus.  Neck: Normal range of motion. Neck supple. No JVD present. No thyromegaly present.  Cardiovascular: Normal rate and S1 normal. An irregularly irregular rhythm present. Exam reveals no gallop.  No murmur heard. Pulmonary/Chest: Effort normal and breath sounds normal. No respiratory distress. He has no wheezes. He has no rales.  Abdominal: Soft. Bowel sounds are normal. He exhibits no mass. There is no hepatosplenomegaly. There is no tenderness. Hernia confirmed negative in the right inguinal area and confirmed negative in the left inguinal area.  Genitourinary: Rectum normal, testes normal and penis normal. Rectal exam shows no external hemorrhoid, no internal hemorrhoid, no fissure, no mass, no tenderness, anal tone normal and guaiac negative stool. Prostate is enlarged (2+ smooth symm BPH). Prostate is not tender. Right testis shows no mass, no swelling and no tenderness. Left testis shows no  mass, no swelling and no tenderness. Circumcised. No penile erythema or penile tenderness. No discharge found.  Musculoskeletal: Normal range of motion. He exhibits no edema, tenderness or deformity.  Lymphadenopathy:    He has no cervical adenopathy. No inguinal adenopathy noted on the right or left side.  Neurological: He is alert and oriented to person, place, and time.  Skin: Skin is warm and dry. No rash noted. He is not diaphoretic.  Psychiatric: He has a normal mood and affect. His behavior is normal. Judgment and thought content normal.  Vitals reviewed.   Lab Results  Component Value Date   WBC 7.9 07/17/2018   HGB 14.4 07/17/2018   HCT 42.2 07/17/2018   PLT 217.0 07/17/2018   GLUCOSE 93 07/17/2018   CHOL 105 07/17/2018   TRIG 41.0 07/17/2018   HDL 43.30 07/17/2018   LDLCALC 54 07/17/2018   ALT 13  07/17/2018   AST 14 07/17/2018   NA 140 07/17/2018   K 4.1 07/17/2018   CL 106 07/17/2018   CREATININE 0.81 07/17/2018   BUN 15 07/17/2018   CO2 26 07/17/2018   TSH 0.88 07/17/2018   PSA 6.23 (H) 07/17/2018   INR 1.05 01/29/2017   HGBA1C 5.8 11/07/2015    No results found.  Assessment & Plan:   Mecca was seen today for anemia, hyperlipidemia and annual exam.  Diagnoses and all orders for this visit:  Benign prostatic hyperplasia without lower urinary tract symptoms - His PSA is mildly elevated again.  I have asked him to let me know if he wants to see a urologist or to recheck the PSA in about 3 months.  His symptoms are adequately well controlled. -     Urinalysis, Routine w reflex microscopic; Future -     PSA; Future  Dyslipidemia, goal LDL below 70- He has achieved his LDL goal and is doing well on the statin. -     Lipid panel; Future -     TSH; Future -     Comprehensive metabolic panel; Future  Routine general medical examination at a health care facility  Deficiency anemia- His H&H are normal now.  His iron level is in the low normal range and his B12  level is low.. -     CBC with Differential/Platelet; Future -     IBC panel; Future -     Vitamin B12; Future -     Folate; Future -     Ferritin; Future -     Vitamin B1; Future  BINGE EATING DISORDER- Will start Topamax at a low dose and will gradually increase the dose based on his response and tolerability. -     topiramate (TOPAMAX) 25 MG tablet; Take 1 tablet (25 mg total) by mouth 2 (two) times daily.  Need for influenza vaccination -     Flu vaccine HIGH DOSE PF (Fluzone High dose)  Need for pneumococcal vaccination -     Pneumococcal polysaccharide vaccine 23-valent greater than or equal to 2yo subcutaneous/IM  Other dietary vitamin B12 deficiency anemia- I have asked him to start treating this with high-dose B12 oral supplementation. -     cyanocobalamin 2000 MCG tablet; Take 1 tablet (2,000 mcg total) by mouth daily.   I have discontinued Blake [name]. Marzec's diphenhydramine-acetaminophen and sodium chloride. I am also having him start on topiramate and cyanocobalamin. Additionally, I am having him maintain his alfuzosin, pantoprazole, acetaminophen, hyoscyamine, atorvastatin, ELIQUIS, and metoprolol tartrate.  Meds ordered this encounter  Medications  . topiramate (TOPAMAX) 25 MG tablet    Sig: Take 1 tablet (25 mg total) by mouth 2 (two) times daily.    Dispense:  180 tablet    Refill:  0  . cyanocobalamin 2000 MCG tablet    Sig: Take 1 tablet (2,000 mcg total) by mouth daily.    Dispense:  90 tablet    Refill:  1   See AVS for instructions about healthy living and anticipatory guidance.   Follow-up: Return in about 3 months (around 10/16/2018).  Scarlette Calico, MD

## 2018-07-18 DIAGNOSIS — R972 Elevated prostate specific antigen [PSA]: Secondary | ICD-10-CM | POA: Insufficient documentation

## 2018-07-18 NOTE — Assessment & Plan Note (Addendum)

## 2018-07-21 LAB — VITAMIN B1: VITAMIN B1 (THIAMINE): 11 nmol/L (ref 8–30)

## 2018-08-01 DIAGNOSIS — R69 Illness, unspecified: Secondary | ICD-10-CM | POA: Diagnosis not present

## 2018-08-09 IMAGING — DX DG ABDOMEN ACUTE W/ 1V CHEST
3 series · 3 of 3 positions shown · non-contrast
Comparison: Abdominal and pelvic CT scan October 18, 2015.

CLINICAL DATA: Periumbilical pain intermittently over the past 2
weeks with increasing severity.

EXAM:
DG ABDOMEN ACUTE W/ 1V CHEST

[chest pa]
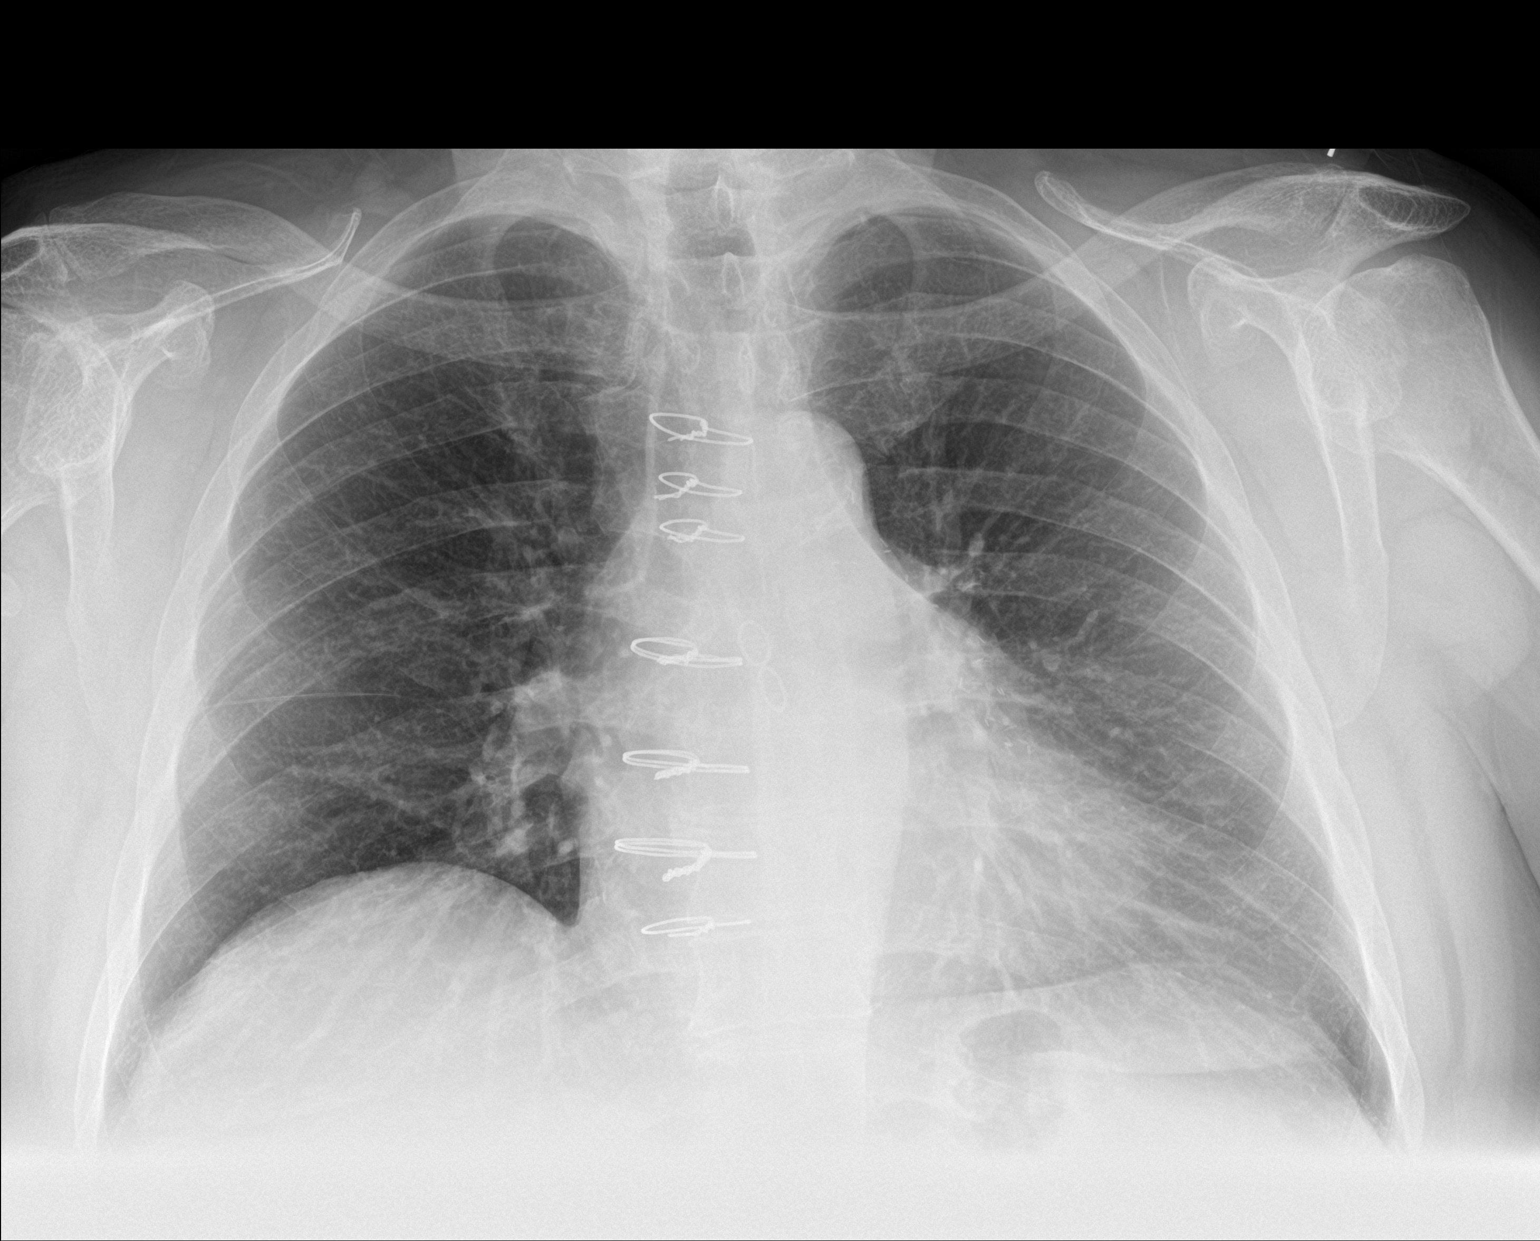

[abdomen erect]
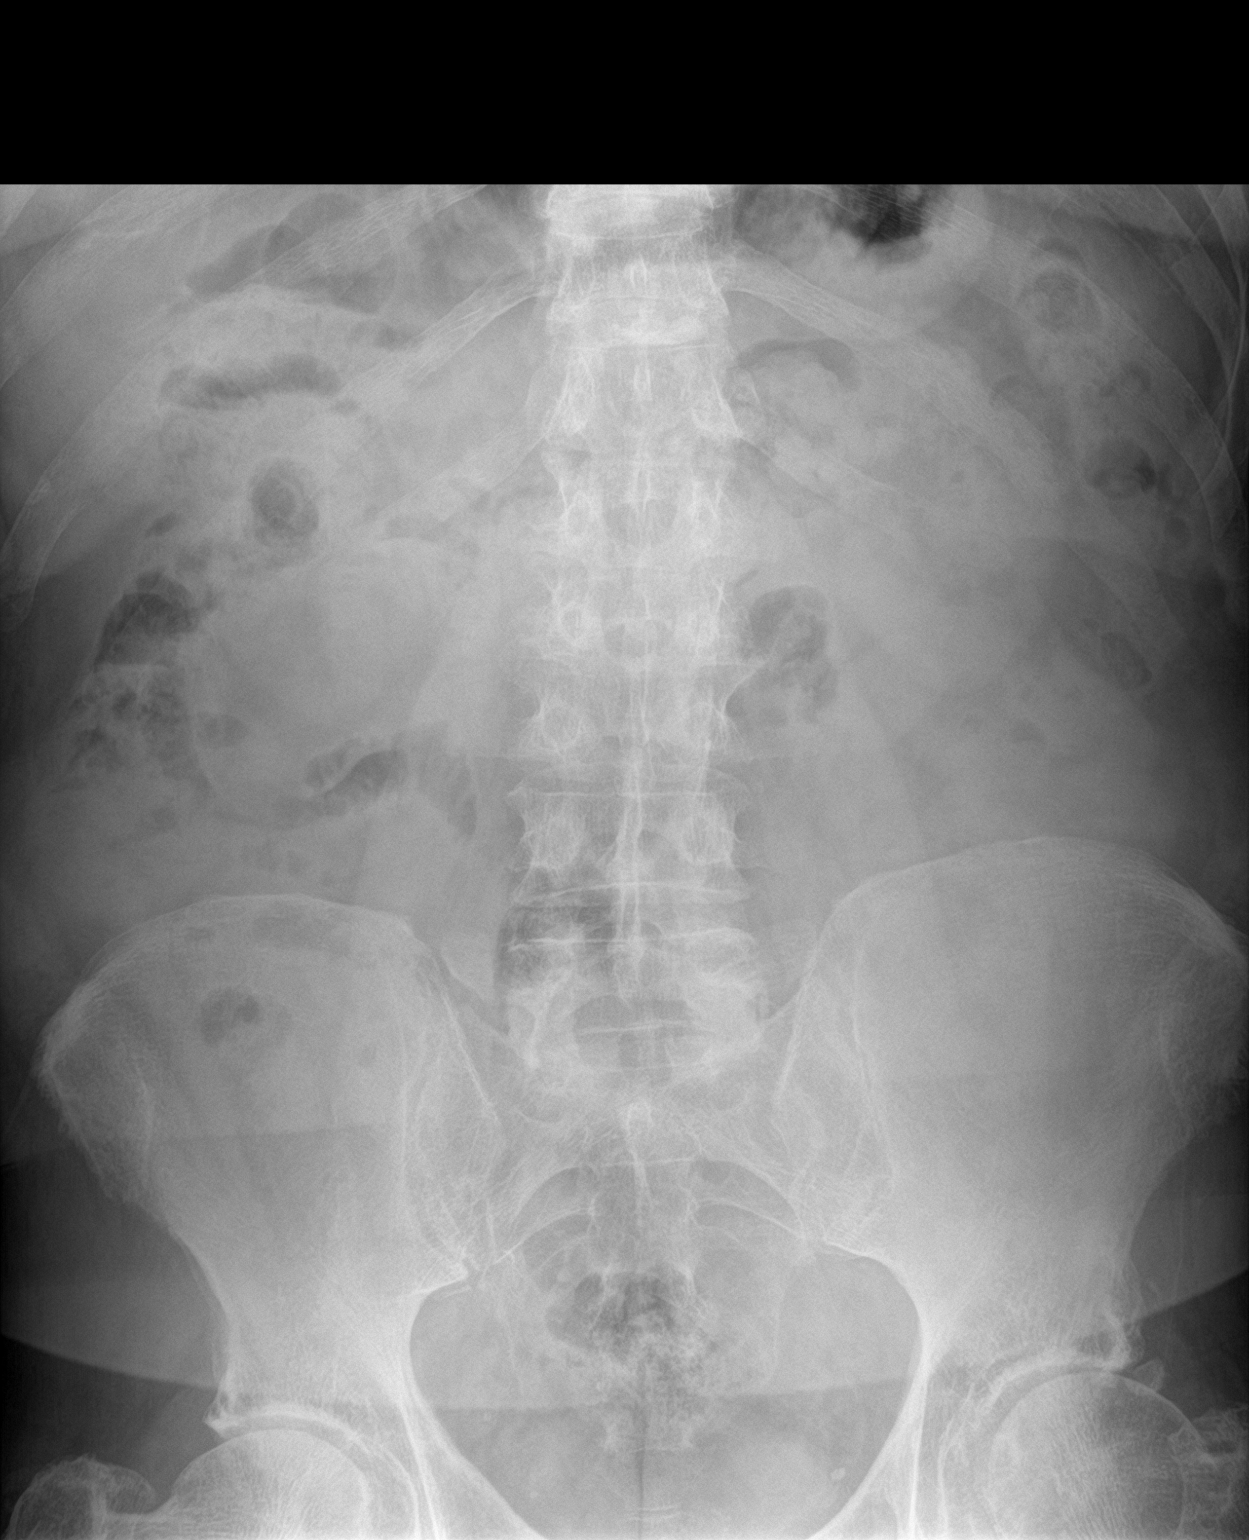

[abdomen supine]
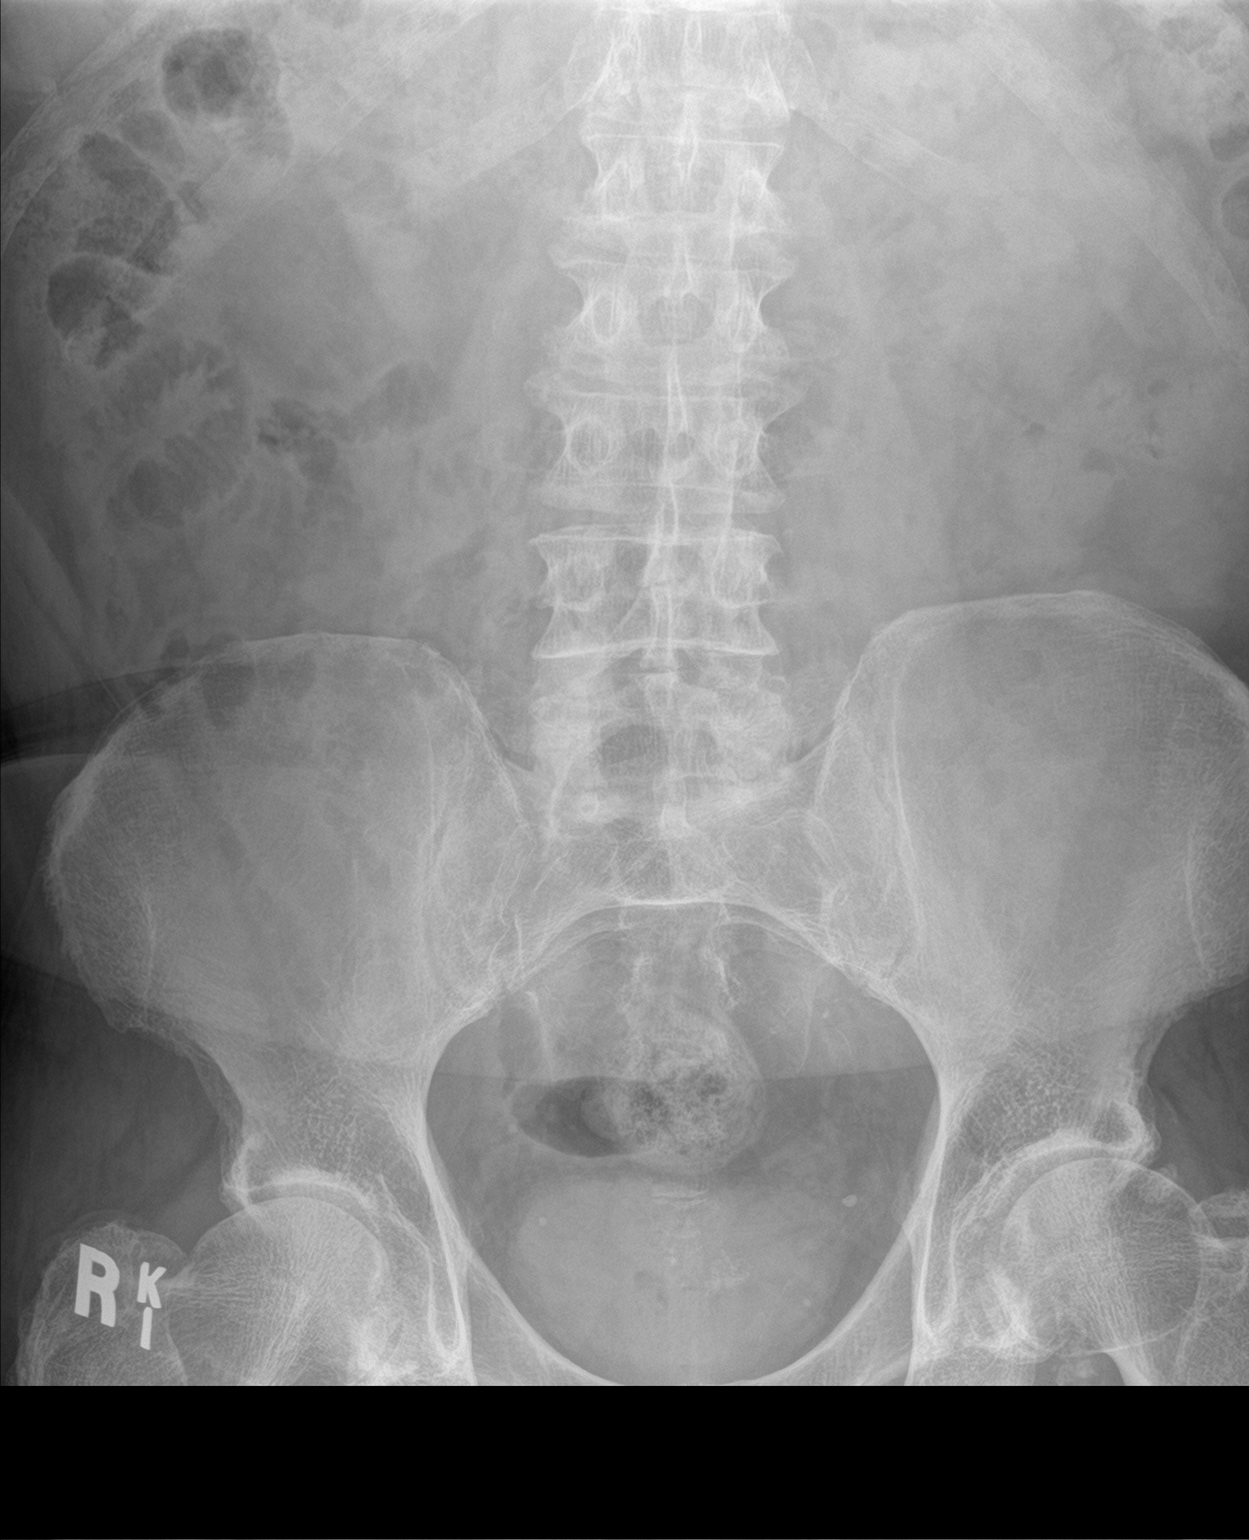

[3 of 3 positions shown; findings below may reference images not displayed]

FINDINGS: Chest x-ray: The lungs are adequately inflated and clear. The
cardiac silhouette is enlarged. Patient has undergone previous CABG.
There is calcification in the wall of the aortic arch. There is no
pleural effusion or pneumothorax.

Within the air the colonic stool burden is moderately increased.
There are small amounts of gas within nondistended small bowel
loops. The volume of gas within the colon is normal. There is stool
and gas in the rectum. There is radiodense urine within the bladder.
The bones are subjectively osteopenic. No abnormal soft tissue
calcifications are observed.
IMPRESSION: 1. No acute cardiopulmonary abnormality.  Previous CABG.
2. Aortic atherosclerosis.
3. Increased colonic stool burden consistent with constipation in
the appropriate clinical setting.

## 2018-10-02 ENCOUNTER — Encounter: Payer: Self-pay | Admitting: Internal Medicine

## 2018-10-02 ENCOUNTER — Other Ambulatory Visit: Payer: Self-pay | Admitting: Internal Medicine

## 2018-10-02 DIAGNOSIS — F502 Bulimia nervosa: Secondary | ICD-10-CM

## 2018-10-02 MED ORDER — TOPIRAMATE 100 MG PO TABS: 100.0000 mg | ORAL_TABLET | Freq: Every day | ORAL | 0 refills | Status: DC

## 2018-10-08 DIAGNOSIS — R69 Illness, unspecified: Secondary | ICD-10-CM | POA: Diagnosis not present

## 2018-10-12 ENCOUNTER — Other Ambulatory Visit: Payer: Self-pay | Admitting: Internal Medicine

## 2018-10-12 DIAGNOSIS — F502 Bulimia nervosa: Secondary | ICD-10-CM

## 2018-10-23 ENCOUNTER — Ambulatory Visit (INDEPENDENT_AMBULATORY_CARE_PROVIDER_SITE_OTHER): Payer: Medicare HMO | Admitting: Internal Medicine

## 2018-10-23 ENCOUNTER — Other Ambulatory Visit (INDEPENDENT_AMBULATORY_CARE_PROVIDER_SITE_OTHER): Payer: Medicare HMO

## 2018-10-23 ENCOUNTER — Encounter: Payer: Self-pay | Admitting: Internal Medicine

## 2018-10-23 VITALS — BP 112/72 | HR 83 | Temp 98.0°F | Ht 67.0 in | Wt 178.8 lb

## 2018-10-23 DIAGNOSIS — E785 Hyperlipidemia, unspecified: Secondary | ICD-10-CM

## 2018-10-23 DIAGNOSIS — N3281 Overactive bladder: Secondary | ICD-10-CM | POA: Diagnosis not present

## 2018-10-23 DIAGNOSIS — R972 Elevated prostate specific antigen [PSA]: Secondary | ICD-10-CM | POA: Diagnosis not present

## 2018-10-23 DIAGNOSIS — K635 Polyp of colon: Secondary | ICD-10-CM | POA: Insufficient documentation

## 2018-10-23 DIAGNOSIS — R69 Illness, unspecified: Secondary | ICD-10-CM | POA: Diagnosis not present

## 2018-10-23 DIAGNOSIS — Z1159 Encounter for screening for other viral diseases: Secondary | ICD-10-CM

## 2018-10-23 DIAGNOSIS — F502 Bulimia nervosa: Secondary | ICD-10-CM

## 2018-10-23 DIAGNOSIS — H9193 Unspecified hearing loss, bilateral: Secondary | ICD-10-CM | POA: Diagnosis not present

## 2018-10-23 LAB — COMPREHENSIVE METABOLIC PANEL
ALK PHOS: 66 U/L (ref 39–117)
ALT: 9 U/L (ref 0–53)
AST: 11 U/L (ref 0–37)
Albumin: 3.9 g/dL (ref 3.5–5.2)
BILIRUBIN TOTAL: 0.6 mg/dL (ref 0.2–1.2)
BUN: 17 mg/dL (ref 6–23)
CALCIUM: 8.7 mg/dL (ref 8.4–10.5)
CO2: 22 mEq/L (ref 19–32)
Chloride: 112 mEq/L (ref 96–112)
Creatinine, Ser: 0.88 mg/dL (ref 0.40–1.50)
GFR: 89.86 mL/min (ref >60.00)
Glucose, Bld: 96 mg/dL (ref 70–99)
POTASSIUM: 4.2 meq/L (ref 3.5–5.1)
Sodium: 139 mEq/L (ref 135–145)
Total Protein: 6.8 g/dL (ref 6.0–8.3)

## 2018-10-23 LAB — PSA: PSA: 5.3

## 2018-10-23 MED ORDER — TOPIRAMATE 100 MG PO TABS: 100.0000 mg | ORAL_TABLET | Freq: Two times a day (BID) | ORAL | 0 refills | Status: DC

## 2018-10-23 NOTE — Progress Notes (Signed)
Subjective:  Patient ID: Blake [name], male    DOB: 1946-04-25  Age: 73 y.o. MRN: [id number]  CC: Atrial Fibrillation   HPI Blake [name] presents for f/up - He wants to try a higher dose of Topamax to see if that will help with his binge eating disorder.  He has had a hard time recently controlling his eating and he has not been able to lose weight over the last few months.  Outpatient Medications Prior to Visit  Medication Sig Dispense Refill  . atorvastatin (LIPITOR) 80 MG tablet TAKE ONE TABLET BY MOUTH DAILY 90 tablet 1  . cyanocobalamin 2000 MCG tablet Take 1 tablet (2,000 mcg total) by mouth daily. 90 tablet 1  . ELIQUIS 5 MG TABS tablet TAKE ONE TABLET BY MOUTH TWICE A DAY 180 tablet 1  . hyoscyamine (LEVSIN SL) 0.125 MG SL tablet Place 0.125 mg under the tongue every 6 (six) hours as needed (for urinary frequency).    . metoprolol tartrate (LOPRESSOR) 50 MG tablet Take 1 tablet (50 mg total) by mouth 2 (two) times daily. 180 tablet 2  . pantoprazole (PROTONIX) 40 MG tablet Take 40 mg by mouth daily before breakfast.    . acetaminophen (TYLENOL) 500 MG tablet Take 1,000 mg by mouth every 6 (six) hours as needed (for radiating leg pain).    [name] alfuzosin (UROXATRAL) 10 MG 24 hr tablet Take 10 mg by mouth daily.     [name] topiramate (TOPAMAX) 100 MG tablet Take 1 tablet (100 mg total) by mouth daily. 90 tablet 0   No facility-administered medications prior to visit.     ROS Review of Systems  Constitutional: Negative for diaphoresis, fatigue and unexpected weight change.  HENT: Positive for hearing loss. Negative for trouble swallowing.   Respiratory: Negative for cough, chest tightness, shortness of breath and wheezing.   Cardiovascular: Negative for chest pain, palpitations and leg swelling.  Gastrointestinal: Negative for abdominal pain, constipation, diarrhea, nausea and vomiting.  Endocrine: Negative.   Genitourinary: Positive for frequency and urgency. Negative for  decreased urine volume and difficulty urinating.       + urge incontinence  Musculoskeletal: Negative.  Negative for arthralgias and myalgias.  Skin: Negative.  Negative for color change and pallor.  Neurological: Negative.  Negative for dizziness, weakness, light-headedness and numbness.  Hematological: Negative for adenopathy. Does not bruise/bleed easily.  Psychiatric/Behavioral: Negative.     Objective:  BP 112/72 (BP Location: Left Arm, Patient Position: Sitting, Cuff Size: Normal)   Pulse 83   Temp 98 F (36.7 C) (Oral)   Ht 5\' 7"  (1.702 m)   Wt 178 lb 12 oz (81.1 kg)   SpO2 97%   BMI 28.00 kg/m   BP Readings from Last 3 Encounters:  10/23/18 112/72  07/17/18 110/70  02/20/18 112/70    Wt Readings from Last 3 Encounters:  10/23/18 178 lb 12 oz (81.1 kg)  07/17/18 193 lb (87.5 kg)  02/20/18 194 lb 9.6 oz (88.3 kg)    Physical Exam Vitals signs reviewed.  Constitutional:      Appearance: Normal appearance.  HENT:     Right Ear: Tympanic membrane, ear canal and external ear normal. Decreased hearing noted.     Left Ear: Tympanic membrane, ear canal and external ear normal. Decreased hearing noted.     Nose: Nose normal. No congestion.     Mouth/Throat:     Mouth: Mucous membranes are moist.     Pharynx: No posterior oropharyngeal erythema.  Eyes:     Conjunctiva/sclera: Conjunctivae normal.  Neck:     Musculoskeletal: Normal range of motion and neck supple. No muscular tenderness.  Cardiovascular:     Rate and Rhythm: Normal rate. Rhythm irregularly irregular.     Heart sounds: No murmur. No gallop.   Pulmonary:     Effort: Pulmonary effort is normal.     Breath sounds: Normal breath sounds. No stridor. No wheezing, rhonchi or rales.  Abdominal:     General: Bowel sounds are normal.     Palpations: There is no mass.     Tenderness: There is no abdominal tenderness. There is no guarding.     Hernia: No hernia is present.  Musculoskeletal: Normal range of  motion.        General: No tenderness or deformity.     Right lower leg: No edema.     Left lower leg: No edema.  Skin:    General: Skin is warm and dry.  Neurological:     General: No focal deficit present.     Mental Status: He is alert and oriented to person, place, and time. Mental status is at baseline.     Lab Results  Component Value Date   WBC 7.9 07/17/2018   HGB 14.4 07/17/2018   HCT 42.2 07/17/2018   PLT 217.0 07/17/2018   GLUCOSE 96 10/23/2018   CHOL 105 07/17/2018   TRIG 41.0 07/17/2018   HDL 43.30 07/17/2018   LDLCALC 54 07/17/2018   ALT 9 10/23/2018   AST 11 10/23/2018   NA 139 10/23/2018   K 4.2 10/23/2018   CL 112 10/23/2018   CREATININE 0.88 10/23/2018   BUN 17 10/23/2018   CO2 22 10/23/2018   TSH 0.88 07/17/2018   PSA 6.23 (H) 07/17/2018   INR 1.05 01/29/2017   HGBA1C 5.8 11/07/2015    No results found.  Assessment & Plan:   Basil was seen today for atrial fibrillation.  Diagnoses and all orders for this visit:  PSA elevation- I will recheck his PSA.  If it continues to climb then I will ask him to see urology to be evaluated for prostate cancer. -     PSA, total and free; Future  BINGE EATING DISORDER- His electrolytes and renal function are normal.  I will increase the dose of topiramate in an effort to gain better control of his binge eating disorder. -     Comprehensive metabolic panel; Future -     topiramate (TOPAMAX) 100 MG tablet; Take 1 tablet (100 mg total) by mouth 2 (two) times daily.  Dyslipidemia, goal LDL below 70- He has achieved his LDL goal and is doing well on the statin. -     Comprehensive metabolic panel; Future  Need for hepatitis C screening test -     Hepatitis C antibody; Future  Bilateral hearing loss, unspecified hearing loss type -     Ambulatory referral to Audiology  Polyp of colon, unspecified part of colon, unspecified type -     Ambulatory referral to Gastroenterology  OAB (overactive bladder) -      Ambulatory referral to Urology   I have discontinued Isa Rankin. Chaudhari's alfuzosin and acetaminophen. I have also changed his topiramate. Additionally, I am having him maintain his pantoprazole, hyoscyamine, atorvastatin, ELIQUIS, metoprolol tartrate, and cyanocobalamin.  Meds ordered this encounter  Medications  . topiramate (TOPAMAX) 100 MG tablet    Sig: Take 1 tablet (100 mg total) by mouth 2 (two) times daily.  Dispense:  180 tablet    Refill:  0     Follow-up: Return in about 6 months (around 04/23/2019).  Scarlette Calico, MD

## 2018-10-23 NOTE — Patient Instructions (Signed)
Binge-Eating Disorder Binge-eating disorder is a problem that involves repeated episodes of binge-eating. Binge-eating refers to eating a larger-than-normal amount of food in a short period of time, usually within 2 hours. People with this condition may eat even when they are not hungry, and they do not stop eating even when they feel full. People with binge-eating disorder feel unable to control their eating. Although they feel bad about overeating, they usually do not try to undo the bingeing by using laxatives or making themselves vomit. They do not starve themselves or exercise too much. Binge-eating disorder usually starts in the teenage years or early 20s. It often gets worse with stress. What are the causes? The cause of this condition is not known. What increases the risk? The following factors may make you more likely to develop this condition:  Being a teenager or in your early 20s.  Being male. Binge-eating disorder can affect males, but it is more common in females.  Being overweight or obese.  Having a mental health disorder, such as depression or anxiety.  Having a substance use disorder, such as alcohol use disorder.  Having a history of unhealthy dieting, such as meal skipping, yo-yo dieting, food restricting, or avoiding certain kinds of foods. What are the signs or symptoms? Symptoms of this condition include:  Eating much more quickly than normal.  Eating to the point of feeling physically uncomfortable.  Eating large amounts of food when you are not hungry.  Eating alone because you are embarrassed about how much you are eating.  Feeling disgusted, depressed, or guilty after overeating. How is this diagnosed? This condition is diagnosed through an assessment by your health care provider. You may be diagnosed with the disorder if you:  Binge-eat an average of one or more times a week for three months or longer.  Have three or more of the symptoms of the  disorder. Once you have been diagnosed, your level of binge-eating disorder will be rated from mild to severe. The rating is based on how often you binge-eat. How is this treated? This condition may be treated with:  Cognitive behavioral therapy (CBT). This is a form of talk therapy that helps you recognize the thoughts, beliefs, and emotions that contribute to overeating. It also helps you change them.  Interpersonal psychotherapy. This is a form of talk therapy that focuses on fixing relationship problems that trigger binge-eating episodes.  Dialectical behavioral therapy (DBT). This is a form of talk therapy that helps you learn skills to control your emotions and tolerate distress without binge-eating.  Medicine.  Weight-loss programs. These can be important if you are overweight. Losing excess weight can improve your physical health and the way you feel about yourself. Treatment is usually provided by mental health professionals, such as psychologists, psychiatrists, licensed professional counselors, and clinical social workers. Follow these instructions at home: Lifestyle      Eat a healthy diet that consists of lean meats and low-fat dairy products, as well as foods that are high in fiber, such as fresh fruits and vegetables, whole grains, and beans.  Work to develop a healthy relationship with food. Talk with your health care provider or a nutrition specialist (dietitian). He or she can provide guidance about healthy eating and healthy lifestyle choices.  Start an exercise routine and stay active. Aim for 30 or more minutes of exercise a day on 5 or more days a week to keep your body strong and healthy. You may need to exercise more if   you want to lose weight. Talk with your health care provider about how much and what type of exercise you can do. Some ways to be active include: ? Playing sports. ? Biking. ? Skating or skateboarding. ? Dancing. ? Running, walking, jogging, or  hiking. ? Doing yard work. General instructions  Take over-the-counter and prescription medicines only as told by your health care provider.  Drink enough fluid to keep your urine pale yellow.  Keep all follow-up visits as told by your health care provider. This is important. Where to find more information National Eating Disorders Association (NEDA): www.nationaleatingdisorders.org Contact a health care provider if:  Your symptoms get worse.  You start having new symptoms.  You start compensating for eating binges with harmful behaviors, such as: ? Making yourself vomit. ? Exercising too much. ? Using laxatives. Get help right away if:  You have serious thoughts about hurting yourself or someone else. If you ever feel like you may hurt yourself or others, or have thoughts about taking your own life, get help right away. You can go to your nearest emergency department or call:  Your local emergency services (911 in the [place]  A suicide crisis helpline, such as the [place] at 602 659 6894. This is open 24 hours a day. Summary  You may have binge-eating disorder if you have feelings of guilt from overeating, eat to the point of feeling uncomfortable, eat a large amount of food in a short time, or find yourself eating when you are not hungry. Seek help from your health care provider.  The exact cause of a binge-eating disorder is not known. There are some risk factors for this disease, such as having a mental health disorder and having a history of unhealthy dieting.  There are a variety of treatment options such as counseling therapy, medicines, and learning healthy ways to lose or maintain your weight. These can help you overcome your binge-eating disorder. This information is not intended to replace advice given to you by your health care provider. Make sure you discuss any questions you have with your health care provider. Document Released:  02/28/2011 Document Revised: 07/08/2017 Document Reviewed: 07/08/2017 Elsevier Interactive Patient Education  2019 [employer].

## 2018-10-24 ENCOUNTER — Other Ambulatory Visit: Payer: Self-pay | Admitting: Cardiology

## 2018-10-24 LAB — PSA, TOTAL AND FREE
PSA, % Free: 23 % (calc) — ABNORMAL LOW (ref >25)
PSA, Free: 1.2 ng/mL
PSA, Total: 5.3 ng/mL — ABNORMAL HIGH (ref <=4.0)

## 2018-10-24 LAB — HEPATITIS C ANTIBODY
Hepatitis C Ab: NONREACTIVE
SIGNAL TO CUT-OFF: 0.03 (ref <1.00)

## 2018-10-26 ENCOUNTER — Other Ambulatory Visit: Payer: Self-pay | Admitting: Internal Medicine

## 2018-10-26 ENCOUNTER — Encounter: Payer: Self-pay | Admitting: Internal Medicine

## 2018-10-27 DIAGNOSIS — L820 Inflamed seborrheic keratosis: Secondary | ICD-10-CM | POA: Diagnosis not present

## 2018-10-27 DIAGNOSIS — Z85828 Personal history of other malignant neoplasm of skin: Secondary | ICD-10-CM | POA: Diagnosis not present

## 2018-10-27 DIAGNOSIS — L218 Other seborrheic dermatitis: Secondary | ICD-10-CM | POA: Diagnosis not present

## 2018-11-04 ENCOUNTER — Other Ambulatory Visit: Payer: Self-pay | Admitting: Cardiology

## 2018-11-04 NOTE — Telephone Encounter (Signed)
Atorvastatin refilled #[id number] with NO refills on 10/23/2018

## 2018-11-12 ENCOUNTER — Telehealth: Payer: Self-pay | Admitting: Emergency Medicine

## 2018-11-12 ENCOUNTER — Encounter: Payer: Self-pay | Admitting: Internal Medicine

## 2018-11-12 ENCOUNTER — Encounter: Payer: Self-pay | Admitting: Physician Assistant

## 2018-11-12 ENCOUNTER — Ambulatory Visit: Payer: Medicare HMO | Admitting: Physician Assistant

## 2018-11-12 VITALS — BP 110/72 | HR 82 | Ht 67.0 in | Wt 180.0 lb

## 2018-11-12 DIAGNOSIS — Z7901 Long term (current) use of anticoagulants: Secondary | ICD-10-CM

## 2018-11-12 DIAGNOSIS — Z01818 Encounter for other preprocedural examination: Secondary | ICD-10-CM | POA: Diagnosis not present

## 2018-11-12 DIAGNOSIS — Z86010 Personal history of colonic polyps: Secondary | ICD-10-CM

## 2018-11-12 MED ORDER — NA SULFATE-K SULFATE-MG SULF 17.5-3.13-1.6 GM/177ML PO SOLN: 1.0000 | ORAL | 0 refills | Status: DC

## 2018-11-12 NOTE — Progress Notes (Signed)
Chief Complaint: History of tubular adenomas, chronic anticoagulation  HPI:    Blake [name] is a 73 year old Caucasian male, known to Dr. Henrene Pastor, with a past medical history as listed below including CAD with A. fib post CABG on Eliquis (03/05/2018 echo with LVEF 55-60%), who was referred to me by Janith Lima, MD for a preprocedural visit for a colonoscopy in a patient on chronic anticoagulation.      Colonoscopy 10/30/2012 for personal history of adenomatous colon polyps (2002 multiple and TVA), 2003, 2004, 2008, with 2 polyps ranging between 3-5 mm in size in the ascending and transverse colon, moderate diverticulosis in the sigmoid colon otherwise normal.  Pathology showed tubular adenomas repeat recommended in 5 years.    Today, the patient tells me that he does occasionally have some abdominal bloating which feels like "I have a basketball in there".  Saw his PCP for this a couple of years ago and was told to take MiraLAX twice a day for a month at a time or as needed for this feeling.  If the patient does this , this feeling goes away.  He is just wondering today if there is anything else we need to look into or if that is the explanation that there is built up stool.    Denies fever, chills, weight loss, abdominal pain, anorexia, nausea, vomiting, heartburn, reflux or blood in his stool.  Past Medical History:  Diagnosis Date  . Angina    none since CABG  . Arthritis   . Bladder spasm    takes Levsin as needed  . CAD (coronary artery disease)   . Diverticulosis   . Duodenal ulcer    hx of  . Dysrhythmia    atrial fib post Cabg/ takes Eliquis daily as well as Metoprolol   . GERD (gastroesophageal reflux disease)    takes Protonix daily  . History of blood transfusion    not abnormal reaction  . History of colon polyps    benign  . History of kidney stones    hx of  . Hyperlipidemia    takes Atorvastatin daily  . HYPERLIPIDEMIA 05/25/2009  . Joint pain   . Nocturia   . PAF  (paroxysmal atrial fibrillation) ([place])     Past Surgical History:  Procedure Laterality Date  . CARDIAC CATHETERIZATION     6/12  . COLONOSCOPY    . CORONARY ARTERY BYPASS GRAFT  03/27/2011   x3, Dr Tharon Aquas Trigt    . ESOPHAGOGASTRODUODENOSCOPY ENDOSCOPY  04/12/11   cauterization of bleeding ulcer  and artery in duodenum and stomach   . INGUINAL HERNIA REPAIR  11/06/2011   Procedure: HERNIA REPAIR INGUINAL ADULT;  Surgeon: Earnstine Regal, MD;  Location: [facility];  Service: General;  Laterality: N/A;  Repair left inguinal hernia with mesh  . WISDOM TOOTH EXTRACTION      Current Outpatient Medications  Medication Sig Dispense Refill  . apixaban (ELIQUIS) 5 MG TABS tablet Take 1 tablet (5 mg total) by mouth 2 (two) times daily. 180 tablet 0  . atorvastatin (LIPITOR) 80 MG tablet TAKE ONE TABLET BY MOUTH DAILY 90 tablet 0  . cyanocobalamin 2000 MCG tablet Take 1 tablet (2,000 mcg total) by mouth daily. 90 tablet 1  . hyoscyamine (LEVSIN SL) 0.125 MG SL tablet Place 0.125 mg under the tongue every 6 (six) hours as needed (for urinary frequency).    . metoprolol tartrate (LOPRESSOR) 50 MG tablet Take 1 tablet (50 mg total) by mouth  2 (two) times daily. 180 tablet 2  . pantoprazole (PROTONIX) 40 MG tablet Take 40 mg by mouth daily before breakfast.    . topiramate (TOPAMAX) 100 MG tablet Take 1 tablet (100 mg total) by mouth 2 (two) times daily. 180 tablet 0   No current facility-administered medications for this visit.     Allergies as of 11/12/2018  . (No Known Allergies)    Family History  Problem Relation Age of Onset  . Coronary artery disease Father   . Heart attack Father 63  . Cancer Father   . Lymphoma Unknown   . Diabetes Sister   . Heart attack Sister 11  . Ovarian cancer Sister   . Atrial fibrillation Brother   . Colon cancer Neg Hx   . Stomach cancer Neg Hx     Social History   Socioeconomic History  . Marital status: Married    Spouse name: Not on file  .  Number of children: 4  . Years of education: Not on file  . Highest education level: Not on file  Occupational History  . Occupation: retired   Scientific laboratory technician  . Financial resource strain: Not on file  . Food insecurity:    Worry: Not on file    Inability: Not on file  . Transportation needs:    Medical: Not on file    Non-medical: Not on file  Tobacco Use  . Smoking status: Former Smoker    Packs/day: 1.00    Years: 25.00    Pack years: 25.00    Last attempt to quit: 03/04/1988    Years since quitting: 30.7  . Smokeless tobacco: Never Used  Substance and Sexual Activity  . Alcohol use: Yes    Alcohol/week: 1.0 standard drinks    Types: 1 Cans of beer per week    Comment: occ  . Drug use: No  . Sexual activity: Not on file  Lifestyle  . Physical activity:    Days per week: Not on file    Minutes per session: Not on file  . Stress: Not on file  Relationships  . Social connections:    Talks on phone: Not on file    Gets together: Not on file    Attends religious service: Not on file    Active member of club or organization: Not on file    Attends meetings of clubs or organizations: Not on file    Relationship status: Not on file  . Intimate partner violence:    Fear of current or ex partner: Not on file    Emotionally abused: Not on file    Physically abused: Not on file    Forced sexual activity: Not on file  Other Topics Concern  . Not on file  Social History Narrative   Regular exercise- yes     Review of Systems:    Constitutional: No weight loss, fever or chills Skin: No rash  Cardiovascular: No chest pain Respiratory: No SOB Gastrointestinal: See HPI and otherwise negative Genitourinary: No dysuria  Neurological: No headache, dizziness or syncope Musculoskeletal: No new muscle or joint pain Hematologic: No bleeding  Psychiatric: No history of depression or anxiety   Physical Exam:  Vital signs: BP 110/72   Pulse 82   Ht 5\' 7"  (1.702 m)   Wt 180 lb  (81.6 kg)   BMI 28.19 kg/m    Constitutional:   Pleasant Elderly Caucasian male appears to be in NAD, Well developed, Well nourished, alert and cooperative  Head:  Normocephalic and atraumatic. Eyes:   PEERL, EOMI. No icterus. Conjunctiva pink. Ears:  Normal auditory acuity. Neck:  Supple Throat: Oral cavity and pharynx without inflammation, swelling or lesion.  Respiratory: Respirations even and unlabored. Lungs clear to auscultation bilaterally.   No wheezes, crackles, or rhonchi.  Cardiovascular: Normal S1, S2. No MRG. Regular rate and rhythm. No peripheral edema, cyanosis or pallor.  Gastrointestinal:  Soft, nondistended, nontender. No rebound or guarding. Normal bowel sounds. No appreciable masses or hepatomegaly. Rectal:  Not performed.  Msk:  Symmetrical without gross deformities. Without edema, no deformity or joint abnormality.  Neurologic:  Alert and  oriented x4;  grossly normal neurologically.  Skin:   Dry and intact without significant lesions or rashes. Psychiatric: Demonstrates good judgement and reason without abnormal affect or behaviors.  MOST RECENT LABS AND IMAGING: CBC    Component Value Date/Time   WBC 7.9 07/17/2018 0951   RBC 4.45 07/17/2018 0951   HGB 14.4 07/17/2018 0951   HGB 14.2 02/15/2017 1435   HCT 42.2 07/17/2018 0951   HCT 40.7 02/15/2017 1435   PLT 217.0 07/17/2018 0951   PLT 369 02/15/2017 1435   MCV 94.9 07/17/2018 0951   MCV 89 02/15/2017 1435   MCH 31.3 09/18/2017 0547   MCHC 34.0 07/17/2018 0951   RDW 14.7 07/17/2018 0951   RDW 13.4 02/15/2017 1435   LYMPHSABS 2.1 07/17/2018 0951   MONOABS 1.0 07/17/2018 0951   EOSABS 0.2 07/17/2018 0951   BASOSABS 0.1 07/17/2018 0951    CMP     Component Value Date/Time   NA 139 10/23/2018 1026   NA 142 02/20/2018 0903   K 4.2 10/23/2018 1026   CL 112 10/23/2018 1026   CO2 22 10/23/2018 1026   GLUCOSE 96 10/23/2018 1026   BUN 17 10/23/2018 1026   BUN 15 02/20/2018 0903   CREATININE 0.88  10/23/2018 1026   CREATININE 0.92 02/11/2017 1609   CALCIUM 8.7 10/23/2018 1026   PROT 6.8 10/23/2018 1026   PROT 7.2 02/15/2017 1435   ALBUMIN 3.9 10/23/2018 1026   ALBUMIN 4.3 02/15/2017 1435   AST 11 10/23/2018 1026   ALT 9 10/23/2018 1026   ALKPHOS 66 10/23/2018 1026   BILITOT 0.6 10/23/2018 1026   BILITOT 1.8 (H) 02/15/2017 1435   GFRNONAA 90 02/20/2018 0903   GFRNONAA >89 06/10/2014 0913   GFRAA 104 02/20/2018 0903   GFRAA >89 06/10/2014 0913    Assessment: 1.  History of tubular adenomas: Last colon in 2014 with recommendations to repeat in 5 years 2.  Chronic anticoagulation: For CAD and A. fib on Eliquis  Plan: 1.  Scheduled patient for a surveillance colonoscopy due to history of tubular adenomas with Dr. Henrene Pastor in the [facility].  Did discuss risks, benefits, limitations and alternatives and the patient agrees to proceed. 2.  Patient was advised to hold his Eliquis for 2 days prior to time of procedure.  We will communicate with his prescribing physician to ensure that holding his Eliquis is acceptable for him. 3.  Discussed that as long as MiraLAX is working to help with his bloating then would continue this. 4.  Patient to follow in clinic per recommendations from Dr. Henrene Pastor after time of procedure.  Ellouise Newer, PA-C [facility] 11/12/2018, 9:51 AM  Cc: Janith Lima, MD

## 2018-11-12 NOTE — Telephone Encounter (Signed)
Patient with diagnosis of atrial fibrillation on Eliquis for anticoagulation.    Procedure: colonoscopy Date of procedure: 11/19/2018  CHADS2-VASc score of  3 (CHF,, AGE, , CAD, )  CrCl 85 Platelet count 217  Per office protocol, patient can hold Eliquis for 1 days prior to procedure.    Patient should restart Eliquis on the evening of procedure or day after, at discretion of procedure MD

## 2018-11-12 NOTE — Telephone Encounter (Signed)
   Primary Cardiologist: Kirk Ruths, MD  Chart reviewed as part of pre-operative protocol coverage. Patient was contacted 11/12/2018 in reference to pre-operative risk assessment for pending surgery as outlined below.  Blake [name] was last seen on 02/20/2018 by Dr. Stanford Breed.  Since that day, Blake [name] has done well without any cardiac symptoms. He denies CP, dyspnea and palpitations.   Therefore, based on ACC/AHA guidelines, the patient would be at acceptable risk for the planned procedure without further cardiovascular testing.   Pharmacy to weigh in on Eliquis.   Please call with questions.  Lyda Jester, PA-C 11/12/2018, 3:35 PM

## 2018-11-12 NOTE — Telephone Encounter (Signed)
 [facility] Pre-operative Risk Assessment     Request for surgical clearance:     Endoscopy Procedure  What type of surgery is being performed?     colonoscopy  When is this surgery scheduled?     11-19-18  What type of clearance is required ?   Pharmacy  Are there any medications that need to be held prior to surgery and how long? Eliquis  Practice name and name of physician performing surgery?      [facility]  What is your office phone and fax number?       Phone- [phone]  [phone]  Anesthesia type (None, local, MAC, general) ?       MAC

## 2018-11-12 NOTE — Patient Instructions (Signed)

## 2018-11-12 NOTE — Progress Notes (Signed)
Assessment and plan reviewed 

## 2018-11-13 ENCOUNTER — Telehealth: Payer: Self-pay | Admitting: Physician Assistant

## 2018-11-13 NOTE — Telephone Encounter (Signed)
Spoke with patient he is aware he will need to hold his Eliquis one day before his procedure as well as the day of his procedure. He will resume it per discharge instructions.

## 2018-11-13 NOTE — Telephone Encounter (Signed)
Spoke to pharmacist and informed her this was an error. She will correct the prescription

## 2018-11-13 NOTE — Telephone Encounter (Signed)
   Primary Cardiologist: Kirk Ruths, MD  Chart reviewed as part of pre-operative protocol coverage. Given past medical history and time since last visit, based on ACC/AHA guidelines, ELIJIAH MICKLEY would be at acceptable risk for the planned procedure without further cardiovascular testing.   Per pharmacy recommendations, patient can hold eliquis 1 day prior to his procedure and should restart eliquis on the evening of his procedure or the day after, at the discretion of his gastroenterologist.   I will route this recommendation to the requesting party via [place] fax function and remove from pre-op pool.  Please call with questions.  Abigail Butts, PA-C 11/13/2018, 2:18 PM

## 2018-11-13 NOTE — Telephone Encounter (Signed)
Pharmacy called and is concerned about the med Na Sulfate-K Sulfate that was sent. She advised that she normally sees it to be taken 1 or 2 days and this one is for 30 days.

## 2018-11-19 ENCOUNTER — Ambulatory Visit (AMBULATORY_SURGERY_CENTER): Payer: Medicare HMO | Admitting: Internal Medicine

## 2018-11-19 ENCOUNTER — Encounter: Payer: Self-pay | Admitting: Internal Medicine

## 2018-11-19 VITALS — BP 112/73 | HR 82 | Temp 98.2°F | Resp 16 | Ht 67.0 in | Wt 180.0 lb

## 2018-11-19 DIAGNOSIS — D122 Benign neoplasm of ascending colon: Secondary | ICD-10-CM | POA: Diagnosis not present

## 2018-11-19 DIAGNOSIS — Z860100 Personal history of colon polyps, unspecified: Secondary | ICD-10-CM

## 2018-11-19 DIAGNOSIS — D123 Benign neoplasm of transverse colon: Secondary | ICD-10-CM

## 2018-11-19 LAB — HM COLONOSCOPY

## 2018-11-19 MED ORDER — SODIUM CHLORIDE 0.9 % IV SOLN: 500.0000 mL | Freq: Once | INTRAVENOUS | Status: DC

## 2018-11-19 NOTE — Progress Notes (Signed)
A/ox3, pleased with MAC, report to RN 

## 2018-11-19 NOTE — Patient Instructions (Signed)
    RESUME YOUR ELIQUIS  TODAY AT PRIOR DOSAGE  HANDOUT GIVEN FOR POLYPS AND DIVERTICULOSIS.  YOU HAD AN ENDOSCOPIC PROCEDURE TODAY AT [facility]:   Refer to the procedure report that was given to you for any specific questions about what was found during the examination.  If the procedure report does not answer your questions, please call your gastroenterologist to clarify.  If you requested that your care partner not be given the details of your procedure findings, then the procedure report has been included in a sealed envelope for you to review at your convenience later.  YOU SHOULD EXPECT: Some feelings of bloating in the abdomen. Passage of more gas than usual.  Walking can help get rid of the air that was put into your GI tract during the procedure and reduce the bloating. If you had a lower endoscopy (such as a colonoscopy or flexible sigmoidoscopy) you may notice spotting of blood in your stool or on the toilet paper. If you underwent a bowel prep for your procedure, you may not have a normal bowel movement for a few days.  Please Note:  You might notice some irritation and congestion in your nose or some drainage.  This is from the oxygen used during your procedure.  There is no need for concern and it should clear up in a day or so.  SYMPTOMS TO REPORT IMMEDIATELY:   Following lower endoscopy (colonoscopy or flexible sigmoidoscopy):  Excessive amounts of blood in the stool  Significant tenderness or worsening of abdominal pains  Swelling of the abdomen that is new, acute  Fever of 100F or higher    For urgent or emergent issues, a gastroenterologist can be reached at any hour by calling [phone].   DIET:  We do recommend a small meal at first, but then you may proceed to your regular diet.  Drink plenty of fluids but you should avoid alcoholic beverages for 24 hours.  ACTIVITY:  You should plan to take it easy for the rest of today and you should NOT  DRIVE or use heavy machinery until tomorrow (because of the sedation medicines used during the test).    FOLLOW UP: Our staff will call the number listed on your records the next business day following your procedure to check on you and address any questions or concerns that you may have regarding the information given to you following your procedure. If we do not reach you, we will leave a message.  However, if you are feeling well and you are not experiencing any problems, there is no need to return our call.  We will assume that you have returned to your regular daily activities without incident.  If any biopsies were taken you will be contacted by phone or by letter within the next 1-3 weeks.  Please call us at [phone] if you have not heard about the biopsies in 3 weeks.    SIGNATURES/CONFIDENTIALITY: You and/or your care partner have signed paperwork which will be entered into your electronic medical record.  These signatures attest to the fact that that the information above on your After Visit Summary has been reviewed and is understood.  Full responsibility of the confidentiality of this discharge information lies with you and/or your care-partner.

## 2018-11-19 NOTE — Op Note (Signed)
[facility] Patient Name: Blake [name] Procedure Date: 11/19/2018 2:24 PM MRN: [id number] Endoscopist: Docia Chuck. Henrene Pastor , MD Age: 73 Referring MD:  Date of Birth: [birth year] Gender: Male Account #: [id number] Procedure:                Colonoscopy with cold snare polypectomy x 2 Indications:              High risk colon cancer surveillance: Personal                            history of adenoma (10 mm or greater in size), High                            risk colon cancer surveillance: Personal history of                            adenoma with villous component, High risk colon                            cancer surveillance: Personal history of multiple                            (3 or more) adenomas. Previous examinations 2002,                            2003, 2004, 2008, 2014 Medicines:                Monitored Anesthesia Care Procedure:                Pre-Anesthesia Assessment:                           - Prior to the procedure, a History and Physical                            was performed, and patient medications and                            allergies were reviewed. The patient's tolerance of                            previous anesthesia was also reviewed. The risks                            and benefits of the procedure and the sedation                            options and risks were discussed with the patient.                            All questions were answered, and informed consent                            was obtained. Prior Anticoagulants: The patient has  taken Eliquis (apixaban). ASA Grade Assessment: III                            - A patient with severe systemic disease. After                            reviewing the risks and benefits, the patient was                            deemed in satisfactory condition to undergo the                            procedure.                           After obtaining informed consent, the  colonoscope                            was passed under direct vision. Throughout the                            procedure, the patient's blood pressure, pulse, and                            oxygen saturations were monitored continuously. The                            Colonoscope was introduced through the anus and                            advanced to the the cecum, identified by                            appendiceal orifice and ileocecal valve. The                            ileocecal valve, appendiceal orifice, and rectum                            were photographed. The quality of the bowel                            preparation was excellent. The colonoscopy was                            performed without difficulty. The patient tolerated                            the procedure well. The bowel preparation used was                            SUPREP. Scope In: 2:26:59 PM Scope Out: 2:42:21 PM Scope Withdrawal Time: 0 hours 13 minutes 17 seconds  Total Procedure Duration: 0 hours 15 minutes 22 seconds  Findings:  Two polyps were found in the transverse colon and                            ascending colon. The polyps were 3 to 4 mm in size.                            These polyps were removed with a cold snare.                            Resection and retrieval were complete.                           Multiple diverticula were found in the sigmoid                            colon.                           The exam was otherwise without abnormality on                            direct and retroflexion views. Complications:            No immediate complications. Estimated blood loss:                            None. Estimated Blood Loss:     Estimated blood loss: none. Impression:               - Two 3 to 4 mm polyps in the transverse colon and                            in the ascending colon, removed with a cold snare.                            Resected and  retrieved.                           - Diverticulosis in the sigmoid colon.                           - The examination was otherwise normal on direct                            and retroflexion views. Recommendation:           - Repeat colonoscopy in 5 years for surveillance to                            be considered based on overall health status and                            guidelines at that time.                           - Resume  Eliquis (apixaban) today at prior dose.                           - Patient has a contact number available for                            emergencies. The signs and symptoms of potential                            delayed complications were discussed with the                            patient. Return to normal activities tomorrow.                            Written discharge instructions were provided to the                            patient.                           - Resume previous diet.                           - Continue present medications.                           - Await pathology results. Docia Chuck. Henrene Pastor, MD 11/19/2018 2:47:17 PM This report has been signed electronically.

## 2018-11-19 NOTE — Progress Notes (Signed)
Called to room to assist during endoscopic procedure.  Patient ID and intended procedure confirmed with present staff. Received instructions for my participation in the procedure from the performing physician.  

## 2018-11-20 ENCOUNTER — Telehealth: Payer: Self-pay

## 2018-11-20 NOTE — Telephone Encounter (Signed)
  Follow up Call-  Call back number 11/19/2018  Post procedure Call Back phone  # [phone]  Permission to leave phone message Yes  Some recent data might be hidden     Patient questions:  Do you have a fever, pain , or abdominal swelling? No. Pain Score  0 *  Have you tolerated food without any problems? Yes.    Have you been able to return to your normal activities? Yes.    Do you have any questions about your discharge instructions: Diet   No. Medications  No. Follow up visit  No.  Do you have questions or concerns about your Care? Yes.  Some "surface bleeding" bright red blood from rectum which started during the prep.  Pt doesn't seem very concerned at this time.  RN advised him to call if increases or starts to concern him.  Pt agreed.  Actions: * If pain score is 4 or above: No action needed, pain <4.

## 2018-11-25 ENCOUNTER — Encounter: Payer: Self-pay | Admitting: Internal Medicine

## 2018-12-15 ENCOUNTER — Encounter: Payer: Self-pay | Admitting: Internal Medicine

## 2018-12-24 DIAGNOSIS — N401 Enlarged prostate with lower urinary tract symptoms: Secondary | ICD-10-CM | POA: Diagnosis not present

## 2018-12-24 DIAGNOSIS — R3121 Asymptomatic microscopic hematuria: Secondary | ICD-10-CM | POA: Diagnosis not present

## 2018-12-24 DIAGNOSIS — N3281 Overactive bladder: Secondary | ICD-10-CM | POA: Diagnosis not present

## 2018-12-24 DIAGNOSIS — R972 Elevated prostate specific antigen [PSA]: Secondary | ICD-10-CM | POA: Diagnosis not present

## 2019-01-01 ENCOUNTER — Other Ambulatory Visit: Payer: Self-pay | Admitting: Internal Medicine

## 2019-01-01 DIAGNOSIS — F502 Bulimia nervosa: Secondary | ICD-10-CM

## 2019-01-07 ENCOUNTER — Other Ambulatory Visit: Payer: Self-pay | Admitting: Internal Medicine

## 2019-01-07 DIAGNOSIS — F502 Bulimia nervosa: Secondary | ICD-10-CM

## 2019-01-22 ENCOUNTER — Other Ambulatory Visit: Payer: Self-pay | Admitting: Cardiology

## 2019-01-29 ENCOUNTER — Other Ambulatory Visit: Payer: Self-pay | Admitting: Cardiology

## 2019-01-29 NOTE — Telephone Encounter (Signed)
F/U Message             Patient is calling to check the status on his refill at [facility]. It looks like the order is waiting on a signature from Dr. Stanford Breed. Pls advise.

## 2019-01-29 NOTE — Telephone Encounter (Signed)
Eliquis refilled.  

## 2019-03-26 IMAGING — CR DG CHEST 1V PORT
1 series · 1 of 1 positions shown · non-contrast
Comparison: Chest radiograph performed 06/28/2016

CLINICAL DATA: Acute onset of worsening shortness of breath.
Initial encounter.

EXAM:
PORTABLE CHEST 1 VIEW

[AP]
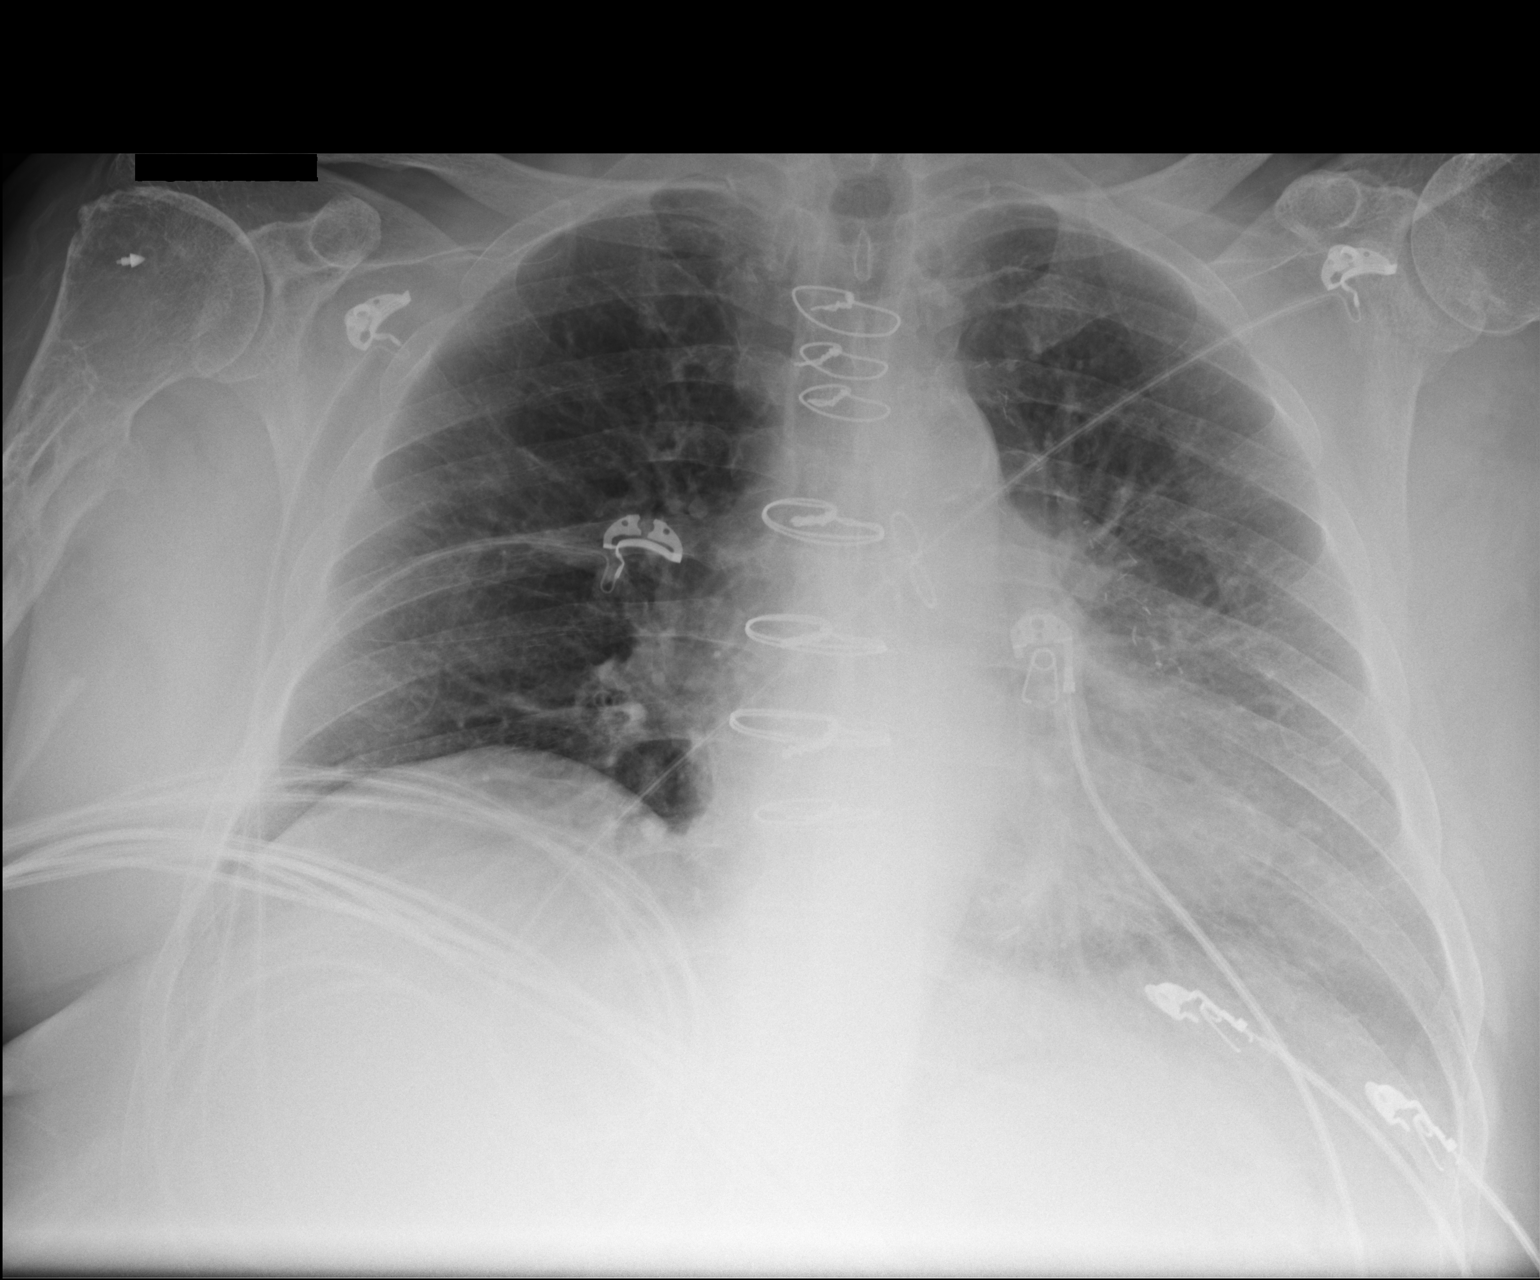

[1 of 1 positions shown; findings below may reference images not displayed]

FINDINGS: The lungs are well-aerated. Mild left basilar opacity could reflect
mild infection, depending on the patient's symptoms. No pleural
effusion or pneumothorax is seen.

The cardiomediastinal silhouette is normal in size. The patient is
status post median sternotomy, with evidence of prior CABG. No acute
osseous abnormalities are seen. The patient is status post
right-sided rotator cuff repair.
IMPRESSION: Mild left basilar airspace opacity may reflect mild infection,
depending on the patient's symptoms.

## 2019-03-28 ENCOUNTER — Other Ambulatory Visit: Payer: Self-pay | Admitting: Cardiology

## 2019-03-30 NOTE — Telephone Encounter (Signed)
73yo  Male 81.8kg Scr = 0.88 Last OV [place] on 02/2018

## 2019-04-06 ENCOUNTER — Other Ambulatory Visit: Payer: Self-pay | Admitting: Internal Medicine

## 2019-04-06 DIAGNOSIS — F502 Bulimia nervosa: Secondary | ICD-10-CM

## 2019-07-01 ENCOUNTER — Other Ambulatory Visit: Payer: Self-pay | Admitting: Cardiology

## 2019-07-09 ENCOUNTER — Other Ambulatory Visit: Payer: Self-pay | Admitting: Internal Medicine

## 2019-07-09 DIAGNOSIS — F502 Bulimia nervosa: Secondary | ICD-10-CM

## 2019-08-05 ENCOUNTER — Other Ambulatory Visit: Payer: Self-pay | Admitting: Cardiology

## 2019-08-05 NOTE — Telephone Encounter (Signed)
Please review for refill, Thanks !  

## 2019-08-15 ENCOUNTER — Other Ambulatory Visit: Payer: Self-pay | Admitting: Cardiology

## 2019-08-17 DIAGNOSIS — R69 Illness, unspecified: Secondary | ICD-10-CM | POA: Diagnosis not present

## 2019-09-30 ENCOUNTER — Other Ambulatory Visit: Payer: Self-pay | Admitting: Cardiology

## 2019-10-05 ENCOUNTER — Other Ambulatory Visit: Payer: Self-pay | Admitting: Cardiology

## 2019-10-06 NOTE — Telephone Encounter (Signed)
Rx has been sent to the pharmacy electronically. ° °

## 2019-10-12 ENCOUNTER — Other Ambulatory Visit: Payer: Self-pay | Admitting: Cardiology

## 2019-10-26 ENCOUNTER — Other Ambulatory Visit: Payer: Self-pay | Admitting: Cardiology

## 2019-10-28 ENCOUNTER — Telehealth: Payer: Self-pay | Admitting: Cardiology

## 2019-10-28 MED ORDER — APIXABAN 5 MG PO TABS: 5.0000 mg | ORAL_TABLET | Freq: Two times a day (BID) | ORAL | 0 refills | Status: DC

## 2019-10-28 NOTE — Telephone Encounter (Signed)
New message   Per Kristopher Oppenheim states that there is a discrepancy in the quantity should be a 90 day supply and an 8 day supply was sent in for ELIQUIS 5 MG TABS tablet. Please call the pharmacy to discuss.

## 2019-10-28 NOTE — Telephone Encounter (Signed)
Called and clarified the 90 supply situation and the pharmacist voiced understanding

## 2019-11-12 ENCOUNTER — Ambulatory Visit: Payer: Medicare Other | Attending: Internal Medicine

## 2019-11-12 DIAGNOSIS — Z23 Encounter for immunization: Secondary | ICD-10-CM | POA: Insufficient documentation

## 2019-11-12 NOTE — Progress Notes (Signed)
   Covid-19 Vaccination Clinic  Name:  Blake [name]    MRN: [id number] DOB: 1946/03/12  11/12/2019  Blake [name] was observed post Covid-19 immunization for 15 minutes without incidence. He was provided with Vaccine Information Sheet and instruction to access the V-Safe system.   Blake [name] was instructed to call 911 with any severe reactions post vaccine: [name] Difficulty breathing  . Swelling of your face and throat  . A fast heartbeat  . A bad rash all over your body  . Dizziness and weakness    Immunizations Administered    Name Date Dose VIS Date Route   Pfizer COVID-19 Vaccine 11/12/2019  4:44 PM 0.3 mL 10/02/2019 Intramuscular   Manufacturer: [place]   Lot: [id number]   [place]: [id number]

## 2019-11-17 ENCOUNTER — Ambulatory Visit: Payer: Medicare HMO

## 2019-11-18 ENCOUNTER — Other Ambulatory Visit: Payer: Self-pay | Admitting: Cardiology

## 2019-11-18 NOTE — Telephone Encounter (Signed)
Rx request sent to pharmacy.  

## 2019-11-30 NOTE — Progress Notes (Signed)
HPI: Followup coronary artery disease and atrial fibrillation. Abdominal CT in 2012 showed no aneurysm. Patient had coronary artery bypassing graft in 2012 with a LIMA to the LAD, saphenous vein graft to the first diagonal and saphenous vein graft to the acute marginal. Monitor in October of 2014 showed paroxysmal atrial fibrillation. Nuclear study in October 2014 showed an ejection fraction of 63% and normal perfusion. Last echocardiogram April 2018 showed normal LV systolic function, moderate to severe mitral regurgitation and moderate left atrial enlargement.  Echocardiogram repeated May 2019 and showed normal LV function, mild to moderate mitral regurgitation.  Since last seen,patient denies dyspnea, chest pain, palpitations or syncope.  Current Outpatient Medications  Medication Sig Dispense Refill  . alfuzosin (UROXATRAL) 10 MG 24 hr tablet Take 10 mg by mouth daily with breakfast.    . apixaban (ELIQUIS) 5 MG TABS tablet Take 1 tablet (5 mg total) by mouth 2 (two) times daily. 180 tablet 0  . atorvastatin (LIPITOR) 80 MG tablet Take 1 tablet (80 mg total) by mouth daily at 6 PM. 30 tablet 0  . hyoscyamine (LEVSIN SL) 0.125 MG SL tablet Place 0.125 mg under the tongue every 6 (six) hours as needed (for urinary frequency).    . metoprolol tartrate (LOPRESSOR) 50 MG tablet Take 1 tablet (50 mg total) by mouth 2 (two) times daily. Please schedule appointment for refills. 60 tablet 0  . topiramate (TOPAMAX) 100 MG tablet TAKE ONE TABLET BY MOUTH DAILY 90 tablet 0   No current facility-administered medications for this visit.     Past Medical History:  Diagnosis Date  . Angina    none since CABG  . Arthritis   . Bladder spasm    takes Levsin as needed  . CAD (coronary artery disease)   . Diverticulosis   . Duodenal ulcer    hx of  . Dysrhythmia    atrial fib post Cabg/ takes Eliquis daily as well as Metoprolol   . GERD (gastroesophageal reflux disease)    takes Protonix daily   . History of blood transfusion    not abnormal reaction  . History of colon polyps    benign  . History of kidney stones    hx of  . Hyperlipidemia    takes Atorvastatin daily  . HYPERLIPIDEMIA 05/25/2009  . Joint pain   . Nocturia   . PAF (paroxysmal atrial fibrillation) ([place])     Past Surgical History:  Procedure Laterality Date  . CARDIAC CATHETERIZATION     6/12  . COLONOSCOPY    . CORONARY ARTERY BYPASS GRAFT  03/27/2011   x3, Dr Tharon Aquas Trigt    . ESOPHAGOGASTRODUODENOSCOPY ENDOSCOPY  04/12/11   cauterization of bleeding ulcer  and artery in duodenum and stomach   . INGUINAL HERNIA REPAIR  11/06/2011   Procedure: HERNIA REPAIR INGUINAL ADULT;  Surgeon: Earnstine Regal, MD;  Location: [facility];  Service: General;  Laterality: N/A;  Repair left inguinal hernia with mesh  . WISDOM TOOTH EXTRACTION      Social History   Socioeconomic History  . Marital status: Married    Spouse name: Not on file  . Number of children: 4  . Years of education: Not on file  . Highest education level: Not on file  Occupational History  . Occupation: retired   Tobacco Use  . Smoking status: Former Smoker    Packs/day: 1.00    Years: 25.00    Pack years: 25.00  Quit date: 03/04/1988    Years since quitting: 31.7  . Smokeless tobacco: Never Used  Substance and Sexual Activity  . Alcohol use: Yes    Alcohol/week: 1.0 standard drinks    Types: 1 Cans of beer per week    Comment: occ  . Drug use: No  . Sexual activity: Not on file  Other Topics Concern  . Not on file  Social History Narrative   Regular exercise- yes    Social Determinants of Health   Financial Resource Strain:   . Difficulty of Paying Living Expenses: Not on file  Food Insecurity:   . Worried About Charity fundraiser in the Last Year: Not on file  . Ran Out of Food in the Last Year: Not on file  Transportation Needs:   . Lack of Transportation (Medical): Not on file  . Lack of Transportation (Non-Medical):  Not on file  Physical Activity:   . Days of Exercise per Week: Not on file  . Minutes of Exercise per Session: Not on file  Stress:   . Feeling of Stress : Not on file  Social Connections:   . Frequency of Communication with Friends and Family: Not on file  . Frequency of Social Gatherings with Friends and Family: Not on file  . Attends Religious Services: Not on file  . Active Member of Clubs or Organizations: Not on file  . Attends Archivist Meetings: Not on file  . Marital Status: Not on file  Intimate Partner Violence:   . Fear of Current or Ex-Partner: Not on file  . Emotionally Abused: Not on file  . Physically Abused: Not on file  . Sexually Abused: Not on file    Family History  Problem Relation Age of Onset  . Coronary artery disease Father   . Heart attack Father 75  . Cancer Father   . Lymphoma Other   . Diabetes Sister   . Heart attack Sister 54  . Ovarian cancer Sister   . Atrial fibrillation Brother   . Colon cancer Neg Hx   . Stomach cancer Neg Hx     ROS: Some tingling in upper extremities bilaterally and cramps in legs but no fevers or chills, productive cough, hemoptysis, dysphasia, odynophagia, melena, hematochezia, dysuria, hematuria, rash, seizure activity, orthopnea, PND, pedal edema, claudication. Remaining systems are negative.  Physical Exam: Well-developed well-nourished in no acute distress.  Skin is warm and dry.  HEENT is normal.  Neck is supple.  Chest is clear to auscultation with normal expansion.  Cardiovascular exam is irregular Abdominal exam nontender or distended. No masses palpated. Extremities show no edema. neuro grossly intact  ECG-atrial fibrillation at a rate of 88, normal axis, nonspecific ST changes.  Personally reviewed  A/P  1 coronary artery disease-patient denies recurrent chest pain.  Continue statin.  No aspirin given need for apixaban.  2 permanent atrial fibrillation-continue beta-blocker for rate  control.  Continue apixaban.    3 mitral regurgitation-mild to moderate on most recent echocardiogram but moderate to severe on previous.  Will repeat study.  Patient denies dyspnea.  4 hypertension-blood pressure controlled.  Continue present medications and follow.  5 hyperlipidemia-continue statin.    6 chronic diastolic congestive heart failure-he appears to be euvolemic on examination today.  Continue fluid restriction and low-sodium diet.  Kirk Ruths, MD

## 2019-12-03 ENCOUNTER — Telehealth: Payer: Self-pay | Admitting: Internal Medicine

## 2019-12-03 NOTE — Progress Notes (Signed)
°  Chronic Care Management   Outreach Note  12/03/2019 Name: Blake [name] MRN: [id number] DOB: 04/18/1946  Referred by: Janith Lima, MD Reason for referral : No chief complaint on file.   An unsuccessful telephone outreach was attempted today. The patient was referred to the pharmacist for assistance with care management and care coordination.   Follow Up Plan:   Raynicia Dukes [employer] Scheduler

## 2019-12-04 ENCOUNTER — Ambulatory Visit: Payer: Medicare Other | Attending: Internal Medicine

## 2019-12-04 DIAGNOSIS — Z23 Encounter for immunization: Secondary | ICD-10-CM

## 2019-12-04 NOTE — Progress Notes (Signed)
   Covid-19 Vaccination Clinic  Name:  Blake [name]    MRN: [id number] DOB: 11-Dec-1945  12/04/2019  [name] was observed post Covid-19 immunization for 15 minutes without incidence. He was provided with Vaccine Information Sheet and instruction to access the V-Safe system.   [name] was instructed to call 911 with any severe reactions post vaccine: [name] Difficulty breathing  . Swelling of your face and throat  . A fast heartbeat  . A bad rash all over your body  . Dizziness and weakness    Immunizations Administered    Name Date Dose VIS Date Route   Pfizer COVID-19 Vaccine 12/04/2019  8:45 AM 0.3 mL 10/02/2019 Intramuscular   Manufacturer: [place]   Lot: [id number]   Delta: [id number]

## 2019-12-07 DIAGNOSIS — H52223 Regular astigmatism, bilateral: Secondary | ICD-10-CM | POA: Diagnosis not present

## 2019-12-08 ENCOUNTER — Other Ambulatory Visit: Payer: Self-pay

## 2019-12-08 ENCOUNTER — Ambulatory Visit: Payer: Medicare Other | Admitting: Cardiology

## 2019-12-08 ENCOUNTER — Encounter: Payer: Self-pay | Admitting: Cardiology

## 2019-12-08 VITALS — BP 116/84 | HR 88 | Temp 98.4°F | Ht 66.0 in | Wt 204.0 lb

## 2019-12-08 DIAGNOSIS — R9431 Abnormal electrocardiogram [ECG] [EKG]: Secondary | ICD-10-CM | POA: Diagnosis not present

## 2019-12-08 DIAGNOSIS — I340 Nonrheumatic mitral (valve) insufficiency: Secondary | ICD-10-CM

## 2019-12-08 DIAGNOSIS — I2510 Atherosclerotic heart disease of native coronary artery without angina pectoris: Secondary | ICD-10-CM

## 2019-12-08 DIAGNOSIS — I10 Essential (primary) hypertension: Secondary | ICD-10-CM

## 2019-12-08 DIAGNOSIS — I482 Chronic atrial fibrillation, unspecified: Secondary | ICD-10-CM

## 2019-12-08 DIAGNOSIS — E7800 Pure hypercholesterolemia, unspecified: Secondary | ICD-10-CM

## 2019-12-08 NOTE — Patient Instructions (Signed)
Medication Instructions:  NO CHANGE *If you need a refill on your cardiac medications before your next appointment, please call your pharmacy*  Lab Work: If you have labs (blood work) drawn today and your tests are completely normal, you will receive your results only by: [name] MyChart Message (if you have MyChart) OR . A paper copy in the mail If you have any lab test that is abnormal or we need to change your treatment, we will call you to review the results.  Testing/Procedures: Your physician has requested that you have an echocardiogram. Echocardiography is a painless test that uses sound waves to create images of your heart. It provides your doctor with information about the size and shape of your heart and how well your heart's chambers and valves are working. This procedure takes approximately one hour. There are no restrictions for this procedure.[place]    Follow-Up: At [facility], you and your health needs are our priority.  As part of our continuing mission to provide you with exceptional heart care, we have created designated Provider Care Teams.  These Care Teams include your primary Cardiologist (physician) and Advanced Practice Providers (APPs -  Physician Assistants and Nurse Practitioners) who all work together to provide you with the care you need, when you need it.  Your next appointment:   12 month(s)  The format for your next appointment:   Either In Person or Virtual  Provider:   You may see Kirk Ruths, MD or one of the following Advanced Practice Providers on your designated Care Team:    Kerin Ransom, PA-C  [place], [place]  Coletta Memos, [place]

## 2019-12-09 ENCOUNTER — Encounter: Payer: Self-pay | Admitting: Internal Medicine

## 2019-12-09 ENCOUNTER — Ambulatory Visit (INDEPENDENT_AMBULATORY_CARE_PROVIDER_SITE_OTHER): Payer: Medicare Other | Admitting: Internal Medicine

## 2019-12-09 VITALS — BP 118/70 | HR 93 | Temp 98.4°F | Ht 66.0 in | Wt 202.0 lb

## 2019-12-09 DIAGNOSIS — R972 Elevated prostate specific antigen [PSA]: Secondary | ICD-10-CM

## 2019-12-09 DIAGNOSIS — D513 Other dietary vitamin B12 deficiency anemia: Secondary | ICD-10-CM

## 2019-12-09 DIAGNOSIS — R202 Paresthesia of skin: Secondary | ICD-10-CM

## 2019-12-09 DIAGNOSIS — L853 Xerosis cutis: Secondary | ICD-10-CM | POA: Diagnosis not present

## 2019-12-09 DIAGNOSIS — I482 Chronic atrial fibrillation, unspecified: Secondary | ICD-10-CM | POA: Diagnosis not present

## 2019-12-09 DIAGNOSIS — E785 Hyperlipidemia, unspecified: Secondary | ICD-10-CM

## 2019-12-09 DIAGNOSIS — N400 Enlarged prostate without lower urinary tract symptoms: Secondary | ICD-10-CM | POA: Diagnosis not present

## 2019-12-09 DIAGNOSIS — L570 Actinic keratosis: Secondary | ICD-10-CM | POA: Diagnosis not present

## 2019-12-09 DIAGNOSIS — Z0000 Encounter for general adult medical examination without abnormal findings: Secondary | ICD-10-CM

## 2019-12-09 DIAGNOSIS — K5904 Chronic idiopathic constipation: Secondary | ICD-10-CM | POA: Insufficient documentation

## 2019-12-09 DIAGNOSIS — I5032 Chronic diastolic (congestive) heart failure: Secondary | ICD-10-CM | POA: Diagnosis not present

## 2019-12-09 DIAGNOSIS — L821 Other seborrheic keratosis: Secondary | ICD-10-CM | POA: Diagnosis not present

## 2019-12-09 DIAGNOSIS — Z85828 Personal history of other malignant neoplasm of skin: Secondary | ICD-10-CM | POA: Diagnosis not present

## 2019-12-09 DIAGNOSIS — R200 Anesthesia of skin: Secondary | ICD-10-CM

## 2019-12-09 LAB — HEPATIC FUNCTION PANEL
ALT: 22 U/L (ref 0–53)
AST: 21 U/L (ref 0–37)
Albumin: 4.1 g/dL (ref 3.5–5.2)
Alkaline Phosphatase: 67 U/L (ref 39–117)
Bilirubin, Direct: 0.2 mg/dL (ref 0.0–0.3)
Total Bilirubin: 1 mg/dL (ref 0.2–1.2)
Total Protein: 7 g/dL (ref 6.0–8.3)

## 2019-12-09 LAB — CBC WITH DIFFERENTIAL/PLATELET
Basophils Absolute: 0.1 10*3/uL (ref 0.0–0.1)
Basophils Relative: 1.2 % (ref 0.0–3.0)
Eosinophils Absolute: 0.3 10*3/uL (ref 0.0–0.7)
Eosinophils Relative: 3.2 % (ref 0.0–5.0)
HCT: 43.7 % (ref 39.0–52.0)
Hemoglobin: 14.9 g/dL (ref 13.0–17.0)
Lymphocytes Relative: 24.4 % (ref 12.0–46.0)
Lymphs Abs: 2 10*3/uL (ref 0.7–4.0)
MCHC: 34 g/dL (ref 30.0–36.0)
MCV: 98 fl (ref 78.0–100.0)
Monocytes Absolute: 1 10*3/uL (ref 0.1–1.0)
Monocytes Relative: 12.2 % — ABNORMAL HIGH (ref 3.0–12.0)
Neutro Abs: 4.9 10*3/uL (ref 1.4–7.7)
Neutrophils Relative %: 59 % (ref 43.0–77.0)
Platelets: 243 10*3/uL (ref 150.0–400.0)
RBC: 4.46 Mil/uL (ref 4.22–5.81)
RDW: 12.8 % (ref 11.5–15.5)
WBC: 8.4 10*3/uL (ref 4.0–10.5)

## 2019-12-09 LAB — BASIC METABOLIC PANEL
BUN: 14 mg/dL (ref 6–23)
CO2: 27 mEq/L (ref 19–32)
Calcium: 9.5 mg/dL (ref 8.4–10.5)
Chloride: 106 mEq/L (ref 96–112)
Creatinine, Ser: 0.8 mg/dL (ref 0.40–1.50)
GFR: 94.09 mL/min (ref >60.00)
Glucose, Bld: 97 mg/dL (ref 70–99)
Potassium: 4.1 mEq/L (ref 3.5–5.1)
Sodium: 139 mEq/L (ref 135–145)

## 2019-12-09 LAB — LIPID PANEL
Cholesterol: 98 mg/dL (ref 0–200)
HDL: 39.6 mg/dL (ref >39.00)
LDL Cholesterol: 47 mg/dL (ref 0–99)
NonHDL: 58.86
Total CHOL/HDL Ratio: 2
Triglycerides: 61 mg/dL (ref 0.0–149.0)
VLDL: 12.2 mg/dL (ref 0.0–40.0)

## 2019-12-09 LAB — TSH: TSH: 0.87 u[IU]/mL (ref 0.35–4.50)

## 2019-12-09 LAB — FOLATE: Folate: 13.8 ng/mL (ref >5.9)

## 2019-12-09 LAB — VITAMIN B12: Vitamin B-12: 301 pg/mL (ref 211–911)

## 2019-12-09 LAB — PSA: PSA: 5.6

## 2019-12-09 NOTE — Patient Instructions (Signed)

## 2019-12-09 NOTE — Progress Notes (Signed)
Subjective:  Patient ID: Blake [name], male    DOB: 1946-03-23  Age: 74 y.o. MRN: [id number]  CC: Annual Exam, Atrial Fibrillation, and Hyperlipidemia  This visit occurred during the SARS-CoV-2 public health emergency.  Safety protocols were in place, including screening questions prior to the visit, additional usage of staff PPE, and extensive cleaning of exam room while observing appropriate contact time as indicated for disinfecting solutions.    HPI [name] presents for a CPX.  He complains of a 63-month history of numbness and tingling symmetrically in both upper and lower extremities.  He says the symptoms wake him up at night and interfere with his sleep.  He tells me he is no longer taking Topamax and is not taking a B12 supplement.  He denies headache, nausea, vomiting, neck pain, or low back pain.  He is active and denies any recent episodes of chest pain, shortness of breath, palpitations, edema, or fatigue.  He does complain of weight gain.  Outpatient Medications Prior to Visit  Medication Sig Dispense Refill  . alfuzosin (UROXATRAL) 10 MG 24 hr tablet Take 10 mg by mouth daily with breakfast.    . apixaban (ELIQUIS) 5 MG TABS tablet Take 1 tablet (5 mg total) by mouth 2 (two) times daily. 180 tablet 0  . atorvastatin (LIPITOR) 80 MG tablet Take 1 tablet (80 mg total) by mouth daily at 6 PM. 30 tablet 0  . metoprolol tartrate (LOPRESSOR) 50 MG tablet Take 1 tablet (50 mg total) by mouth 2 (two) times daily. Please schedule appointment for refills. 60 tablet 0  . hyoscyamine (LEVSIN SL) 0.125 MG SL tablet Place 0.125 mg under the tongue every 6 (six) hours as needed (for urinary frequency).    . topiramate (TOPAMAX) 100 MG tablet TAKE ONE TABLET BY MOUTH DAILY 90 tablet 0   No facility-administered medications prior to visit.    ROS Review of Systems  Constitutional: Positive for unexpected weight change (wt gain). Negative for appetite change, diaphoresis and  fatigue.  HENT: Negative.   Eyes: Negative.   Respiratory: Negative for cough, chest tightness, shortness of breath and wheezing.   Cardiovascular: Negative for chest pain, palpitations and leg swelling.  Gastrointestinal: Positive for constipation. Negative for abdominal pain, diarrhea, nausea and vomiting.       He complains of recurrent episodes of constipation and abdominal bloating.  This has been happening intermittently for the last 3 to 4 years.  He uses MiraLAX to treat this.  He was previously told to see a GI doctor because there was concern about a blockage.  He says he never actually saw the GI doctor.  Endocrine: Negative.   Genitourinary: Negative.  Negative for difficulty urinating, dysuria and hematuria.  Musculoskeletal: Negative for arthralgias, back pain, myalgias and neck pain.  Skin: Negative.  Negative for color change.  Neurological: Positive for numbness. Negative for dizziness, tremors, weakness, light-headedness and headaches.  Hematological: Negative for adenopathy. Does not bruise/bleed easily.  Psychiatric/Behavioral: Negative.     Objective:  BP 118/70 (BP Location: Left Arm, Patient Position: Sitting, Cuff Size: Large)   Pulse 93   Temp 98.4 F (36.9 C) (Oral)   Ht 5\' 6"  (1.676 m)   Wt 202 lb (91.6 kg)   SpO2 95%   BMI 32.60 kg/m   BP Readings from Last 3 Encounters:  12/09/19 118/70  12/08/19 116/84  11/19/18 112/73    Wt Readings from Last 3 Encounters:  12/09/19 202 lb (91.6 kg)  12/08/19 204 lb (92.5 kg)  11/19/18 180 lb (81.6 kg)    Physical Exam Vitals reviewed.  Constitutional:      Appearance: Normal appearance.  HENT:     Nose: Nose normal.     Mouth/Throat:     Mouth: Mucous membranes are moist.  Eyes:     General: No scleral icterus.    Conjunctiva/sclera: Conjunctivae normal.  Cardiovascular:     Rate and Rhythm: Normal rate. Rhythm irregularly irregular.     Heart sounds: Normal heart sounds, S1 normal and S2 normal. No  murmur. No gallop.   Pulmonary:     Effort: Pulmonary effort is normal.     Breath sounds: No stridor. No wheezing, rhonchi or rales.  Abdominal:     General: Abdomen is protuberant. Bowel sounds are normal. There is no distension.     Palpations: Abdomen is soft. There is no hepatomegaly, splenomegaly or mass.     Tenderness: There is no abdominal tenderness.     Hernia: No hernia is present. There is no hernia in the left inguinal area or right inguinal area.  Genitourinary:    Pubic Area: No rash.      Penis: Normal and uncircumcised. No phimosis, paraphimosis, hypospadias, erythema, tenderness, discharge, swelling or lesions.      Testes: Normal.     Epididymis:     Right: Normal.     Left: Normal.     Prostate: Enlarged. Not tender and no nodules present.     Rectum: Normal. Guaiac result negative. No mass, tenderness, anal fissure, external hemorrhoid or internal hemorrhoid. Normal anal tone.  Musculoskeletal:        General: No swelling.     Cervical back: Neck supple.     Right lower leg: No edema.     Left lower leg: No edema.  Lymphadenopathy:     Cervical: No cervical adenopathy.     Lower Body: No right inguinal adenopathy. No left inguinal adenopathy.  Skin:    General: Skin is warm and dry.     Coloration: Skin is not pale.  Neurological:     General: No focal deficit present.     Mental Status: He is alert and oriented to person, place, and time. Mental status is at baseline.     Sensory: No sensory deficit.     Motor: No weakness.     Coordination: Coordination normal.     Gait: Gait normal.     Deep Tendon Reflexes: Reflexes normal.  Psychiatric:        Mood and Affect: Mood normal.        Behavior: Behavior normal.     Lab Results  Component Value Date   WBC 8.4 12/09/2019   HGB 14.9 12/09/2019   HCT 43.7 12/09/2019   PLT 243.0 12/09/2019   GLUCOSE 97 12/09/2019   CHOL 98 12/09/2019   TRIG 61.0 12/09/2019   HDL 39.60 12/09/2019   LDLCALC 47  12/09/2019   ALT 22 12/09/2019   AST 21 12/09/2019   NA 139 12/09/2019   K 4.1 12/09/2019   CL 106 12/09/2019   CREATININE 0.80 12/09/2019   BUN 14 12/09/2019   CO2 27 12/09/2019   TSH 0.87 12/09/2019   PSA 5.6 12/09/2019   INR 1.05 01/29/2017   HGBA1C 5.8 11/07/2015    No results found.  Assessment & Plan:   Menzo was seen today for annual exam, atrial fibrillation and hyperlipidemia.  Diagnoses and all orders for this visit:  Atrial fibrillation, chronic ([place])- He has adequate rate control.  Will continue anticoagulation with the DOAC. -     TSH  Benign prostatic hyperplasia without lower urinary tract symptoms- His PSA has not risen much over the last year.  This is a reassuring sign that he does not have prostate cancer.  He has no symptoms that need to be treated. -     PSA, total and free  Dyslipidemia, goal LDL below 70- He has achieved his LDL goal and is doing well on the statin. -     Lipid panel -     Hepatic function panel -     TSH  PSA elevation- See above. -     PSA, total and free  Routine general medical examination at a health care facility- Exam completed, labs reviewed, vaccines reviewed and updated, colon cancer screening is up-to-date, patient education was given.  Other dietary vitamin B12 deficiency anemia- His B12 level is normal.  This is not contributing to his numbness and tingling. -     CBC with Differential/Platelet -     Vitamin B12 -     Folate  Diastolic dysfunction with chronic heart failure ([place])- He has a normal volume status and his blood pressure is adequately well controlled. -     Basic metabolic panel  Chronic idiopathic constipation -     Ambulatory referral to Gastroenterology  Numbness and tingling of both upper extremities while sleeping- His labs are negative for secondary causes.  He is not taking any medications that would contribute to this.  His neurologic exam is unremarkable.  I recommended that he undergo a  nerve conduction study and electromyogram to try to identify the cause for this. -     Ambulatory referral to Neurology  Other orders -     Tdap vaccine greater than or equal to 7yo IM   I have discontinued [name] "Ken"'s hyoscyamine and topiramate. I am also having him maintain his alfuzosin, apixaban, metoprolol tartrate, and atorvastatin.  No orders of the defined types were placed in this encounter.    Follow-up: Return in about 6 months (around 06/07/2020).  Scarlette Calico, MD

## 2019-12-10 ENCOUNTER — Encounter: Payer: Self-pay | Admitting: Internal Medicine

## 2019-12-10 DIAGNOSIS — R200 Anesthesia of skin: Secondary | ICD-10-CM | POA: Insufficient documentation

## 2019-12-10 LAB — PSA, TOTAL AND FREE
PSA, % Free: 25 % (calc) — ABNORMAL LOW (ref >25)
PSA, Free: 1.4 ng/mL
PSA, Total: 5.6 ng/mL — ABNORMAL HIGH (ref <=4.0)

## 2019-12-10 MED ORDER — TETANUS-DIPHTH-ACELL PERTUSSIS 5-2.5-18.5 LF-MCG/0.5 IM SUSP: 0.5000 mL | Freq: Once | INTRAMUSCULAR | 0 refills | Status: AC

## 2019-12-11 ENCOUNTER — Other Ambulatory Visit: Payer: Self-pay

## 2019-12-11 ENCOUNTER — Encounter: Payer: Self-pay | Admitting: Neurology

## 2019-12-11 DIAGNOSIS — R200 Anesthesia of skin: Secondary | ICD-10-CM

## 2019-12-14 ENCOUNTER — Telehealth: Payer: Self-pay | Admitting: Internal Medicine

## 2019-12-14 NOTE — Progress Notes (Signed)
  Chronic Care Management   Outreach Note  12/14/2019 Name: BARTH REPP MRN: [id number] DOB: 1946-08-10  Referred by: Janith Lima, MD Reason for referral : No chief complaint on file.   An unsuccessful telephone outreach was attempted today. The patient was referred to the pharmacist for assistance with care management and care coordination.   Follow Up Plan:   Raynicia Dukes [employer] Scheduler

## 2019-12-15 ENCOUNTER — Other Ambulatory Visit: Payer: Self-pay

## 2019-12-15 ENCOUNTER — Ambulatory Visit (INDEPENDENT_AMBULATORY_CARE_PROVIDER_SITE_OTHER): Payer: Medicare Other | Admitting: Neurology

## 2019-12-15 DIAGNOSIS — R200 Anesthesia of skin: Secondary | ICD-10-CM | POA: Diagnosis not present

## 2019-12-15 DIAGNOSIS — M5412 Radiculopathy, cervical region: Secondary | ICD-10-CM

## 2019-12-15 DIAGNOSIS — R202 Paresthesia of skin: Secondary | ICD-10-CM

## 2019-12-15 DIAGNOSIS — G5602 Carpal tunnel syndrome, left upper limb: Secondary | ICD-10-CM

## 2019-12-15 NOTE — Procedures (Signed)
[facility]  [address]  [place], [place] [address] Tel: [phone] Fax:  [phone] Test Date:  12/15/2019  Patient: Blake [name] DOB: 04-25-1946 Physician: Narda Amber, DO  Sex: Male Height: 5\' 6"  Ref Phys: Scarlette Calico, MD  ID#: [id number] Temp: 34.0C Technician:    Patient Complaints: This is a 74 year old man referred for evaluation of bilateral arm numbness and tingling.  NCV & EMG Findings: Extensive electrodiagnostic testing of the right upper extremity and additional studies of the left shows:  1. Left median sensory response shows prolonged latency (4.0 ms) and reduced amplitude (8.4 V).  Right median, right mixed palmar, and bilateral ulnar sensory responses are within normal limits. 2. Left median motor response shows prolonged latency (4.5 ms).  Right median and bilateral ulnar motor responses are within normal limits.   3. Chronic motor axonal loss changes are seen affecting the right biceps, right deltoid, and the left abductor pollicis brevis muscles, without accompanied active denervation.    Impression: 1. Chronic C5-6 radiculopathy affecting the right upper extremity, mild-to-moderate. 2. Left median neuropathy at or distal to the wrist (moderate), consistent with a clinical diagnosis of carpal tunnel syndrome.   3. There is no evidence of right carpal tunnel syndrome or cervical radiculopathy affecting the left upper extremity.   ___________________________ Narda Amber, DO    Nerve Conduction Studies Anti Sensory Summary Table   Site NR Peak (ms) Norm Peak (ms) P-T Amp (V) Norm P-T Amp  Left Median Anti Sensory (2nd Digit)  34C  Wrist    4.0 <3.8 8.4 >10  Right Median Anti Sensory (2nd Digit)  34C  Wrist    3.2 <3.8 15.2 >10  Left Ulnar Anti Sensory (5th Digit)  34C  Wrist    2.8 <3.2 14.8 >5  Right Ulnar Anti Sensory (5th Digit)  34C  Wrist    2.8 <3.2 12.9 >5   Motor Summary Table   Site NR Onset (ms) Norm  Onset (ms) O-P Amp (mV) Norm O-P Amp Site1 Site2 Delta-0 (ms) Dist (cm) Vel (m/s) Norm Vel (m/s)  Left Median Motor (Abd Poll Brev)  34C  Wrist    4.5 <4.0 8.0 >5 Elbow Wrist 5.3 29.0 55 >50  Elbow    9.8  7.6         Right Median Motor (Abd Poll Brev)  34C  Wrist    3.4 <4.0 9.1 >5 Elbow Wrist 4.8 27.0 56 >50  Elbow    8.2  8.9         Left Ulnar Motor (Abd Dig Minimi)  34C  Wrist    2.3 <3.1 10.2 >7 B Elbow Wrist 4.0 24.0 60 >50  B Elbow    6.3  9.6  A Elbow B Elbow 1.7 10.0 59 >50  A Elbow    8.0  9.4         Right Ulnar Motor (Abd Dig Minimi)  34C  Wrist    2.3 <3.1 11.3 >7 B Elbow Wrist 4.0 24.0 60 >50  B Elbow    6.3  10.2  A Elbow B Elbow 1.8 10.0 56 >50  A Elbow    8.1  10.0          Comparison Summary Table   Site NR Peak (ms) Norm Peak (ms) P-T Amp (V) Site1 Site2 Delta-P (ms) Norm Delta (ms)  Right Median/Ulnar Palm Comparison (Wrist - 8cm)  34C  Median Palm    2.1 <2.2 28.2 Median Palm  Ulnar Palm 0.3   Ulnar Palm    1.8 <2.2 7.1       EMG   Side Muscle Ins Act Fibs Psw Fasc Number Recrt Dur Dur. Amp Amp. Poly Poly. Comment  Right 1stDorInt Nml Nml Nml Nml Nml Nml Nml Nml Nml Nml Nml Nml N/A  Right Ext Indicis Nml Nml Nml Nml Nml Nml Nml Nml Nml Nml Nml Nml N/A  Right PronatorTeres Nml Nml Nml Nml Nml Nml Nml Nml Nml Nml Nml Nml N/A  Right Biceps Nml Nml Nml Nml 1- Rapid Some 1+ Some 1+ Some 1+ N/A  Right Triceps Nml Nml Nml Nml Nml Nml Nml Nml Nml Nml Nml Nml N/A  Right Deltoid Nml Nml Nml Nml 1- Rapid Many 1+ Many 1+ Many 1+ N/A  Left 1stDorInt Nml Nml Nml Nml Nml Nml Nml Nml Nml Nml Nml Nml N/A  Left Abd Poll Brev Nml Nml Nml Nml 1- Rapid Some 1+ Some 1+ Some 1+ N/A  Left PronatorTeres Nml Nml Nml Nml Nml Nml Nml Nml Nml Nml Nml Nml N/A  Left Biceps Nml Nml Nml Nml Nml Nml Nml Nml Nml Nml Nml Nml N/A  Left Triceps Nml Nml Nml Nml Nml Nml Nml Nml Nml Nml Nml Nml N/A  Left Deltoid Nml Nml Nml Nml Nml Nml Nml Nml Nml Nml Nml Nml N/A      Waveforms:

## 2019-12-20 ENCOUNTER — Encounter: Payer: Self-pay | Admitting: Internal Medicine

## 2019-12-28 ENCOUNTER — Ambulatory Visit (HOSPITAL_COMMUNITY): Payer: Medicare Other | Attending: Cardiology

## 2019-12-28 ENCOUNTER — Other Ambulatory Visit: Payer: Self-pay

## 2019-12-28 ENCOUNTER — Other Ambulatory Visit: Payer: Self-pay | Admitting: Cardiology

## 2019-12-28 DIAGNOSIS — I340 Nonrheumatic mitral (valve) insufficiency: Secondary | ICD-10-CM

## 2019-12-31 ENCOUNTER — Other Ambulatory Visit: Payer: Self-pay | Admitting: Cardiology

## 2020-01-05 DIAGNOSIS — R972 Elevated prostate specific antigen [PSA]: Secondary | ICD-10-CM | POA: Diagnosis not present

## 2020-01-11 DIAGNOSIS — N401 Enlarged prostate with lower urinary tract symptoms: Secondary | ICD-10-CM | POA: Diagnosis not present

## 2020-01-11 DIAGNOSIS — R3121 Asymptomatic microscopic hematuria: Secondary | ICD-10-CM | POA: Diagnosis not present

## 2020-01-11 DIAGNOSIS — R972 Elevated prostate specific antigen [PSA]: Secondary | ICD-10-CM | POA: Diagnosis not present

## 2020-01-11 DIAGNOSIS — R3915 Urgency of urination: Secondary | ICD-10-CM | POA: Diagnosis not present

## 2020-01-20 ENCOUNTER — Ambulatory Visit: Payer: Medicare Other | Admitting: Internal Medicine

## 2020-01-20 ENCOUNTER — Encounter: Payer: Self-pay | Admitting: Internal Medicine

## 2020-01-20 VITALS — BP 106/64 | HR 80 | Temp 98.9°F | Ht 66.0 in | Wt 201.0 lb

## 2020-01-20 DIAGNOSIS — M6208 Separation of muscle (nontraumatic), other site: Secondary | ICD-10-CM | POA: Diagnosis not present

## 2020-01-20 DIAGNOSIS — K5900 Constipation, unspecified: Secondary | ICD-10-CM

## 2020-01-20 DIAGNOSIS — E65 Localized adiposity: Secondary | ICD-10-CM | POA: Diagnosis not present

## 2020-01-20 DIAGNOSIS — R140 Abdominal distension (gaseous): Secondary | ICD-10-CM | POA: Diagnosis not present

## 2020-01-20 MED ORDER — POLYETHYLENE GLYCOL 3350 17 GM/SCOOP PO POWD: 1.0000 | Freq: Every day | ORAL | 3 refills | Status: DC

## 2020-01-20 NOTE — Progress Notes (Signed)
HISTORY OF PRESENT ILLNESS:  Blake [name] is a 74 y.o. male, retired Forensic psychologist from [name], who presents today distressed over problems with chronic abdominal bloating discomfort.  The patient states that he has had this problem for several years.  He was told by one provider that it was related to constipation.  This despite having 2 or 3 bowel movements daily.  He was prescribed MiraLAX which he states did make him a bit more comfortable.  The abdominal fullness or bloating discomfort may last for several weeks or be absent for several months.  There does seem to be a strong relationship between his body weight and symptoms.  When he is significantly overweight he is significantly uncomfortable.  When his weight is lower, he feels well.  His weight may fluctuate anywhere from 165 pounds to the current 201 pounds.  He describes it as if he has a "basketball" in his abdomen.  No nausea or vomiting.  No bleeding.  He does have a history of multiple adenomatous colon polyps.  His surveillance colonoscopy is up-to-date.  Last colonoscopy November 19, 2018.  Review of outside blood work from February 2021 finds normal comprehensive metabolic panel and normal CBC with hemoglobin 14.9.  Normal TSH.  He did have plain films of the abdomen September 2017 which did reveal increased colonic stool burden.  Patient has multiple questions and concerns throughout the interview.  He tells me that his discomfort is not necessarily improved with defecation.  Not worsened by meals.  Might affect his sleep depending upon the position.  He has received his Covid vaccinations  REVIEW OF SYSTEMS:  All non-GI ROS negative unless otherwise stated in the HPI except for arthritis  Past Medical History:  Diagnosis Date  . Angina    none since CABG  . Arthritis   . Bladder spasm    takes Levsin as needed  . CAD (coronary artery disease)   . Diverticulosis   . Duodenal ulcer    hx of  . Dysrhythmia    atrial fib post Cabg/ takes Eliquis daily as well as Metoprolol   . GERD (gastroesophageal reflux disease)    takes Protonix daily  . History of blood transfusion    not abnormal reaction  . History of colon polyps    benign  . History of kidney stones    hx of  . Hyperlipidemia    takes Atorvastatin daily  . HYPERLIPIDEMIA 05/25/2009  . Joint pain   . Nocturia   . PAF (paroxysmal atrial fibrillation) (Blake [name])     Past Surgical History:  Procedure Laterality Date  . CARDIAC CATHETERIZATION     6/12  . COLONOSCOPY    . CORONARY ARTERY BYPASS GRAFT  03/27/2011   x3, Dr Tharon Aquas Trigt    . ESOPHAGOGASTRODUODENOSCOPY ENDOSCOPY  04/12/11   cauterization of bleeding ulcer  and artery in duodenum and stomach   . INGUINAL HERNIA REPAIR  11/06/2011   Procedure: HERNIA REPAIR INGUINAL ADULT;  Surgeon: Earnstine Regal, MD;  Location: [facility];  Service: General;  Laterality: N/A;  Repair left inguinal hernia with mesh  . [place] EXTRACTION      Social History Blake [name]  reports that he quit smoking about 31 years ago. He has a 25.00 pack-year smoking history. He has never used smokeless tobacco. He reports current alcohol use of about 1.0 standard drinks of alcohol per week. He reports that he does not use drugs.  family  history includes Atrial fibrillation in his brother; Cancer in his father; Coronary artery disease in his father; Diabetes in his sister; Heart attack (age of onset: 73) in his sister; Heart attack (age of onset: 28) in his father; Lymphoma in an other family member; Ovarian cancer in his sister.  No Known Allergies     PHYSICAL EXAMINATION: Vital signs: BP 106/64   Pulse 80   Temp 98.9 F (37.2 C)   Ht _0  (1.676 m)   Wt 201 lb (91.2 kg)   BMI 32.44 kg/m   Constitutional: generally well-appearing, no acute distress Psychiatric: alert and oriented x3, cooperative Eyes: extraocular movements intact, anicteric, conjunctiva pink Mouth: oral pharynx moist, no  lesions Neck: supple no lymphadenopathy Cardiovascular: heart regular rate and rhythm, no murmur Lungs: clear to auscultation bilaterally Abdomen: soft, obese, nontender, nondistended, no obvious ascites, no peritoneal signs, normal bowel sounds, no organomegaly.  Midline diastases present Rectal: Omitted Extremities: no clubbing, cyanosis, or lower extremity edema bilaterally Skin: no lesions on visible extremities Neuro: No focal deficits.  Cranial nerves intact  ASSESSMENT:  1.  Abdominal bloating discomfort related to truncal obesity 2.  Diastases recti abdominis 3.  Relative constipation 4.  History of adenomatous colon polyps.  Surveillance up-to-date 5.  Health related anxiety   PLAN:  1.  Exercise and weight loss 2.  GlycoLax to achieve frequency of bowel movements that resulted in relief of abdominal distress.  Prescribed.  Medication risks reviewed. 3.  Abdominal ultrasound.  Rule out any other intra-abdominal process leading to his abdominal distress or discomfort.  We will contact the patient with the results 4.  Ongoing general medical care with Dr. Ronnald Ramp 5.  Repeat surveillance colonoscopy to be considered around 2025 A total time of 40 minutes was spent preparing to [name] the patient, reviewing test and x-rays, obtaining history, performing comprehensive medical physical examination, counseling the patient regarding his above listed issues and answering a myriad of questions, ordering medications and advanced radiology studies, and documenting clinical information in the health record.

## 2020-01-20 NOTE — Patient Instructions (Signed)
You have been scheduled for an abdominal ultrasound at [facility] (1st floor of hospital) on 01/27/2020 at 9:30am. Please arrive 15 minutes prior to your appointment for registration. Make certain not to have anything to eat or drink after midnight prior to your appointment. Should you need to reschedule your appointment, please contact radiology at [phone]. This test typically takes about 30 minutes to perform.  We have sent the following medications to your pharmacy for you to pick up at your convenience: Glycolax.  Take as needed

## 2020-01-26 ENCOUNTER — Other Ambulatory Visit: Payer: Self-pay

## 2020-01-26 ENCOUNTER — Ambulatory Visit (HOSPITAL_COMMUNITY)
Admission: RE | Admit: 2020-01-26 | Discharge: 2020-01-26 | Disposition: A | Discharge status: Home / Self Care | Payer: Medicare Other | Source: Ambulatory Visit | Attending: Internal Medicine | Admitting: Internal Medicine

## 2020-01-26 DIAGNOSIS — R140 Abdominal distension (gaseous): Secondary | ICD-10-CM | POA: Diagnosis not present

## 2020-01-26 DIAGNOSIS — K5900 Constipation, unspecified: Secondary | ICD-10-CM | POA: Insufficient documentation

## 2020-01-27 ENCOUNTER — Ambulatory Visit (HOSPITAL_COMMUNITY): Payer: Medicare Other

## 2020-02-10 ENCOUNTER — Other Ambulatory Visit: Payer: Self-pay | Admitting: Cardiology

## 2020-03-07 ENCOUNTER — Ambulatory Visit (INDEPENDENT_AMBULATORY_CARE_PROVIDER_SITE_OTHER): Payer: Medicare Other | Admitting: Internal Medicine

## 2020-03-07 ENCOUNTER — Encounter: Payer: Self-pay | Admitting: Internal Medicine

## 2020-03-07 ENCOUNTER — Other Ambulatory Visit: Payer: Self-pay

## 2020-03-07 VITALS — BP 118/76 | HR 90 | Temp 98.3°F | Ht 66.0 in | Wt 195.6 lb

## 2020-03-07 DIAGNOSIS — K921 Melena: Secondary | ICD-10-CM | POA: Insufficient documentation

## 2020-03-07 LAB — CBC
HCT: 43.8 % (ref 39.0–52.0)
Hemoglobin: 14.7 g/dL (ref 13.0–17.0)
MCHC: 33.6 g/dL (ref 30.0–36.0)
MCV: 99.1 fl (ref 78.0–100.0)
Platelets: 255 10*3/uL (ref 150.0–400.0)
RBC: 4.42 Mil/uL (ref 4.22–5.81)
RDW: 13 % (ref 11.5–15.5)
WBC: 11.1 10*3/uL — ABNORMAL HIGH (ref 4.0–10.5)

## 2020-03-07 LAB — COMPREHENSIVE METABOLIC PANEL
ALT: 17 U/L (ref 0–53)
AST: 18 U/L (ref 0–37)
Albumin: 4.2 g/dL (ref 3.5–5.2)
Alkaline Phosphatase: 66 U/L (ref 39–117)
BUN: 17 mg/dL (ref 6–23)
CO2: 28 mEq/L (ref 19–32)
Calcium: 9.6 mg/dL (ref 8.4–10.5)
Chloride: 106 mEq/L (ref 96–112)
Creatinine, Ser: 0.92 mg/dL (ref 0.40–1.50)
GFR: 80.02 mL/min (ref >60.00)
Glucose, Bld: 93 mg/dL (ref 70–99)
Potassium: 4.4 mEq/L (ref 3.5–5.1)
Sodium: 139 mEq/L (ref 135–145)
Total Bilirubin: 1.4 mg/dL — ABNORMAL HIGH (ref 0.2–1.2)
Total Protein: 7.1 g/dL (ref 6.0–8.3)

## 2020-03-07 NOTE — Progress Notes (Signed)
   Subjective:   Patient ID: Blake [name], male    DOB: Nov 07, 1945, 74 y.o.   MRN: [id number]  HPI The patient is a 74 YO man coming in for concerns about blood when using the bathroom. Started within 24 hours ago with blood in toilet with using the bathroom. He did notice that the water was pink and blood with wiping. He has had this problem before but normally lasts only 1-2 times. This has continued since that time. He is using some toilet paper by anus in between and noticing some blood getting on the toilet paper. No leaking onto underwear. Denies diarrhea or constipation recently. Denies abdominal pain. Denies nausea or vomiting. Some queasy. Denies fevers or chills. Last colonoscopy Jan 2020 with several polyps, some diverticula. Does take eliquis for a fib and is taking without missing doses recently. Has been consistent with using bathroom since onset. Denies it speeding up or slowing down. Not passing any clots. The blood is red and no dark tarry bowel movements.  Review of Systems  Constitutional: Negative.   HENT: Negative.   Eyes: Negative.   Respiratory: Negative for cough, chest tightness and shortness of breath.   Cardiovascular: Negative for chest pain, palpitations and leg swelling.  Gastrointestinal: Positive for anal bleeding and blood in stool. Negative for abdominal distention, abdominal pain, constipation, diarrhea, nausea, rectal pain and vomiting.  Musculoskeletal: Negative.   Skin: Negative.   Neurological: Negative.   Psychiatric/Behavioral: Negative.     Objective:  Physical Exam Constitutional:      Appearance: He is well-developed.  HENT:     Head: Normocephalic and atraumatic.  Cardiovascular:     Rate and Rhythm: Normal rate and regular rhythm.  Pulmonary:     Effort: Pulmonary effort is normal. No respiratory distress.     Breath sounds: Normal breath sounds. No wheezing or rales.  Abdominal:     General: Bowel sounds are normal. There is no  distension.     Palpations: Abdomen is soft.     Tenderness: There is no abdominal tenderness. There is no rebound.  Musculoskeletal:     Cervical back: Normal range of motion.  Skin:    General: Skin is warm and dry.  Neurological:     Mental Status: He is alert and oriented to person, place, and time.     Coordination: Coordination normal.     Vitals:   03/07/20 1401  BP: 118/76  Pulse: 90  Temp: 98.3 F (36.8 C)  SpO2: 98%  Weight: 195 lb 9.6 oz (88.7 kg)  Height: 5\' 6"  (1.676 m)    This visit occurred during the SARS-CoV-2 public health emergency.  Safety protocols were in place, including screening questions prior to the visit, additional usage of staff PPE, and extensive cleaning of exam room while observing appropriate contact time as indicated for disinfecting solutions.   Assessment & Plan:

## 2020-03-07 NOTE — Patient Instructions (Signed)
We will check labs today and call us back if needed.

## 2020-03-07 NOTE — Assessment & Plan Note (Signed)
Checking CBC and CMP. We talked about most likely diverticular bleed. If bleeding slowing or stopping will monitor closely. If not improving or if CBC with low Hg will advise seeking care at hospital.

## 2020-03-30 ENCOUNTER — Other Ambulatory Visit: Payer: Self-pay | Admitting: Cardiology

## 2020-04-01 ENCOUNTER — Telehealth: Payer: Self-pay | Admitting: Internal Medicine

## 2020-04-01 NOTE — Progress Notes (Signed)
  Chronic Care Management   Note  04/01/2020 Name: Blake [name] MRN: [id number] DOB: 05-31-1946  Blake [name] is a 74 y.o. year old male who is a primary care patient of Janith Lima, MD. I reached out to [name] by phone today in response to a referral sent by Blake [name]'s PCP, Janith Lima, MD.   Blake [name] was given information about Chronic Care Management services today including:  1. CCM service includes personalized support from designated clinical staff supervised by his physician, including individualized plan of care and coordination with other care providers 2. 24/7 contact phone numbers for assistance for urgent and routine care needs. 3. Service will only be billed when office clinical staff spend 20 minutes or more in a month to coordinate care. 4. Only one practitioner may furnish and bill the service in a calendar month. 5. The patient may stop CCM services at any time (effective at the end of the month) by phone call to the office staff.   Patient agreed to services and verbal consent obtained.  This note is not being shared with the patient for the following reason: To respect privacy (The patient or proxy has requested that the information not be shared). Follow up plan:   [name] Scheduler

## 2020-06-07 ENCOUNTER — Other Ambulatory Visit: Payer: Self-pay

## 2020-06-07 ENCOUNTER — Ambulatory Visit (INDEPENDENT_AMBULATORY_CARE_PROVIDER_SITE_OTHER): Payer: Medicare Other | Admitting: Internal Medicine

## 2020-06-07 ENCOUNTER — Encounter: Payer: Self-pay | Admitting: Internal Medicine

## 2020-06-07 VITALS — BP 130/60 | HR 75 | Temp 98.7°F | Resp 16 | Ht 66.0 in | Wt 196.4 lb

## 2020-06-07 DIAGNOSIS — Z23 Encounter for immunization: Secondary | ICD-10-CM

## 2020-06-07 DIAGNOSIS — I5032 Chronic diastolic (congestive) heart failure: Secondary | ICD-10-CM | POA: Diagnosis not present

## 2020-06-07 DIAGNOSIS — I482 Chronic atrial fibrillation, unspecified: Secondary | ICD-10-CM

## 2020-06-07 MED ORDER — SHINGRIX 50 MCG/0.5ML IM SUSR: 0.5000 mL | Freq: Once | INTRAMUSCULAR | 1 refills | Status: AC

## 2020-06-07 NOTE — Patient Instructions (Signed)

## 2020-06-07 NOTE — Progress Notes (Signed)
Subjective:  Patient ID: Blake [name], male    DOB: 03-21-1946  Age: 74 y.o. MRN: [id number]  CC: Atrial Fibrillation  This visit occurred during the SARS-CoV-2 public health emergency.  Safety protocols were in place, including screening questions prior to the visit, additional usage of staff PPE, and extensive cleaning of exam room while observing appropriate contact time as indicated for disinfecting solutions.    HPI Blake [name] presents for f/up - He underwent a NCS/EMG about 5 months ago.  The studies were consistent with cervical radiculopathy and carpal tunnel syndrome.  He tells me he is not symptomatic with either 1 of these findings and does not believe that the results are accurate.  He denies neck pain.  He feels well today and offers no complaints.  Outpatient Medications Prior to Visit  Medication Sig Dispense Refill   alfuzosin (UROXATRAL) 10 MG 24 hr tablet Take 10 mg by mouth daily with breakfast.     atorvastatin (LIPITOR) 80 MG tablet Take 1 tablet (80 mg total) by mouth daily at 6 PM. 30 tablet 6   ELIQUIS 5 MG TABS tablet TAKE ONE TABLET BY MOUTH TWICE A DAY 180 tablet 1   metoprolol tartrate (LOPRESSOR) 50 MG tablet TAKE ONE TABLET BY MOUTH TWICE A DAY 180 tablet 2   polyethylene glycol powder (GLYCOLAX/MIRALAX) 17 GM/SCOOP powder Take 255 g by mouth daily. 255 g 3   No facility-administered medications prior to visit.    ROS Review of Systems  Constitutional: Negative.  Negative for appetite change, chills, diaphoresis, fatigue and fever.  HENT: Negative.   Eyes: Negative.   Respiratory: Negative.  Negative for cough, chest tightness, shortness of breath and wheezing.   Cardiovascular: Negative for chest pain, palpitations and leg swelling.  Gastrointestinal: Negative for abdominal pain, constipation, diarrhea, nausea and vomiting.  Endocrine: Negative.   Genitourinary: Negative.  Negative for difficulty urinating.  Musculoskeletal: Negative for  arthralgias, back pain, myalgias and neck pain.  Skin: Negative.   Neurological: Negative for dizziness, weakness, light-headedness and numbness.  Hematological: Negative for adenopathy. Does not bruise/bleed easily.  Psychiatric/Behavioral: Negative.     Objective:  BP 130/60 (BP Location: Left Arm, Patient Position: Sitting, Cuff Size: Normal)    Pulse 75    Temp 98.7 F (37.1 C) (Oral)    Resp 16    Ht 5\' 6"  (1.676 m)    Wt 196 lb 6 oz (89.1 kg)    SpO2 96%    BMI 31.70 kg/m   BP Readings from Last 3 Encounters:  06/07/20 130/60  03/07/20 118/76  01/20/20 106/64    Wt Readings from Last 3 Encounters:  06/07/20 196 lb 6 oz (89.1 kg)  03/07/20 195 lb 9.6 oz (88.7 kg)  01/20/20 201 lb (91.2 kg)    Physical Exam Vitals reviewed.  Constitutional:      Appearance: Normal appearance.  HENT:     Nose: Nose normal.     Mouth/Throat:     Mouth: Mucous membranes are moist.  Eyes:     General: No scleral icterus.    Conjunctiva/sclera: Conjunctivae normal.  Cardiovascular:     Rate and Rhythm: Normal rate. Rhythm irregularly irregular.     Heart sounds: No murmur heard.  No gallop.   Pulmonary:     Effort: Pulmonary effort is normal.     Breath sounds: No stridor. No wheezing, rhonchi or rales.  Abdominal:     General: Abdomen is flat. Bowel sounds are normal.  Palpations: There is no hepatomegaly, splenomegaly or mass.     Tenderness: There is no abdominal tenderness.  Musculoskeletal:        General: Normal range of motion.     Cervical back: Neck supple.  Lymphadenopathy:     Cervical: No cervical adenopathy.  Skin:    General: Skin is warm and dry.  Neurological:     General: No focal deficit present.     Mental Status: He is alert.  Psychiatric:        Mood and Affect: Mood normal.        Behavior: Behavior normal.     Lab Results  Component Value Date   WBC 11.1 (H) 03/07/2020   HGB 14.7 03/07/2020   HCT 43.8 03/07/2020   PLT 255.0 03/07/2020    GLUCOSE 93 03/07/2020   CHOL 98 12/09/2019   TRIG 61.0 12/09/2019   HDL 39.60 12/09/2019   LDLCALC 47 12/09/2019   ALT 17 03/07/2020   AST 18 03/07/2020   NA 139 03/07/2020   K 4.4 03/07/2020   CL 106 03/07/2020   CREATININE 0.92 03/07/2020   BUN 17 03/07/2020   CO2 28 03/07/2020   TSH 0.87 12/09/2019   PSA 5.6 12/09/2019   INR 1.05 01/29/2017   HGBA1C 5.8 11/07/2015    US Abdomen Complete  Result Date: 01/26/2020 CLINICAL DATA:  Abdominal bloating, constipation EXAM: ABDOMEN ULTRASOUND COMPLETE COMPARISON:  None. FINDINGS: Gallbladder: No gallstones or wall thickening visualized. No sonographic Murphy sign noted by sonographer. Common bile duct: Diameter: 6 mm Liver: No focal lesion identified. Within normal limits in parenchymal echogenicity. Portal vein is patent on color Doppler imaging with normal direction of blood flow towards the liver. IVC: Obscured by bowel gas. Pancreas: Obscured by bowel gas. Spleen: Poorly visualized, partially obscured by bowel gas. Right Kidney: Length: 10.7 cm. Echogenicity within normal limits. No mass or hydronephrosis visualized. Left Kidney: Length: 11.0 cm. Echogenicity within normal limits. No mass or hydronephrosis visualized. Abdominal aorta: Obscured by bowel gas. Other findings: None. IMPRESSION: Examination is very limited by bowel gas throughout. Within this limitation, no ultrasound findings to explain constipation or bloating. Electronically Signed   By: Eddie Candle M.D.   On: 01/26/2020 16:35    12/15/19  NCV & EMG Findings:  Extensive electrodiagnostic testing of the right upper extremity  and additional studies of the left shows:  1. Left median sensory response shows prolonged latency (4.0 ms)  and reduced amplitude (8.4 V). Right median, right mixed  palmar, and bilateral ulnar sensory responses are within normal  limits.  2. Left median motor response shows prolonged latency (4.5 ms).   Right median and bilateral ulnar motor  responses are within  normal limits.   3. Chronic motor axonal loss changes are seen affecting the right  biceps, right deltoid, and the left abductor pollicis brevis  muscles, without accompanied active denervation.    Impression:  1. Chronic C5-6 radiculopathy affecting the right upper  extremity, mild-to-moderate.  2. Left median neuropathy at or distal to the wrist (moderate),  consistent with a clinical diagnosis of carpal tunnel syndrome.   3. There is no evidence of right carpal tunnel syndrome or  cervical radiculopathy affecting the left upper extremity.    Assessment & Plan:   Luisdaniel was seen today for atrial fibrillation.  Diagnoses and all orders for this visit:  Need for Tdap vaccination -     Tdap vaccine greater than or equal to 7yo IM  Need  for shingles vaccine -     Zoster Vaccine Adjuvanted [facility]) injection; Inject 0.5 mLs into the muscle once for 1 dose.  Atrial fibrillation, chronic (Startex)- He has good rate control.  Will continue anticoagulation with the DOAC. -     Referral to Chronic Care Management Services  Diastolic dysfunction with chronic heart failure [facility])- He is asymptomatic.  His blood pressure is well controlled.  There is no evidence of fluid overload.  Treatment is not indicated.   I am having Blake Bash "Ken" start on Shingrix. I am also having him maintain his alfuzosin, atorvastatin, polyethylene glycol powder, Eliquis, and metoprolol tartrate.  Meds ordered this encounter  Medications   Zoster Vaccine Adjuvanted [facility]) injection    Sig: Inject 0.5 mLs into the muscle once for 1 dose.    Dispense:  0.5 mL    Refill:  1     Follow-up: Return in about 6 months (around 12/08/2020).  Scarlette Calico, MD

## 2020-06-10 ENCOUNTER — Other Ambulatory Visit: Payer: Self-pay

## 2020-06-10 ENCOUNTER — Ambulatory Visit: Payer: Medicare Other | Admitting: Pharmacist

## 2020-06-10 DIAGNOSIS — I482 Chronic atrial fibrillation, unspecified: Secondary | ICD-10-CM

## 2020-06-10 DIAGNOSIS — N3281 Overactive bladder: Secondary | ICD-10-CM

## 2020-06-10 DIAGNOSIS — I5032 Chronic diastolic (congestive) heart failure: Secondary | ICD-10-CM

## 2020-06-10 DIAGNOSIS — I2583 Coronary atherosclerosis due to lipid rich plaque: Secondary | ICD-10-CM

## 2020-06-10 DIAGNOSIS — N400 Enlarged prostate without lower urinary tract symptoms: Secondary | ICD-10-CM

## 2020-06-10 DIAGNOSIS — E785 Hyperlipidemia, unspecified: Secondary | ICD-10-CM

## 2020-06-10 DIAGNOSIS — I2510 Atherosclerotic heart disease of native coronary artery without angina pectoris: Secondary | ICD-10-CM

## 2020-06-10 NOTE — Patient Instructions (Addendum)
Visit Information  Phone number for Pharmacist: [phone]  Thank you for meeting with me to discuss your medications! I look forward to working with you to achieve your health care goals. Below is a summary of what we talked about during the visit:  Goals Addressed            This Visit's Progress   . Pharmacy Care Plan       CARE PLAN ENTRY (see longitudinal plan of care for additional care plan information)  Current Barriers:  . Chronic Disease Management support, education, and care coordination needs related to Hyperlipidemia, Atrial Fibrillation, Heart Failure, Coronary Artery Disease, Overactive Bladder, and BPH   Atrial Fibrillation / Heart Failure BP Readings from Last 3 Encounters:  06/07/20 130/60  03/07/20 118/76  01/20/20 106/64 .  Pharmacist Clinical Goal(s): o Over the next 60 days, patient will work with PharmD and providers to maintain BP goal <130/80 . Current regimen:  o Metoprolol tartrate 50 mg twice a day o Eliquis 5 mg twice a day . Interventions: o Discussed benefits of metoprolol for maintaining normal sinus rhythm, and benefits of Eliquis for stroke prevention . Patient self care activities - Over the next 60 days, patient will: o Continue medications as prescribed o Monitor for signs/symptoms of bleeding  Hyperlipidemia / Coronary artery disease Lab Results  Component Value Date/Time   LDLCALC 47 12/09/2019 10:50 AM .  Pharmacist Clinical Goal(s): o Over the next 60 days, patient will work with PharmD and providers to maintain LDL goal < 70 . Current regimen:  o Atorvastatin 80 mg daily . Interventions: o Discussed potential for atorvastatin to cause muscle aches; advised trial off atorvastatin when muscle aches are present to see if they improve; if muscle aches improve off of atorvastatin, we can reduce dose or switch to rosuvastatin. If there is no improvement, resume atorvastatin and pursue other explanations/treatments for muscle  aches . Patient self care activities - Over the next 60 days, patient will: o Contact pharmacist when starting trial off of atorvastatin  Overactive bladder / BPH . Pharmacist Clinical Goal(s) o Over the next 60 days, patient will work with PharmD and providers to optimize therapy . Current regimen:  o Alfuzosin 10 mg daily . Interventions: o Provided preferred options via [place], all are Tier 3 ($37/month) - Myrbetriq, Tolterodine ER 4 mg, Toviaz, Trospium o Discussed Myrbetriq has a unique mechanism and may offer benefit where others have failed . Patient self care activities - Over the next 60 days, patient will: o Bring preferred formulary agents to urologist  Medication management . Pharmacist Clinical Goal(s): o Over the next 60 days, patient will work with PharmD and providers to maintain optimal medication adherence . [facility] . Interventions o Comprehensive medication review performed. o Continue current medication management strategy . Patient self care activities - Over the next 60 days, patient will: o Focus on medication adherence by fill date o Take medications as prescribed o Report any questions or concerns to PharmD and/or provider(s)  Initial goal documentation      Mr. Marchant was given information about Chronic Care Management services today including:  1. CCM service includes personalized support from designated clinical staff supervised by his physician, including individualized plan of care and coordination with other care providers 2. 24/7 contact phone numbers for assistance for urgent and routine care needs. 3. Standard insurance, coinsurance, copays and deductibles apply for chronic care management only during months in which we provide at  least 20 minutes of these services. Most insurances cover these services at 100%, however patients may be responsible for any copay, coinsurance and/or deductible if applicable. This  service may help you avoid the need for more expensive face-to-face services. 4. Only one practitioner may furnish and bill the service in a calendar month. 5. The patient may stop CCM services at any time (effective at the end of the month) by phone call to the office staff.  Patient agreed to services and verbal consent obtained.   The patient verbalized understanding of instructions provided today and agreed to receive a mailed copy of patient instruction and/or educational materials. Telephone follow up appointment with pharmacy team member scheduled for: 2 months  Charlene Brooke, PharmD, BCACP Clinical Pharmacist [facility] After Age 26 After age 61, you are at a higher risk for certain long-term diseases and infections as well as injuries from falls. Falls are a major cause of broken bones and head injuries in people who are older than age 34. Getting regular preventive care can help to keep you healthy and well. Preventive care includes getting regular testing and making lifestyle changes as recommended by your health care provider. Talk with your health care provider about:  Which screenings and tests you should have. A screening is a test that checks for a disease when you have no symptoms.  A diet and exercise plan that is right for you. What should I know about screenings and tests to prevent falls? Screening and testing are the best ways to find a health problem early. Early diagnosis and treatment give you the best chance of managing medical conditions that are common after age 50. Certain conditions and lifestyle choices may make you more likely to have a fall. Your health care provider may recommend:  Regular vision checks. Poor vision and conditions such as cataracts can make you more likely to have a fall. If you wear glasses, make sure to get your prescription updated if your vision changes.  Medicine review.  Work with your health care provider to regularly review all of the medicines you are taking, including over-the-counter medicines. Ask your health care provider about any side effects that may make you more likely to have a fall. Tell your health care provider if any medicines that you take make you feel dizzy or sleepy.  Osteoporosis screening. Osteoporosis is a condition that causes the bones to get weaker. This can make the bones weak and cause them to break more easily.  Blood pressure screening. Blood pressure changes and medicines to control blood pressure can make you feel dizzy.  Strength and balance checks. Your health care provider may recommend certain tests to check your strength and balance while standing, walking, or changing positions.  Foot health exam. Foot pain and numbness, as well as not wearing proper footwear, can make you more likely to have a fall.  Depression screening. You may be more likely to have a fall if you have a fear of falling, feel emotionally low, or feel unable to do activities that you used to do.  Alcohol use screening. Using too much alcohol can affect your balance and may make you more likely to have a fall. What actions can I take to lower my risk of falls? General instructions  Talk with your health care provider about your risks for falling. Tell your health care provider if: ? You fall. Be sure to tell your health care provider  about all falls, even ones that seem minor. ? You feel dizzy, sleepy, or off-balance.  Take over-the-counter and prescription medicines only as told by your health care provider. These include any supplements.  Eat a healthy diet and maintain a healthy weight. A healthy diet includes low-fat dairy products, low-fat (lean) meats, and fiber from whole grains, beans, and lots of fruits and vegetables. Home safety  Remove any tripping hazards, such as rugs, cords, and clutter.  Install safety equipment such as grab bars in  bathrooms and safety rails on stairs.  Keep rooms and walkways well-lit. Activity   Follow a regular exercise program to stay fit. This will help you maintain your balance. Ask your health care provider what types of exercise are appropriate for you.  If you need a cane or walker, use it as recommended by your health care provider.  Wear supportive shoes that have nonskid soles. Lifestyle  Do not drink alcohol if your health care provider tells you not to drink.  If you drink alcohol, limit how much you have: ? 0-1 drink a day for women. ? 0-2 drinks a day for men.  Be aware of how much alcohol is in your drink. In the [place] one drink equals one typical bottle of beer (12 oz), one-half glass of wine (5 oz), or one shot of hard liquor (1 oz).  Do not use any products that contain nicotine or tobacco, such as cigarettes and e-cigarettes. If you need help quitting, ask your health care provider. Summary  Having a healthy lifestyle and getting preventive care can help to protect your health and wellness after age 87.  Screening and testing are the best way to find a health problem early and help you avoid having a fall. Early diagnosis and treatment give you the best chance for managing medical conditions that are more common for people who are older than age 51.  Falls are a major cause of broken bones and head injuries in people who are older than age 63. Take precautions to prevent a fall at home.  Work with your health care provider to learn what changes you can make to improve your health and wellness and to prevent falls. This information is not intended to replace advice given to you by your health care provider. Make sure you discuss any questions you have with your health care provider. Document Revised: 01/29/2019 Document Reviewed: 08/21/2017 Elsevier Patient Education  2020 [employer].

## 2020-06-10 NOTE — Chronic Care Management (AMB) (Signed)
Chronic Care Management Pharmacy  Name: Blake [name]  MRN: [id number] DOB: [birth year]   Chief Complaint/ HPI  Blake [name],  74 y.o. , male presents for their Initial CCM visit with the clinical pharmacist via telephone due to COVID-19 Pandemic  PCP : Janith Lima, MD Patient Care Team: Janith Lima, MD as PCP - General (Internal Medicine) Stanford Breed Denice Bors, MD as PCP - Cardiology (Cardiology) Hillary Bow, MD (Cardiology) Kennith Center, RD as Dietitian (Family Medicine) Charlton Haws, [facility] as Pharmacist (Pharmacist)  Born and raised in [place]. 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 4 children, daughter in [place]. Son in [place] (anestheiologist). Son in [place] works for [employer]. Son in [place] working for [employer].    Their chronic conditions include: Hyperlipidemia, Atrial Fibrillation, Heart Failure, Coronary Artery Disease, Osteoarthritis, Overactive Bladder and BPH   Office Visits: 06/07/20 Dr Ronnald Ramp OV: Shingrix vaccine given.  03/07/20 Dr Sharlet Salina OV: acute visit for blood in stool. CBC normal, will monitor.  12/09/19 Dr Ronnald Ramp OV: chronic f/u. C/o arm numbness/tingling x 6 mos. Normal B12. Rec'd nerve conduction study - showed cervice radiculopathy and carpal tunnel.  Consult Visit: 01/20/20 Dr Henrene Pastor (GI): abdominal bloating - rx'd Miralax. Abd [place] limited by by bowel gas, negative for acute issues that would cause bloating.  12/08/19 Dr Stanford Breed (cardiology): f/u for CAD, Afib, MR - stable, no med changes. No aspirin d/t Eliquis.  No Known Allergies  Medications: Outpatient Encounter Medications as of 06/10/2020  Medication Sig  . alfuzosin (UROXATRAL) 10 MG 24 hr tablet Take 10 mg by mouth daily with breakfast.  . atorvastatin (LIPITOR) 80 MG tablet Take 1 tablet (80 mg total) by mouth daily at 6 PM.  . ELIQUIS 5 MG TABS tablet TAKE ONE TABLET BY MOUTH TWICE A DAY  . metoprolol tartrate (LOPRESSOR) 50 MG tablet TAKE ONE TABLET BY MOUTH  TWICE A DAY  . polyethylene glycol powder (GLYCOLAX/MIRALAX) 17 GM/SCOOP powder Take 255 g by mouth daily.   No facility-administered encounter medications on file as of 06/10/2020.     Current Diagnosis/Assessment:  SDOH Interventions     Most Recent Value  SDOH Interventions  Financial Strain Interventions Intervention Not Indicated      Goals Addressed            This Visit's Progress   . Pharmacy Care Plan       CARE PLAN ENTRY (see longitudinal plan of care for additional care plan information)  Current Barriers:  . Chronic Disease Management support, education, and care coordination needs related to Hyperlipidemia, Atrial Fibrillation, Heart Failure, Coronary Artery Disease, Overactive Bladder, and BPH   Atrial Fibrillation / Heart Failure BP Readings from Last 3 Encounters:  06/07/20 130/60  03/07/20 118/76  01/20/20 106/64 .  Pharmacist Clinical Goal(s): o Over the next 60 days, patient will work with PharmD and providers to maintain BP goal <130/80 . Current regimen:  o Metoprolol tartrate 50 mg twice a day o Eliquis 5 mg twice a day . Interventions: o Discussed benefits of metoprolol for maintaining normal sinus rhythm, and benefits of Eliquis for stroke prevention . Patient self care activities - Over the next 60 days, patient will: o Continue medications as prescribed o Monitor for signs/symptoms of bleeding  Hyperlipidemia / Coronary artery disease Lab Results  Component Value Date/Time   LDLCALC 47 12/09/2019 10:50 AM .  Pharmacist Clinical Goal(s): o Over the next 60 days, patient will work with PharmD and providers  to maintain LDL goal < 70 . Current regimen:  o Atorvastatin 80 mg daily . Interventions: o Discussed potential for atorvastatin to cause muscle aches; advised trial off atorvastatin when muscle aches are present to see if they improve; if muscle aches improve off of atorvastatin, we can reduce dose or switch to rosuvastatin. If there is  no improvement, resume atorvastatin and pursue other explanations/treatments for muscle aches . Patient self care activities - Over the next 60 days, patient will: o Contact pharmacist when starting trial off of atorvastatin  Overactive bladder / BPH . Pharmacist Clinical Goal(s) o Over the next 60 days, patient will work with PharmD and providers to optimize therapy . Current regimen:  o Alfuzosin 10 mg daily . Interventions: o Provided preferred options via [place], all are Tier 3 ($37/month) - Myrbetriq, Tolterodine ER 4 mg, Toviaz, Trospium o Discussed Myrbetriq has a unique mechanism and may offer benefit where others have failed . Patient self care activities - Over the next 60 days, patient will: o Bring preferred formulary agents to urologist  Medication management . Pharmacist Clinical Goal(s): o Over the next 60 days, patient will work with PharmD and providers to maintain optimal medication adherence . [facility] . Interventions o Comprehensive medication review performed. o Continue current medication management strategy . Patient self care activities - Over the next 60 days, patient will: o Focus on medication adherence by fill date o Take medications as prescribed o Report any questions or concerns to PharmD and/or provider(s)  Initial goal documentation       AFIB   Patient is currently rate controlled. Office heart rates are  Pulse Readings from Last 3 Encounters:  06/07/20 75  03/07/20 90  01/20/20 80   CHA2DS2-VASc Score = 5 The patient's score is based upon: CHF History: 1 HTN History: 1 Age : 2 Diabetes History: 0 Stroke History: 0 Vascular Disease History: 1 Gender: 0   Kidney Function Lab Results  Component Value Date/Time   CREATININE 0.92 03/07/2020 02:28 PM   CREATININE 0.80 12/09/2019 10:50 AM   CREATININE 0.92 02/11/2017 04:09 PM   CREATININE 0.69 06/10/2014 09:13 AM   GFR 80.02 03/07/2020 02:28 PM    EAVWUJWJ 19 02/20/2018 09:03 AM   GFRNONAA >89 06/10/2014 09:13 AM   GFRAA 104 02/20/2018 09:03 AM   GFRAA >89 06/10/2014 09:13 AM   K 4.4 03/07/2020 02:28 PM   K 4.1 12/09/2019 10:50 AM   Patient has failed these meds in past: n/a Patient is currently controlled on the following medications:  [name] Metoprolol tartrate 50 mg BID . Eliquis 5 mg BID   We discussed:  Role of beta blockers in Afib; benefits of Eliquis for stroke prevention; pt denies bleeding issues  Plan  Continue current medications  Heart Failure   Type: Diastolic  Last ejection fraction 12/28/19: 55-60%  BP goal is:  <130/80 BP Readings from Last 3 Encounters:  06/07/20 130/60  03/07/20 118/76  01/20/20 106/64   Patient has failed these meds in past: n/a Patient is currently controlled on the following medications:  [name] Metoprolol tartrate 25 mg BID  We discussed: pt does not endorse swelling/volume abnormalities  Plan  Continue current medications   Hyperlipidemia / CAD   LDL goal < 70 S/P CABG x3, (03/27/11) Dr Tharon Aquas Trigt    Lipid Panel     Component Value Date/Time   CHOL 98 12/09/2019 1050   TRIG 61.0 12/09/2019 1050   HDL 39.60 12/09/2019  [place] 12/09/2019 1050    Hepatic Function Latest Ref Rng & Units 03/07/2020 12/09/2019 10/23/2018  Total Protein 6.0 - 8.3 g/dL 7.1 7.0 6.8  Albumin 3.5 - 5.2 g/dL 4.2 4.1 3.9  AST 0 - 37 U/L _0 ALT 0 - 53 U/L _1 Alk Phosphatase 39 - 117 U/L 66 67 66  Total Bilirubin 0.2 - 1.2 mg/dL 1.4(H) 1.0 0.6  Bilirubin, Direct 0.0 - 0.3 mg/dL - 0.2 -     The ASCVD Risk score (Harris., et al., 2013) failed to calculate for the following reasons:   The valid total cholesterol range is 130 to 320 mg/dL   Patient has failed these meds in past: n/a Patient is currently controlled on the following medications:  . Atorvastatin 80 mg daily  We discussed:  Benefits of statin for secondary ASCVD risk reduction; pt endorses intermittent  muscle cramps in legs for years; concern that high dose atorvastatin may be contributing; benefits of 2-week trial off of atorvastatin to see if cramps improve; pt is not currently experiencing leg cramps, so a trial off right now would not help; pt will contact me when he has leg cramps again and will discuss trial off at that point; if statin is causing cramps, options include reducing dose or changing to rosuvastatin  Plan  Continue current medications  Pt will attempt trial off atorvastatin if leg cramps return  BPH / OAB   PSA  Date Value Ref Range Status  12/09/2019 5.6  Final  10/23/2018 5.3  Final  07/17/2018 6.23 (H) 0.10 - 4.00 ng/mL Final    Comment:    Test performed using Access Hybritech PSA Assay, a parmagnetic partical, chemiluminecent immunoassay.    Patient has failed these meds in past: tamsulosin, Rapaflo Patient is currently uncontrolled on the following medications:  . Alfuzosin 10 mg daily   We discussed:  Pt reports the alfuzosin is "not working", he plans to f/u with urologist to discuss options for overactive bladder. Provided patient with preferred formulary options: Myrbetriq, Tolterodine ER 4 mg, Toviaz, and Trospium are all Tier 3 ($37/month)  Plan  Continue current medications  Follow up with urologist as scheduled  Chronic idiopathic constipation   Patient has failed these meds in past: n/a Patient is currently controlled on the following medications:  [name] Miralax PRN  We discussed:  Pt uses as needed, denies issues currently  Plan  Continue current medications  Medication Management   Pt uses [facility] for all medications Uses pill box? Yes Pt endorses 95% compliance  We discussed: Pt is satisfied with pharmacy services   Plan  Continue current medication management strategy    Follow up: 2 month phone visit  Charlene Brooke, PharmD, BCACP Clinical Pharmacist [facility] [phone]

## 2020-06-30 DIAGNOSIS — Z0120 Encounter for dental examination and cleaning without abnormal findings: Secondary | ICD-10-CM | POA: Diagnosis not present

## 2020-08-07 ENCOUNTER — Other Ambulatory Visit: Payer: Self-pay | Admitting: Cardiology

## 2020-08-08 ENCOUNTER — Telehealth: Payer: Medicare Other

## 2020-08-08 NOTE — Telephone Encounter (Signed)
Prescription refill request for Eliquis received. Indication:Atrial Fibrillation Last office visit: 11/2019  [place] Scr: 0.92  02/2020 Age:74  Weight: 89.1 kg  Prescription refilled

## 2020-08-12 ENCOUNTER — Telehealth: Payer: Medicare Other

## 2020-08-12 NOTE — Chronic Care Management (AMB) (Deleted)
Chronic Care Management Pharmacy  Name: Blake [name]  MRN: [id number] DOB: 01/10/1946   Chief Complaint/ HPI  Blake [name],  74 y.o. , male presents for their Follow-Up CCM visit with the clinical pharmacist via telephone due to COVID-19 Pandemic  PCP : Janith Lima, MD Patient Care Team: Janith Lima, MD as PCP - General (Internal Medicine) Stanford Breed Denice Bors, MD as PCP - Cardiology (Cardiology) Hillary Bow, MD (Cardiology) Kennith Center, RD as Dietitian (Family Medicine) Charlton Haws, [facility] as Pharmacist (Pharmacist)  Born and raised in [place]. 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 4 children, daughter in [place]. Son in [place] (anestheiologist). Son in [place] works for [employer]. Son in [place] working for [employer].    Their chronic conditions include: Hyperlipidemia, Atrial Fibrillation, Heart Failure, Coronary Artery Disease, Osteoarthritis, Overactive Bladder and BPH   Office Visits: 06/07/20 Dr Ronnald Ramp OV: Shingrix vaccine given.  03/07/20 Dr Sharlet Salina OV: acute visit for blood in stool. CBC normal, will monitor.  12/09/19 Dr Ronnald Ramp OV: chronic f/u. C/o arm numbness/tingling x 6 mos. Normal B12. Rec'd nerve conduction study - showed cervice radiculopathy and carpal tunnel.  Consult Visit: 01/20/20 Dr Henrene Pastor (GI): abdominal bloating - rx'd Miralax. Abd [place] limited by by bowel gas, negative for acute issues that would cause bloating.  12/08/19 Dr Stanford Breed (cardiology): f/u for CAD, Afib, MR - stable, no med changes. No aspirin d/t Eliquis.  No Known Allergies  Medications: Outpatient Encounter Medications as of 08/12/2020  Medication Sig  . alfuzosin (UROXATRAL) 10 MG 24 hr tablet Take 10 mg by mouth daily with breakfast.  . atorvastatin (LIPITOR) 80 MG tablet Take 1 tablet (80 mg total) by mouth daily at 6 PM.  . ELIQUIS 5 MG TABS tablet TAKE ONE TABLET BY MOUTH TWICE A DAY  . metoprolol tartrate (LOPRESSOR) 50 MG tablet TAKE ONE TABLET BY  MOUTH TWICE A DAY  . polyethylene glycol powder (GLYCOLAX/MIRALAX) 17 GM/SCOOP powder Take 255 g by mouth daily.   No facility-administered encounter medications on file as of 08/12/2020.   Wt Readings from Last 3 Encounters:  06/07/20 196 lb 6 oz (89.1 kg)  03/07/20 195 lb 9.6 oz (88.7 kg)  01/20/20 201 lb (91.2 kg)   Current Diagnosis/Assessment:    Goals Addressed   None     AFIB   Patient is currently rate controlled. Office heart rates are  Pulse Readings from Last 3 Encounters:  06/07/20 75  03/07/20 90  01/20/20 80   CHA2DS2-VASc Score = 5 The patient's score is based upon: CHF History: 1 HTN History: 1 Age : 2 Diabetes History: 0 Stroke History: 0 Vascular Disease History: 1 Gender: 0   Kidney Function Lab Results  Component Value Date/Time   CREATININE 0.92 03/07/2020 02:28 PM   CREATININE 0.80 12/09/2019 10:50 AM   CREATININE 0.92 02/11/2017 04:09 PM   CREATININE 0.69 06/10/2014 09:13 AM   GFR 80.02 03/07/2020 02:28 PM   UVOZDGUY 40 02/20/2018 09:03 AM   GFRNONAA >89 06/10/2014 09:13 AM   GFRAA 104 02/20/2018 09:03 AM   GFRAA >89 06/10/2014 09:13 AM   K 4.4 03/07/2020 02:28 PM   K 4.1 12/09/2019 10:50 AM   Patient has failed these meds in past: n/a Patient is currently controlled on the following medications:  [name] Metoprolol tartrate 50 mg BID . Eliquis 5 mg BID   We discussed:  Role of beta blockers in Afib; benefits of Eliquis for stroke prevention; pt denies bleeding  issues  Plan  Continue current medications  Heart Failure   Type: Diastolic  Last ejection fraction 12/28/19: 55-60%  BP goal is:  <130/80 BP Readings from Last 3 Encounters:  06/07/20 130/60  03/07/20 118/76  01/20/20 106/64   Patient has failed these meds in past: n/a Patient is currently controlled on the following medications:  [name] Metoprolol tartrate 25 mg BID  We discussed: pt does not endorse swelling/volume abnormalities  Plan  Continue current  medications   Hyperlipidemia / CAD   LDL goal < 70 S/P CABG x3, (03/27/11) Dr Tharon Aquas Trigt    Lipid Panel     Component Value Date/Time   CHOL 98 12/09/2019 1050   TRIG 61.0 12/09/2019 1050   HDL 39.60 12/09/2019 1050   LDLCALC 47 12/09/2019 1050    Hepatic Function Latest Ref Rng & Units 03/07/2020 12/09/2019 10/23/2018  Total Protein 6.0 - 8.3 g/dL 7.1 7.0 6.8  Albumin 3.5 - 5.2 g/dL 4.2 4.1 3.9  AST 0 - 37 U/L $Remo'18 21 11  'siXFy$ ALT 0 - 53 U/L $Remo'17 22 9  'KxiwP$ Alk Phosphatase 39 - 117 U/L 66 67 66  Total Bilirubin 0.2 - 1.2 mg/dL 1.4(H) 1.0 0.6  Bilirubin, Direct 0.0 - 0.3 mg/dL - 0.2 -     The ASCVD Risk score ([place]an., et al., 2013) failed to calculate for the following reasons:   The valid total cholesterol range is 130 to 320 mg/dL   Patient has failed these meds in past: n/a Patient is currently controlled on the following medications:  . Atorvastatin 80 mg daily  We discussed:  Benefits of statin for secondary ASCVD risk reduction; pt endorses intermittent muscle cramps in legs for years; concern that high dose atorvastatin may be contributing; benefits of 2-week trial off of atorvastatin to see if cramps improve; pt is not currently experiencing leg cramps, so a trial off right now would not help; pt will contact me when he has leg cramps again and will discuss trial off at that point; if statin is causing cramps, options include reducing dose or changing to rosuvastatin  Plan  Continue current medications  Pt will attempt trial off atorvastatin if leg cramps return  BPH / OAB   PSA  Date Value Ref Range Status  12/09/2019 5.6  Final  10/23/2018 5.3  Final  07/17/2018 6.23 (H) 0.10 - 4.00 ng/mL Final    Comment:    Test performed using Access Hybritech PSA Assay, a parmagnetic partical, chemiluminecent immunoassay.    Patient has failed these meds in past: tamsulosin, Rapaflo Patient is currently uncontrolled on the following medications:  . Alfuzosin 10 mg daily   We  discussed:  Pt reports the alfuzosin is "not working", he plans to f/u with urologist to discuss options for overactive bladder. Provided patient with preferred formulary options: Myrbetriq, Tolterodine ER 4 mg, Toviaz, and Trospium are all Tier 3 ($37/month)  Plan  Continue current medications  Follow up with urologist as scheduled   Medication Management   Pt uses [place] for all medications Uses pill box? Yes Pt endorses 95% compliance  We discussed: Pt is satisfied with pharmacy services   Plan  Continue current medication management strategy    Follow up: *** month phone visit  Charlene Brooke, PharmD, BCACP Clinical Pharmacist [facility] at [facility] [phone]

## 2020-08-16 ENCOUNTER — Other Ambulatory Visit: Payer: Self-pay

## 2020-08-16 ENCOUNTER — Ambulatory Visit: Payer: Medicare Other | Admitting: Pharmacist

## 2020-08-16 DIAGNOSIS — I482 Chronic atrial fibrillation, unspecified: Secondary | ICD-10-CM

## 2020-08-16 DIAGNOSIS — E785 Hyperlipidemia, unspecified: Secondary | ICD-10-CM

## 2020-08-16 DIAGNOSIS — I5032 Chronic diastolic (congestive) heart failure: Secondary | ICD-10-CM

## 2020-08-16 DIAGNOSIS — N400 Enlarged prostate without lower urinary tract symptoms: Secondary | ICD-10-CM

## 2020-08-16 DIAGNOSIS — N3281 Overactive bladder: Secondary | ICD-10-CM

## 2020-08-16 NOTE — Chronic Care Management (AMB) (Signed)
Chronic Care Management Pharmacy  Name: Blake [name]  MRN: [id number] DOB: 1946/02/29   Chief Complaint/ HPI  Blake [name],  74 y.o. , male presents for their Follow-Up CCM visit with the clinical pharmacist via telephone due to COVID-19 Pandemic  PCP : Janith Lima, MD Patient Care Team: Janith Lima, MD as PCP - General (Internal Medicine) Stanford Breed Denice Bors, MD as PCP - Cardiology (Cardiology) Hillary Bow, MD (Cardiology) Kennith Center, RD as Dietitian (Family Medicine) Charlton Haws, [facility] as Pharmacist (Pharmacist)  Pt was born and raised in [place]. 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 He has been married 35 years. 4 children, daughter in [place]. Son in [place] (anestheiologist). Son in [place] works for [employer]. Son in [place] working for [employer].    Their chronic conditions include: Hyperlipidemia, Atrial Fibrillation, Heart Failure, Coronary Artery Disease, Osteoarthritis, Overactive Bladder and BPH   Office Visits: 06/07/20 Dr Ronnald Ramp OV: Shingrix vaccine given.  03/07/20 Dr Sharlet Salina OV: acute visit for blood in stool. CBC normal, will monitor.  12/09/19 Dr Ronnald Ramp OV: chronic f/u. C/o arm numbness/tingling x 6 mos. Normal B12. Rec'd nerve conduction study - showed cervice radiculopathy and carpal tunnel.  Consult Visit: 01/20/20 Dr Henrene Pastor (GI): abdominal bloating - rx'd Miralax. Abd [place] limited by by bowel gas, negative for acute issues that would cause bloating.  12/08/19 Dr Stanford Breed (cardiology): f/u for CAD, Afib, MR - stable, no med changes. No aspirin d/t Eliquis.  No Known Allergies  Medications: Outpatient Encounter Medications as of 08/16/2020  Medication Sig  . alfuzosin (UROXATRAL) 10 MG 24 hr tablet Take 10 mg by mouth daily with breakfast.  . atorvastatin (LIPITOR) 80 MG tablet Take 1 tablet (80 mg total) by mouth daily at 6 PM.  . ELIQUIS 5 MG TABS tablet TAKE ONE TABLET BY MOUTH TWICE A DAY  . metoprolol tartrate (LOPRESSOR) 50  MG tablet TAKE ONE TABLET BY MOUTH TWICE A DAY  . polyethylene glycol powder (GLYCOLAX/MIRALAX) 17 GM/SCOOP powder Take 255 g by mouth daily.   No facility-administered encounter medications on file as of 08/16/2020.   Wt Readings from Last 3 Encounters:  06/07/20 196 lb 6 oz (89.1 kg)  03/07/20 195 lb 9.6 oz (88.7 kg)  01/20/20 201 lb (91.2 kg)   Current Diagnosis/Assessment:    Goals Addressed            This Visit's Progress   . Pharmacy Care Plan       CARE PLAN ENTRY (see longitudinal plan of care for additional care plan information)  Current Barriers:  . Chronic Disease Management support, education, and care coordination needs related to Hyperlipidemia, Atrial Fibrillation, Heart Failure, Coronary Artery Disease, Overactive Bladder, and BPH   Atrial Fibrillation / Heart Failure BP Readings from Last 3 Encounters:  06/07/20 130/60  03/07/20 118/76  01/20/20 106/64 .  Pharmacist Clinical Goal(s): o Over the next 90 days, patient will work with PharmD and providers to maintain BP goal <130/80 . Current regimen:  o Metoprolol tartrate 50 mg twice a day o Eliquis 5 mg twice a day . Interventions: o Discussed benefits of metoprolol for maintaining normal sinus rhythm, and benefits of Eliquis for stroke prevention . Patient self care activities - Over the next 90 days, patient will: o Continue medications as prescribed o Monitor for signs/symptoms of bleeding  Hyperlipidemia / Coronary artery disease Lab Results  Component Value Date/Time   LDLCALC 47 12/09/2019 10:50 AM .  Pharmacist Clinical  Goal(s): o Over the next 90 days, patient will work with PharmD and providers to maintain LDL goal < 70 . Current regimen:  o Atorvastatin 80 mg daily . Interventions: o Discussed cholesterol goals and benefits of medications for prevention of heart attack / stroke o Trial off of atorvastatin yielded no change in muscle cramps; patient resumed statin . Patient self care  activities - Over the next 90 days, patient will: o Continue medication as directed  Overactive bladder / BPH . Pharmacist Clinical Goal(s) o Over the next 90 days, patient will work with PharmD and providers to optimize therapy . Current regimen:  o Alfuzosin 10 mg daily . Interventions: o Provided preferred options via [place], all are Tier 3 ($37/month) - Myrbetriq, Tolterodine ER 4 mg, Toviaz, Trospium o Discussed Myrbetriq has a unique mechanism and may offer benefit where others have failed . Patient self care activities - Over the next 90 days, patient will: o Bring preferred formulary agents to urologist  Medication management . Pharmacist Clinical Goal(s): o Over the next 90 days, patient will work with PharmD and providers to maintain optimal medication adherence . [facility] . Interventions o Comprehensive medication review performed. o Continue current medication management strategy . Patient self care activities - Over the next 90 days, patient will: o Focus on medication adherence by fill date o Take medications as prescribed o Report any questions or concerns to PharmD and/or provider(s)  Please see past updates related to this goal by clicking on the "Past Updates" button in the selected goal        AFIB   Patient is currently rate controlled. Office heart rates are  Pulse Readings from Last 3 Encounters:  06/07/20 75  03/07/20 90  01/20/20 80   CHA2DS2-VASc Score = 5 The patient's score is based upon: CHF History: 1 HTN History: 1 Age : 2 Diabetes History: 0 Stroke History: 0 Vascular Disease History: 1 Gender: 0   Kidney Function Lab Results  Component Value Date/Time   CREATININE 0.92 03/07/2020 02:28 PM   CREATININE 0.80 12/09/2019 10:50 AM   CREATININE 0.92 02/11/2017 04:09 PM   CREATININE 0.69 06/10/2014 09:13 AM   GFR 80.02 03/07/2020 02:28 PM   PRXYVOPF 29 02/20/2018 09:03 AM   GFRNONAA >89 06/10/2014  09:13 AM   GFRAA 104 02/20/2018 09:03 AM   GFRAA >89 06/10/2014 09:13 AM   K 4.4 03/07/2020 02:28 PM   K 4.1 12/09/2019 10:50 AM   Patient has failed these meds in past: n/a Patient is currently controlled on the following medications:  [name] Metoprolol tartrate 50 mg BID . Eliquis 5 mg BID   We discussed:  Role of beta blockers in Afib; benefits of Eliquis for stroke prevention; pt denies bleeding issues  Plan  Continue current medications  Heart Failure   Type: Diastolic  Last ejection fraction 12/28/19: 55-60%  BP goal is:  <130/80 BP Readings from Last 3 Encounters:  06/07/20 130/60  03/07/20 118/76  01/20/20 106/64   Patient has failed these meds in past: n/a Patient is currently controlled on the following medications:  [name] Metoprolol tartrate 25 mg BID  We discussed: pt does not endorse swelling/volume abnormalities  Plan  Continue current medications   Hyperlipidemia / CAD   LDL goal < 70 S/P CABG x3, (03/27/11) Dr Tharon Aquas Trigt    Lipid Panel     Component Value Date/Time   CHOL 98 12/09/2019 1050   TRIG 61.0 12/09/2019 1050  HDL 39.60 12/09/2019 1050   LDLCALC 47 12/09/2019 1050    Hepatic Function Latest Ref Rng & Units 03/07/2020 12/09/2019 10/23/2018  Total Protein 6.0 - 8.3 g/dL 7.1 7.0 6.8  Albumin 3.5 - 5.2 g/dL 4.2 4.1 3.9  AST 0 - 37 U/L $Remo'18 21 11  'wEXMd$ ALT 0 - 53 U/L $Remo'17 22 9  'FuuSz$ Alk Phosphatase 39 - 117 U/L 66 67 66  Total Bilirubin 0.2 - 1.2 mg/dL 1.4(H) 1.0 0.6  Bilirubin, Direct 0.0 - 0.3 mg/dL - 0.2 -     The ASCVD Risk score ([place]., et al., 2013) failed to calculate for the following reasons:   The valid total cholesterol range is 130 to 320 mg/dL   Patient has failed these meds in past: n/a Patient is currently controlled on the following medications:  . Atorvastatin 80 mg daily  We discussed:  Pt did trial off atorvastatin for ~10 days and did not notice any difference in leg cramps so he resumed; cramps occur infrequently, about once  a month, so may be musculoskeletal.  Plan  Continue current medications   BPH / OAB   PSA  Date Value Ref Range Status  12/09/2019 5.6  Final  10/23/2018 5.3  Final  07/17/2018 6.23 (H) 0.10 - 4.00 ng/mL Final    Comment:    Test performed using Access Hybritech PSA Assay, a parmagnetic partical, chemiluminecent immunoassay.    Patient has failed these meds in past: tamsulosin, Rapaflo Patient is currently uncontrolled on the following medications:  . Alfuzosin 10 mg daily   We discussed:  Pt reports alfuzosin is not working for urination issues; Provided patient with preferred formulary options: Myrbetriq, Tolterodine ER 4 mg, Toviaz, and Trospium are all Tier 3 ($37/month)  Plan  Continue current medications  Follow up with urologist as scheduled   Medication Management   Pt uses [place] for all medications Uses pill box? Yes Pt endorses 95% compliance  We discussed: Pt is satisfied with pharmacy services   Plan  Continue current medication management strategy    Follow up: 3 month phone visit  Charlene Brooke, PharmD, BCACP Clinical Pharmacist [facility] at [name] [phone]

## 2020-08-16 NOTE — Patient Instructions (Signed)
Visit Information  Phone number for Pharmacist: [phone]  Goals Addressed            This Visit's Progress   . Pharmacy Care Plan       CARE PLAN ENTRY (see longitudinal plan of care for additional care plan information)  Current Barriers:  . Chronic Disease Management support, education, and care coordination needs related to Hyperlipidemia, Atrial Fibrillation, Heart Failure, Coronary Artery Disease, Overactive Bladder, and BPH   Atrial Fibrillation / Heart Failure BP Readings from Last 3 Encounters:  06/07/20 130/60  03/07/20 118/76  01/20/20 106/64 .  Pharmacist Clinical Goal(s): o Over the next 90 days, patient will work with PharmD and providers to maintain BP goal <130/80 . Current regimen:  o Metoprolol tartrate 50 mg twice a day o Eliquis 5 mg twice a day . Interventions: o Discussed benefits of metoprolol for maintaining normal sinus rhythm, and benefits of Eliquis for stroke prevention . Patient self care activities - Over the next 90 days, patient will: o Continue medications as prescribed o Monitor for signs/symptoms of bleeding  Hyperlipidemia / Coronary artery disease Lab Results  Component Value Date/Time   LDLCALC 47 12/09/2019 10:50 AM .  Pharmacist Clinical Goal(s): o Over the next 90 days, patient will work with PharmD and providers to maintain LDL goal < 70 . Current regimen:  o Atorvastatin 80 mg daily . Interventions: o Discussed cholesterol goals and benefits of medications for prevention of heart attack / stroke o Trial off of atorvastatin yielded no change in muscle cramps; patient resumed statin . Patient self care activities - Over the next 90 days, patient will: o Continue medication as directed  Overactive bladder / BPH . Pharmacist Clinical Goal(s) o Over the next 90 days, patient will work with PharmD and providers to optimize therapy . Current regimen:  o Alfuzosin 10 mg daily . Interventions: o Provided preferred options via  [place], all are Tier 3 ($37/month) - Myrbetriq, Tolterodine ER 4 mg, Toviaz, Trospium o Discussed Myrbetriq has a unique mechanism and may offer benefit where others have failed . Patient self care activities - Over the next 90 days, patient will: o Bring preferred formulary agents to urologist  Medication management . Pharmacist Clinical Goal(s): o Over the next 90 days, patient will work with PharmD and providers to maintain optimal medication adherence . [facility] . Interventions o Comprehensive medication review performed. o Continue current medication management strategy . Patient self care activities - Over the next 90 days, patient will: o Focus on medication adherence by fill date o Take medications as prescribed o Report any questions or concerns to PharmD and/or provider(s)  Please see past updates related to this goal by clicking on the "Past Updates" button in the selected goal       Patient verbalizes understanding of instructions provided today.  Telephone follow up appointment with pharmacy team member scheduled for: 3 months  Charlene Brooke, PharmD, [facility] Clinical Pharmacist [facility] at [facility] [phone]

## 2020-08-30 ENCOUNTER — Other Ambulatory Visit: Payer: Self-pay | Admitting: Cardiology

## 2020-11-11 ENCOUNTER — Telehealth: Payer: Medicare Other

## 2020-11-24 ENCOUNTER — Telehealth: Payer: Self-pay | Admitting: Pharmacist

## 2020-11-24 NOTE — Progress Notes (Signed)
° ° °  Chronic Care Management Pharmacy Assistant   Name: Blake [name]  MRN: [id number] DOB: [birth year]  Reason for Encounter: General Adherence Call   PCP : Janith Lima, MD  Allergies:  No Known Allergies  Medications: Outpatient Encounter Medications as of 11/24/2020  Medication Sig   alfuzosin (UROXATRAL) 10 MG 24 hr tablet Take 10 mg by mouth daily with breakfast.   atorvastatin (LIPITOR) 80 MG tablet TAKE ONE TABLET BY MOUTH DAILY AT 6 IN THE EVENING   ELIQUIS 5 MG TABS tablet TAKE ONE TABLET BY MOUTH TWICE A DAY   metoprolol tartrate (LOPRESSOR) 50 MG tablet TAKE ONE TABLET BY MOUTH TWICE A DAY   polyethylene glycol powder (GLYCOLAX/MIRALAX) 17 GM/SCOOP powder Take 255 g by mouth daily.   No facility-administered encounter medications on file as of 11/24/2020.    Current Diagnosis: Patient Active Problem List   Diagnosis Date Noted   Need for Tdap vaccination 06/07/2020   Need for shingles vaccine 06/07/2020   Chronic idiopathic constipation 12/09/2019   Nonspecific abnormal electrocardiogram (ECG) (EKG) 12/08/2019   OAB (overactive bladder) 10/23/2018   PSA elevation 07/18/2018   Other dietary vitamin B12 deficiency anemia 07/17/2018   Mitral valve insufficiency    Diastolic dysfunction with chronic heart failure (HCC) 02/12/2017   AC (acromioclavicular) arthritis 01/29/2017   Spinal stenosis of lumbar region at multiple levels 12/01/2015   Routine general medical examination at a health care facility 11/08/2015   Obesity (BMI 30-39.9) 10/28/2012   BPH (benign prostatic hyperplasia) 06/08/2011   Gastric ulcer 06/08/2011   Coronary artery disease due to lipid rich plaque 05/03/2011   Atrial fibrillation, chronic ([place]) 04/05/2011   Dyslipidemia, goal LDL below 70 05/25/2009   BINGE EATING DISORDER 05/25/2009    Goals Addressed   None     Follow-Up:  Pharmacist Review    A general adherence and wellness call was made to Mr. Blake [name]  to ask how he has been doing since he last spoke with the clinical pharmacist Mendel Ryder. I asked the patient has he had any changes in his health since he last conversation with Mendel Ryder. He stated that he has had some pain in his side and it has been really bothersome to him. He stated that for a while it kind of went away but it now seems to be constant to where he is having trouble trying to sleep. The patient stated that he thinks he may have pulled a muscle or maybe strained picking something up. I also asked the patient has he been to see the Urologist since he and Mendel Ryder talked, he said that he has not. He did say that the urologist office called him to make and appointment but he has not made one yet. He stated that Mendel Ryder had email him a list of medications that he can discuss with the urologist but he has missed place that list. He states that he would like to have the list emailed ([email or web address]) to him again before he makes the appointment. I let the patient know that I would pass along the information to [place].   Wendy Poet, [place] [phone]

## 2020-11-30 DIAGNOSIS — L308 Other specified dermatitis: Secondary | ICD-10-CM | POA: Diagnosis not present

## 2020-11-30 DIAGNOSIS — L821 Other seborrheic keratosis: Secondary | ICD-10-CM | POA: Diagnosis not present

## 2020-11-30 DIAGNOSIS — L218 Other seborrheic dermatitis: Secondary | ICD-10-CM | POA: Diagnosis not present

## 2020-11-30 DIAGNOSIS — Z85828 Personal history of other malignant neoplasm of skin: Secondary | ICD-10-CM | POA: Diagnosis not present

## 2020-12-12 ENCOUNTER — Ambulatory Visit: Payer: Medicare Other | Admitting: Internal Medicine

## 2020-12-14 ENCOUNTER — Encounter: Payer: Self-pay | Admitting: Internal Medicine

## 2020-12-14 ENCOUNTER — Ambulatory Visit (INDEPENDENT_AMBULATORY_CARE_PROVIDER_SITE_OTHER): Payer: Medicare Other

## 2020-12-14 ENCOUNTER — Other Ambulatory Visit: Payer: Self-pay

## 2020-12-14 ENCOUNTER — Ambulatory Visit (INDEPENDENT_AMBULATORY_CARE_PROVIDER_SITE_OTHER): Payer: Medicare Other | Admitting: Internal Medicine

## 2020-12-14 VITALS — BP 118/78 | HR 93 | Temp 98.3°F | Resp 16 | Ht 66.0 in | Wt 200.0 lb

## 2020-12-14 VITALS — BP 118/78 | HR 93 | Temp 98.3°F | Ht 65.98 in | Wt 200.0 lb

## 2020-12-14 DIAGNOSIS — S299XXA Unspecified injury of thorax, initial encounter: Secondary | ICD-10-CM

## 2020-12-14 DIAGNOSIS — Z0000 Encounter for general adult medical examination without abnormal findings: Secondary | ICD-10-CM

## 2020-12-14 DIAGNOSIS — K648 Other hemorrhoids: Secondary | ICD-10-CM

## 2020-12-14 DIAGNOSIS — R0789 Other chest pain: Secondary | ICD-10-CM

## 2020-12-14 NOTE — Patient Instructions (Addendum)
Blake [name] , Thank you for taking time to come for your Medicare Wellness Visit. I appreciate your ongoing commitment to your health goals. Please review the following plan we discussed and let me know if I can assist you in the future.   Screening recommendations/referrals: Colonoscopy: 11/19/2018; due every 5 years Recommended yearly ophthalmology/optometry visit for glaucoma screening and checkup Recommended yearly dental visit for hygiene and checkup  Vaccinations: Influenza vaccine: 08/17/2019 (per [place]) Pneumococcal vaccine: 02/27/2017, 07/17/2018 Tdap vaccine: 06/07/2020; due every 10 years Shingles vaccine: 06/30/2020, 12/09/2020 Covid-19: 11/12/2019, 12/04/2019, 07/25/2020  Advanced directives: Advance directive discussed with you today. Even though you declined this today please call our office should you change your mind and we can give you the proper paperwork for you to fill out.  Conditions/risks identified: Yes; Reviewed health maintenance screenings with patient today and relevant education, vaccines, and/or referrals were provided. Please continue to do your personal lifestyle choices by: daily care of teeth and gums, regular physical activity (goal should be 5 days a week for 30 minutes), eat a healthy diet, avoid tobacco and drug use, limiting any alcohol intake, taking a low-dose aspirin (if not allergic or have been advised by your provider otherwise) and taking vitamins and minerals as recommended by your provider. Continue doing brain stimulating activities (puzzles, reading, adult coloring books, staying active) to keep memory sharp. Continue to eat heart healthy diet (full of fruits, vegetables, whole grains, lean protein, water--limit salt, fat, and sugar intake) and increase physical activity as tolerated.  Next appointment: Please schedule your next Medicare Wellness Visit with your Nurse Health Advisor in 1 year by calling [phone].   Preventive Care 15  Years and Older, Male Preventive care refers to lifestyle choices and visits with your health care provider that can promote health and wellness. What does preventive care include?  A yearly physical exam. This is also called an annual well check.  Dental exams once or twice a year.  Routine eye exams. Ask your health care provider how often you should have your eyes checked.  Personal lifestyle choices, including:  Daily care of your teeth and gums.  Regular physical activity.  Eating a healthy diet.  Avoiding tobacco and drug use.  Limiting alcohol use.  Practicing safe sex.  Taking low doses of aspirin every day.  Taking vitamin and mineral supplements as recommended by your health care provider. What happens during an annual well check? The services and screenings done by your health care provider during your annual well check will depend on your age, overall health, lifestyle risk factors, and family history of disease. Counseling  Your health care provider may ask you questions about your:  Alcohol use.  Tobacco use.  Drug use.  Emotional well-being.  Home and relationship well-being.  Sexual activity.  Eating habits.  History of falls.  Memory and ability to understand (cognition).  Work and work Statistician. Screening  You may have the following tests or measurements:  Height, weight, and BMI.  Blood pressure.  Lipid and cholesterol levels. These may be checked every 5 years, or more frequently if you are over 50 years old.  Skin check.  Lung cancer screening. You may have this screening every year starting at age 75 if you have a 30-pack-year history of smoking and currently smoke or have quit within the past 15 years.  Fecal occult blood test (FOBT) of the stool. You may have this test every year starting at age 2.  Flexible sigmoidoscopy  or colonoscopy. You may have a sigmoidoscopy every 5 years or a colonoscopy every 10 years starting at  age 30.  Prostate cancer screening. Recommendations will vary depending on your family history and other risks.  Hepatitis C blood test.  Hepatitis B blood test.  Sexually transmitted disease (STD) testing.  Diabetes screening. This is done by checking your blood sugar (glucose) after you have not eaten for a while (fasting). You may have this done every 1-3 years.  Abdominal aortic aneurysm (AAA) screening. You may need this if you are a current or former smoker.  Osteoporosis. You may be screened starting at age 24 if you are at high risk. Talk with your health care provider about your test results, treatment options, and if necessary, the need for more tests. Vaccines  Your health care provider may recommend certain vaccines, such as:  Influenza vaccine. This is recommended every year.  Tetanus, diphtheria, and acellular pertussis (Tdap, Td) vaccine. You may need a Td booster every 10 years.  Zoster vaccine. You may need this after age 16.  Pneumococcal 13-valent conjugate (PCV13) vaccine. One dose is recommended after age 70.  Pneumococcal polysaccharide (PPSV23) vaccine. One dose is recommended after age 91. Talk to your health care provider about which screenings and vaccines you need and how often you need them. This information is not intended to replace advice given to you by your health care provider. Make sure you discuss any questions you have with your health care provider. Document Released: 11/04/2015 Document Revised: 06/27/2016 Document Reviewed: 08/09/2015 Elsevier Interactive Patient Education  2017 [place] Prevention in the Home Falls can cause injuries. They can happen to people of all ages. There are many things you can do to make your home safe and to help prevent falls. What can I do on the outside of my home?  Regularly fix the edges of walkways and driveways and fix any cracks.  Remove anything that might make you trip as you walk through  a door, such as a raised step or threshold.  Trim any bushes or trees on the path to your home.  Use bright outdoor lighting.  Clear any walking paths of anything that might make someone trip, such as rocks or tools.  Regularly check to see if handrails are loose or broken. Make sure that both sides of any steps have handrails.  Any raised decks and porches should have guardrails on the edges.  Have any leaves, snow, or ice cleared regularly.  Use sand or salt on walking paths during winter.  Clean up any spills in your garage right away. This includes oil or grease spills. What can I do in the bathroom?  Use night lights.  Install grab bars by the toilet and in the tub and shower. Do not use towel bars as grab bars.  Use non-skid mats or decals in the tub or shower.  If you need to sit down in the shower, use a plastic, non-slip stool.  Keep the floor dry. Clean up any water that spills on the floor as soon as it happens.  Remove soap buildup in the tub or shower regularly.  Attach bath mats securely with double-sided non-slip rug tape.  Do not have throw rugs and other things on the floor that can make you trip. What can I do in the bedroom?  Use night lights.  Make sure that you have a light by your bed that is easy to reach.  Do not use  any sheets or blankets that are too big for your bed. They should not hang down onto the floor.  Have a firm chair that has side arms. You can use this for support while you get dressed.  Do not have throw rugs and other things on the floor that can make you trip. What can I do in the kitchen?  Clean up any spills right away.  Avoid walking on wet floors.  Keep items that you use a lot in easy-to-reach places.  If you need to reach something above you, use a strong step stool that has a grab bar.  Keep electrical cords out of the way.  Do not use floor polish or wax that makes floors slippery. If you must use wax, use  non-skid floor wax.  Do not have throw rugs and other things on the floor that can make you trip. What can I do with my stairs?  Do not leave any items on the stairs.  Make sure that there are handrails on both sides of the stairs and use them. Fix handrails that are broken or loose. Make sure that handrails are as long as the stairways.  Check any carpeting to make sure that it is firmly attached to the stairs. Fix any carpet that is loose or worn.  Avoid having throw rugs at the top or bottom of the stairs. If you do have throw rugs, attach them to the floor with carpet tape.  Make sure that you have a light switch at the top of the stairs and the bottom of the stairs. If you do not have them, ask someone to add them for you. What else can I do to help prevent falls?  Wear shoes that:  Do not have high heels.  Have rubber bottoms.  Are comfortable and fit you well.  Are closed at the toe. Do not wear sandals.  If you use a stepladder:  Make sure that it is fully opened. Do not climb a closed stepladder.  Make sure that both sides of the stepladder are locked into place.  Ask someone to hold it for you, if possible.  Clearly mark and make sure that you can see:  Any grab bars or handrails.  First and last steps.  Where the edge of each step is.  Use tools that help you move around (mobility aids) if they are needed. These include:  Canes.  Walkers.  Scooters.  Crutches.  Turn on the lights when you go into a dark area. Replace any light bulbs as soon as they burn out.  Set up your furniture so you have a clear path. Avoid moving your furniture around.  If any of your floors are uneven, fix them.  If there are any pets around you, be aware of where they are.  Review your medicines with your doctor. Some medicines can make you feel dizzy. This can increase your chance of falling. Ask your doctor what other things that you can do to help prevent falls. This  information is not intended to replace advice given to you by your health care provider. Make sure you discuss any questions you have with your health care provider. Document Released: 08/04/2009 Document Revised: 03/15/2016 Document Reviewed: 11/12/2014 Elsevier Interactive Patient Education  2017 [employer].

## 2020-12-14 NOTE — Progress Notes (Signed)
Subjective:   Blake [name] is a 75 y.o. male who presents for Medicare Annual/Subsequent preventive examination.  Review of Systems    No ROS. Medicare Wellness Visit. Additional risk factors are reflected in social history. Cardiac Risk Factors include: advanced age (>61men, >61 women);dyslipidemia;hypertension;male gender;obesity (BMI >30kg/m2);family history of premature cardiovascular disease     Objective:    Today's Vitals   12/14/20 1127  BP: 118/78  Pulse: 93  Temp: 98.3 F (36.8 C)  SpO2: 96%  Weight: 200 lb (90.7 kg)  Height: 5' 5.98" (1.676 m)  PainSc: 0-No pain   Body mass index is 32.3 kg/m.  Advanced Directives 12/14/2020 09/17/2017 03/01/2017 02/12/2017 02/12/2017 02/06/2017 01/23/2017  Does Patient Have a Medical Advance Directive? No No Yes No No No Yes  Type of Advance Directive - Public librarian;Living will [place];Living will - - [place];Living will  Copy of [place] in Chart? - - Yes No - copy requested - - No - copy requested  Would patient like information on creating a medical advance directive? No - Patient declined No - Patient declined - No - Patient declined - - -  Pre-existing out of facility DNR order (yellow form or pink MOST form) - - - - - - -    Current Medications (verified) Outpatient Encounter Medications as of 12/14/2020  Medication Sig  . alfuzosin (UROXATRAL) 10 MG 24 hr tablet Take 10 mg by mouth daily with breakfast.  . atorvastatin (LIPITOR) 80 MG tablet TAKE ONE TABLET BY MOUTH DAILY AT 6 IN THE EVENING  . ELIQUIS 5 MG TABS tablet TAKE ONE TABLET BY MOUTH TWICE A DAY  . metoprolol tartrate (LOPRESSOR) 50 MG tablet TAKE ONE TABLET BY MOUTH TWICE A DAY  . polyethylene glycol powder (GLYCOLAX/MIRALAX) 17 GM/SCOOP powder Take 255 g by mouth daily.   No facility-administered encounter medications on file as of 12/14/2020.    Allergies (verified) Patient  has no known allergies.   History: Past Medical History:  Diagnosis Date  . Angina    none since CABG  . Arthritis   . Bladder spasm    takes Levsin as needed  . CAD (coronary artery disease)   . Diverticulosis   . Duodenal ulcer    hx of  . Dysrhythmia    atrial fib post Cabg/ takes Eliquis daily as well as Metoprolol   . GERD (gastroesophageal reflux disease)    takes Protonix daily  . History of blood transfusion    not abnormal reaction  . History of colon polyps    benign  . History of kidney stones    hx of  . Hyperlipidemia    takes Atorvastatin daily  . HYPERLIPIDEMIA 05/25/2009  . Joint pain   . Nocturia   . PAF (paroxysmal atrial fibrillation) ([place])    Past Surgical History:  Procedure Laterality Date  . CARDIAC CATHETERIZATION     6/12  . COLONOSCOPY    . CORONARY ARTERY BYPASS GRAFT  03/27/2011   x3, Dr Tharon Aquas Trigt    . ESOPHAGOGASTRODUODENOSCOPY ENDOSCOPY  04/12/11   cauterization of bleeding ulcer  and artery in duodenum and stomach   . INGUINAL HERNIA REPAIR  11/06/2011   Procedure: HERNIA REPAIR INGUINAL ADULT;  Surgeon: Earnstine Regal, MD;  Location: [facility];  Service: General;  Laterality: N/A;  Repair left inguinal hernia with mesh  . WISDOM TOOTH EXTRACTION     Family History  Problem Relation Age of Onset  . Coronary artery disease Father   . Heart attack Father 24  . Cancer Father   . Lymphoma Other   . Diabetes Sister   . Heart attack Sister 80  . Ovarian cancer Sister   . Atrial fibrillation Brother   . Colon cancer Neg Hx   . Stomach cancer Neg Hx    Social History   Socioeconomic History  . Marital status: Married    Spouse name: Not on file  . Number of children: 4  . Years of education: Not on file  . Highest education level: Not on file  Occupational History  . Occupation: retired   Tobacco Use  . Smoking status: Former Smoker    Packs/day: 1.00    Years: 25.00    Pack years: 25.00    Quit date: 03/04/1988    Years  since quitting: 32.8  . Smokeless tobacco: Never Used  Vaping Use  . Vaping Use: Never used  Substance and Sexual Activity  . Alcohol use: Yes    Alcohol/week: 1.0 standard drink    Types: 1 Cans of beer per week    Comment: occ  . Drug use: No  . Sexual activity: Not on file  Other Topics Concern  . Not on file  Social History Narrative   Regular exercise- yes    Social Determinants of Health   Financial Resource Strain: Low Risk   . Difficulty of Paying Living Expenses: Not hard at all  Food Insecurity: No Food Insecurity  . Worried About Charity fundraiser in the Last Year: Never true  . Ran Out of Food in the Last Year: Never true  Transportation Needs: No Transportation Needs  . Lack of Transportation (Medical): No  . Lack of Transportation (Non-Medical): No  Physical Activity: Sufficiently Active  . Days of Exercise per Week: 5 days  . Minutes of Exercise per Session: 30 min  Stress: No Stress Concern Present  . Feeling of Stress : Not at all  Social Connections: Socially Integrated  . Frequency of Communication with Friends and Family: More than three times a week  . Frequency of Social Gatherings with Friends and Family: Once a week  . Attends Religious Services: More than 4 times per year  . Active Member of Clubs or Organizations: Yes  . Attends Archivist Meetings: More than 4 times per year  . Marital Status: Married    Tobacco Counseling Counseling given: Not Answered   Clinical Intake:  Pre-visit preparation completed: Yes  Pain : No/denies pain Pain Score: 0-No pain     BMI - recorded: 32.3 Nutritional Status: BMI > 30  Obese Nutritional Risks: None Diabetes: No  How often do you need to have someone help you when you read instructions, pamphlets, or other written materials from your doctor or pharmacy?: 1 - Never What is the last grade level you completed in school?: Bachelor's Degree  Diabetic? no  Interpreter Needed?:  No  Information entered by :: Lisette Abu, LPN   Activities of Daily Living In your present state of health, do you have any difficulty performing the following activities: 12/14/2020  Hearing? N  Vision? N  Difficulty concentrating or making decisions? N  Walking or climbing stairs? N  Dressing or bathing? N  Doing errands, shopping? N  Preparing Food and eating ? N  Using the Toilet? N  In the past six months, have you accidently leaked urine? N  Do you  have problems with loss of bowel control? N  Managing your Medications? N  Managing your Finances? N  Housekeeping or managing your Housekeeping? N  Some recent data might be hidden    Patient Care Team: Janith Lima, MD as PCP - General (Internal Medicine) Stanford Breed Denice Bors, MD as PCP - Cardiology (Cardiology) Hillary Bow, MD (Cardiology) Kennith Center, RD as Dietitian (Family Medicine) Charlton Haws, [facility] as Pharmacist (Pharmacist)  Indicate any recent Medical Services you may have received from other than [facility] providers in the past year (date may be approximate).     Assessment:   This is a routine wellness examination for Blake [name].  Hearing/Vision screen No exam data present  Dietary issues and exercise activities discussed: Current Exercise Habits: Home exercise routine, Type of exercise: walking, Time (Minutes): 30, Frequency (Times/Week): 5, Weekly Exercise (Minutes/Week): 150, Intensity: Moderate, Exercise limited by: cardiac condition(s)  Goals    . Pharmacy Care Plan     CARE PLAN ENTRY (see longitudinal plan of care for additional care plan information)  Current Barriers:  . Chronic Disease Management support, education, and care coordination needs related to Hyperlipidemia, Atrial Fibrillation, Heart Failure, Coronary Artery Disease, Overactive Bladder, and BPH   Atrial Fibrillation / Heart Failure BP Readings from Last 3 Encounters:  06/07/20 130/60  03/07/20 118/76  01/20/20  106/64 .  Pharmacist Clinical Goal(s): o Over the next 90 days, patient will work with PharmD and providers to maintain BP goal <130/80 . Current regimen:  o Metoprolol tartrate 50 mg twice a day o Eliquis 5 mg twice a day . Interventions: o Discussed benefits of metoprolol for maintaining normal sinus rhythm, and benefits of Eliquis for stroke prevention . Patient self care activities - Over the next 90 days, patient will: o Continue medications as prescribed o Monitor for signs/symptoms of bleeding  Hyperlipidemia / Coronary artery disease Lab Results  Component Value Date/Time   LDLCALC 47 12/09/2019 10:50 AM .  Pharmacist Clinical Goal(s): o Over the next 90 days, patient will work with PharmD and providers to maintain LDL goal < 70 . Current regimen:  o Atorvastatin 80 mg daily . Interventions: o Discussed cholesterol goals and benefits of medications for prevention of heart attack / stroke o Trial off of atorvastatin yielded no change in muscle cramps; patient resumed statin . Patient self care activities - Over the next 90 days, patient will: o Continue medication as directed  Overactive bladder / BPH . Pharmacist Clinical Goal(s) o Over the next 90 days, patient will work with PharmD and providers to optimize therapy . Current regimen:  o Alfuzosin 10 mg daily . Interventions: o Provided preferred options via [place], all are Tier 3 ($37/month) - Myrbetriq, Tolterodine ER 4 mg, Toviaz, Trospium o Discussed Myrbetriq has a unique mechanism and may offer benefit where others have failed . Patient self care activities - Over the next 90 days, patient will: o Bring preferred formulary agents to urologist  Medication management . Pharmacist Clinical Goal(s): o Over the next 90 days, patient will work with PharmD and providers to maintain optimal medication adherence . [facility] . Interventions o Comprehensive medication review  performed. o Continue current medication management strategy . Patient self care activities - Over the next 90 days, patient will: o Focus on medication adherence by fill date o Take medications as prescribed o Report any questions or concerns to PharmD and/or provider(s)  Please see past updates related to this goal by  clicking on the "Past Updates" button in the selected goal       Depression Screen PHQ 2/9 Scores 12/14/2020 12/09/2019 07/18/2018 07/17/2018 03/01/2017 02/27/2017 02/27/2017  PHQ - 2 Score 0 0 0 0 0 0 0  PHQ- 9 Score - - - - - 4 -    Fall Risk Fall Risk  12/14/2020 12/09/2019 07/18/2018 07/17/2018 07/17/2018  Falls in the past year? 0 0 No No No  Number falls in past yr: 0 0 - - -  Injury with Fall? 1 0 - - -  Follow up - Falls evaluation completed - - -    FALL RISK PREVENTION PERTAINING TO THE HOME:  Any stairs in or around the home? Yes  If so, are there any without handrails? No  Home free of loose throw rugs in walkways, pet beds, electrical cords, etc? Yes  Adequate lighting in your home to reduce risk of falls? Yes   ASSISTIVE DEVICES UTILIZED TO PREVENT FALLS:  Life alert? No  Use of a cane, walker or w/c? No  Grab bars in the bathroom? No  Shower chair or bench in shower? No  Elevated toilet seat or a handicapped toilet? No   TIMED UP AND GO:  Was the test performed? No .  Length of time to ambulate 10 feet: 0 sec.   Gait steady and fast without use of assistive device  Cognitive Function: Normal cognitive status assessed by direct observation by this Nurse Health Advisor. No abnormalities found.          Immunizations Immunization History  Administered Date(s) Administered  . Influenza Whole 07/29/2008  . Influenza, High Dose Seasonal PF 08/07/2014, 09/28/2017, 07/17/2018, 08/17/2019  . Influenza,inj,Quad PF,6+ Mos 06/28/2016  . Influenza-Unspecified 07/22/2013, 08/18/2015, 08/22/2016  . PFIZER(Purple Top)SARS-COV-2 Vaccination 11/12/2019,  12/04/2019  . Pneumococcal Conjugate-13 02/27/2017  . Pneumococcal Polysaccharide-23 07/17/2018  . Tdap 06/07/2020  . Tetanus 12/11/2008  . Zoster 07/22/2013    TDAP status: Up to date  Flu Vaccine status: Due, Education has been provided regarding the importance of this vaccine. Advised may receive this vaccine at local pharmacy or Health Dept. Aware to provide a copy of the vaccination record if obtained from local pharmacy or Health Dept. Verbalized acceptance and understanding.  Pneumococcal vaccine status: Up to date  Covid-19 vaccine status: Completed vaccines  Qualifies for Shingles Vaccine? Yes   Zostavax completed Yes   Shingrix Completed?: Yes  Screening Tests Health Maintenance  Topic Date Due  . INFLUENZA VACCINE  05/22/2020  . COVID-19 Vaccine (3 - Booster for Pfizer series) 06/02/2020  . COLONOSCOPY (Pts 45-8yrs Insurance coverage will need to be confirmed)  11/20/2023  . TETANUS/TDAP  06/07/2030  . Hepatitis C Screening  Completed  . PNA vac Low Risk Adult  Completed    Health Maintenance  Health Maintenance Due  Topic Date Due  . INFLUENZA VACCINE  05/22/2020  . COVID-19 Vaccine (3 - Booster for Pfizer series) 06/02/2020    Colorectal cancer screening: Type of screening: Colonoscopy. Completed 11/19/2018. Repeat every 5 years  Lung Cancer Screening: (Low Dose CT Chest recommended if Age 15-80 years, 30 pack-year currently smoking OR have quit w/in 15years.) does not qualify.   Lung Cancer Screening Referral: no  Additional Screening:  Hepatitis C Screening: does qualify; Completed yes  Vision Screening: Recommended annual ophthalmology exams for early detection of glaucoma and other disorders of the eye. Is the patient up to date with their annual eye exam?  Yes  Who is the  provider or what is the name of the office in which the patient attends annual eye exams? [facility]  If pt is not established with a provider, would they like to  be referred to a provider to establish care? No .   Dental Screening: Recommended annual dental exams for proper oral hygiene  Community Resource Referral / Chronic Care Management: CRR required this visit?  No   CCM required this visit?  No      Plan:     I have personally reviewed and noted the following in the patient's chart:   . Medical and social history . Use of alcohol, tobacco or illicit drugs  . Current medications and supplements . Functional ability and status . Nutritional status . Physical activity . Advanced directives . List of other physicians . Hospitalizations, surgeries, and ER visits in previous 12 months . Vitals . Screenings to include cognitive, depression, and falls . Referrals and appointments  In addition, I have reviewed and discussed with patient certain preventive protocols, quality metrics, and best practice recommendations. A written personalized care plan for preventive services as well as general preventive health recommendations were provided to patient.     Sheral Flow, LPN   3/46/2194   Nurse Notes:  Medications reviewed with patient; no opioid use noted.

## 2020-12-14 NOTE — Patient Instructions (Signed)

## 2020-12-14 NOTE — Progress Notes (Signed)
Subjective:  Patient ID: Blake [name], male    DOB: Feb 27, 1946  Age: 75 y.o. MRN: [id number]  CC: Follow-up  This visit occurred during the SARS-CoV-2 public health emergency.  Safety protocols were in place, including screening questions prior to the visit, additional usage of staff PPE, and extensive cleaning of exam room while observing appropriate contact time as indicated for disinfecting solutions.    HPI Blake [name] presents for f/up -  He complains that he fell 4 weeks ago and injured the left side of his rib cage.  He has improving pain and is getting symptom relief with acetaminophen.  He complains of chronic fatigue and intermittent episodes of bright red blood per rectum.  He has a history of internal anal hemorrhoids.  Outpatient Medications Prior to Visit  Medication Sig Dispense Refill  . alfuzosin (UROXATRAL) 10 MG 24 hr tablet Take 10 mg by mouth daily with breakfast.    . atorvastatin (LIPITOR) 80 MG tablet TAKE ONE TABLET BY MOUTH DAILY AT 6 IN THE EVENING 30 tablet 6  . ELIQUIS 5 MG TABS tablet TAKE ONE TABLET BY MOUTH TWICE A DAY 180 tablet 1  . metoprolol tartrate (LOPRESSOR) 50 MG tablet TAKE ONE TABLET BY MOUTH TWICE A DAY 180 tablet 2  . polyethylene glycol powder (GLYCOLAX/MIRALAX) 17 GM/SCOOP powder Take 255 g by mouth daily. 255 g 3   No facility-administered medications prior to visit.    ROS Review of Systems  Constitutional: Positive for fatigue. Negative for appetite change, chills, diaphoresis, fever and unexpected weight change.  HENT: Negative.  Negative for trouble swallowing.   Respiratory: Negative for cough, chest tightness, shortness of breath and wheezing.   Cardiovascular: Positive for chest pain. Negative for palpitations and leg swelling.  Gastrointestinal: Positive for anal bleeding and blood in stool. Negative for abdominal pain, constipation, diarrhea, nausea and vomiting.  Endocrine: Negative.   Genitourinary: Negative.   Negative for difficulty urinating, flank pain and hematuria.  Musculoskeletal: Negative.   Skin: Negative.  Negative for color change.  Neurological: Negative.  Negative for dizziness, weakness and light-headedness.  Hematological: Negative for adenopathy. Does not bruise/bleed easily.  Psychiatric/Behavioral: Negative.     Objective:  BP 118/78   Pulse 93   Temp 98.3 F (36.8 C) (Oral)   Resp 16   Ht 5\' 6"  (1.676 m)   Wt 200 lb (90.7 kg)   SpO2 96%   BMI 32.28 kg/m   BP Readings from Last 3 Encounters:  12/14/20 118/78  12/14/20 118/78  06/07/20 130/60    Wt Readings from Last 3 Encounters:  12/14/20 200 lb (90.7 kg)  12/14/20 200 lb (90.7 kg)  06/07/20 196 lb 6 oz (89.1 kg)    Physical Exam Vitals reviewed.  Constitutional:      General: He is not in acute distress.    Appearance: He is not ill-appearing, toxic-appearing or diaphoretic.  HENT:     Nose: Nose normal.     Mouth/Throat:     Mouth: Mucous membranes are moist.  Eyes:     General: No scleral icterus.    Conjunctiva/sclera: Conjunctivae normal.  Cardiovascular:     Rate and Rhythm: Normal rate and regular rhythm.     Heart sounds: No murmur heard. No gallop.   Pulmonary:     Effort: Pulmonary effort is normal.     Breath sounds: No stridor. No wheezing, rhonchi or rales.  Chest:     Chest wall: No deformity, swelling or tenderness.  Abdominal:     General: Abdomen is protuberant. Bowel sounds are normal. There is no distension.     Palpations: Abdomen is soft. There is no hepatomegaly, splenomegaly or mass.     Tenderness: There is no abdominal tenderness.  Genitourinary:    Prostate: Normal.     Rectum: Guaiac result negative. Internal hemorrhoid present. No mass, tenderness, anal fissure or external hemorrhoid. Normal anal tone.     Comments: He has mild, uncomplicated internal anal hemorrhoids. Musculoskeletal:        General: Normal range of motion.     Cervical back: Neck supple.      Right lower leg: No edema.     Left lower leg: No edema.  Lymphadenopathy:     Cervical: No cervical adenopathy.  Skin:    General: Skin is warm and dry.  Neurological:     General: No focal deficit present.     Mental Status: He is alert. Mental status is at baseline.     Lab Results  Component Value Date   WBC 11.1 (H) 03/07/2020   HGB 14.7 03/07/2020   HCT 43.8 03/07/2020   PLT 255.0 03/07/2020   GLUCOSE 93 03/07/2020   CHOL 98 12/09/2019   TRIG 61.0 12/09/2019   HDL 39.60 12/09/2019   LDLCALC 47 12/09/2019   ALT 17 03/07/2020   AST 18 03/07/2020   NA 139 03/07/2020   K 4.4 03/07/2020   CL 106 03/07/2020   CREATININE 0.92 03/07/2020   BUN 17 03/07/2020   CO2 28 03/07/2020   TSH 0.87 12/09/2019   PSA 5.6 12/09/2019   INR 1.05 01/29/2017   HGBA1C 5.8 11/07/2015    US Abdomen Complete  Result Date: 01/26/2020 CLINICAL DATA:  Abdominal bloating, constipation EXAM: ABDOMEN ULTRASOUND COMPLETE COMPARISON:  None. FINDINGS: Gallbladder: No gallstones or wall thickening visualized. No sonographic Murphy sign noted by sonographer. Common bile duct: Diameter: 6 mm Liver: No focal lesion identified. Within normal limits in parenchymal echogenicity. Portal vein is patent on color Doppler imaging with normal direction of blood flow towards the liver. IVC: Obscured by bowel gas. Pancreas: Obscured by bowel gas. Spleen: Poorly visualized, partially obscured by bowel gas. Right Kidney: Length: 10.7 cm. Echogenicity within normal limits. No mass or hydronephrosis visualized. Left Kidney: Length: 11.0 cm. Echogenicity within normal limits. No mass or hydronephrosis visualized. Abdominal aorta: Obscured by bowel gas. Other findings: None. IMPRESSION: Examination is very limited by bowel gas throughout. Within this limitation, no ultrasound findings to explain constipation or bloating. Electronically Signed   By: Eddie Candle M.D.   On: 01/26/2020 16:35   CLINICAL DATA:  Left-sided chest pain  without known injury.  EXAM: LEFT RIBS - 2 VIEW  COMPARISON:  February 12, 2017.  FINDINGS: No fracture or other bone lesions are seen involving the ribs.  IMPRESSION: Negative.   Electronically Signed   By: Marijo Conception M.D.   On: 12/14/2020 11:34    Assessment & Plan:   Undray was seen today for follow-up.  Diagnoses and all orders for this visit:  Left-sided chest wall pain- His discomfort is improving.  The plain x-rays are reassuring.  Will continue acetaminophen as needed. -     Cancel: DG Ribs Unilateral Left; Future -     DG Ribs Unilateral Left; Future  Soft tissue injury of left chest wall -     Cancel: DG Ribs Unilateral Left; Future -     DG Ribs Unilateral Left; Future  Internal hemorrhoids  without complication- I think this explains the bright red blood per rectum.  He tells me his symptoms are not significant enough for him to warrant treating this.   I am having [name] "Ken" maintain his alfuzosin, polyethylene glycol powder, metoprolol tartrate, Eliquis, and atorvastatin.  No orders of the defined types were placed in this encounter.    Follow-up: Return if symptoms worsen or fail to improve.  Scarlette Calico, MD

## 2020-12-18 ENCOUNTER — Other Ambulatory Visit: Payer: Self-pay | Admitting: Internal Medicine

## 2020-12-18 ENCOUNTER — Encounter: Payer: Self-pay | Admitting: Internal Medicine

## 2020-12-18 DIAGNOSIS — K648 Other hemorrhoids: Secondary | ICD-10-CM | POA: Insufficient documentation

## 2020-12-19 ENCOUNTER — Telehealth: Payer: Self-pay | Admitting: Pharmacist

## 2020-12-19 NOTE — Progress Notes (Signed)
    Chronic Care Management Pharmacy Assistant   Name: CHANCELER PULLIN  MRN: [id number] DOB: 1946-06-02  Reason for Encounter:    PCP : Janith Lima, MD  Allergies:  No Known Allergies  Medications: Outpatient Encounter Medications as of 12/19/2020  Medication Sig  . alfuzosin (UROXATRAL) 10 MG 24 hr tablet Take 10 mg by mouth daily with breakfast.  . atorvastatin (LIPITOR) 80 MG tablet TAKE ONE TABLET BY MOUTH DAILY AT 6 IN THE EVENING  . ELIQUIS 5 MG TABS tablet TAKE ONE TABLET BY MOUTH TWICE A DAY  . metoprolol tartrate (LOPRESSOR) 50 MG tablet TAKE ONE TABLET BY MOUTH TWICE A DAY  . polyethylene glycol powder (GLYCOLAX/MIRALAX) 17 GM/SCOOP powder Take 255 g by mouth daily.   No facility-administered encounter medications on file as of 12/19/2020.    Current Diagnosis: Patient Active Problem List   Diagnosis Date Noted  . Internal hemorrhoids without complication 27/51/7001  . Left-sided chest wall pain 12/14/2020  . Soft tissue injury of left chest wall 12/14/2020  . Need for Tdap vaccination 06/07/2020  . Need for shingles vaccine 06/07/2020  . Chronic idiopathic constipation 12/09/2019  . Nonspecific abnormal electrocardiogram (ECG) (EKG) 12/08/2019  . OAB (overactive bladder) 10/23/2018  . PSA elevation 07/18/2018  . Other dietary vitamin B12 deficiency anemia 07/17/2018  . Mitral valve insufficiency   . Diastolic dysfunction with chronic heart failure ([place]) 02/12/2017  . AC (acromioclavicular) arthritis 01/29/2017  . Spinal stenosis of lumbar region at multiple levels 12/01/2015  . Routine general medical examination at a health care facility 11/08/2015  . Obesity (BMI 30-39.9) 10/28/2012  . BPH (benign prostatic hyperplasia) 06/08/2011  . Gastric ulcer 06/08/2011  . Coronary artery disease due to lipid rich plaque 05/03/2011  . Atrial fibrillation, chronic ([place]) 04/05/2011  . Dyslipidemia, goal LDL below 70 05/25/2009  . BINGE EATING DISORDER 05/25/2009     Goals Addressed   None     Follow-Up:  Pharmacist Review  Reviewed chart for medication changes and adherence.  The patient last saw Dr. Ronnald Ramp on 12/14/20 since last care coordination call with clinical pharmacist, no medication changes were made.  No gaps in adherence identified. Patient has follow up scheduled with pharmacy team. No further action required.   Wendy Poet, [place] [phone]

## 2021-01-19 ENCOUNTER — Other Ambulatory Visit: Payer: Self-pay | Admitting: Cardiology

## 2021-01-19 NOTE — Telephone Encounter (Signed)
30m, 90.7kg, scr 0.92 (03/07/20), lovw/crenshaw 12/08/19

## 2021-03-05 ENCOUNTER — Other Ambulatory Visit: Payer: Self-pay | Admitting: Cardiology

## 2021-03-09 ENCOUNTER — Encounter: Payer: Self-pay | Admitting: Internal Medicine

## 2021-03-09 ENCOUNTER — Ambulatory Visit (INDEPENDENT_AMBULATORY_CARE_PROVIDER_SITE_OTHER): Payer: Medicare Other | Admitting: Internal Medicine

## 2021-03-09 ENCOUNTER — Other Ambulatory Visit: Payer: Self-pay

## 2021-03-09 DIAGNOSIS — I482 Chronic atrial fibrillation, unspecified: Secondary | ICD-10-CM

## 2021-03-09 DIAGNOSIS — S61212A Laceration without foreign body of right middle finger without damage to nail, initial encounter: Secondary | ICD-10-CM

## 2021-03-09 NOTE — Progress Notes (Signed)
Patient ID: Blake [name], male   DOB: Jun 13, 1946, 75 y.o.   MRN: [id number]        Chief Complaint: laceration healing right?       HPI:  Blake [name] is a 75 y.o. male here with c/o 1 wk onset laceration to most distal tip end of the right hand 3rd finger buzzsaw accident, to him seemed to enter about 1/4 inch has some immediate pain and bleeding, and since then has been treating it with keeping it clean after the bleeding stopped, gauze covering and triple antibx otc.  Overall has done well it seems without worsening red, tender, swelling but the laceration itself is only scabbed and he is wondering if he has done the right thing, since with other lacerations it always has seemed to cover over and heal within a few days only.   Pt denies fever, wt loss, night sweats, loss of appetite, or other constitutional symptoms  Pt denies chest pain, increased sob or doe, wheezing, orthopnea, PND, increased LE swelling, palpitations, dizziness or syncope.  No other new complaints        Wt Readings from Last 3 Encounters:  03/09/21 169 lb (76.7 kg)  12/14/20 200 lb (90.7 kg)  12/14/20 200 lb (90.7 kg)   BP Readings from Last 3 Encounters:  03/09/21 102/70  12/14/20 118/78  12/14/20 118/78         Past Medical History:  Diagnosis Date  . Angina    none since CABG  . Arthritis   . Bladder spasm    takes Levsin as needed  . CAD (coronary artery disease)   . Diverticulosis   . Duodenal ulcer    hx of  . Dysrhythmia    atrial fib post Cabg/ takes Eliquis daily as well as Metoprolol   . GERD (gastroesophageal reflux disease)    takes Protonix daily  . History of blood transfusion    not abnormal reaction  . History of colon polyps    benign  . History of kidney stones    hx of  . Hyperlipidemia    takes Atorvastatin daily  . HYPERLIPIDEMIA 05/25/2009  . Joint pain   . Nocturia   . PAF (paroxysmal atrial fibrillation) ([place])    Past Surgical History:  Procedure Laterality Date  .  CARDIAC CATHETERIZATION     6/12  . COLONOSCOPY    . CORONARY ARTERY BYPASS GRAFT  03/27/2011   x3, Dr Tharon Aquas Trigt    . ESOPHAGOGASTRODUODENOSCOPY ENDOSCOPY  04/12/11   cauterization of bleeding ulcer  and artery in duodenum and stomach   . INGUINAL HERNIA REPAIR  11/06/2011   Procedure: HERNIA REPAIR INGUINAL ADULT;  Surgeon: Earnstine Regal, MD;  Location: [facility];  Service: General;  Laterality: N/A;  Repair left inguinal hernia with mesh  . WISDOM TOOTH EXTRACTION      reports that he quit smoking about 33 years ago. He has a 25.00 pack-year smoking history. He has never used smokeless tobacco. He reports current alcohol use of about 1.0 standard drink of alcohol per week. He reports that he does not use drugs. family history includes Atrial fibrillation in his brother; Cancer in his father; Coronary artery disease in his father; Diabetes in his sister; Heart attack (age of onset: 16) in his sister; Heart attack (age of onset: 87) in his father; Lymphoma in an other family member; Ovarian cancer in his sister. No Known Allergies Current Outpatient Medications on File Prior to Visit  Medication Sig Dispense Refill  . alfuzosin (UROXATRAL) 10 MG 24 hr tablet Take 10 mg by mouth daily with breakfast.    . atorvastatin (LIPITOR) 80 MG tablet TAKE ONE TABLET BY MOUTH DAILY AT 6 IN THE EVENING 30 tablet 6  . ELIQUIS 5 MG TABS tablet TAKE ONE TABLET BY MOUTH TWICE A DAY 180 tablet 1  . metoprolol tartrate (LOPRESSOR) 50 MG tablet TAKE ONE TABLET BY MOUTH TWICE A DAY 60 tablet 1  . polyethylene glycol powder (GLYCOLAX/MIRALAX) 17 GM/SCOOP powder Take 255 g by mouth daily. 255 g 3   No current facility-administered medications on file prior to visit.        ROS:  All others reviewed and negative.  Objective        PE:  BP 102/70 (BP Location: Right Arm, Patient Position: Sitting, Cuff Size: Normal)   Pulse 86   Temp 98.2 F (36.8 C) (Oral)   Ht 5' 5.98" (1.676 m)   Wt 169 lb (76.7 kg)    SpO2 97%   BMI 27.29 kg/m                 Constitutional: Pt appears in NAD               HENT: Head: NCAT.                Right Ear: External ear normal.                 Left Ear: External ear normal.                Eyes: . Pupils are equal, round, and reactive to light. Conjunctivae and EOM are normal               Nose: without d/c or deformity               Neck: Neck supple. Gross normal ROM               Cardiovascular: Normal rate and regular rhythm.                 Pulmonary/Chest: Effort normal and breath sounds without rales or wheezing.                Abd:  Soft, NT, ND, + BS, no organomegaly               Neurological: Pt is alert. At baseline orientation, motor grossly intact               Skin: LE edema - none; 3rd finger right hand tip with scabbed lesion approx 10 mm x 2 mm by 1 mm deep, without red, tender,, swelling               Psychiatric: Pt behavior is normal without agitation   Micro: none  Cardiac tracings I have personally interpreted today:  none  Pertinent Radiological findings (summarize): none   Lab Results  Component Value Date   WBC 11.1 (H) 03/07/2020   HGB 14.7 03/07/2020   HCT 43.8 03/07/2020   PLT 255.0 03/07/2020   GLUCOSE 93 03/07/2020   CHOL 98 12/09/2019   TRIG 61.0 12/09/2019   HDL 39.60 12/09/2019   LDLCALC 47 12/09/2019   ALT 17 03/07/2020   AST 18 03/07/2020   NA 139 03/07/2020   K 4.4 03/07/2020   CL 106 03/07/2020   CREATININE 0.92 03/07/2020   BUN 17 03/07/2020   CO2 28  03/07/2020   TSH 0.87 12/09/2019   PSA 5.6 12/09/2019   INR 1.05 01/29/2017   HGBA1C 5.8 11/07/2015   Assessment/Plan:  Blake [name] is a 75 y.o. White or Caucasian [1] male with  has a past medical history of Angina, Arthritis, Bladder spasm, CAD (coronary artery disease), Diverticulosis, Duodenal ulcer, Dysrhythmia, GERD (gastroesophageal reflux disease), History of blood transfusion, History of colon polyps, History of kidney stones, Hyperlipidemia,  HYPERLIPIDEMIA (05/25/2009), Joint pain, Nocturia, and PAF (paroxysmal atrial fibrillation) ([place]).  Finger laceration 1 wk post buzzsaw accident with normal healing by exam, pt reassured, no need to change in tx,  to f/u any worsening symptoms or concerns   Atrial fibrillation, chronic (HCC) Stable rate and volume,  to f/u any worsening symptoms or concerns  Followup: Return if symptoms worsen or fail to improve.  Cathlean Cower, MD 03/12/2021 12:23 PM [facility]

## 2021-03-12 ENCOUNTER — Encounter: Payer: Self-pay | Admitting: Internal Medicine

## 2021-03-12 DIAGNOSIS — S61219A Laceration without foreign body of unspecified finger without damage to nail, initial encounter: Secondary | ICD-10-CM | POA: Insufficient documentation

## 2021-03-12 NOTE — Patient Instructions (Signed)
Continue the same treatment

## 2021-03-12 NOTE — Assessment & Plan Note (Signed)
1 wk post buzzsaw accident with normal healing by exam, pt reassured, no need to change in tx,  to f/u any worsening symptoms or concerns

## 2021-03-12 NOTE — Assessment & Plan Note (Signed)
Stable rate and volume,  to f/u any worsening symptoms or concerns  

## 2021-03-29 ENCOUNTER — Telehealth: Payer: Self-pay

## 2021-03-29 DIAGNOSIS — E785 Hyperlipidemia, unspecified: Secondary | ICD-10-CM

## 2021-03-29 NOTE — Progress Notes (Addendum)
    Chronic Care Management Pharmacy Assistant   Name: Blake [name]  MRN: [id number] DOB: 1946-09-16  Reason for Encounter: Disease State - General Adherence  Recent office visits:  03/09/21 Jenny Reichmann (PCP) - laceration right middle finger. No med changes.  12/14/20 Ronnald Ramp (PCP) - Left-sided chest wall. No med changes.  Recent consult visits:  None noted  Hospital visits:  None in previous 6 months  Medications: Outpatient Encounter Medications as of 03/29/2021  Medication Sig   alfuzosin (UROXATRAL) 10 MG 24 hr tablet Take 10 mg by mouth daily with breakfast.   atorvastatin (LIPITOR) 80 MG tablet TAKE ONE TABLET BY MOUTH DAILY AT 6 IN THE EVENING   ELIQUIS 5 MG TABS tablet TAKE ONE TABLET BY MOUTH TWICE A DAY   metoprolol tartrate (LOPRESSOR) 50 MG tablet TAKE ONE TABLET BY MOUTH TWICE A DAY   polyethylene glycol powder (GLYCOLAX/MIRALAX) 17 GM/SCOOP powder Take 255 g by mouth daily.   No facility-administered encounter medications on file as of 03/29/2021.    Have you had any problems recently with your health? Patient states no problems at this time.  Have you had any problems with your pharmacy? Patient states no problems with pharmacy.   What issues or side effects are you having with your medications? Patient states no side effects or problems at this time.   What would you like me to pass along to [facility] for them to help you with?  Patient states he needs a refill on his Atorvastatin prescription.  What can we do to take care of you better? Patient states nothing at this time and thanks for checking in on him.   Star Rating Drugs: Atorvastatin - last fill 08/31/20 90D Patient states he has had it filled since Nov., he states he ran out about 1 week ago and needs a refill on his prescription.  Orinda Kenner, [place] Clinical Pharmacists Assistant [phone]

## 2021-03-31 MED ORDER — ATORVASTATIN CALCIUM 80 MG PO TABS: 80.0000 mg | ORAL_TABLET | Freq: Every day | ORAL | 1 refills | Status: DC

## 2021-03-31 NOTE — Addendum Note (Signed)
Addended by: Charlton Haws on: 03/31/2021 09:37 AM   Modules accepted: Orders

## 2021-03-31 NOTE — Telephone Encounter (Signed)
Rx sent 

## 2021-04-26 ENCOUNTER — Ambulatory Visit (INDEPENDENT_AMBULATORY_CARE_PROVIDER_SITE_OTHER): Payer: Medicare Other | Admitting: Internal Medicine

## 2021-04-26 ENCOUNTER — Encounter: Payer: Self-pay | Admitting: Internal Medicine

## 2021-04-26 ENCOUNTER — Other Ambulatory Visit: Payer: Self-pay

## 2021-04-26 VITALS — BP 116/66 | HR 75 | Temp 98.2°F | Ht 65.98 in | Wt 173.0 lb

## 2021-04-26 DIAGNOSIS — E785 Hyperlipidemia, unspecified: Secondary | ICD-10-CM | POA: Diagnosis not present

## 2021-04-26 DIAGNOSIS — Z0000 Encounter for general adult medical examination without abnormal findings: Secondary | ICD-10-CM | POA: Diagnosis not present

## 2021-04-26 DIAGNOSIS — G4762 Sleep related leg cramps: Secondary | ICD-10-CM | POA: Insufficient documentation

## 2021-04-26 DIAGNOSIS — K5904 Chronic idiopathic constipation: Secondary | ICD-10-CM

## 2021-04-26 DIAGNOSIS — R972 Elevated prostate specific antigen [PSA]: Secondary | ICD-10-CM | POA: Diagnosis not present

## 2021-04-26 DIAGNOSIS — I482 Chronic atrial fibrillation, unspecified: Secondary | ICD-10-CM | POA: Diagnosis not present

## 2021-04-26 DIAGNOSIS — R0683 Snoring: Secondary | ICD-10-CM | POA: Diagnosis not present

## 2021-04-26 DIAGNOSIS — J300 Vasomotor rhinitis: Secondary | ICD-10-CM

## 2021-04-26 LAB — CBC WITH DIFFERENTIAL/PLATELET
Basophils Absolute: 0.1 10*3/uL (ref 0.0–0.1)
Basophils Relative: 1.1 % (ref 0.0–3.0)
Eosinophils Absolute: 0.2 10*3/uL (ref 0.0–0.7)
Eosinophils Relative: 1.5 % (ref 0.0–5.0)
HCT: 41 % (ref 39.0–52.0)
Hemoglobin: 14 g/dL (ref 13.0–17.0)
Lymphocytes Relative: 26.9 % (ref 12.0–46.0)
Lymphs Abs: 2.8 10*3/uL (ref 0.7–4.0)
MCHC: 34.3 g/dL (ref 30.0–36.0)
MCV: 98.9 fl (ref 78.0–100.0)
Monocytes Absolute: 1.1 10*3/uL — ABNORMAL HIGH (ref 0.1–1.0)
Monocytes Relative: 10.2 % (ref 3.0–12.0)
Neutro Abs: 6.2 10*3/uL (ref 1.4–7.7)
Neutrophils Relative %: 60.3 % (ref 43.0–77.0)
Platelets: 235 10*3/uL (ref 150.0–400.0)
RBC: 4.14 Mil/uL — ABNORMAL LOW (ref 4.22–5.81)
RDW: 13.7 % (ref 11.5–15.5)
WBC: 10.4 10*3/uL (ref 4.0–10.5)

## 2021-04-26 LAB — BASIC METABOLIC PANEL
BUN: 17 mg/dL (ref 6–23)
CO2: 29 mEq/L (ref 19–32)
Calcium: 9.3 mg/dL (ref 8.4–10.5)
Chloride: 103 mEq/L (ref 96–112)
Creatinine, Ser: 0.86 mg/dL (ref 0.40–1.50)
GFR: 83.83 mL/min (ref >60.00)
Glucose, Bld: 82 mg/dL (ref 70–99)
Potassium: 4.1 mEq/L (ref 3.5–5.1)
Sodium: 139 mEq/L (ref 135–145)

## 2021-04-26 LAB — HEPATIC FUNCTION PANEL
ALT: 11 U/L (ref 0–53)
AST: 14 U/L (ref 0–37)
Albumin: 4.3 g/dL (ref 3.5–5.2)
Alkaline Phosphatase: 67 U/L (ref 39–117)
Bilirubin, Direct: 0.3 mg/dL (ref 0.0–0.3)
Total Bilirubin: 1.4 mg/dL — ABNORMAL HIGH (ref 0.2–1.2)
Total Protein: 7 g/dL (ref 6.0–8.3)

## 2021-04-26 LAB — LIPID PANEL
Cholesterol: 100 mg/dL (ref 0–200)
HDL: 43.5 mg/dL (ref >39.00)
LDL Cholesterol: 49 mg/dL (ref 0–99)
NonHDL: 56.92
Total CHOL/HDL Ratio: 2
Triglycerides: 40 mg/dL (ref 0.0–149.0)
VLDL: 8 mg/dL (ref 0.0–40.0)

## 2021-04-26 LAB — TSH: TSH: 0.93 u[IU]/mL (ref 0.35–5.50)

## 2021-04-26 LAB — MAGNESIUM: Magnesium: 2 mg/dL (ref 1.5–2.5)

## 2021-04-26 LAB — PSA: PSA: 8.86 ng/mL — ABNORMAL HIGH (ref 0.10–4.00)

## 2021-04-26 MED ORDER — AZELASTINE HCL 0.1 % NA SOLN
2.0000 | Freq: Two times a day (BID) | NASAL | 1 refills | Status: DC
Start: 2021-04-26 — End: 2023-09-16

## 2021-04-26 NOTE — Progress Notes (Signed)
Subjective:  Patient ID: Blake [name], male    DOB: 1946/06/22  Age: 75 y.o. MRN: [id number]  CC: Annual Exam, Atrial Fibrillation, and Hyperlipidemia  This visit occurred during the SARS-CoV-2 public health emergency.  Safety protocols were in place, including screening questions prior to the visit, additional usage of staff PPE, and extensive cleaning of exam room while observing appropriate contact time as indicated for disinfecting solutions.    HPI [name] presents for a CPX.  For 20 years he has had the sensation that he is gasping, choking or aspirating while he is asleep.  He says over the last few months it has worsened with respect to frequency.  His wife has noticed snoring.[name]  He will eventually be able to fall back asleep.  He also complains of nocturnal leg cramps. He complains of a chronic runny nose.  Outpatient Medications Prior to Visit  Medication Sig Dispense Refill   alfuzosin (UROXATRAL) 10 MG 24 hr tablet Take 10 mg by mouth daily with breakfast.     atorvastatin (LIPITOR) 80 MG tablet Take 1 tablet (80 mg total) by mouth daily. 90 tablet 1   ELIQUIS 5 MG TABS tablet TAKE ONE TABLET BY MOUTH TWICE A DAY 180 tablet 1   metoprolol tartrate (LOPRESSOR) 50 MG tablet TAKE ONE TABLET BY MOUTH TWICE A DAY 60 tablet 1   polyethylene glycol powder (GLYCOLAX/MIRALAX) 17 GM/SCOOP powder Take 255 g by mouth daily. 255 g 3   No facility-administered medications prior to visit.    ROS Review of Systems  Constitutional:  Negative for appetite change, chills, diaphoresis, fatigue and fever.  HENT:  Positive for postnasal drip and rhinorrhea. Negative for congestion, sinus pressure, sore throat and trouble swallowing.   Respiratory:  Positive for apnea. Negative for cough, shortness of breath and wheezing.   Cardiovascular:  Negative for chest pain, palpitations and leg swelling.  Gastrointestinal:  Positive for constipation. Negative for abdominal pain, diarrhea,  nausea and vomiting.  Genitourinary: Negative.  Negative for difficulty urinating.       ++ nocturia  Musculoskeletal: Negative.  Negative for back pain and myalgias.  Skin: Negative.  Negative for color change and rash.  Neurological: Negative.  Negative for dizziness, weakness and light-headedness.  Hematological:  Negative for adenopathy. Does not bruise/bleed easily.  Psychiatric/Behavioral: Negative.     Objective:  BP 116/66 (BP Location: Left Arm, Patient Position: Sitting, Cuff Size: Large)   Pulse 75   Temp 98.2 F (36.8 C) (Oral)   Ht 5' 5.98" (1.676 m)   Wt 173 lb (78.5 kg)   SpO2 97%   BMI 27.94 kg/m   BP Readings from Last 3 Encounters:  04/26/21 116/66  03/09/21 102/70  12/14/20 118/78    Wt Readings from Last 3 Encounters:  04/26/21 173 lb (78.5 kg)  03/09/21 169 lb (76.7 kg)  12/14/20 200 lb (90.7 kg)    Physical Exam Vitals reviewed.  Constitutional:      Appearance: Normal appearance.  HENT:     Nose: Nose normal.     Mouth/Throat:     Mouth: Mucous membranes are moist.  Eyes:     General: No scleral icterus.    Conjunctiva/sclera: Conjunctivae normal.  Cardiovascular:     Rate and Rhythm: Normal rate. Rhythm irregularly irregular.     Pulses: Normal pulses.     Heart sounds: No murmur heard. Pulmonary:     Effort: Pulmonary effort is normal.     Breath sounds: No  stridor. No wheezing, rhonchi or rales.  Abdominal:     General: Abdomen is flat.     Palpations: There is no mass.     Tenderness: There is no abdominal tenderness. There is no guarding.  Musculoskeletal:        General: Normal range of motion.     Cervical back: Neck supple.     Right lower leg: No edema.     Left lower leg: No edema.  Lymphadenopathy:     Cervical: No cervical adenopathy.  Skin:    General: Skin is warm and dry.  Neurological:     General: No focal deficit present.     Mental Status: He is alert.  Psychiatric:        Mood and Affect: Mood normal.         Behavior: Behavior normal.    Lab Results  Component Value Date   WBC 10.4 04/26/2021   HGB 14.0 04/26/2021   HCT 41.0 04/26/2021   PLT 235.0 04/26/2021   GLUCOSE 82 04/26/2021   CHOL 100 04/26/2021   TRIG 40.0 04/26/2021   HDL 43.50 04/26/2021   LDLCALC 49 04/26/2021   ALT 11 04/26/2021   AST 14 04/26/2021   NA 139 04/26/2021   K 4.1 04/26/2021   CL 103 04/26/2021   CREATININE 0.86 04/26/2021   BUN 17 04/26/2021   CO2 29 04/26/2021   TSH 0.93 04/26/2021   PSA 8.86 (H) 04/26/2021   INR 1.05 01/29/2017   HGBA1C 5.8 11/07/2015    US Abdomen Complete  Result Date: 01/26/2020 CLINICAL DATA:  Abdominal bloating, constipation EXAM: ABDOMEN ULTRASOUND COMPLETE COMPARISON:  None. FINDINGS: Gallbladder: No gallstones or wall thickening visualized. No sonographic Murphy sign noted by sonographer. Common bile duct: Diameter: 6 mm Liver: No focal lesion identified. Within normal limits in parenchymal echogenicity. Portal vein is patent on color Doppler imaging with normal direction of blood flow towards the liver. IVC: Obscured by bowel gas. Pancreas: Obscured by bowel gas. Spleen: Poorly visualized, partially obscured by bowel gas. Right Kidney: Length: 10.7 cm. Echogenicity within normal limits. No mass or hydronephrosis visualized. Left Kidney: Length: 11.0 cm. Echogenicity within normal limits. No mass or hydronephrosis visualized. Abdominal aorta: Obscured by bowel gas. Other findings: None. IMPRESSION: Examination is very limited by bowel gas throughout. Within this limitation, no ultrasound findings to explain constipation or bloating. Electronically Signed   By: Eddie Candle M.D.   On: 01/26/2020 16:35    Assessment & Plan:   Azan was seen today for annual exam, atrial fibrillation and hyperlipidemia.  Diagnoses and all orders for this visit:  Loud snoring -     Ambulatory referral to Sleep Studies  Atrial fibrillation, chronic ([place])- He has good rate control. Will continue  the DOAC. -     CBC with Differential/Platelet; Future -     TSH; Future -     TSH -     CBC with Differential/Platelet  Chronic idiopathic constipation- Sx's are well controlled with miralax. -     CBC with Differential/Platelet; Future -     TSH; Future -     TSH -     CBC with Differential/Platelet  Routine general medical examination at a health care facility- Exam completed, labs reviewed, vaccines reviewed, no cancer screenings are indicated, patient education material was given.  PSA elevation- His PSA has risen. Will refer to urology. -     PSA; Future -     PSA -  Ambulatory referral to Urology  Nocturnal leg cramps- Labs are negative for secondary causes. I recommend a TBSP of yellow mustard qHS. -     CBC with Differential/Platelet; Future -     Basic metabolic panel; Future -     Magnesium; Future -     Magnesium -     Basic metabolic panel -     CBC with Differential/Platelet  Dyslipidemia, goal LDL below 70- LDL goal achieved. Doing well on the statin  -     Lipid panel; Future -     TSH; Future -     Hepatic function panel; Future -     Hepatic function panel -     TSH -     Lipid panel  Vasomotor rhinitis -     azelastine (ASTELIN) 0.1 % nasal spray; Place 2 sprays into both nostrils 2 (two) times daily. Use in each nostril as directed  I am having [name] "Ken" start on azelastine. I am also having him maintain his alfuzosin, polyethylene glycol powder, Eliquis, metoprolol tartrate, and atorvastatin.  Meds ordered this encounter  Medications   azelastine (ASTELIN) 0.1 % nasal spray    Sig: Place 2 sprays into both nostrils 2 (two) times daily. Use in each nostril as directed    Dispense:  90 mL    Refill:  1      Follow-up: Return in about 6 months (around 10/27/2021).  Scarlette Calico, MD

## 2021-04-26 NOTE — Patient Instructions (Signed)
Health Maintenance, Male Adopting a healthy lifestyle and getting preventive care are important in promoting health and wellness. Ask your health care provider about: The right schedule for you to have regular tests and exams. Things you can do on your own to prevent diseases and keep yourself healthy. What should I know about diet, weight, and exercise? Eat a healthy diet  Eat a diet that includes plenty of vegetables, fruits, low-fat dairy products, and lean protein. Do not eat a lot of foods that are high in solid fats, added sugars, or sodium.  Maintain a healthy weight Body mass index (BMI) is a measurement that can be used to identify possible weight problems. It estimates body fat based on height and weight. Your health care provider can help determine your BMI and help you achieve or maintain ahealthy weight. Get regular exercise Get regular exercise. This is one of the most important things you can do for your health. Most adults should: Exercise for at least 150 minutes each week. The exercise should increase your heart rate and make you sweat (moderate-intensity exercise). Do strengthening exercises at least twice a week. This is in addition to the moderate-intensity exercise. Spend less time sitting. Even light physical activity can be beneficial. Watch cholesterol and blood lipids Have your blood tested for lipids and cholesterol at 77 years of age, then havethis test every 5 years. You may need to have your cholesterol levels checked more often if: Your lipid or cholesterol levels are high. You are older than 77 years of age. You are at high risk for heart disease. What should I know about cancer screening? Many types of cancers can be detected early and may often be prevented. Depending on your health history and family history, you may need to have cancer screening at various ages. This may include screening for: Colorectal cancer. Prostate cancer. Skin cancer. Lung  cancer. What should I know about heart disease, diabetes, and high blood pressure? Blood pressure and heart disease High blood pressure causes heart disease and increases the risk of stroke. This is more likely to develop in people who have high blood pressure readings, are of African descent, or are overweight. Talk with your health care provider about your target blood pressure readings. Have your blood pressure checked: Every 3-5 years if you are 18-39 years of age. Every year if you are 40 years old or older. If you are between the ages of 65 and 75 and are a current or former smoker, ask your health care provider if you should have a one-time screening for abdominal aortic aneurysm (AAA). Diabetes Have regular diabetes screenings. This checks your fasting blood sugar level. Have the screening done: Once every three years after age 45 if you are at a normal weight and have a low risk for diabetes. More often and at a younger age if you are overweight or have a high risk for diabetes. What should I know about preventing infection? Hepatitis B If you have a higher risk for hepatitis B, you should be screened for this virus. Talk with your health care provider to find out if you are at risk forhepatitis B infection. Hepatitis C Blood testing is recommended for: Everyone born from 1945 through 1965. Anyone with known risk factors for hepatitis C. Sexually transmitted infections (STIs) You should be screened each year for STIs, including gonorrhea and chlamydia, if: You are sexually active and are younger than 77 years of age. You are older than 77 years of age   and your health care provider tells you that you are at risk for this type of infection. Your sexual activity has changed since you were last screened, and you are at increased risk for chlamydia or gonorrhea. Ask your health care provider if you are at risk. Ask your health care provider about whether you are at high risk for HIV.  Your health care provider may recommend a prescription medicine to help prevent HIV infection. If you choose to take medicine to prevent HIV, you should first get tested for HIV. You should then be tested every 3 months for as long as you are taking the medicine. Follow these instructions at home: Lifestyle Do not use any products that contain nicotine or tobacco, such as cigarettes, e-cigarettes, and chewing tobacco. If you need help quitting, ask your health care provider. Do not use street drugs. Do not share needles. Ask your health care provider for help if you need support or information about quitting drugs. Alcohol use Do not drink alcohol if your health care provider tells you not to drink. If you drink alcohol: Limit how much you have to 0-2 drinks a day. Be aware of how much alcohol is in your drink. In the [place] one drink equals one 12 oz bottle of beer (355 mL), one 5 oz glass of wine (148 mL), or one 1 oz glass of hard liquor (44 mL). General instructions Schedule regular health, dental, and eye exams. Stay current with your vaccines. Tell your health care provider if: You often feel depressed. You have ever been abused or do not feel safe at home. Summary Adopting a healthy lifestyle and getting preventive care are important in promoting health and wellness. Follow your health care provider's instructions about healthy diet, exercising, and getting tested or screened for diseases. Follow your health care provider's instructions on monitoring your cholesterol and blood pressure. This information is not intended to replace advice given to you by your health care provider. Make sure you discuss any questions you have with your healthcare provider. Document Revised: 10/01/2018 Document Reviewed: 10/01/2018 Elsevier Patient Education  2022 Elsevier Inc.  

## 2021-05-12 ENCOUNTER — Other Ambulatory Visit: Payer: Self-pay | Admitting: Cardiology

## 2021-06-02 ENCOUNTER — Telehealth: Payer: Self-pay | Admitting: Pharmacist

## 2021-06-02 NOTE — Progress Notes (Addendum)
    Chronic Care Management Pharmacy Assistant   Name: Blake [name]  MRN: [id number] DOB: February 11, 1946  Reason for Encounter: Disease State - General Adherence  Recent office visits:  04/26/21 Ronnald Ramp (PCP) - Annual Exam. Start Azelastine HCl 0.1%.  Recent consult visits:  None listed  Hospital visits:  None in previous 6 months  Medications: Outpatient Encounter Medications as of 06/02/2021  Medication Sig   alfuzosin (UROXATRAL) 10 MG 24 hr tablet Take 10 mg by mouth daily with breakfast.   atorvastatin (LIPITOR) 80 MG tablet Take 1 tablet (80 mg total) by mouth daily.   azelastine (ASTELIN) 0.1 % nasal spray Place 2 sprays into both nostrils 2 (two) times daily. Use in each nostril as directed   ELIQUIS 5 MG TABS tablet TAKE ONE TABLET BY MOUTH TWICE A DAY   metoprolol tartrate (LOPRESSOR) 50 MG tablet TAKE ONE TABLET BY MOUTH TWICE A DAY   polyethylene glycol powder (GLYCOLAX/MIRALAX) 17 GM/SCOOP powder Take 255 g by mouth daily.   No facility-administered encounter medications on file as of 06/02/2021.   Attempted contact with [name] 3 times on 8/23/, 8/30/ & 8/31. Unsuccessful outreach, will attempt again next month.     Star Rating Drugs: Atorvastatin - last fill 03/30/21 [place], [place] Clinical Pharmacists Assistant [phone]

## 2021-06-16 ENCOUNTER — Telehealth: Payer: Self-pay | Admitting: Internal Medicine

## 2021-06-16 NOTE — Telephone Encounter (Signed)
Patient called in   Current patient of Dr. Ronnald Ramp but would like to have his care transferred to Dr. Maudry Mayhew patient I would send note to providers advising of his request

## 2021-06-16 NOTE — Telephone Encounter (Signed)
Ok with me as well, thanks

## 2021-06-19 ENCOUNTER — Ambulatory Visit: Payer: Medicare Other | Admitting: Internal Medicine

## 2021-06-23 ENCOUNTER — Ambulatory Visit: Payer: Medicare Other | Admitting: Internal Medicine

## 2021-06-27 ENCOUNTER — Encounter: Payer: Self-pay | Admitting: Internal Medicine

## 2021-06-27 ENCOUNTER — Other Ambulatory Visit: Payer: Self-pay

## 2021-06-27 ENCOUNTER — Ambulatory Visit (INDEPENDENT_AMBULATORY_CARE_PROVIDER_SITE_OTHER): Payer: Medicare Other | Admitting: Internal Medicine

## 2021-06-27 VITALS — BP 110/72 | HR 99 | Temp 98.3°F | Ht 65.98 in | Wt 179.0 lb

## 2021-06-27 DIAGNOSIS — F419 Anxiety disorder, unspecified: Secondary | ICD-10-CM | POA: Insufficient documentation

## 2021-06-27 DIAGNOSIS — R1310 Dysphagia, unspecified: Secondary | ICD-10-CM

## 2021-06-27 DIAGNOSIS — E785 Hyperlipidemia, unspecified: Secondary | ICD-10-CM

## 2021-06-27 DIAGNOSIS — J300 Vasomotor rhinitis: Secondary | ICD-10-CM | POA: Diagnosis not present

## 2021-06-27 DIAGNOSIS — Z7901 Long term (current) use of anticoagulants: Secondary | ICD-10-CM

## 2021-06-27 DIAGNOSIS — R972 Elevated prostate specific antigen [PSA]: Secondary | ICD-10-CM

## 2021-06-27 DIAGNOSIS — I482 Chronic atrial fibrillation, unspecified: Secondary | ICD-10-CM

## 2021-06-27 MED ORDER — ATORVASTATIN CALCIUM 40 MG PO TABS: 40.0000 mg | ORAL_TABLET | Freq: Two times a day (BID) | ORAL | 3 refills | Status: DC

## 2021-06-27 NOTE — Assessment & Plan Note (Signed)
Benign and stable per pt - for urology f/u next mo as planned

## 2021-06-27 NOTE — Assessment & Plan Note (Signed)
Mild situational, likely ongoing for some time, and part of reason for transfer of care I suspect, declines other change in tx for now, reassured

## 2021-06-27 NOTE — Assessment & Plan Note (Signed)
Pt enouraged compliance with nasal astelin

## 2021-06-27 NOTE — Assessment & Plan Note (Signed)
Cont eilquis, no overt bleeding

## 2021-06-27 NOTE — Patient Instructions (Signed)
Ok to change the lipitor to 40 mg x 2 per day to avoid the large pill that causes difficulty swallowing  Please continue all other medications as before, and refills have been done if requested.  Please have the pharmacy call with any other refills you may need.  Please continue your efforts at being more active, low cholesterol diet, and weight control.  Please keep your appointments with your specialists as you may have planned  Please make an Appointment to return in 6 months, or sooner if needed

## 2021-06-27 NOTE — Assessment & Plan Note (Signed)
Minor, likely functional, declines gi referral

## 2021-06-27 NOTE — Progress Notes (Signed)
Patient ID: Blake [name], male   DOB: 1945-12-13, 75 y.o.   MRN: [id number]         Chief Complaint::  transfer of care (Difficulty with drinking liquid/Patient c/o having a "sore knot" on his back)         HPI:  Blake [name] is a 75 y.o. male here for transfer of care - declines covid booster and flu shot, o/w up to date with preventive referrals and immunizations                        Also has a sebacious cyst to the left upper back with infection recent, expressed well by his anesthesiologist soon a few days ago, now better, declines general surgury referral.  Denies urinary symptoms such as dysuria, frequency, urgency, flank pain, hematuria or n/v, fever, chills, and has PSA normally in the 7-8 range, and has f/u with urology next month.  Also has worsening sinus allergy congestion with post nasal gtt and awoke twice recently with pooling of the congestion and could not breathe for maybe a minute it seemed before it could be cleared and really spooked him.  Also c/o dysphagia to rice and cornbread only maybe 3 times in the past yr, no other difficulty with other solids or liquids, though the large lipitor 80 mg pill is usually difficult to get down.  Denies worsening reflux, abd pain, dysphagia, n/v, bowel change or blood.   Pt denies polydipsia, polyuria, or new focal neuro s/s.      Pt denies fever, wt loss, night sweats, loss of appetite, or other constitutional symptoms  Denies worsening depressive symptoms, suicidal ideation, or panic; has ongoing anxiety   Wt Readings from Last 3 Encounters:  06/27/21 179 lb (81.2 kg)  04/26/21 173 lb (78.5 kg)  03/09/21 169 lb (76.7 kg)   BP Readings from Last 3 Encounters:  06/27/21 110/72  04/26/21 116/66  03/09/21 102/70   Immunization History  Administered Date(s) Administered   Influenza Whole 07/29/2008   Influenza, High Dose Seasonal PF 08/07/2014, 09/28/2017, 07/17/2018, 08/17/2019   Influenza,inj,Quad PF,6+ Mos 06/28/2016    Influenza-Unspecified 07/22/2013, 08/18/2015, 08/22/2016   PFIZER(Purple Top)SARS-COV-2 Vaccination 11/12/2019, 12/04/2019, 07/25/2020   Pneumococcal Conjugate-13 02/27/2017   Pneumococcal Polysaccharide-23 07/17/2018   Tdap 06/07/2020   Tetanus 12/11/2008   Zoster Recombinat (Shingrix) 06/30/2020, 12/09/2020   Zoster, Live 07/22/2013   There are no preventive care reminders to display for this patient.     Past Medical History:  Diagnosis Date   Angina    none since CABG   Arthritis    Bladder spasm    takes Levsin as needed   CAD (coronary artery disease)    Diverticulosis    Duodenal ulcer    hx of   Dysrhythmia    atrial fib post Cabg/ takes Eliquis daily as well as Metoprolol    GERD (gastroesophageal reflux disease)    takes Protonix daily   History of blood transfusion    not abnormal reaction   History of colon polyps    benign   History of kidney stones    hx of   Hyperlipidemia    takes Atorvastatin daily   HYPERLIPIDEMIA 05/25/2009   Joint pain    Nocturia    PAF (paroxysmal atrial fibrillation) ([place])    Past Surgical History:  Procedure Laterality Date   CARDIAC CATHETERIZATION     6/12   COLONOSCOPY     CORONARY ARTERY BYPASS  GRAFT  03/27/2011   x3, Dr Tharon Aquas Trigt     ESOPHAGOGASTRODUODENOSCOPY ENDOSCOPY  04/12/11   cauterization of bleeding ulcer  and artery in duodenum and stomach    INGUINAL HERNIA REPAIR  11/06/2011   Procedure: HERNIA REPAIR INGUINAL ADULT;  Surgeon: Earnstine Regal, MD;  Location: [facility];  Service: General;  Laterality: N/A;  Repair left inguinal hernia with mesh   WISDOM TOOTH EXTRACTION      reports that he quit smoking about 33 years ago. His smoking use included cigarettes. He has a 25.00 pack-year smoking history. He has never used smokeless tobacco. He reports current alcohol use of about 1.0 standard drink per week. He reports that he does not use drugs. family history includes Atrial fibrillation in his brother; Cancer  in his father; Coronary artery disease in his father; Diabetes in his sister; Heart attack (age of onset: 61) in his sister; Heart attack (age of onset: 46) in his father; Lymphoma in an other family member; Ovarian cancer in his sister. No Known Allergies Current Outpatient Medications on File Prior to Visit  Medication Sig Dispense Refill   alfuzosin (UROXATRAL) 10 MG 24 hr tablet Take 10 mg by mouth daily with breakfast.     azelastine (ASTELIN) 0.1 % nasal spray Place 2 sprays into both nostrils 2 (two) times daily. Use in each nostril as directed 90 mL 1   ELIQUIS 5 MG TABS tablet TAKE ONE TABLET BY MOUTH TWICE A DAY 180 tablet 1   metoprolol tartrate (LOPRESSOR) 50 MG tablet TAKE ONE TABLET BY MOUTH TWICE A DAY 60 tablet 1   No current facility-administered medications on file prior to visit.        ROS:  All others reviewed and negative.  Objective        PE:  BP 110/72 (BP Location: Right Arm, Patient Position: Sitting, Cuff Size: Large)   Pulse 99   Temp 98.3 F (36.8 C) (Oral)   Ht 5' 5.98" (1.676 m)   Wt 179 lb (81.2 kg)   SpO2 97%   BMI 28.91 kg/m                 Constitutional: Pt appears in NAD               HENT: Head: NCAT.                Right Ear: External ear normal.                 Left Ear: External ear normal.                Eyes: . Pupils are equal, round, and reactive to light. Conjunctivae and EOM are normal               Nose: without d/c or deformity               Neck: Neck supple. Gross normal ROM               Cardiovascular: Normal rate and irregular rhythm.                 Pulmonary/Chest: Effort normal and breath sounds without rales or wheezing.                Abd:  Soft, NT, ND, + BS, no organomegaly               Neurological: Pt is alert. At baseline orientation, motor  grossly intact               Skin: Skin is warm. No rashes, no other new lesions, LE edema - none               Psychiatric: Pt behavior is normal without agitation , 2+  nervous  Micro: none  Cardiac tracings I have personally interpreted today:  none  Pertinent Radiological findings (summarize): none   Lab Results  Component Value Date   WBC 10.4 04/26/2021   HGB 14.0 04/26/2021   HCT 41.0 04/26/2021   PLT 235.0 04/26/2021   GLUCOSE 82 04/26/2021   CHOL 100 04/26/2021   TRIG 40.0 04/26/2021   HDL 43.50 04/26/2021   LDLCALC 49 04/26/2021   ALT 11 04/26/2021   AST 14 04/26/2021   NA 139 04/26/2021   K 4.1 04/26/2021   CL 103 04/26/2021   CREATININE 0.86 04/26/2021   BUN 17 04/26/2021   CO2 29 04/26/2021   TSH 0.93 04/26/2021   PSA 8.86 (H) 04/26/2021   INR 1.05 01/29/2017   HGBA1C 5.8 11/07/2015   Assessment/Plan:  JERAMIHA SPANEL is a 75 y.o. White or Caucasian [1] male with  has a past medical history of Angina, Arthritis, Bladder spasm, CAD (coronary artery disease), Diverticulosis, Duodenal ulcer, Dysrhythmia, GERD (gastroesophageal reflux disease), History of blood transfusion, History of colon polyps, History of kidney stones, Hyperlipidemia, HYPERLIPIDEMIA (05/25/2009), Joint pain, Nocturia, and PAF (paroxysmal atrial fibrillation) ([place]).  Atrial fibrillation, chronic (HCC) Stable rate and volume, cont current med tx - lopressor  Vasomotor rhinitis Pt enouraged compliance with nasal astelin  PSA elevation Benign and stable per pt - for urology f/u next mo as planned  Dysphagia Minor, likely functional, declines gi referral  Dyslipidemia, goal LDL below 70 Difficult to tolerate the lipitor 80 pill due to size, ok for lipitor 40 bid,  to f/u any worsening symptoms or concerns  Chronic anticoagulation Cont eilquis, no overt bleeding  Anxiety Mild situational, likely ongoing for some time, and part of reason for transfer of care I suspect, declines other change in tx for now, reassured  Followup: Return in about 6 months (around 12/25/2021).  Cathlean Cower, MD 06/27/2021 9:20 PM [facility]

## 2021-06-27 NOTE — Assessment & Plan Note (Signed)
Stable rate and volume, cont current med tx - lopressor

## 2021-06-27 NOTE — Assessment & Plan Note (Signed)
Difficult to tolerate the lipitor 80 pill due to size, ok for lipitor 40 bid,  to f/u any worsening symptoms or concerns

## 2021-06-30 ENCOUNTER — Telehealth: Payer: Self-pay

## 2021-07-03 DIAGNOSIS — R350 Frequency of micturition: Secondary | ICD-10-CM | POA: Diagnosis not present

## 2021-07-03 DIAGNOSIS — R972 Elevated prostate specific antigen [PSA]: Secondary | ICD-10-CM | POA: Diagnosis not present

## 2021-07-03 DIAGNOSIS — N3281 Overactive bladder: Secondary | ICD-10-CM | POA: Diagnosis not present

## 2021-07-03 DIAGNOSIS — N401 Enlarged prostate with lower urinary tract symptoms: Secondary | ICD-10-CM | POA: Diagnosis not present

## 2021-07-11 DIAGNOSIS — H52203 Unspecified astigmatism, bilateral: Secondary | ICD-10-CM | POA: Diagnosis not present

## 2021-07-17 NOTE — Telephone Encounter (Signed)
PA submitted to plan

## 2021-07-21 ENCOUNTER — Other Ambulatory Visit: Payer: Self-pay | Admitting: Cardiology

## 2021-07-21 NOTE — Telephone Encounter (Signed)
Prescription refill request for Eliquis received. Indication:atrial fib Last office visit:upcoming Scr:0.8 Age: 75 Weight:81.2 kg  Prescription refilled

## 2021-07-25 ENCOUNTER — Other Ambulatory Visit: Payer: Self-pay | Admitting: Cardiology

## 2021-07-31 DIAGNOSIS — R350 Frequency of micturition: Secondary | ICD-10-CM | POA: Diagnosis not present

## 2021-07-31 DIAGNOSIS — R3915 Urgency of urination: Secondary | ICD-10-CM | POA: Diagnosis not present

## 2021-07-31 DIAGNOSIS — N401 Enlarged prostate with lower urinary tract symptoms: Secondary | ICD-10-CM | POA: Diagnosis not present

## 2021-07-31 DIAGNOSIS — N3281 Overactive bladder: Secondary | ICD-10-CM | POA: Diagnosis not present

## 2021-08-02 NOTE — Progress Notes (Signed)
Cardiology Office Note:    Date:  08/03/2021   ID:  Blake, [name] 12-06-1945, MRN [id number]  PCP:  Biagio Borg, MD [place] Cardiologist: Kirk Ruths, MD   Reason for visit: 1 year follow-up  History of Present Illness:    Blake [name] is a 75 y.o. male with a hx of coronary artery disease status post CABG in 2012 (LIMA-LAD, SVG-D1 and SVG-Marginal),  permanent atrial fibrillation, mitral regurgitation.    He was last seen by Dr. Stanford Breed in February 2021 and was doing well with no dyspnea or chest pain or palpitations.  Today, he mentions he had COVID 3 to 4 weeks ago with primary symptom of fatigue.  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  Now he feels worn out after 3 to 4 hours.  He wonders if that could be cardiac related.  He states before his bypass surgery in 2012, he had dyspnea on exertion.  Today, he denies any dyspnea, chest pain, palpitations, lightheadedness, syncope, bleeding, PND, orthopnea and lower extremity edema.  He mentions he goes into these 3 months spurts of a very aggressive diet of eating 1 meal per day.  He had gotten his weight down to 160 pounds (30 pound weight loss) but has gained most of it back.      Past Medical History:  Diagnosis Date   Angina    none since CABG   Arthritis    Bladder spasm    takes Levsin as needed   CAD (coronary artery disease)    Diverticulosis    Duodenal ulcer    hx of   Dysrhythmia    atrial fib post Cabg/ takes Eliquis daily as well as Metoprolol    GERD (gastroesophageal reflux disease)    takes Protonix daily   History of blood transfusion    not abnormal reaction   History of colon polyps    benign   History of kidney stones    hx of   Hyperlipidemia    takes Atorvastatin daily   HYPERLIPIDEMIA 05/25/2009   Joint pain    Nocturia    PAF (paroxysmal atrial fibrillation) ([place])     Past Surgical History:  Procedure Laterality Date    CARDIAC CATHETERIZATION     6/12   COLONOSCOPY     CORONARY ARTERY BYPASS GRAFT  03/27/2011   x3, Dr Tharon Aquas Trigt     ESOPHAGOGASTRODUODENOSCOPY ENDOSCOPY  04/12/11   cauterization of bleeding ulcer  and artery in duodenum and stomach    INGUINAL HERNIA REPAIR  11/06/2011   Procedure: HERNIA REPAIR INGUINAL ADULT;  Surgeon: Earnstine Regal, MD;  Location: [facility];  Service: General;  Laterality: N/A;  Repair left inguinal hernia with mesh   WISDOM TOOTH EXTRACTION      Current Medications: Current Meds  Medication Sig   alfuzosin (UROXATRAL) 10 MG 24 hr tablet Take 10 mg by mouth daily with breakfast.   atorvastatin (LIPITOR) 40 MG tablet Take 1 tablet (40 mg total) by mouth in the morning and at bedtime.   azelastine (ASTELIN) 0.1 % nasal spray Place 2 sprays into both nostrils 2 (two) times daily. Use in each nostril as directed   ELIQUIS 5 MG TABS tablet TAKE ONE TABLET BY MOUTH TWICE A DAY   metoprolol tartrate (LOPRESSOR) 50 MG tablet TAKE ONE TABLET BY MOUTH TWICE A DAY     Allergies:  Patient has no known allergies.   Social History   Socioeconomic History   Marital status: Married    Spouse name: Not on file   Number of children: 4   Years of education: Not on file   Highest education level: Not on file  Occupational History   Occupation: retired   Tobacco Use   Smoking status: Former    Packs/day: 1.00    Years: 25.00    Pack years: 25.00    Types: Cigarettes    Quit date: 03/04/1988    Years since quitting: 33.4   Smokeless tobacco: Never  Vaping Use   Vaping Use: Never used  Substance and Sexual Activity   Alcohol use: Yes    Alcohol/week: 1.0 standard drink    Types: 1 Cans of beer per week    Comment: occ   Drug use: No   Sexual activity: Not on file  Other Topics Concern   Not on file  Social History Narrative   Regular exercise- yes    Social Determinants of Health   Financial Resource Strain: Low Risk    Difficulty of Paying Living Expenses:  Not hard at all  Food Insecurity: No Food Insecurity   Worried About Charity fundraiser in the Last Year: Never true   Ran Out of Food in the Last Year: Never true  Transportation Needs: No Transportation Needs   Lack of Transportation (Medical): No   Lack of Transportation (Non-Medical): No  Physical Activity: Sufficiently Active   Days of Exercise per Week: 5 days   Minutes of Exercise per Session: 30 min  Stress: No Stress Concern Present   Feeling of Stress : Not at all  Social Connections: Socially Integrated   Frequency of Communication with Friends and Family: More than three times a week   Frequency of Social Gatherings with Friends and Family: Once a week   Attends Religious Services: More than 4 times per year   Active Member of [employer] or Organizations: Yes   Attends Music therapist: More than 4 times per year   Marital Status: Married     Family History: The patient's family history includes Atrial fibrillation in his brother; Cancer in his father; Coronary artery disease in his father; Diabetes in his sister; Heart attack (age of onset: 51) in his sister; Heart attack (age of onset: 107) in his father; Lymphoma in an other family member; Ovarian cancer in his sister. There is no history of Colon cancer or Stomach cancer.  ROS:   Please see the history of present illness.     EKGs/Labs/Other Studies Reviewed:    EKG:  The ekg ordered today demonstrates atrial fibrillation, heart rate 83, QRS duration 92 ms  Recent Labs: 04/26/2021: ALT 11; BUN 17; Creatinine, Ser 0.86; Hemoglobin 14.0; Magnesium 2.0; Platelets 235.0; Potassium 4.1; Sodium 139; TSH 0.93   Recent Lipid Panel Lab Results  Component Value Date/Time   CHOL 100 04/26/2021 02:23 PM   TRIG 40.0 04/26/2021 02:23 PM   HDL 43.50 04/26/2021 02:23 PM   [place] 49 04/26/2021 02:23 PM    Physical Exam:    VS:  BP 118/72   Pulse 83   Ht 5\' 7"  (1.702 m)   Wt 188 lb 12.8 oz (85.6 kg)   SpO2 96%    BMI 29.57 kg/m    No data found.  Wt Readings from Last 3 Encounters:  08/03/21 188 lb 12.8 oz (85.6 kg)  06/27/21 179 lb (81.2 kg)  04/26/21 173 lb (78.5 kg)     GEN:  Well nourished, well developed in no acute distress HEENT: Normal NECK: No JVD; No carotid bruits CARDIAC: RRR, no murmurs, rubs, gallops RESPIRATORY:  Clear to auscultation without rales, wheezing or rhonchi  ABDOMEN: Soft, non-tender, non-distended MUSCULOSKELETAL: No edema; No deformity  SKIN: Warm and dry NEUROLOGIC:  Alert and oriented PSYCHIATRIC:  Normal affect     ASSESSMENT AND PLAN   CAD, no angina -Denies his anginal equivalent which is dyspnea on exertion -I believe his decreased exercise tolerance is secondary to COVID.  I recommend reevaluating in 2 months.  If his exercise tolerance has not improved, could consider nuclear stress testing. -I recommend a more balanced diet approach instead of these aggressive fasting short-term diets. -Continue metoprolol -Continue statin.  No aspirin given anticoagulation.  Permanent atrial fibrillation, rate controlled -Continue beta-blocker for rate control -Continue Eliquis for stroke prevention  Mitral regurgitation --2D echo March 2021 showed EF 55 to 60%, mildly dilated left and right atria, mild LVH, mild mitral regurgitation. -No heart failure symptoms  Hyperlipidemia with goal LDL <70 -LDL 49 in July 2022.  Continue Lipitor -Discussed cholesterol lowering diets - Mediterranean diet, DASH diet, vegetarian diet, low-carbohydrate diet and avoidance of trans fats.  Discussed healthier choice substitutes.  Nuts, high-fiber foods, and fiber supplements may also improve lipids.    Disposition - Follow-up in 2 months to reevaluate exercise tolerance.  If he feels back to his normal self then, told him okay to cancel 67-month appointment and see [place] back in 1 year.        Medication Adjustments/Labs and Tests Ordered: Current medicines are reviewed at  length with the patient today.  Concerns regarding medicines are outlined above.  Orders Placed This Encounter  Procedures   EKG 12-Lead   No orders of the defined types were placed in this encounter.   Patient Instructions  Medication Instructions:  No Changes *If you need a refill on your cardiac medications before your next appointment, please call your pharmacy*   Lab Work: No Labs If you have labs (blood work) drawn today and your tests are completely normal, you will receive your results only by: [place] (if you have MyChart) OR A paper copy in the mail If you have any lab test that is abnormal or we need to change your treatment, we will call you to review the results.   Testing/Procedures: No Testing   Follow-Up: At [facility], you and your health needs are our priority.  As part of our continuing mission to provide you with exceptional heart care, we have created designated Provider Care Teams.  These Care Teams include your primary Cardiologist (physician) and Advanced Practice Providers (APPs -  Physician Assistants and Nurse Practitioners) who all work together to provide you with the care you need, when you need it.  We recommend signing up for the patient portal called "MyChart".  Sign up information is provided on this After Visit Summary.  MyChart is used to connect with patients for Virtual Visits (Telemedicine).  Patients are able to view lab/test results, encounter notes, upcoming appointments, etc.  Non-urgent messages can be sent to your provider as well.   To learn more about what you can do with MyChart, go to [email or web address].    Your next appointment:   2 month(s)  The format for your next appointment:   In Person  Provider:   You may see Kirk Ruths, MD or one of the following  Advanced Practice Providers on your designated Care Team:   Sande Rives, [place] Coletta Memos, FNP    Signed, Gaston Islam  08/03/2021  12:56 PM    [place]

## 2021-08-03 ENCOUNTER — Other Ambulatory Visit: Payer: Self-pay

## 2021-08-03 ENCOUNTER — Encounter: Payer: Self-pay | Admitting: Physician Assistant

## 2021-08-03 ENCOUNTER — Ambulatory Visit: Payer: Medicare Other | Admitting: Physician Assistant

## 2021-08-03 VITALS — BP 118/72 | HR 83 | Ht 67.0 in | Wt 188.8 lb

## 2021-08-03 DIAGNOSIS — I340 Nonrheumatic mitral (valve) insufficiency: Secondary | ICD-10-CM | POA: Diagnosis not present

## 2021-08-03 DIAGNOSIS — I482 Chronic atrial fibrillation, unspecified: Secondary | ICD-10-CM

## 2021-08-03 DIAGNOSIS — I2510 Atherosclerotic heart disease of native coronary artery without angina pectoris: Secondary | ICD-10-CM

## 2021-08-03 DIAGNOSIS — E7800 Pure hypercholesterolemia, unspecified: Secondary | ICD-10-CM | POA: Diagnosis not present

## 2021-08-03 DIAGNOSIS — I10 Essential (primary) hypertension: Secondary | ICD-10-CM

## 2021-08-03 NOTE — Patient Instructions (Signed)
Medication Instructions:  No Changes *If you need a refill on your cardiac medications before your next appointment, please call your pharmacy*   Lab Work: No Labs If you have labs (blood work) drawn today and your tests are completely normal, you will receive your results only by: [place] (if you have MyChart) OR A paper copy in the mail If you have any lab test that is abnormal or we need to change your treatment, we will call you to review the results.   Testing/Procedures: No Testing   Follow-Up: At [facility], you and your health needs are our priority.  As part of our continuing mission to provide you with exceptional heart care, we have created designated Provider Care Teams.  These Care Teams include your primary Cardiologist (physician) and Advanced Practice Providers (APPs -  Physician Assistants and Nurse Practitioners) who all work together to provide you with the care you need, when you need it.  We recommend signing up for the patient portal called "MyChart".  Sign up information is provided on this After Visit Summary.  MyChart is used to connect with patients for Virtual Visits (Telemedicine).  Patients are able to view lab/test results, encounter notes, upcoming appointments, etc.  Non-urgent messages can be sent to your provider as well.   To learn more about what you can do with MyChart, go to [email or web address].    Your next appointment:   2 month(s)  The format for your next appointment:   In Person  Provider:   You may see Kirk Ruths, MD or one of the following Advanced Practice Providers on your designated Care Team:   [place], PA-C Coletta Memos, FNP

## 2021-10-02 DIAGNOSIS — R972 Elevated prostate specific antigen [PSA]: Secondary | ICD-10-CM | POA: Diagnosis not present

## 2021-10-02 NOTE — Progress Notes (Signed)
Cardiology Clinic Note   Patient Name: Blake [name] Date of Encounter: 10/02/2021  Primary Care Provider:  Biagio Borg, MD Primary Cardiologist:  Kirk Ruths, MD  Patient Profile    Blake [name] 75 year old male presents the clinic today for follow-up evaluation of his coronary artery disease.  Past Medical History    Past Medical History:  Diagnosis Date   Angina    none since CABG   Arthritis    Bladder spasm    takes Levsin as needed   CAD (coronary artery disease)    Diverticulosis    Duodenal ulcer    hx of   Dysrhythmia    atrial fib post Cabg/ takes Eliquis daily as well as Metoprolol    GERD (gastroesophageal reflux disease)    takes Protonix daily   History of blood transfusion    not abnormal reaction   History of colon polyps    benign   History of kidney stones    hx of   Hyperlipidemia    takes Atorvastatin daily   HYPERLIPIDEMIA 05/25/2009   Joint pain    Nocturia    PAF (paroxysmal atrial fibrillation) ([place])    Past Surgical History:  Procedure Laterality Date   CARDIAC CATHETERIZATION     6/12   COLONOSCOPY     CORONARY ARTERY BYPASS GRAFT  03/27/2011   x3, Dr Tharon Aquas Trigt     ESOPHAGOGASTRODUODENOSCOPY ENDOSCOPY  04/12/11   cauterization of bleeding ulcer  and artery in duodenum and stomach    INGUINAL HERNIA REPAIR  11/06/2011   Procedure: HERNIA REPAIR INGUINAL ADULT;  Surgeon: Earnstine Regal, MD;  Location: [facility];  Service: General;  Laterality: N/A;  Repair left inguinal hernia with mesh   WISDOM TOOTH EXTRACTION      Allergies  No Known Allergies  History of Present Illness    Blake [name] has a PMH of coronary artery disease status post CABG 2012 (LIMA-LAD, SVG-D1, SVG-marginal), permanent atrial fibrillation, and mitral valve regurgitation.  He was seen and evaluated by Dr. Stanford Breed 2/21.  During that time he was doing well.  He denied dyspnea and chest pain or palpitations.  He was seen in follow-up by  Caron Presume, PA-C on 08/03/2021.  During that time he mentioned he had COVID 3-4 weeks prior.  His primary symptom was fatigue.  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  He reported that at the time of his visit he was only able to work in his shop for 3-4 hours.  He wondered if the symptoms could be cardiac related.  He noted that prior to his CABG he noted dyspnea with exertion.  He denied dyspnea, chest pain, palpitations, lightheadedness, syncope, presyncope, orthopnea and PND.  He denied bleeding.  He discussed calorie restricted diets which she has been successful with previously for weight loss.  Reevaluation in 2 months was recommended.  Recommendation for nuclear stress testing if endurance has not returned.  He presents to the clinic today for follow-up evaluation states he continues to notice a decrease in his endurance since having COVID-19.  He reports that he is able to work for about 6 to 7 hours which but then needs to rest.  After a short rest break he is able to work for another 1 to 2 hours.  He also notices some left shoulder tingling and arm tingling.  This appears to be related to cervical neck radiculopathy.  We reviewed some light range of motion neck activities and range of motion arm activities to try.  I will have him follow-up in 6 to 9 months.  We discussed possibly repeating his echocardiogram if his symptoms of fatigue/endurance worsen.  He expressed understanding.  Today he denies chest pain, shortness of breath, lower extremity edema, fatigue, palpitations, melena, hematuria, hemoptysis, diaphoresis, weakness, presyncope, syncope, orthopnea, and PND.   Home Medications    Prior to Admission medications   Medication Sig Start Date End Date Taking? Authorizing Provider  alfuzosin (UROXATRAL) 10 MG 24 hr tablet Take 10 mg by mouth daily with breakfast.    [provider]  atorvastatin (LIPITOR) 40 MG tablet Take 1  tablet (40 mg total) by mouth in the morning and at bedtime. 06/27/21   Biagio Borg, MD  azelastine (ASTELIN) 0.1 % nasal spray Place 2 sprays into both nostrils 2 (two) times daily. Use in each nostril as directed 04/26/21   Janith Lima, MD  ELIQUIS 5 MG TABS tablet TAKE ONE TABLET BY MOUTH TWICE A DAY 07/21/21   Lelon Perla, MD  metoprolol tartrate (LOPRESSOR) 50 MG tablet TAKE ONE TABLET BY MOUTH TWICE A DAY 07/25/21   Lelon Perla, MD    Family History    Family History  Problem Relation Age of Onset   Coronary artery disease Father    Heart attack Father 18   Cancer Father    Lymphoma Other    Diabetes Sister    Heart attack Sister 56   Ovarian cancer Sister    Atrial fibrillation Brother    Colon cancer Neg Hx    Stomach cancer Neg Hx    He indicated that his mother is deceased. He indicated that his father is deceased. He indicated that the status of his brother is unknown. He indicated that his maternal grandmother is deceased. He indicated that his maternal grandfather is deceased. He indicated that his paternal grandmother is deceased. He indicated that his paternal grandfather is deceased. He indicated that the status of his neg hx is unknown. He indicated that the status of his other is unknown.  Social History    Social History   Socioeconomic History   Marital status: Married    Spouse name: Not on file   Number of children: 4   Years of education: Not on file   Highest education level: Not on file  Occupational History   Occupation: retired   Tobacco Use   Smoking status: Former    Packs/day: 1.00    Years: 25.00    Pack years: 25.00    Types: Cigarettes    Quit date: 03/04/1988    Years since quitting: 33.6   Smokeless tobacco: Never  Vaping Use   Vaping Use: Never used  Substance and Sexual Activity   Alcohol use: Yes    Alcohol/week: 1.0 standard drink    Types: 1 Cans of beer per week    Comment: occ   Drug use: No   Sexual activity:  Not on file  Other Topics Concern   Not on file  Social History Narrative   Regular exercise- yes    Social Determinants of Health   Financial Resource Strain: Low Risk    Difficulty of Paying Living Expenses: Not hard at all  Food Insecurity: No Food Insecurity   Worried About Charity fundraiser in the Last Year: Never true   [place] in the Last Year: Never  true  Transportation Needs: No Transportation Needs   Lack of Transportation (Medical): No   Lack of Transportation (Non-Medical): No  Physical Activity: Sufficiently Active   Days of Exercise per Week: 5 days   Minutes of Exercise per Session: 30 min  Stress: No Stress Concern Present   Feeling of Stress : Not at all  Social Connections: Socially Integrated   Frequency of Communication with Friends and Family: More than three times a week   Frequency of Social Gatherings with Friends and Family: Once a week   Attends Religious Services: More than 4 times per year   Active Member of [employer] or Organizations: Yes   Attends Music therapist: More than 4 times per year   Marital Status: Married  Human resources officer Violence: Not on file     Review of Systems    General:  No chills, fever, night sweats or weight changes.  Cardiovascular:  No chest pain, dyspnea on exertion, edema, orthopnea, palpitations, paroxysmal nocturnal dyspnea. Dermatological: No rash, lesions/masses Respiratory: No cough, dyspnea Urologic: No hematuria, dysuria Abdominal:   No nausea, vomiting, diarrhea, bright red blood per rectum, melena, or hematemesis Neurologic:  No visual changes, wkns, changes in mental status. All other systems reviewed and are otherwise negative except as noted above.  Physical Exam    VS:  There were no vitals taken for this visit. , BMI There is no height or weight on file to calculate BMI. GEN: Well nourished, well developed, in no acute distress. HEENT: normal. Neck: Supple, no JVD, carotid bruits,  or masses. Cardiac: RRR, no murmurs, rubs, or gallops. No clubbing, cyanosis, edema.  Radials/DP/PT 2+ and equal bilaterally.  Respiratory:  Respirations regular and unlabored, clear to auscultation bilaterally. GI: Soft, nontender, nondistended, BS + x 4. MS: no deformity or atrophy. Skin: warm and dry, no rash. Neuro:  Strength and sensation are intact. Psych: Normal affect.  Accessory Clinical Findings    Recent Labs: 04/26/2021: ALT 11; BUN 17; Creatinine, Ser 0.86; Hemoglobin 14.0; Magnesium 2.0; Platelets 235.0; Potassium 4.1; Sodium 139; TSH 0.93   Recent Lipid Panel    Component Value Date/Time   CHOL 100 04/26/2021 1423   TRIG 40.0 04/26/2021 1423   HDL 43.50 04/26/2021 1423   CHOLHDL 2 04/26/2021 1423   VLDL 8.0 04/26/2021 1423   LDLCALC 49 04/26/2021 1423    ECG personally reviewed by me today-none today.  Echocardiogram 12/28/2019 IMPRESSIONS     1. Left ventricular ejection fraction, by estimation, is 55 to 60%. The  left ventricle has normal function. The left ventricle has no regional  wall motion abnormalities. There is mild concentric left ventricular  hypertrophy. Left ventricular diastolic  function could not be evaluated. Left ventricular diastolic function could  not be evaluated.   2. Right ventricular systolic function is normal. The right ventricular  size is normal. There is normal pulmonary artery systolic pressure.   3. Left atrial size was mildly dilated.   4. Right atrial size was mildly dilated.   5. The mitral valve is normal in structure. Mild mitral valve  regurgitation.   6. The aortic valve is tricuspid. Aortic valve regurgitation is trivial.  Mild aortic valve sclerosis is present, with no evidence of aortic valve  stenosis.   7. The inferior vena cava is normal in size with <50% respiratory  variability, suggesting right atrial pressure of 8 mmHg.   Comparison(s): No significant change from prior study.  Assessment & Plan   1.  Fatigue/activity intolerance-reports stable endurance and energy since having COVID-19 infection. No plans for ischemic evaluation at this time Heart healthy low-sodium diet Increase physical activity as tolerated  Coronary artery disease-denies anginal episodes.  Underwent CABG x3 in 2012. Continue atorvastatin, metoprolol Heart healthy low-sodium diet-salty 6 given Increase physical activity as tolerated  Mitral valve regurgitation-no increased DOE or activity intolerance.  Echocardiogram 3/21 showed EF 55-60%, mild dilated left and right atria, mild LVH, mild mitral valve regurgitation. Continue to monitor.  Hyperlipidemia-04/26/2021: Cholesterol 100; HDL 43.50; LDL Cholesterol 49; Triglycerides 40.0; VLDL 8.0 Continue atorvastatin Heart healthy low-sodium high-fiber diet Increase physical activity as tolerated  Left shoulder/arm tingling-has noticed over the past couple weeks.  Notices improvement with changing head position. Neck range of motion and light stretching activities Arm range of motion and light stretching activities Follow-up with PCP if not improved in 1-2 months.  Disposition: Follow-up with Dr. Stanford Breed or me in 6-9 months.   Jossie Ng. Calyn Sivils NP-C    10/02/2021, 12:52 PM [place] [address] Office [phone] Fax [phone]  Notice: This dictation was prepared with Dragon dictation along with smaller phrase technology. Any transcriptional errors that result from this process are unintentional and may not be corrected upon review.  I spent 14 minutes examining this patient, reviewing medications, and using patient centered shared decision making involving her cardiac care.  Prior to her visit I spent greater than 20 minutes reviewing her past medical history,  medications, and prior cardiac tests.

## 2021-10-03 ENCOUNTER — Encounter: Payer: Self-pay | Admitting: General Practice

## 2021-10-03 ENCOUNTER — Other Ambulatory Visit: Payer: Self-pay

## 2021-10-03 ENCOUNTER — Ambulatory Visit: Payer: Medicare Other | Admitting: General Practice

## 2021-10-03 VITALS — BP 102/66 | HR 88 | Ht 67.0 in | Wt 195.0 lb

## 2021-10-03 DIAGNOSIS — R5383 Other fatigue: Secondary | ICD-10-CM

## 2021-10-03 DIAGNOSIS — E782 Mixed hyperlipidemia: Secondary | ICD-10-CM | POA: Diagnosis not present

## 2021-10-03 DIAGNOSIS — I340 Nonrheumatic mitral (valve) insufficiency: Secondary | ICD-10-CM

## 2021-10-03 DIAGNOSIS — I2510 Atherosclerotic heart disease of native coronary artery without angina pectoris: Secondary | ICD-10-CM

## 2021-10-03 DIAGNOSIS — R200 Anesthesia of skin: Secondary | ICD-10-CM

## 2021-10-03 DIAGNOSIS — R202 Paresthesia of skin: Secondary | ICD-10-CM

## 2021-10-03 NOTE — Patient Instructions (Addendum)
Medication Instructions:  The current medical regimen is effective;  continue present plan and medications as directed. Please refer to the Current Medication list given to you today.   *If you need a refill on your cardiac medications before your next appointment, please call your pharmacy*  Lab Work:   Testing/Procedures:  NONE    NONE  Special Instructions DO EXERCISES FROM RIGHT EAR TO SHOULDER, LOOK OVER SHOULDER, LOOK DOWN THEN THE SAME ON THE LEFT SIDE THEN NECK CIRCULAR MOTION. HOLD ALL POSITIONS FOR 15 SECONDS EACH POSITION THREE TIMES DAILY  DO EXERCISES WITH ARM EXTENDED USING ELBOW ROTATION, HOLDING 2ND, 3RD AND THUMB TOGETHER, WHILE MOVING IN CIRCULAR MOTION. THREE TIMES DAILY  PLEASE INCREASE PHYSICAL ACTIVITY AS TOLERATED   IF NO IMPROVEMENT IN 1 MONTH CALL AND DISCUSS WITH PRIMARY MD.  Follow-Up: Your next appointment:  9 month(s) In Person with Kirk Ruths, MD   Please call our office 2 months in advance to schedule this appointment At [facility], you and your health needs are our priority.  As part of our continuing mission to provide you with exceptional heart care, we have created designated Provider Care Teams.  These Care Teams include your primary Cardiologist (physician) and Advanced Practice Providers (APPs -  Physician Assistants and Nurse Practitioners) who all work together to provide you with the care you need, when you need it.

## 2021-10-03 NOTE — Progress Notes (Signed)
All exercises were demonstrated to pt and verbalized understanding

## 2021-10-06 ENCOUNTER — Other Ambulatory Visit: Payer: Self-pay | Admitting: Cardiology

## 2021-10-09 DIAGNOSIS — N3281 Overactive bladder: Secondary | ICD-10-CM | POA: Diagnosis not present

## 2021-10-09 DIAGNOSIS — R972 Elevated prostate specific antigen [PSA]: Secondary | ICD-10-CM | POA: Diagnosis not present

## 2021-10-09 DIAGNOSIS — N401 Enlarged prostate with lower urinary tract symptoms: Secondary | ICD-10-CM | POA: Diagnosis not present

## 2021-10-09 DIAGNOSIS — R3121 Asymptomatic microscopic hematuria: Secondary | ICD-10-CM | POA: Diagnosis not present

## 2021-12-09 ENCOUNTER — Other Ambulatory Visit: Payer: Self-pay | Admitting: Cardiology

## 2021-12-12 ENCOUNTER — Telehealth: Payer: Self-pay | Admitting: Internal Medicine

## 2021-12-12 DIAGNOSIS — L821 Other seborrheic keratosis: Secondary | ICD-10-CM | POA: Diagnosis not present

## 2021-12-12 DIAGNOSIS — L218 Other seborrheic dermatitis: Secondary | ICD-10-CM | POA: Diagnosis not present

## 2021-12-12 DIAGNOSIS — Z85828 Personal history of other malignant neoplasm of skin: Secondary | ICD-10-CM | POA: Diagnosis not present

## 2021-12-12 DIAGNOSIS — D1801 Hemangioma of skin and subcutaneous tissue: Secondary | ICD-10-CM | POA: Diagnosis not present

## 2021-12-12 NOTE — Telephone Encounter (Signed)
LVM for pt to rtn my call to schedule AWV with [facility]. Please schedule AWV if pt calls the office  

## 2021-12-25 ENCOUNTER — Ambulatory Visit: Payer: Medicare Other | Admitting: Internal Medicine

## 2021-12-26 ENCOUNTER — Other Ambulatory Visit: Payer: Self-pay

## 2021-12-26 ENCOUNTER — Encounter: Payer: Self-pay | Admitting: Internal Medicine

## 2021-12-26 ENCOUNTER — Ambulatory Visit (INDEPENDENT_AMBULATORY_CARE_PROVIDER_SITE_OTHER): Payer: Medicare Other | Admitting: Internal Medicine

## 2021-12-26 ENCOUNTER — Ambulatory Visit (INDEPENDENT_AMBULATORY_CARE_PROVIDER_SITE_OTHER): Payer: Medicare Other

## 2021-12-26 VITALS — BP 108/64 | HR 96 | Temp 98.8°F | Ht 67.0 in | Wt 201.0 lb

## 2021-12-26 DIAGNOSIS — Z0001 Encounter for general adult medical examination with abnormal findings: Secondary | ICD-10-CM

## 2021-12-26 DIAGNOSIS — E559 Vitamin D deficiency, unspecified: Secondary | ICD-10-CM | POA: Diagnosis not present

## 2021-12-26 DIAGNOSIS — E785 Hyperlipidemia, unspecified: Secondary | ICD-10-CM | POA: Diagnosis not present

## 2021-12-26 DIAGNOSIS — E538 Deficiency of other specified B group vitamins: Secondary | ICD-10-CM | POA: Diagnosis not present

## 2021-12-26 DIAGNOSIS — D513 Other dietary vitamin B12 deficiency anemia: Secondary | ICD-10-CM | POA: Diagnosis not present

## 2021-12-26 DIAGNOSIS — R972 Elevated prostate specific antigen [PSA]: Secondary | ICD-10-CM

## 2021-12-26 DIAGNOSIS — R5383 Other fatigue: Secondary | ICD-10-CM

## 2021-12-26 DIAGNOSIS — I517 Cardiomegaly: Secondary | ICD-10-CM | POA: Diagnosis not present

## 2021-12-26 DIAGNOSIS — R739 Hyperglycemia, unspecified: Secondary | ICD-10-CM

## 2021-12-26 LAB — HEPATIC FUNCTION PANEL
ALT: 14 U/L (ref 0–53)
AST: 16 U/L (ref 0–37)
Albumin: 4.4 g/dL (ref 3.5–5.2)
Alkaline Phosphatase: 67 U/L (ref 39–117)
Bilirubin, Direct: 0.3 mg/dL (ref 0.0–0.3)
Total Bilirubin: 1.5 mg/dL — ABNORMAL HIGH (ref 0.2–1.2)
Total Protein: 7.2 g/dL (ref 6.0–8.3)

## 2021-12-26 LAB — CBC WITH DIFFERENTIAL/PLATELET
Basophils Absolute: 0.1 10*3/uL (ref 0.0–0.1)
Basophils Relative: 0.9 % (ref 0.0–3.0)
Eosinophils Absolute: 0.2 10*3/uL (ref 0.0–0.7)
Eosinophils Relative: 2 % (ref 0.0–5.0)
HCT: 44.7 % (ref 39.0–52.0)
Hemoglobin: 15.1 g/dL (ref 13.0–17.0)
Lymphocytes Relative: 23.5 % (ref 12.0–46.0)
Lymphs Abs: 2.5 10*3/uL (ref 0.7–4.0)
MCHC: 33.7 g/dL (ref 30.0–36.0)
MCV: 98.3 fl (ref 78.0–100.0)
Monocytes Absolute: 1.3 10*3/uL — ABNORMAL HIGH (ref 0.1–1.0)
Monocytes Relative: 12.1 % — ABNORMAL HIGH (ref 3.0–12.0)
Neutro Abs: 6.7 10*3/uL (ref 1.4–7.7)
Neutrophils Relative %: 61.5 % (ref 43.0–77.0)
Platelets: 217 10*3/uL (ref 150.0–400.0)
RBC: 4.54 Mil/uL (ref 4.22–5.81)
RDW: 13.2 % (ref 11.5–15.5)
WBC: 10.8 10*3/uL — ABNORMAL HIGH (ref 4.0–10.5)

## 2021-12-26 LAB — BASIC METABOLIC PANEL
BUN: 20 mg/dL (ref 6–23)
CO2: 31 mEq/L (ref 19–32)
Calcium: 10 mg/dL (ref 8.4–10.5)
Chloride: 102 mEq/L (ref 96–112)
Creatinine, Ser: 1.1 mg/dL (ref 0.40–1.50)
GFR: 64.69 mL/min (ref >60.00)
Glucose, Bld: 84 mg/dL (ref 70–99)
Potassium: 4.8 mEq/L (ref 3.5–5.1)
Sodium: 139 mEq/L (ref 135–145)

## 2021-12-26 LAB — LIPID PANEL
Cholesterol: 98 mg/dL (ref 0–200)
HDL: 43.8 mg/dL (ref >39.00)
LDL Cholesterol: 39 mg/dL (ref 0–99)
NonHDL: 53.83
Total CHOL/HDL Ratio: 2
Triglycerides: 76 mg/dL (ref 0.0–149.0)
VLDL: 15.2 mg/dL (ref 0.0–40.0)

## 2021-12-26 LAB — CORTISOL: Cortisol, Plasma: 7.9 ug/dL

## 2021-12-26 LAB — BRAIN NATRIURETIC PEPTIDE: Pro B Natriuretic peptide (BNP): 204 pg/mL — ABNORMAL HIGH (ref 0.0–100.0)

## 2021-12-26 LAB — PSA: PSA: 7.41 ng/mL — ABNORMAL HIGH (ref 0.10–4.00)

## 2021-12-26 LAB — HEMOGLOBIN A1C: Hgb A1c MFr Bld: 6 % (ref 4.6–6.5)

## 2021-12-26 LAB — SEDIMENTATION RATE: Sed Rate: 20 mm/hr (ref 0–20)

## 2021-12-26 LAB — VITAMIN D 25 HYDROXY (VIT D DEFICIENCY, FRACTURES): VITD: 14.67 ng/mL — ABNORMAL LOW (ref 30.00–100.00)

## 2021-12-26 LAB — VITAMIN B12: Vitamin B-12: 229 pg/mL (ref 211–911)

## 2021-12-26 LAB — TSH: TSH: 1.13 u[IU]/mL (ref 0.35–5.50)

## 2021-12-26 NOTE — Assessment & Plan Note (Signed)
Lab Results  ?Component Value Date  ? LDLCALC 49 04/26/2021  ? ?Stable, pt to continue current statin lipitor ? ?

## 2021-12-26 NOTE — Assessment & Plan Note (Addendum)
etilogy unclear, also for cortisol, esr, cxr with labs ?

## 2021-12-26 NOTE — Patient Instructions (Signed)
Please continue all other medications as before, and refills have been done if requested.  Please have the pharmacy call with any other refills you may need.  Please continue your efforts at being more active, low cholesterol diet, and weight control.  You are otherwise up to date with prevention measures today.  Please keep your appointments with your specialists as you may have planned  Please go to the XRAY Department in the first floor for the x-ray testing  Please go to the LAB at the blood drawing area for the tests to be done  You will be contacted by phone if any changes need to be made immediately.  Otherwise, you will receive a letter about your results with an explanation, but please check with MyChart first.  Please remember to sign up for MyChart if you have not done so, as this will be important to you in the future with finding out test results, communicating by private email, and scheduling acute appointments online when needed.  Please make an Appointment to return in 6 months, or sooner if needed 

## 2021-12-26 NOTE — Assessment & Plan Note (Signed)
Lab Results  ?Component Value Date  ? [id number] 301 12/09/2019  ? ?Stable, cont oral replacement - b12 1000 mcg qd ? ?

## 2021-12-26 NOTE — Assessment & Plan Note (Signed)
Age and sex appropriate education and counseling updated with regular exercise and diet Referrals for preventative services - none needed Immunizations addressed - declines flu shot Smoking counseling  - none needed Evidence for depression or other mood disorder - none significant Most recent labs reviewed. I have personally reviewed and have noted: 1) the patient's medical and social history 2) The patient's current medications and supplements 3) The patient's height, weight, and BMI have been recorded in the chart  

## 2021-12-26 NOTE — Assessment & Plan Note (Signed)
Lab Results  ?Component Value Date  ? PSA 8.86 (H) 04/26/2021  ? PSA 5.6 12/09/2019  ? PSA 5.3 10/23/2018  ? ? ?For f/u psa today, pt states psa has been 8 - 9 range for many years ?

## 2021-12-26 NOTE — Progress Notes (Signed)
Patient ID: Blake [name], male   DOB: 1945/11/05, 76 y.o.   MRN: [id number] ? ? ? ?     Chief Complaint:: wellness exam and Follow-up (Patient c/o having no energy) ?  , psa elevation, low vit b12 anemia, fatigue, hld ? ?     HPI:  Blake [name] is a 76 y.o. male here for wellness exam; declines flu shot, o/w up to date ?         ?              Also c/o persistent fatigue and exhaustion after 2pm daily post covid infection aug 2022.  Pt denies chest pain, increased sob or doe, wheezing, orthopnea, PND, increased LE swelling, palpitations, dizziness or syncope.   Pt denies polydipsia, polyuria, or new focal neuro s/s.  Pt denies fever, wt loss, night sweats, loss of appetite, or other constitutional symptoms  Denies urinary symptoms such as dysuria, frequency, urgency, flank pain, hematuria or n/v, fever, chills. Denies worsening depressive symptoms, suicidal ideation, or panic; has ongoing anxiety ?  ?Wt Readings from Last 3 Encounters:  ?12/26/21 201 lb (91.2 kg)  ?10/03/21 195 lb (88.5 kg)  ?08/03/21 188 lb 12.8 oz (85.6 kg)  ? ?BP Readings from Last 3 Encounters:  ?12/26/21 108/64  ?10/03/21 102/66  ?08/03/21 118/72  ? ?Immunization History  ?Administered Date(s) Administered  ? Influenza Whole 07/29/2008  ? Influenza, High Dose Seasonal PF 08/07/2014, 09/28/2017, 07/17/2018, 08/17/2019  ? Influenza,inj,Quad PF,6+ Mos 06/28/2016  ? Influenza-Unspecified 07/22/2013, 08/18/2015, 08/22/2016  ? PFIZER(Purple Top)SARS-COV-2 Vaccination 11/12/2019, 12/04/2019, 07/25/2020, 04/26/2021, 08/22/2021  ? Pneumococcal Conjugate-13 02/27/2017  ? Pneumococcal Polysaccharide-23 07/17/2018  ? Tdap 06/07/2020  ? Tetanus 12/11/2008  ? Zoster Recombinat (Shingrix) 06/30/2020, 12/09/2020  ? Zoster, Live 07/22/2013  ?There are no preventive care reminders to display for this patient. ?  ? ?Past Medical History:  ?Diagnosis Date  ? Angina   ? none since CABG  ? Arthritis   ? Bladder spasm   ? takes Levsin as needed  ? CAD (coronary  artery disease)   ? Diverticulosis   ? Duodenal ulcer   ? hx of  ? Dysrhythmia   ? atrial fib post Cabg/ takes Eliquis daily as well as Metoprolol   ? GERD (gastroesophageal reflux disease)   ? takes Protonix daily  ? History of blood transfusion   ? not abnormal reaction  ? History of colon polyps   ? benign  ? History of kidney stones   ? hx of  ? Hyperlipidemia   ? takes Atorvastatin daily  ? HYPERLIPIDEMIA 05/25/2009  ? Joint pain   ? Nocturia   ? PAF (paroxysmal atrial fibrillation) ([place])   ? ?Past Surgical History:  ?Procedure Laterality Date  ? CARDIAC CATHETERIZATION    ? 6/12  ? COLONOSCOPY    ? CORONARY ARTERY BYPASS GRAFT  03/27/2011  ? x3, Dr Tharon Aquas Trigt    ? ESOPHAGOGASTRODUODENOSCOPY ENDOSCOPY  04/12/11  ? cauterization of bleeding ulcer  and artery in duodenum and stomach   ? INGUINAL HERNIA REPAIR  11/06/2011  ? Procedure: HERNIA REPAIR INGUINAL ADULT;  Surgeon: Earnstine Regal, MD;  Location: [facility];  Service: General;  Laterality: N/A;  Repair left inguinal hernia with mesh  ? WISDOM TOOTH EXTRACTION    ? ? reports that he quit smoking about 33 years ago. His smoking use included cigarettes. He has a 25.00 pack-year smoking history. He has never used smokeless tobacco. He reports current alcohol  use of about 1.0 standard drink per week. He reports that he does not use drugs. ?family history includes Atrial fibrillation in his brother; Cancer in his father; Coronary artery disease in his father; Diabetes in his sister; Heart attack (age of onset: 42) in his sister; Heart attack (age of onset: 76) in his father; Lymphoma in an other family member; Ovarian cancer in his sister. ?No Known Allergies ?Current Outpatient Medications on File Prior to Visit  ?Medication Sig Dispense Refill  ? alfuzosin (UROXATRAL) 10 MG 24 hr tablet Take 10 mg by mouth daily with breakfast.    ? atorvastatin (LIPITOR) 40 MG tablet Take 1 tablet (40 mg total) by mouth in the morning and at bedtime. 180 tablet 3  ? azelastine  (ASTELIN) 0.1 % nasal spray Place 2 sprays into both nostrils 2 (two) times daily. Use in each nostril as directed 90 mL 1  ? ELIQUIS 5 MG TABS tablet TAKE ONE TABLET BY MOUTH TWICE A DAY 180 tablet 1  ? metoprolol tartrate (LOPRESSOR) 50 MG tablet TAKE ONE TABLET BY MOUTH TWICE A DAY 60 tablet 6  ? ?No current facility-administered medications on file prior to visit.  ? ?     ROS:  All others reviewed and negative. ? ?Objective  ? ?     PE:  BP 108/64 (BP Location: Right Arm, Patient Position: Sitting, Cuff Size: Large)   Pulse 96   Temp 98.8 ?F (37.1 ?C) (Oral)   Ht $R'5\' 7"'FG$  (1.702 m)   Wt 201 lb (91.2 kg)   SpO2 95%   BMI 31.48 kg/m?  ? ?              Constitutional: Pt appears in NAD ?              HENT: Head: NCAT.  ?              Right Ear: External ear normal.   ?              Left Ear: External ear normal.  ?              Eyes: . Pupils are equal, round, and reactive to light. Conjunctivae and EOM are normal ?              Nose: without d/c or deformity ?              Neck: Neck supple. Gross normal ROM ?              Cardiovascular: Normal rate and regular rhythm.   ?              Pulmonary/Chest: Effort normal and breath sounds without rales or wheezing.  ?              Abd:  Soft, NT, ND, + BS, no organomegaly ?              Neurological: Pt is alert. At baseline orientation, motor grossly intact ?              Skin: Skin is warm. No rashes, no other new lesions, LE edema - none ?              Psychiatric: Pt behavior is normal without agitation  ? ?Micro: none ? ?Cardiac tracings I have personally interpreted today:  none ? ?Pertinent Radiological findings (summarize): none  ? ?Lab Results  ?Component Value Date  ? WBC 10.4 04/26/2021  ?  HGB 14.0 04/26/2021  ? HCT 41.0 04/26/2021  ? PLT 235.0 04/26/2021  ? GLUCOSE 82 04/26/2021  ? CHOL 100 04/26/2021  ? TRIG 40.0 04/26/2021  ? HDL 43.50 04/26/2021  ? LDLCALC 49 04/26/2021  ? ALT 11 04/26/2021  ? AST 14 04/26/2021  ? NA 139 04/26/2021  ? K 4.1  04/26/2021  ? CL 103 04/26/2021  ? CREATININE 0.86 04/26/2021  ? BUN 17 04/26/2021  ? CO2 29 04/26/2021  ? TSH 0.93 04/26/2021  ? PSA 8.86 (H) 04/26/2021  ? INR 1.05 01/29/2017  ? HGBA1C 5.8 11/07/2015  ? ?Assessment/Plan:  ?DANNON NGUYENTHI is a 76 y.o. White or Caucasian [1] male with  has a past medical history of Angina, Arthritis, Bladder spasm, CAD (coronary artery disease), Diverticulosis, Duodenal ulcer, Dysrhythmia, GERD (gastroesophageal reflux disease), History of blood transfusion, History of colon polyps, History of kidney stones, Hyperlipidemia, HYPERLIPIDEMIA (05/25/2009), Joint pain, Nocturia, and PAF (paroxysmal atrial fibrillation) ([place]). ? ?Encounter for well adult exam with abnormal findings ?Age and sex appropriate education and counseling updated with regular exercise and diet ?Referrals for preventative services - none needed ?Immunizations addressed - declines flu shot ?Smoking counseling  - none needed ?Evidence for depression or other mood disorder - none significant ?Most recent labs reviewed. ?I have personally reviewed and have noted: ?1) the patient's medical and social history ?2) The patient's current medications and supplements ?3) The patient's height, weight, and BMI have been recorded in the chart ? ? ?PSA elevation ?Lab Results  ?Component Value Date  ? PSA 8.86 (H) 04/26/2021  ? PSA 5.6 12/09/2019  ? PSA 5.3 10/23/2018  ? ? ?For f/u psa today, pt states psa has been 8 - 9 range for many years ? ?Other dietary vitamin B12 deficiency anemia ?Lab Results  ?Component Value Date  ? [id number] 301 12/09/2019  ? ?Stable, cont oral replacement - b12 1000 mcg qd ? ? ?Fatigue ?etilogy unclear, also for cortisol, esr, cxr with labs ? ?Dyslipidemia, goal LDL below 70 ?Lab Results  ?Component Value Date  ? LDLCALC 49 04/26/2021  ? ?Stable, pt to continue current statin lipitor ? ?Followup: Return in about 6 months (around 06/28/2022). ? ?Cathlean Cower, MD 12/26/2021 8:53 PM ?[facility] ?Internal Medicine ?

## 2021-12-27 LAB — URINALYSIS, ROUTINE W REFLEX MICROSCOPIC
Bilirubin Urine: NEGATIVE
Ketones, ur: NEGATIVE
Leukocytes,Ua: NEGATIVE
Nitrite: NEGATIVE
Specific Gravity, Urine: 1.015 (ref 1.000–1.030)
Total Protein, Urine: NEGATIVE
Urine Glucose: NEGATIVE
Urobilinogen, UA: 0.2 (ref 0.0–1.0)
pH: 7 (ref 5.0–8.0)

## 2022-01-18 ENCOUNTER — Telehealth: Payer: Self-pay | Admitting: Internal Medicine

## 2022-01-18 MED ORDER — ATORVASTATIN CALCIUM 40 MG PO TABS: 40.0000 mg | ORAL_TABLET | Freq: Two times a day (BID) | ORAL | 3 refills | Status: DC

## 2022-01-18 NOTE — Telephone Encounter (Signed)
1.Medication Requested: atorvastatin (LIPITOR) 40 MG tablet ? ?2. Pharmacy (Name, Street, [facility]): [facility] [place] [phone] - [place], [place] ? ?3. On Med List: y  ? ?4. Last Visit with PCP: 12-26-2021 ? ?5. Next visit date with PCP: N/A ? ?Agent: Please be advised that RX refills may take up to 3 business days. We ask that you follow-up with your pharmacy.  ?

## 2022-01-28 ENCOUNTER — Other Ambulatory Visit: Payer: Self-pay | Admitting: Cardiology

## 2022-01-29 NOTE — Telephone Encounter (Signed)
Prescription refill request for Eliquis received. ?Indication:Afib ?Last office visit:12/22 ?Scr:1.1 ?Age: 76 ?Weight:91.2 kg ? ?Prescription refilled ? ?

## 2022-03-08 IMAGING — US US ABDOMEN COMPLETE
2 series · 14 of 25 positions shown · non-contrast
Comparison: None.

CLINICAL DATA: Abdominal bloating, constipation

EXAM:
ABDOMEN ULTRASOUND COMPLETE

[Series 1: us abdomen complete · 13 of 54 slices shown (1 of 2)]
[im 1/54]
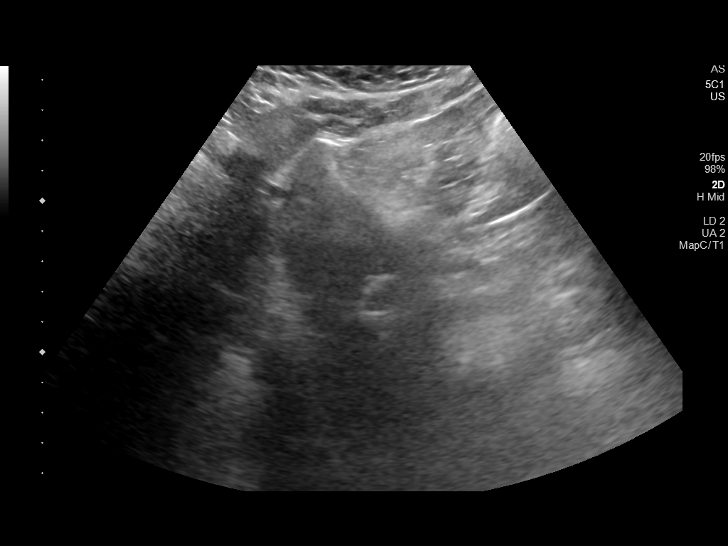
[im 5/54]
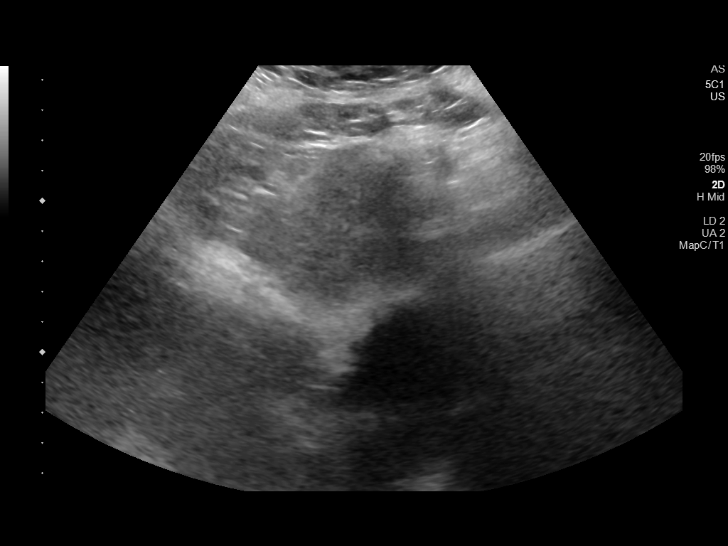
[im 10/54]
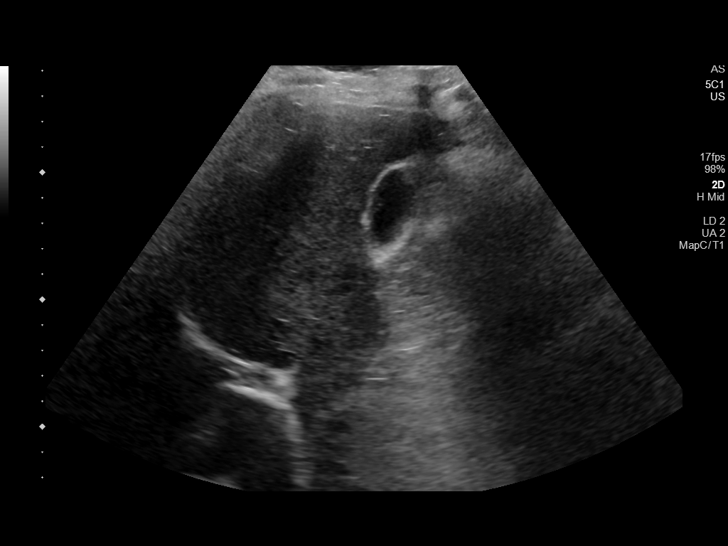
[im 14/54]
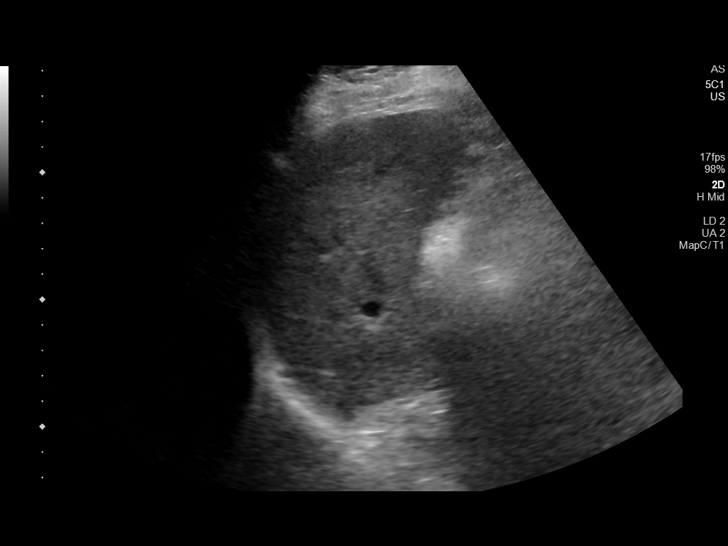
[im 19/54]
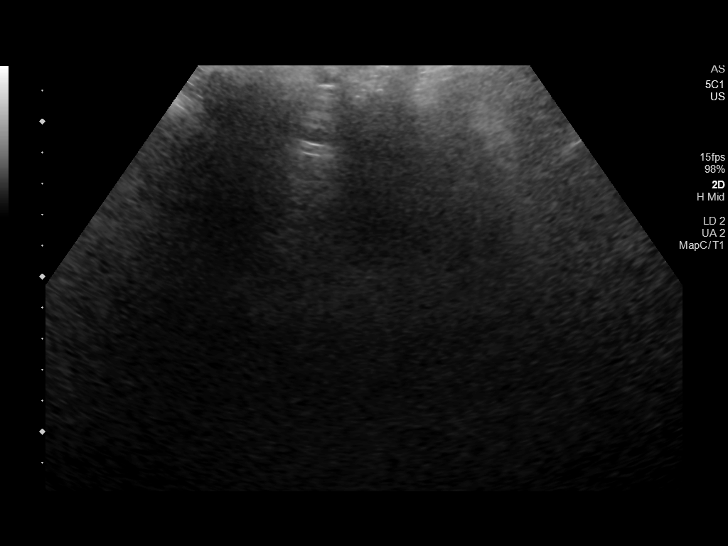
[im 21/54]
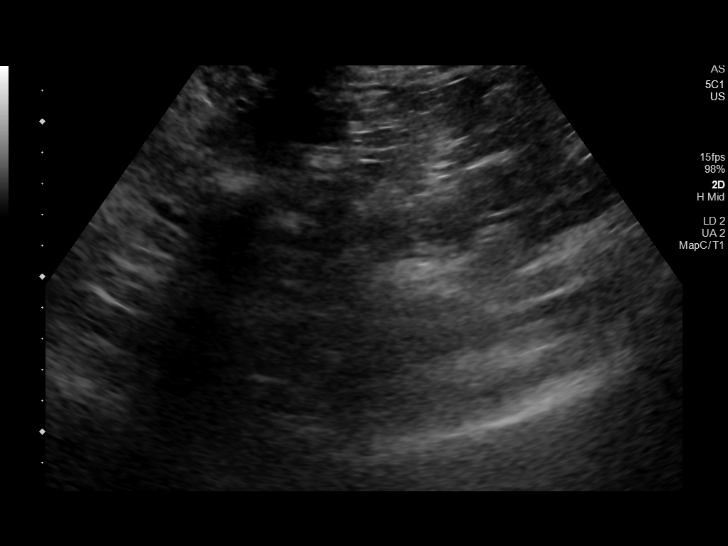
[im 26/54]
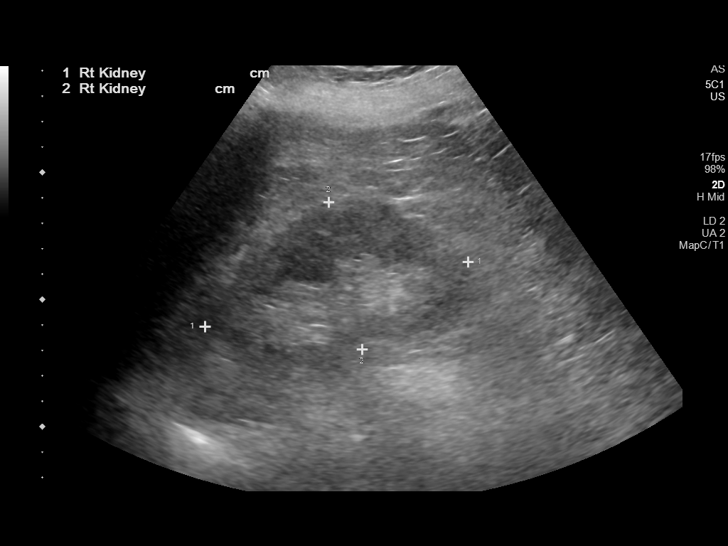
[im 30/54]
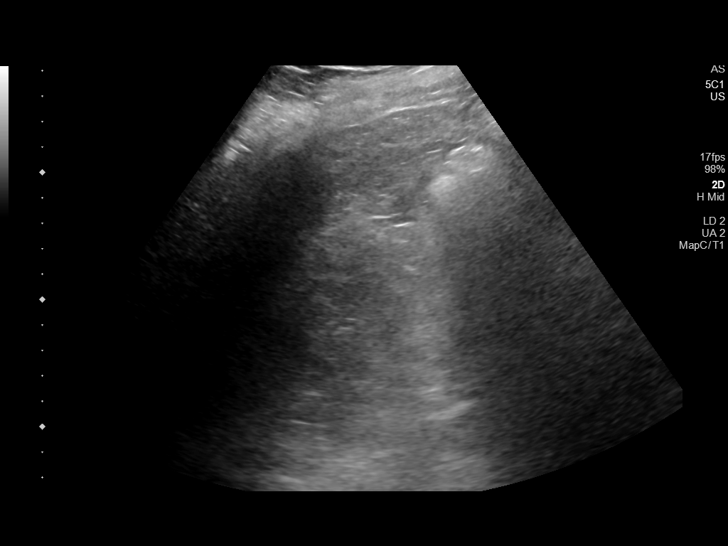
[im 35/54]
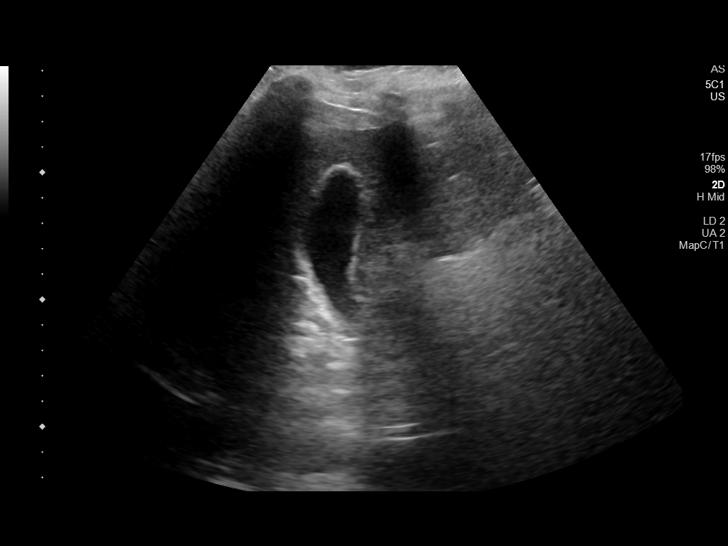
[im 37/54]
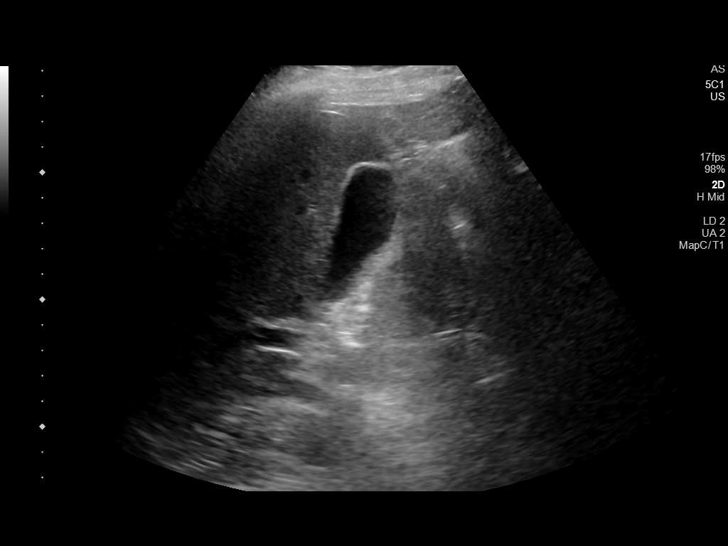
[im 42/54]
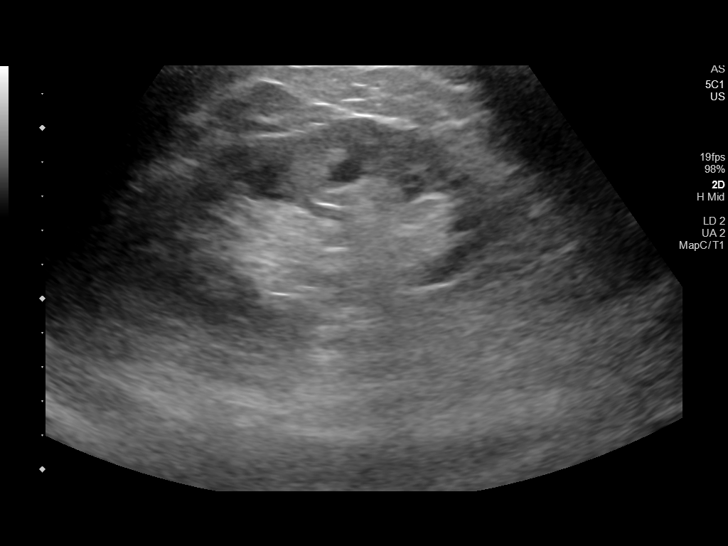
[im 47/54]
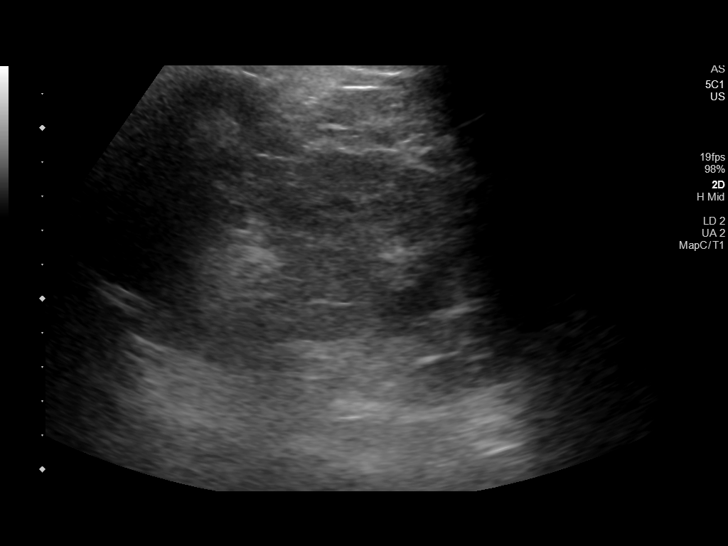
[im 51/54]
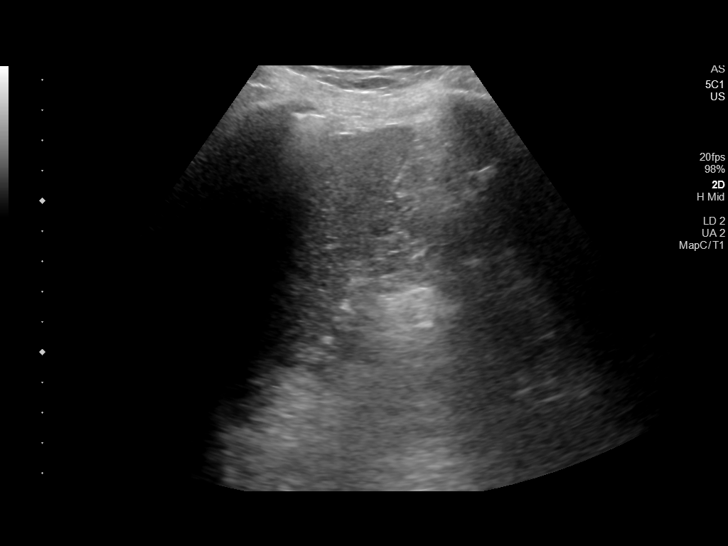

[Series 1001: us abdomen complete · 1 of 1 slices shown (2 of 2)]
[im 1/1]
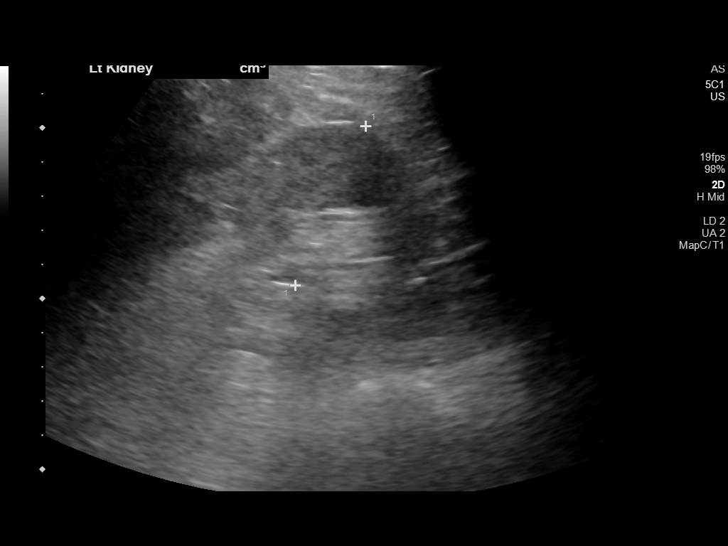

[14 of 25 positions shown; findings below may reference images not displayed]

FINDINGS: Gallbladder: No gallstones or wall thickening visualized. No
sonographic Murphy sign noted by sonographer.

Common bile duct: Diameter: 6 mm

Liver: No focal lesion identified. Within normal limits in
parenchymal echogenicity. Portal vein is patent on color Doppler
imaging with normal direction of blood flow towards the liver.

IVC: Obscured by bowel gas.

Pancreas: Obscured by bowel gas.

Spleen: Poorly visualized, partially obscured by bowel gas.

Right Kidney: Length: 10.7 cm. Echogenicity within normal limits. No
mass or hydronephrosis visualized.

Left Kidney: Length: 11.0 cm. Echogenicity within normal limits. No
mass or hydronephrosis visualized.

Abdominal aorta: Obscured by bowel gas.

Other findings: None.
IMPRESSION: Examination is very limited by bowel gas throughout. Within this
limitation, no ultrasound findings to explain constipation or
bloating.

## 2022-03-12 ENCOUNTER — Telehealth: Payer: Self-pay

## 2022-03-12 NOTE — Progress Notes (Signed)
    Chronic Care Management Pharmacy Assistant   Name: Blake [name]  MRN: [id number] DOB: [birth year]   Reason for Encounter: Disease State-General    Recent office visits:  12/26/21 Biagio Borg, MD-PCP (wellness exam) Orders:Brain natriuretic peptide and CBC with Differential/Platelet, No medication changes  06/27/21 Biagio Borg, MD-PCP (Vasomotor rhinitis) No orders; Medication changes:atorvastatin (LIPITOR) 40 MG tablet  Recent consult visits:  10/03/21 Deberah Pelton, NP-Cardiology (Fatigue) No orders or medication changes  Hospital visits:  None in previous 6 months  Medications: Outpatient Encounter Medications as of 03/12/2022  Medication Sig   alfuzosin (UROXATRAL) 10 MG 24 hr tablet Take 10 mg by mouth daily with breakfast.   atorvastatin (LIPITOR) 40 MG tablet Take 1 tablet (40 mg total) by mouth in the morning and at bedtime.   azelastine (ASTELIN) 0.1 % nasal spray Place 2 sprays into both nostrils 2 (two) times daily. Use in each nostril as directed   ELIQUIS 5 MG TABS tablet TAKE ONE TABLET BY MOUTH TWICE A DAY   metoprolol tartrate (LOPRESSOR) 50 MG tablet TAKE ONE TABLET BY MOUTH TWICE A DAY   No facility-administered encounter medications on file as of 03/12/2022.   3 attempts to contacted [name] for General Review Call. Unable to reach patient,left messages to return call.   Chart Review: Have there been any documented new, changed, or discontinued medications since last visit?No (If yes, include name, dose, frequency, date) None in the last six months Has there been any documented recent hospitalizations or ED visits since last visit with Clinical Pharmacist? No,None in the last six months Brief Summary (including medication and/or Diagnosis changes):   Adherence Review: Does the Clinical Pharmacist Assistant have access to adherence rates? Yes Adherence rates for STAR metric medications (List medication(s)/day supply/ last 2 fill  dates). Adherence rates for medications indicated for disease state being reviewed (List medication(s)/day supply/ last 2 fill dates). Does the patient have >5 day gap between last estimated fill dates for any of the above medications or other medication gaps? No Reason for medication gaps.  Next visit Type: telephone       Visit with:Clinical Pharmacist        Date:05/21/22        Time:1:45pm     Care Gaps: Colonoscopy-11/19/18 Diabetic Foot Exam-NA Ophthalmology-NA Dexa Scan - NA Annual Well Visit - 12/26/21 Micro albumin-NA Hemoglobin A1c- 12/26/21  Star Rating Drugs: Atorvastatin 40 mg-last fill 01/18/22 90 ds   Ethelene Hal Clinical Pharmacist Assistant [phone]

## 2022-03-22 ENCOUNTER — Telehealth: Payer: Self-pay | Admitting: Internal Medicine

## 2022-03-22 NOTE — Telephone Encounter (Signed)
LVM for pt to rtn my call to schedule AWV with [facility] call back # [phone] 

## 2022-04-02 DIAGNOSIS — N401 Enlarged prostate with lower urinary tract symptoms: Secondary | ICD-10-CM | POA: Diagnosis not present

## 2022-04-02 DIAGNOSIS — N3281 Overactive bladder: Secondary | ICD-10-CM | POA: Diagnosis not present

## 2022-04-02 DIAGNOSIS — R3915 Urgency of urination: Secondary | ICD-10-CM | POA: Diagnosis not present

## 2022-04-02 DIAGNOSIS — R350 Frequency of micturition: Secondary | ICD-10-CM | POA: Diagnosis not present

## 2022-05-21 ENCOUNTER — Telehealth: Payer: Medicare Other

## 2022-07-03 DIAGNOSIS — K080 Exfoliation of teeth due to systemic causes: Secondary | ICD-10-CM | POA: Diagnosis not present

## 2022-07-08 ENCOUNTER — Other Ambulatory Visit: Payer: Self-pay | Admitting: Cardiology

## 2022-07-16 ENCOUNTER — Telehealth: Payer: Self-pay | Admitting: Internal Medicine

## 2022-07-16 DIAGNOSIS — H52203 Unspecified astigmatism, bilateral: Secondary | ICD-10-CM | POA: Diagnosis not present

## 2022-07-16 NOTE — Telephone Encounter (Signed)
LVM for pt to rtn my call to schedule AWV with [facility] call back # [phone] 

## 2022-07-26 ENCOUNTER — Telehealth: Payer: Self-pay

## 2022-07-26 NOTE — Telephone Encounter (Signed)
LVM asking patient if interested in scheduling AWV.

## 2022-08-06 ENCOUNTER — Other Ambulatory Visit: Payer: Self-pay | Admitting: Cardiology

## 2022-08-15 ENCOUNTER — Other Ambulatory Visit: Payer: Self-pay | Admitting: Cardiology

## 2022-08-15 NOTE — Telephone Encounter (Signed)
Prescription refill request for Eliquis received. Indication: AF Last office visit: 10/03/21  Thomasene Mohair NP  (has appt 08/20/22) Scr: 1.10 on 12/26/21 Age: 76 Weight: 88.5kg  Based on above findings Eliquis '5mg'$  twice daily is the appropriate dose.  Refill approved.

## 2022-08-20 ENCOUNTER — Encounter: Payer: Self-pay | Admitting: Physician Assistant

## 2022-08-20 ENCOUNTER — Ambulatory Visit: Payer: Medicare Other | Attending: Physician Assistant | Admitting: Physician Assistant

## 2022-08-20 VITALS — BP 96/70 | HR 102 | Ht 67.0 in | Wt 200.0 lb

## 2022-08-20 DIAGNOSIS — I482 Chronic atrial fibrillation, unspecified: Secondary | ICD-10-CM

## 2022-08-20 DIAGNOSIS — R5383 Other fatigue: Secondary | ICD-10-CM | POA: Diagnosis not present

## 2022-08-20 DIAGNOSIS — I25810 Atherosclerosis of coronary artery bypass graft(s) without angina pectoris: Secondary | ICD-10-CM

## 2022-08-20 DIAGNOSIS — I340 Nonrheumatic mitral (valve) insufficiency: Secondary | ICD-10-CM

## 2022-08-20 MED ORDER — METOPROLOL TARTRATE 50 MG PO TABS: 50.0000 mg | ORAL_TABLET | Freq: Two times a day (BID) | ORAL | 1 refills | Status: DC

## 2022-08-20 NOTE — Patient Instructions (Addendum)
Medication Instructions:  Your physician recommends that you continue on your current medications as directed. Please refer to the Current Medication list given to you today.  *If you need a refill on your cardiac medications before your next appointment, please call your pharmacy*  Lab Work: NONE ordered at this time of appointment   If you have labs (blood work) drawn today and your tests are completely normal, you will receive your results only by: [place] (if you have MyChart) OR A paper copy in the mail If you have any lab test that is abnormal or we need to change your treatment, we will call you to review the results.  Testing/Procedures: NONE ordered at this time of appointment   Follow-Up: At [facility], you and your health needs are our priority.  As part of our continuing mission to provide you with exceptional heart care, we have created designated Provider Care Teams.  These Care Teams include your primary Cardiologist (physician) and Advanced Practice Providers (APPs -  Physician Assistants and Nurse Practitioners) who all work together to provide you with the care you need, when you need it.  Your next appointment:   6 month(s)  The format for your next appointment:   In Person  Provider:   Kirk Ruths, MD     Other Instructions  Important Information About Sugar

## 2022-08-20 NOTE — Progress Notes (Signed)
Cardiology Office Note:    Date:  08/22/2022   ID:  Blake, [name] 04/14/1946, MRN [id number]  PCP:  Biagio Borg, MD   [facility] Providers Cardiologist:  Kirk Ruths, MD     Referring MD: Biagio Borg, MD   Chief Complaint  Patient presents with   Follow-up    12 months.    History of Present Illness:    Blake [name] is a 76 y.o. male with a hx of CAD s/p CAG 2012 (LIMA-LAD, SVG-D1, SVG-Marginal), permanent atrial fibrillation and mitral valve regurgitation.  Abdominal CT in 2012 showed no aneurysm.  Heart monitor in October 2014 showed paroxysmal atrial fibrillation.  Myoview in October 2014 showed EF 63%, normal perfusion.  Echocardiogram in April 2018 showed normal EF, moderate to severe MR, moderate LAE.  Repeat echocardiogram in May 2019 showed normal EF, mild to moderate MR.  Last echo obtained in March 2021 showed EF 55 to 60%, mild LVH, mild biatrial enlargement, mild MR, trivial AI.  Patient had COVID-19 infection around September 2022.  He was last seen by Coletta Memos, NP on 10/03/2021 at which time his fatigue has not changed.  Patient presents today for follow-up.  His primary complaint is still fatigue which has not changed in the past 3 to 4 years.  He denies any recent chest pain or worsening shortness of breath.  On physical exam, he does not have any noticeable heart murmur.  Blood pressure initially was low at 96/70, repeat blood pressure was 100/56.  I recommended continue on the current therapy.  Blood work obtained in March 2023 showed normal red blood cell count, hemoglobin, electrolyte, and cholesterol.  Stable renal function.  Given lack of significant change, I did not repeat echocardiogram at this time.  His heart rate is still very well controlled on metoprolol tartrate.  He can follow-up with Dr. [facility] Breed in 6 months.    Past Medical History:  Diagnosis Date   Angina    none since CABG   Arthritis    Bladder spasm    takes  Levsin as needed   CAD (coronary artery disease)    Diverticulosis    Duodenal ulcer    hx of   Dysrhythmia    atrial fib post Cabg/ takes Eliquis daily as well as Metoprolol    GERD (gastroesophageal reflux disease)    takes Protonix daily   History of blood transfusion    not abnormal reaction   History of colon polyps    benign   History of kidney stones    hx of   Hyperlipidemia    takes Atorvastatin daily   HYPERLIPIDEMIA 05/25/2009   Joint pain    Nocturia    PAF (paroxysmal atrial fibrillation) ([place])     Past Surgical History:  Procedure Laterality Date   CARDIAC CATHETERIZATION     6/12   COLONOSCOPY     CORONARY ARTERY BYPASS GRAFT  03/27/2011   x3, Dr Tharon Aquas Trigt     ESOPHAGOGASTRODUODENOSCOPY ENDOSCOPY  04/12/11   cauterization of bleeding ulcer  and artery in duodenum and stomach    INGUINAL HERNIA REPAIR  11/06/2011   Procedure: HERNIA REPAIR INGUINAL ADULT;  Surgeon: Earnstine Regal, MD;  Location: [facility];  Service: General;  Laterality: N/A;  Repair left inguinal hernia with mesh   WISDOM TOOTH EXTRACTION      Current Medications: Current Meds  Medication Sig   alfuzosin (UROXATRAL) 10 MG 24 hr  tablet Take 10 mg by mouth daily with breakfast.   atorvastatin (LIPITOR) 40 MG tablet Take 1 tablet (40 mg total) by mouth in the morning and at bedtime.   azelastine (ASTELIN) 0.1 % nasal spray Place 2 sprays into both nostrils 2 (two) times daily. Use in each nostril as directed   ELIQUIS 5 MG TABS tablet TAKE ONE TABLET BY MOUTH TWICE A DAY   [DISCONTINUED] metoprolol tartrate (LOPRESSOR) 50 MG tablet Take 1 tablet (50 mg total) by mouth 2 (two) times daily. Patient must schedule appointment for future refills first attempt     Allergies:   Patient has no known allergies.   Social History   Socioeconomic History   Marital status: Married    Spouse name: Not on file   Number of children: 4   Years of education: Not on file   Highest education level: Not  on file  Occupational History   Occupation: retired   Tobacco Use   Smoking status: Former    Packs/day: 1.00    Years: 25.00    Total pack years: 25.00    Types: Cigarettes    Quit date: 03/04/1988    Years since quitting: 34.4   Smokeless tobacco: Never  Vaping Use   Vaping Use: Never used  Substance and Sexual Activity   Alcohol use: Yes    Alcohol/week: 1.0 standard drink of alcohol    Types: 1 Cans of beer per week    Comment: occ   Drug use: No   Sexual activity: Not on file  Other Topics Concern   Not on file  Social History Narrative   Regular exercise- yes    Social Determinants of Health   Financial Resource Strain: Low Risk  (12/14/2020)   Overall Financial Resource Strain (CARDIA)    Difficulty of Paying Living Expenses: Not hard at all  Food Insecurity: No Food Insecurity (12/14/2020)   Hunger Vital Sign    Worried About Running Out of Food in the Last Year: Never true    Ran Out of Food in the Last Year: Never true  Transportation Needs: No Transportation Needs (12/14/2020)   PRAPARE - Hydrologist (Medical): No    Lack of Transportation (Non-Medical): No  Physical Activity: Sufficiently Active (12/14/2020)   Exercise Vital Sign    Days of Exercise per Week: 5 days    Minutes of Exercise per Session: 30 min  Stress: No Stress Concern Present (12/14/2020)   [place]    Feeling of Stress : Not at all  Social Connections: [place] (12/14/2020)   Social Connection and Isolation Panel [NHANES]    Frequency of Communication with Friends and Family: More than three times a week    Frequency of Social Gatherings with Friends and Family: Once a week    Attends Religious Services: More than 4 times per year    Active Member of [employer] or Organizations: Yes    Attends Music therapist: More than 4 times per year    Marital Status: Married     Family  History: The patient's family history includes Atrial fibrillation in his brother; Cancer in his father; Coronary artery disease in his father; Diabetes in his sister; Heart attack (age of onset: 15) in his sister; Heart attack (age of onset: 45) in his father; Lymphoma in an other family member; Ovarian cancer in his sister. There is no history of Colon cancer or  Stomach cancer.  ROS:   Please see the history of present illness.     All other systems reviewed and are negative.  EKGs/Labs/Other Studies Reviewed:    The following studies were reviewed today:  Echo 12/28/2019  1. Left ventricular ejection fraction, by estimation, is 55 to 60%. The  left ventricle has normal function. The left ventricle has no regional  wall motion abnormalities. There is mild concentric left ventricular  hypertrophy. Left ventricular diastolic  function could not be evaluated. Left ventricular diastolic function could  not be evaluated.   2. Right ventricular systolic function is normal. The right ventricular  size is normal. There is normal pulmonary artery systolic pressure.   3. Left atrial size was mildly dilated.   4. Right atrial size was mildly dilated.   5. The mitral valve is normal in structure. Mild mitral valve  regurgitation.   6. The aortic valve is tricuspid. Aortic valve regurgitation is trivial.  Mild aortic valve sclerosis is present, with no evidence of aortic valve  stenosis.   7. The inferior vena cava is normal in size with <50% respiratory  variability, suggesting right atrial pressure of 8 mmHg.   Comparison(s): No significant change from prior study.   EKG:  EKG is ordered today.  The ekg ordered today demonstrates Atrial fibrillation, heart rate 102  Recent Labs: 12/26/2021: ALT 14; BUN 20; Creatinine, Ser 1.10; Hemoglobin 15.1; Platelets 217.0; Potassium 4.8; Pro B Natriuretic peptide (BNP) 204.0; Sodium 139; TSH 1.13  Recent Lipid Panel    Component Value Date/Time   CHOL  98 12/26/2021 1616   TRIG 76.0 12/26/2021 1616   HDL 43.80 12/26/2021 1616   CHOLHDL 2 12/26/2021 1616   VLDL 15.2 12/26/2021 1616   LDLCALC 39 12/26/2021 1616     Risk Assessment/Calculations:    CHA2DS2-VASc Score = 3   This indicates a 3.2% annual risk of stroke. The patient's score is based upon: CHF History: 0 HTN History: 0 Diabetes History: 0 Stroke History: 0 Vascular Disease History: 1 Age Score: 2 Gender Score: 0          Physical Exam:    VS:  BP 96/70 (BP Location: Right Arm, Patient Position: Sitting, Cuff Size: Normal)   Pulse (!) 102   Ht '5\' 7"'$  (1.702 m)   Wt 200 lb (90.7 kg)   BMI 31.32 kg/m        Wt Readings from Last 3 Encounters:  08/20/22 200 lb (90.7 kg)  12/26/21 201 lb (91.2 kg)  10/03/21 195 lb (88.5 kg)     GEN:  Well nourished, well developed in no acute distress HEENT: Normal NECK: No JVD; No carotid bruits LYMPHATICS: No lymphadenopathy CARDIAC: Irregularly irregular, no murmurs, rubs, gallops RESPIRATORY:  Clear to auscultation without rales, wheezing or rhonchi  ABDOMEN: Soft, non-tender, non-distended MUSCULOSKELETAL:  No edema; No deformity  SKIN: Warm and dry NEUROLOGIC:  Alert and oriented x 3 PSYCHIATRIC:  Normal affect   ASSESSMENT:    1. Chronic atrial fibrillation ([place])   2. Coronary artery disease involving coronary bypass graft of native heart without angina pectoris   3. Non-rheumatic mitral regurgitation   4. Fatigue, unspecified type    PLAN:    In order of problems listed above:  Chronic atrial fibrillation: Rate controlled on current therapy.  Patient's heart rate was elevated on arrival, however quickly settled down.  Unable to increase metoprolol due to borderline blood pressure  CAD: Denies any recent chest pain.  Is not  on aspirin given the need for Eliquis  Mitral valve regurgitation: Mild MR noted on previous echocardiogram in March 2021.  Given lack of significant change in the symptom, I did  not repeat echocardiogram today.  Fatigue: Patient has chronic fatigue that has been going on for 3 to 4 years, this has not changed.           Medication Adjustments/Labs and Tests Ordered: Current medicines are reviewed at length with the patient today.  Concerns regarding medicines are outlined above.  Orders Placed This Encounter  Procedures   EKG 12-Lead   Meds ordered this encounter  Medications   metoprolol tartrate (LOPRESSOR) 50 MG tablet    Sig: Take 1 tablet (50 mg total) by mouth 2 (two) times daily.    Dispense:  180 tablet    Refill:  1    Patient Instructions  Medication Instructions:  Your physician recommends that you continue on your current medications as directed. Please refer to the Current Medication list given to you today.  *If you need a refill on your cardiac medications before your next appointment, please call your pharmacy*  Lab Work: NONE ordered at this time of appointment   If you have labs (blood work) drawn today and your tests are completely normal, you will receive your results only by: [place] (if you have MyChart) OR A paper copy in the mail If you have any lab test that is abnormal or we need to change your treatment, we will call you to review the results.  Testing/Procedures: NONE ordered at this time of appointment   Follow-Up: At [facility], you and your health needs are our priority.  As part of our continuing mission to provide you with exceptional heart care, we have created designated Provider Care Teams.  These Care Teams include your primary Cardiologist (physician) and Advanced Practice Providers (APPs -  Physician Assistants and Nurse Practitioners) who all work together to provide you with the care you need, when you need it.  Your next appointment:   6 month(s)  The format for your next appointment:   In Person  Provider:   Kirk Ruths, MD     Other Instructions  Important Information  About Sugar         Hilbert Corrigan, [place]  08/22/2022 10:17 PM    [place]

## 2022-08-21 ENCOUNTER — Telehealth: Payer: Self-pay | Admitting: Internal Medicine

## 2022-08-21 NOTE — Telephone Encounter (Signed)
Left message for patient to call back to schedule Medicare Annual Wellness Visit   Last AWV  12/14/20  Please schedule at anytime with [facility] if patient calls the office back.   Any questions, please call me at [phone]

## 2022-08-29 ENCOUNTER — Telehealth: Payer: Self-pay | Admitting: Internal Medicine

## 2022-08-29 NOTE — Telephone Encounter (Signed)
LVM for pt to rtn my call to schedule AWV with [facility] call back # [phone] 

## 2022-10-29 ENCOUNTER — Telehealth: Payer: Self-pay

## 2022-10-29 NOTE — Telephone Encounter (Signed)
Pt PA is approved for Atorvastatin  (Key: [id number])  10/24/2022 through 10/25/2023.

## 2022-12-27 DIAGNOSIS — L821 Other seborrheic keratosis: Secondary | ICD-10-CM | POA: Diagnosis not present

## 2022-12-27 DIAGNOSIS — Z85828 Personal history of other malignant neoplasm of skin: Secondary | ICD-10-CM | POA: Diagnosis not present

## 2022-12-27 DIAGNOSIS — L218 Other seborrheic dermatitis: Secondary | ICD-10-CM | POA: Diagnosis not present

## 2022-12-27 DIAGNOSIS — D1801 Hemangioma of skin and subcutaneous tissue: Secondary | ICD-10-CM | POA: Diagnosis not present

## 2022-12-27 DIAGNOSIS — L570 Actinic keratosis: Secondary | ICD-10-CM | POA: Diagnosis not present

## 2023-01-07 DIAGNOSIS — K080 Exfoliation of teeth due to systemic causes: Secondary | ICD-10-CM | POA: Diagnosis not present

## 2023-01-25 ENCOUNTER — Other Ambulatory Visit: Payer: Self-pay | Admitting: Cardiology

## 2023-01-25 ENCOUNTER — Other Ambulatory Visit: Payer: Self-pay | Admitting: Internal Medicine

## 2023-01-25 DIAGNOSIS — I482 Chronic atrial fibrillation, unspecified: Secondary | ICD-10-CM

## 2023-01-25 NOTE — Telephone Encounter (Signed)
Prescription refill request for Eliquis received. Indication: a fib Last office visit:  08/20/22 Scr: 1.1 12/26/21 overdue Age: 77 Weight: 90kg

## 2023-01-29 ENCOUNTER — Other Ambulatory Visit: Payer: Self-pay

## 2023-01-29 DIAGNOSIS — I482 Chronic atrial fibrillation, unspecified: Secondary | ICD-10-CM | POA: Diagnosis not present

## 2023-01-30 LAB — BASIC METABOLIC PANEL
BUN/Creatinine Ratio: 13 (ref 10–24)
BUN: 13 mg/dL (ref 8–27)
CO2: 25 mmol/L (ref 20–29)
Calcium: 9.8 mg/dL (ref 8.6–10.2)
Chloride: 102 mmol/L (ref 96–106)
Creatinine, Ser: 1.02 mg/dL (ref 0.76–1.27)
Glucose: 80 mg/dL (ref 70–99)
Potassium: 4.6 mmol/L (ref 3.5–5.2)
Sodium: 141 mmol/L (ref 134–144)
eGFR: 75 mL/min/{1.73_m2} (ref >59)

## 2023-02-24 ENCOUNTER — Other Ambulatory Visit: Payer: Self-pay | Admitting: Internal Medicine

## 2023-02-25 ENCOUNTER — Other Ambulatory Visit: Payer: Self-pay

## 2023-02-25 DIAGNOSIS — I482 Chronic atrial fibrillation, unspecified: Secondary | ICD-10-CM

## 2023-02-25 MED ORDER — APIXABAN 5 MG PO TABS
5.0000 mg | ORAL_TABLET | Freq: Two times a day (BID) | ORAL | 5 refills | Status: DC
Start: 2023-02-25 — End: 2023-03-11

## 2023-02-25 NOTE — Telephone Encounter (Signed)
Prescription refill request for Eliquis received. Indication: Afib  Last office visit: 08/20/22 Lisabeth Devoid)  Scr: 1.02 (01/29/23)  Age: 77 Weight: 90.7kg  Appropriate dose. Refill sent.

## 2023-02-27 ENCOUNTER — Telehealth: Payer: Self-pay | Admitting: Internal Medicine

## 2023-02-27 NOTE — Telephone Encounter (Signed)
Called patient to schedule Medicare Annual Wellness Visit (AWV). Left message for patient to call back and schedule Medicare Annual Wellness Visit (AWV).  Last date of AWV: 12/14/2020  Please schedule an appointment at any time with [facility].  If any questions, please contact me at [phone].  Thank you ,  Randon Goldsmith Care Guide [facility] AWV TEAM Direct Dial: [phone]

## 2023-03-11 ENCOUNTER — Other Ambulatory Visit: Payer: Self-pay

## 2023-03-11 DIAGNOSIS — I482 Chronic atrial fibrillation, unspecified: Secondary | ICD-10-CM

## 2023-03-11 MED ORDER — APIXABAN 5 MG PO TABS
5.0000 mg | ORAL_TABLET | Freq: Two times a day (BID) | ORAL | 1 refills | Status: DC
Start: 2023-03-11 — End: 2024-03-02

## 2023-03-17 ENCOUNTER — Other Ambulatory Visit: Payer: Self-pay | Admitting: Physician Assistant

## 2023-03-27 ENCOUNTER — Other Ambulatory Visit: Payer: Self-pay

## 2023-03-27 ENCOUNTER — Other Ambulatory Visit: Payer: Self-pay | Admitting: Internal Medicine

## 2023-04-03 DIAGNOSIS — R3914 Feeling of incomplete bladder emptying: Secondary | ICD-10-CM | POA: Diagnosis not present

## 2023-04-03 DIAGNOSIS — N401 Enlarged prostate with lower urinary tract symptoms: Secondary | ICD-10-CM | POA: Diagnosis not present

## 2023-04-03 DIAGNOSIS — N3281 Overactive bladder: Secondary | ICD-10-CM | POA: Diagnosis not present

## 2023-04-03 DIAGNOSIS — R3915 Urgency of urination: Secondary | ICD-10-CM | POA: Diagnosis not present

## 2023-04-25 ENCOUNTER — Other Ambulatory Visit: Payer: Self-pay | Admitting: Internal Medicine

## 2023-05-06 ENCOUNTER — Other Ambulatory Visit: Payer: Self-pay | Admitting: Internal Medicine

## 2023-05-07 ENCOUNTER — Other Ambulatory Visit: Payer: Self-pay | Admitting: Internal Medicine

## 2023-05-07 ENCOUNTER — Telehealth: Payer: Self-pay | Admitting: Radiology

## 2023-05-07 NOTE — Telephone Encounter (Signed)
A user error has taken place: encounter opened in error, closed for administrative reasons.

## 2023-05-07 NOTE — Telephone Encounter (Signed)
Left voice mail for patient to call back at [phone] to schedule Medicare Annual Wellness Visit    Last AWV:  12/14/20   Please schedule Sequential/Initial AWV with [facility] K. CMA

## 2023-05-09 ENCOUNTER — Telehealth: Payer: Self-pay | Admitting: Internal Medicine

## 2023-05-09 MED ORDER — ATORVASTATIN CALCIUM 40 MG PO TABS: ORAL_TABLET | ORAL | 0 refills | Status: DC

## 2023-05-09 NOTE — Telephone Encounter (Signed)
Prescription Request  05/09/2023  LOV: Visit date not found  What is the name of the medication or equipment?  atorvastatin (LIPITOR) 40 MG tablet [   Have you contacted your pharmacy to request a refill? No   Which pharmacy would you like this sent to?  [facility] [id number] [place], [place] - [address] [facility] [address] [place] [address] Phone: [phone] Fax: [phone]    Patient notified that their request is being sent to the clinical staff for review and that they should receive a response within 2 business days.   Please advise at Mobile [phone] (mobile)

## 2023-05-09 NOTE — Telephone Encounter (Signed)
Refill was denied due to pt needing OV. Last saw MD 12/26/2021. Need appt for approval../lmb

## 2023-05-09 NOTE — Telephone Encounter (Signed)
Sent 30 day to pharmacy until appt.Marland KitchenSantiago [name]

## 2023-05-09 NOTE — Telephone Encounter (Signed)
Pt has a up coming appt.

## 2023-05-16 ENCOUNTER — Ambulatory Visit (INDEPENDENT_AMBULATORY_CARE_PROVIDER_SITE_OTHER): Payer: Medicare Other | Admitting: Internal Medicine

## 2023-05-16 VITALS — BP 108/64 | HR 92 | Temp 98.4°F | Ht 67.0 in | Wt 164.0 lb

## 2023-05-16 DIAGNOSIS — E559 Vitamin D deficiency, unspecified: Secondary | ICD-10-CM

## 2023-05-16 DIAGNOSIS — I5032 Chronic diastolic (congestive) heart failure: Secondary | ICD-10-CM | POA: Diagnosis not present

## 2023-05-16 DIAGNOSIS — E785 Hyperlipidemia, unspecified: Secondary | ICD-10-CM | POA: Diagnosis not present

## 2023-05-16 DIAGNOSIS — G4762 Sleep related leg cramps: Secondary | ICD-10-CM

## 2023-05-16 DIAGNOSIS — R739 Hyperglycemia, unspecified: Secondary | ICD-10-CM | POA: Diagnosis not present

## 2023-05-16 DIAGNOSIS — R634 Abnormal weight loss: Secondary | ICD-10-CM

## 2023-05-16 DIAGNOSIS — Z0001 Encounter for general adult medical examination with abnormal findings: Secondary | ICD-10-CM

## 2023-05-16 DIAGNOSIS — E538 Deficiency of other specified B group vitamins: Secondary | ICD-10-CM

## 2023-05-16 DIAGNOSIS — R972 Elevated prostate specific antigen [PSA]: Secondary | ICD-10-CM

## 2023-05-16 LAB — CBC WITH DIFFERENTIAL/PLATELET
Basophils Absolute: 0.1 10*3/uL (ref 0.0–0.1)
Basophils Relative: 1.8 % (ref 0.0–3.0)
Eosinophils Absolute: 0.1 10*3/uL (ref 0.0–0.7)
Eosinophils Relative: 1.4 % (ref 0.0–5.0)
HCT: 43.6 % (ref 39.0–52.0)
Hemoglobin: 14.3 g/dL (ref 13.0–17.0)
Lymphocytes Relative: 19.3 % (ref 12.0–46.0)
Lymphs Abs: 1.5 10*3/uL (ref 0.7–4.0)
MCHC: 32.7 g/dL (ref 30.0–36.0)
MCV: 101.7 fl — ABNORMAL HIGH (ref 78.0–100.0)
Monocytes Absolute: 0.8 10*3/uL (ref 0.1–1.0)
Monocytes Relative: 10.4 % (ref 3.0–12.0)
Neutro Abs: 5.4 10*3/uL (ref 1.4–7.7)
Neutrophils Relative %: 67.1 % (ref 43.0–77.0)
Platelets: 217 10*3/uL (ref 150.0–400.0)
RBC: 4.29 Mil/uL (ref 4.22–5.81)
RDW: 12.9 % (ref 11.5–15.5)
WBC: 8 10*3/uL (ref 4.0–10.5)

## 2023-05-16 LAB — URINALYSIS, ROUTINE W REFLEX MICROSCOPIC
Bilirubin Urine: NEGATIVE
Ketones, ur: NEGATIVE
Leukocytes,Ua: NEGATIVE
Nitrite: NEGATIVE
Specific Gravity, Urine: 1.015 (ref 1.000–1.030)
Total Protein, Urine: NEGATIVE
Urine Glucose: NEGATIVE
Urobilinogen, UA: 1 (ref 0.0–1.0)
pH: 7.5 (ref 5.0–8.0)

## 2023-05-16 LAB — HEPATIC FUNCTION PANEL
ALT: 14 U/L (ref 0–53)
AST: 17 U/L (ref 0–37)
Albumin: 4.2 g/dL (ref 3.5–5.2)
Alkaline Phosphatase: 67 U/L (ref 39–117)
Bilirubin, Direct: 0.4 mg/dL — ABNORMAL HIGH (ref 0.0–0.3)
Total Bilirubin: 1.4 mg/dL — ABNORMAL HIGH (ref 0.2–1.2)
Total Protein: 7 g/dL (ref 6.0–8.3)

## 2023-05-16 LAB — LIPID PANEL
Cholesterol: 87 mg/dL (ref 0–200)
HDL: 39.1 mg/dL (ref >39.00)
LDL Cholesterol: 38 mg/dL (ref 0–99)
NonHDL: 47.58
Total CHOL/HDL Ratio: 2
Triglycerides: 46 mg/dL (ref 0.0–149.0)
VLDL: 9.2 mg/dL (ref 0.0–40.0)

## 2023-05-16 LAB — VITAMIN B12: Vitamin B-12: >1501 pg/mL — ABNORMAL HIGH (ref 211–911)

## 2023-05-16 LAB — BASIC METABOLIC PANEL
BUN: 20 mg/dL (ref 6–23)
CO2: 32 mEq/L (ref 19–32)
Calcium: 9.9 mg/dL (ref 8.4–10.5)
Chloride: 103 mEq/L (ref 96–112)
Creatinine, Ser: 1.05 mg/dL (ref 0.40–1.50)
GFR: 67.74 mL/min (ref >60.00)
Glucose, Bld: 96 mg/dL (ref 70–99)
Potassium: 4.7 mEq/L (ref 3.5–5.1)
Sodium: 140 mEq/L (ref 135–145)

## 2023-05-16 LAB — TSH: TSH: 0.89 u[IU]/mL (ref 0.35–5.50)

## 2023-05-16 LAB — VITAMIN D 25 HYDROXY (VIT D DEFICIENCY, FRACTURES): VITD: 56.08 ng/mL (ref 30.00–100.00)

## 2023-05-16 LAB — HEMOGLOBIN A1C: Hgb A1c MFr Bld: 5.5 % (ref 4.6–6.5)

## 2023-05-16 MED ORDER — ATORVASTATIN CALCIUM 80 MG PO TABS: 80.0000 mg | ORAL_TABLET | Freq: Every day | ORAL | 3 refills | Status: DC

## 2023-05-16 MED ORDER — CYCLOBENZAPRINE HCL 5 MG PO TABS: 5.0000 mg | ORAL_TABLET | Freq: Every day | ORAL | 3 refills | Status: DC

## 2023-05-16 NOTE — Progress Notes (Signed)
The test results show that your current treatment is OK, as the tests are stable.  Please continue the same plan.  There is no other need for change of treatment or further evaluation based on these results, at this time.  thanks 

## 2023-05-16 NOTE — Patient Instructions (Addendum)
Ok to change the lipitor 40 mg back to the 80 mg per day  Please take all new medication as prescribed  - the flexeril 5 mg at bedtime   Please continue all other medications as before, and refills have been done if requested.  Please have the pharmacy call with any other refills you may need.  Please continue your efforts at being more active, low cholesterol diet, and weight control.  You are otherwise up to date with prevention measures today.  Please keep your appointments with your specialists as you may have planned  Please go to the LAB at the blood drawing area for the tests to be done  You will be contacted by phone if any changes need to be made immediately.  Otherwise, you will receive a letter about your results with an explanation, but please check with MyChart first.  Please remember to sign up for MyChart if you have not done so, as this will be important to you in the future with finding out test results, communicating by private email, and scheduling acute appointments online when needed.  Please make an Appointment to return for your 1 year visit, or sooner if needed, with Lab testing by Appointment as well, to be done about 3-5 days before at the FIRST FLOOR Lab (so this is for TWO appointments - please see the scheduling desk as you leave)

## 2023-05-16 NOTE — Progress Notes (Addendum)
Patient ID: Blake [name], male   DOB: 1946/09/27, 77 y.o.   MRN: [id number]         Chief Complaint:: wellness exam and hld,        HPI:  Blake [name] is a 77 y.o. male here for wellness exam; declines covid booster, o/w up to date                        Also Pt denies chest pain, increased sob or doe, wheezing, orthopnea, PND, increased LE swelling, palpitations, dizziness or syncope.  Pt denies polydipsia, polyuria, or new focal neuro s/s.    Pt denies fever,, night sweats, loss of appetite, or other constitutional symptoms  Has lost consderable wt recently for unclear reason, has less appetitie.  Denies worsening reflux, abd pain, dysphagia, n/v, bowel change or blood. Also having chronic almost nighty leg cramps   Wt Readings from Last 3 Encounters:  05/16/23 164 lb (74.4 kg)  08/20/22 200 lb (90.7 kg)  12/26/21 201 lb (91.2 kg)   BP Readings from Last 3 Encounters:  05/16/23 108/64  08/20/22 96/70  12/26/21 108/64   Immunization History  Administered Date(s) Administered   Influenza Whole 07/29/2008   Influenza, High Dose Seasonal PF 08/07/2014, 09/28/2017, 07/17/2018, 08/17/2019   Influenza,inj,Quad PF,6+ Mos 06/28/2016   Influenza-Unspecified 07/22/2013, 08/18/2015, 08/22/2016   PFIZER(Purple Top)SARS-COV-2 Vaccination 11/12/2019, 12/04/2019, 07/25/2020, 04/26/2021, 08/22/2021   Pneumococcal Conjugate-13 02/27/2017   Pneumococcal Polysaccharide-23 07/17/2018   Tdap 06/07/2020   Tetanus 12/11/2008   Zoster Recombinant(Shingrix) 06/30/2020, 12/09/2020   Zoster, Live 07/22/2013   Health Maintenance Due  Topic Date Due   Medicare Annual Wellness (AWV)  12/14/2021      Past Medical History:  Diagnosis Date   Angina    none since CABG   Arthritis    Bladder spasm    takes Levsin as needed   CAD (coronary artery disease)    Diverticulosis    Duodenal ulcer    hx of   Dysrhythmia    atrial fib post Cabg/ takes Eliquis daily as well as Metoprolol    GERD  (gastroesophageal reflux disease)    takes Protonix daily   History of blood transfusion    not abnormal reaction   History of colon polyps    benign   History of kidney stones    hx of   Hyperlipidemia    takes Atorvastatin daily   HYPERLIPIDEMIA 05/25/2009   Joint pain    Nocturia    PAF (paroxysmal atrial fibrillation) (HCC)    Past Surgical History:  Procedure Laterality Date   CARDIAC CATHETERIZATION     6/12   COLONOSCOPY     CORONARY ARTERY BYPASS GRAFT  03/27/2011   x3, Dr Kathlee Nations Trigt     ESOPHAGOGASTRODUODENOSCOPY ENDOSCOPY  04/12/11   cauterization of bleeding ulcer  and artery in duodenum and stomach    INGUINAL HERNIA REPAIR  11/06/2011   Procedure: HERNIA REPAIR INGUINAL ADULT;  Surgeon: Velora Heckler, MD;  Location: [facility];  Service: General;  Laterality: N/A;  Repair left inguinal hernia with mesh   WISDOM TOOTH EXTRACTION      reports that he quit smoking about 35 years ago. His smoking use included cigarettes. He started smoking about 60 years ago. He has a 25 pack-year smoking history. He has never used smokeless tobacco. He reports current alcohol use of about 1.0 standard drink of alcohol per week. He reports that he does  not use drugs. family history includes Atrial fibrillation in his brother; Cancer in his father; Coronary artery disease in his father; Diabetes in his sister; Heart attack (age of onset: 71) in his sister; Heart attack (age of onset: 81) in his father; Lymphoma in an other family member; Ovarian cancer in his sister. No Known Allergies Current Outpatient Medications on File Prior to Visit  Medication Sig Dispense Refill   alfuzosin (UROXATRAL) 10 MG 24 hr tablet Take 10 mg by mouth daily with breakfast.     apixaban (ELIQUIS) 5 MG TABS tablet Take 1 tablet (5 mg total) by mouth 2 (two) times daily. 180 tablet 1   azelastine (ASTELIN) 0.1 % nasal spray Place 2 sprays into both nostrils 2 (two) times daily. Use in each nostril as directed 90 mL  1   metoprolol tartrate (LOPRESSOR) 50 MG tablet TAKE 1 TABLET BY MOUTH TWICE A DAY 180 tablet 1   No current facility-administered medications on file prior to visit.        ROS:  All others reviewed and negative.  Objective        PE:  BP 108/64 (BP Location: Left Arm, Patient Position: Sitting, Cuff Size: Normal)   Pulse 92   Temp 98.4 F (36.9 C) (Oral)   Ht 5\' 7"  (1.702 m)   Wt 164 lb (74.4 kg)   SpO2 98%   BMI 25.69 kg/m                 Constitutional: Pt appears in NAD               HENT: Head: NCAT.                Right Ear: External ear normal.                 Left Ear: External ear normal.                Eyes: . Pupils are equal, round, and reactive to light. Conjunctivae and EOM are normal               Nose: without d/c or deformity               Neck: Neck supple. Gross normal ROM               Cardiovascular: Normal rate and regular rhythm.                 Pulmonary/Chest: Effort normal and breath sounds without rales or wheezing.                Abd:  Soft, NT, ND, + BS, no organomegaly               Neurological: Pt is alert. At baseline orientation, motor grossly intact               Skin: Skin is warm. No rashes, no other new lesions, LE edema - none               Psychiatric: Pt behavior is normal without agitation   Micro: none  Cardiac tracings I have personally interpreted today:  none  Pertinent Radiological findings (summarize): none   Lab Results  Component Value Date   WBC 8.0 05/16/2023   HGB 14.3 05/16/2023   HCT 43.6 05/16/2023   PLT 217.0 05/16/2023   GLUCOSE 96 05/16/2023   CHOL 87 05/16/2023   TRIG 46.0 05/16/2023   HDL 39.10  05/16/2023   LDLCALC 38 05/16/2023   ALT 14 05/16/2023   AST 17 05/16/2023   NA 140 05/16/2023   K 4.7 05/16/2023   CL 103 05/16/2023   CREATININE 1.05 05/16/2023   BUN 20 05/16/2023   CO2 32 05/16/2023   TSH 0.89 05/16/2023   PSA 7.41 (H) 12/26/2021   INR 1.05 01/29/2017   HGBA1C 5.5 05/16/2023    Assessment/Plan:  Blake [name] is a 77 y.o. White or Caucasian [1] male with  has a past medical history of Angina, Arthritis, Bladder spasm, CAD (coronary artery disease), Diverticulosis, Duodenal ulcer, Dysrhythmia, GERD (gastroesophageal reflux disease), History of blood transfusion, History of colon polyps, History of kidney stones, Hyperlipidemia, HYPERLIPIDEMIA (05/25/2009), Joint pain, Nocturia, and PAF (paroxysmal atrial fibrillation) (HCC).  Encounter for well adult exam with abnormal findings Age and sex appropriate education and counseling updated with regular exercise and diet Referrals for preventative services - none needed Immunizations addressed - decliens covid booster Smoking counseling  - none needed Evidence for depression or other mood disorder - none significant Most recent labs reviewed. I have personally reviewed and have noted: 1) the patient's medical and social history 2) The patient's current medications and supplements 3) The patient's height, weight, and BMI have been recorded in the chart   Weight loss Etiology unclear, exam benign, for labs today  Dyslipidemia, goal LDL below 70 Lab Results  Component Value Date   LDLCALC 38 05/16/2023   Stable, pt to continue current statin lipitor 80 qd   Vitamin D deficiency Last vitamin D Lab Results  Component Value Date   VD25OH 56.08 05/16/2023   Stable, cont oral replacement   B12 deficiency Lab Results  Component Value Date   VITAMINB12 >1501 (H) 05/16/2023   Stable, cont oral replacement - b12 1000 mcg qd   PSA elevation Lab Results  Component Value Date   PSA 7.41 (H) 12/26/2021   PSA 8.86 (H) 04/26/2021   PSA 5.6 12/09/2019   Overall stable, cont currrent med tx, f/u urology as planned  Diastolic dysfunction with chronic heart failure (HCC) Stable volume,  to f/u any worsening symptoms or concerns  Nocturnal leg cramps Mild to mod, for flexeril 5 mg at bedtime prn,  to f/u any  worsening symptoms or concerns  Followup: Return in about 1 year (around 05/15/2024).  Oliver Barre, MD 05/18/2023 9:21 PM [facility] [place] [facility]

## 2023-05-18 ENCOUNTER — Encounter: Payer: Self-pay | Admitting: Internal Medicine

## 2023-05-18 ENCOUNTER — Other Ambulatory Visit: Payer: Self-pay | Admitting: Internal Medicine

## 2023-05-18 DIAGNOSIS — E538 Deficiency of other specified B group vitamins: Secondary | ICD-10-CM | POA: Insufficient documentation

## 2023-05-18 DIAGNOSIS — R634 Abnormal weight loss: Secondary | ICD-10-CM | POA: Insufficient documentation

## 2023-05-18 DIAGNOSIS — E559 Vitamin D deficiency, unspecified: Secondary | ICD-10-CM | POA: Insufficient documentation

## 2023-05-18 NOTE — Assessment & Plan Note (Signed)
Lab Results  Component Value Date   PSA 7.41 (H) 12/26/2021   PSA 8.86 (H) 04/26/2021   PSA 5.6 12/09/2019   Overall stable, cont currrent med tx, f/u urology as planned

## 2023-05-18 NOTE — Assessment & Plan Note (Signed)
Etiology unclear, exam benign, for labs today

## 2023-05-18 NOTE — Assessment & Plan Note (Signed)
Lab Results  Component Value Date   LDLCALC 38 05/16/2023   Stable, pt to continue current statin lipitor 80 qd

## 2023-05-18 NOTE — Assessment & Plan Note (Signed)
Stable volume,  to f/u any worsening symptoms or concerns 

## 2023-05-18 NOTE — Assessment & Plan Note (Signed)
Age and sex appropriate education and counseling updated with regular exercise and diet Referrals for preventative services - none needed Immunizations addressed - decliens covid booster Smoking counseling  - none needed Evidence for depression or other mood disorder - none significant Most recent labs reviewed. I have personally reviewed and have noted: 1) the patient's medical and social history 2) The patient's current medications and supplements 3) The patient's height, weight, and BMI have been recorded in the chart

## 2023-05-18 NOTE — Assessment & Plan Note (Signed)
Last vitamin D Lab Results  Component Value Date   VD25OH 56.08 05/16/2023   Stable, cont oral replacement

## 2023-05-18 NOTE — Addendum Note (Signed)
Addended by: Corwin Levins on: 05/18/2023 09:22 PM   Modules accepted: Orders

## 2023-05-18 NOTE — Assessment & Plan Note (Signed)
Lab Results  Component Value Date   VITAMINB12 >1501 (H) 05/16/2023   Stable, cont oral replacement - b12 1000 mcg qd

## 2023-05-18 NOTE — Assessment & Plan Note (Signed)
Mild to mod, for flexeril 5 mg at bedtime prn,  to f/u any worsening symptoms or concerns

## 2023-05-18 NOTE — Addendum Note (Signed)
Addended by: Corwin Levins on: 05/18/2023 09:21 PM   Modules accepted: Level of Service

## 2023-06-06 ENCOUNTER — Other Ambulatory Visit: Payer: Self-pay | Admitting: Internal Medicine

## 2023-06-19 DIAGNOSIS — L723 Sebaceous cyst: Secondary | ICD-10-CM | POA: Diagnosis not present

## 2023-06-19 DIAGNOSIS — Z85828 Personal history of other malignant neoplasm of skin: Secondary | ICD-10-CM | POA: Diagnosis not present

## 2023-06-19 DIAGNOSIS — D0439 Carcinoma in situ of skin of other parts of face: Secondary | ICD-10-CM | POA: Diagnosis not present

## 2023-06-19 DIAGNOSIS — L821 Other seborrheic keratosis: Secondary | ICD-10-CM | POA: Diagnosis not present

## 2023-06-19 DIAGNOSIS — D1801 Hemangioma of skin and subcutaneous tissue: Secondary | ICD-10-CM | POA: Diagnosis not present

## 2023-07-11 DIAGNOSIS — K080 Exfoliation of teeth due to systemic causes: Secondary | ICD-10-CM | POA: Diagnosis not present

## 2023-07-17 DIAGNOSIS — H52203 Unspecified astigmatism, bilateral: Secondary | ICD-10-CM | POA: Diagnosis not present

## 2023-07-29 DIAGNOSIS — H25013 Cortical age-related cataract, bilateral: Secondary | ICD-10-CM | POA: Diagnosis not present

## 2023-07-29 DIAGNOSIS — H2513 Age-related nuclear cataract, bilateral: Secondary | ICD-10-CM | POA: Diagnosis not present

## 2023-09-10 DIAGNOSIS — H25812 Combined forms of age-related cataract, left eye: Secondary | ICD-10-CM | POA: Diagnosis not present

## 2023-09-10 DIAGNOSIS — H25042 Posterior subcapsular polar age-related cataract, left eye: Secondary | ICD-10-CM | POA: Diagnosis not present

## 2023-09-10 DIAGNOSIS — H25012 Cortical age-related cataract, left eye: Secondary | ICD-10-CM | POA: Diagnosis not present

## 2023-09-10 DIAGNOSIS — H2512 Age-related nuclear cataract, left eye: Secondary | ICD-10-CM | POA: Diagnosis not present

## 2023-09-15 ENCOUNTER — Other Ambulatory Visit: Payer: Self-pay | Admitting: Physician Assistant

## 2023-09-16 ENCOUNTER — Encounter: Payer: Self-pay | Admitting: Pharmacist

## 2023-09-16 ENCOUNTER — Encounter: Payer: Self-pay | Admitting: Family Medicine

## 2023-09-16 ENCOUNTER — Ambulatory Visit (INDEPENDENT_AMBULATORY_CARE_PROVIDER_SITE_OTHER): Payer: Medicare Other | Admitting: Family Medicine

## 2023-09-16 VITALS — BP 126/60 | HR 58 | Temp 98.0°F | Resp 20 | Ht 67.0 in | Wt 180.0 lb

## 2023-09-16 DIAGNOSIS — R42 Dizziness and giddiness: Secondary | ICD-10-CM | POA: Diagnosis not present

## 2023-09-16 LAB — TSH: TSH: 0.89 u[IU]/mL (ref 0.35–5.50)

## 2023-09-16 NOTE — Progress Notes (Signed)
Assessment & Plan:  1. Dizziness Education provided on dizziness.  Advised patient to hold Myrbetriq to see if symptoms resolve.  Labs to assess for an identifiable cause. - CBC with Differential/Platelet - Comprehensive metabolic panel - TSH   Follow up plan: Return in about 2 weeks (around 09/30/2023) for dizziness f/u with PCP.  Blake Boston, MSN, APRN, FNP-C  Subjective:  HPI: Blake [name] is a 77 y.o. male presenting on 09/16/2023 for Dizziness (Lightheaded. He has had several minor issues with this over the last 2 months, most only rare and lasting 5-10 sec./The last 4 days, this has happened more often and vertigo worse than usual./)  Patient reports feeling lightheaded, like the room is spinning.  This has happened several times over the past two months and typically only last 5-10 seconds.  However over the past week he is feeling this way more oftenl.  He denies any headache, ear pain/pressure respiratory symptoms, chest pain, and shortness of breath.  He has atrial fibrillation and does not feel his rhythm irregularities.  Symptoms are not brought on by changing positions or rolling over in bed.  He has been using eyedrops since an eye surgery last week, but the increase in symptoms started prior to that.  Myrbetriq is a new medication for him that he started two months ago.    ROS: Negative unless specifically indicated above in HPI.   Relevant past medical history reviewed and updated as indicated.   Allergies and medications reviewed and updated.   Current Outpatient Medications:    apixaban (ELIQUIS) 5 MG TABS tablet, Take 1 tablet (5 mg total) by mouth 2 (two) times daily., Disp: 180 tablet, Rfl: 1   atorvastatin (LIPITOR) 80 MG tablet, Take 1 tablet (80 mg total) by mouth daily., Disp: 90 tablet, Rfl: 3   Cholecalciferol (VITAMIN D3) 50 MCG (2000 UT) CAPS, Take by mouth., Disp: , Rfl:    cyclobenzaprine (FLEXERIL) 5 MG tablet, Take 1 tablet (5 mg total) by  mouth at bedtime., Disp: 90 tablet, Rfl: 3   Magnesium 250 MG CAPS, Take by mouth., Disp: , Rfl:    metoprolol tartrate (LOPRESSOR) 50 MG tablet, TAKE 1 TABLET BY MOUTH 2 TIMES A DAY, Disp: 60 tablet, Rfl: 0   MYRBETRIQ 25 MG TB24 tablet, Take 25 mg by mouth daily., Disp: , Rfl:    vitamin B-12 (CYANOCOBALAMIN) 500 MCG tablet, Take 500 mcg by mouth daily., Disp: , Rfl:   No Known Allergies  Objective:   BP 126/60   Pulse (!) 58   Temp 98 F (36.7 C)   Resp 20   Ht 5\' 7"  (1.702 m)   Wt 180 lb (81.6 kg)   BMI 28.19 kg/m    Physical Exam Vitals reviewed.  Constitutional:      General: He is not in acute distress.    Appearance: Normal appearance. He is not ill-appearing, toxic-appearing or diaphoretic.  HENT:     Head: Normocephalic and atraumatic.     Right Ear: Tympanic membrane, ear canal and external ear normal. There is no impacted cerumen.     Left Ear: Tympanic membrane, ear canal and external ear normal. There is no impacted cerumen.  Eyes:     General: No scleral icterus.       Right eye: No discharge.        Left eye: No discharge.     Conjunctiva/sclera: Conjunctivae normal.  Cardiovascular:     Rate and Rhythm: Normal rate. Rhythm irregular.  Heart sounds: Normal heart sounds. No murmur heard.    No friction rub. No gallop.  Pulmonary:     Effort: Pulmonary effort is normal. No respiratory distress.     Breath sounds: Normal breath sounds. No stridor. No wheezing, rhonchi or rales.  Musculoskeletal:        General: Normal range of motion.     Cervical back: Normal range of motion.  Skin:    General: Skin is warm and dry.  Neurological:     Mental Status: He is alert and oriented to person, place, and time. Mental status is at baseline.  Psychiatric:        Mood and Affect: Mood normal.        Behavior: Behavior normal.        Thought Content: Thought content normal.        Judgment: Judgment normal.

## 2023-09-16 NOTE — Progress Notes (Signed)
Pharmacy Quality Measure Review  This patient is appearing on a report for being at risk of failing the adherence measure for cholesterol (statin) medications this calendar year.   Medication: Atorvastatin 80 mg Last fill date: 08/14/2023 for 90 day supply  Insurance report was not up to date. No action needed at this time.   Arbutus Leas, PharmD, BCPS Clinical Pharmacist [facility] [phone]

## 2023-09-16 NOTE — Patient Instructions (Signed)
Hold Myrbetriq to rule it in or out as a cause of your dizziness.

## 2023-09-17 LAB — CBC WITH DIFFERENTIAL/PLATELET
Basophils Absolute: 0.1 10*3/uL (ref 0.0–0.1)
Basophils Relative: 0.9 % (ref 0.0–3.0)
Eosinophils Absolute: 0.2 10*3/uL (ref 0.0–0.7)
Eosinophils Relative: 1.9 % (ref 0.0–5.0)
HCT: 40 % (ref 39.0–52.0)
Hemoglobin: 13.4 g/dL (ref 13.0–17.0)
Lymphocytes Relative: 21.1 % (ref 12.0–46.0)
Lymphs Abs: 2 10*3/uL (ref 0.7–4.0)
MCHC: 33.7 g/dL (ref 30.0–36.0)
MCV: 99.8 fL (ref 78.0–100.0)
Monocytes Absolute: 0.9 10*3/uL (ref 0.1–1.0)
Monocytes Relative: 9.3 % (ref 3.0–12.0)
Neutro Abs: 6.3 10*3/uL (ref 1.4–7.7)
Neutrophils Relative %: 66.8 % (ref 43.0–77.0)
Platelets: 198 10*3/uL (ref 150.0–400.0)
RBC: 4 Mil/uL — ABNORMAL LOW (ref 4.22–5.81)
RDW: 13.1 % (ref 11.5–15.5)
WBC: 9.4 10*3/uL (ref 4.0–10.5)

## 2023-09-17 LAB — COMPREHENSIVE METABOLIC PANEL
ALT: 17 U/L (ref 0–53)
AST: 18 U/L (ref 0–37)
Albumin: 4.3 g/dL (ref 3.5–5.2)
Alkaline Phosphatase: 67 U/L (ref 39–117)
BUN: 19 mg/dL (ref 6–23)
CO2: 26 meq/L (ref 19–32)
Calcium: 9.5 mg/dL (ref 8.4–10.5)
Chloride: 105 meq/L (ref 96–112)
Creatinine, Ser: 0.93 mg/dL (ref 0.40–1.50)
GFR: 78.17 mL/min (ref >60.00)
Glucose, Bld: 84 mg/dL (ref 70–99)
Potassium: 4.5 meq/L (ref 3.5–5.1)
Sodium: 138 meq/L (ref 135–145)
Total Bilirubin: 0.9 mg/dL (ref 0.2–1.2)
Total Protein: 7 g/dL (ref 6.0–8.3)

## 2023-09-24 DIAGNOSIS — H25811 Combined forms of age-related cataract, right eye: Secondary | ICD-10-CM | POA: Diagnosis not present

## 2023-09-24 DIAGNOSIS — H25011 Cortical age-related cataract, right eye: Secondary | ICD-10-CM | POA: Diagnosis not present

## 2023-09-24 DIAGNOSIS — H2511 Age-related nuclear cataract, right eye: Secondary | ICD-10-CM | POA: Diagnosis not present

## 2023-09-30 ENCOUNTER — Ambulatory Visit: Payer: Medicare Other | Admitting: Internal Medicine

## 2023-10-14 ENCOUNTER — Other Ambulatory Visit: Payer: Self-pay | Admitting: Physician Assistant

## 2023-10-20 ENCOUNTER — Other Ambulatory Visit: Payer: Self-pay | Admitting: Urology

## 2023-11-02 ENCOUNTER — Other Ambulatory Visit: Payer: Self-pay | Admitting: Physician Assistant

## 2023-11-04 DIAGNOSIS — L821 Other seborrheic keratosis: Secondary | ICD-10-CM | POA: Diagnosis not present

## 2023-11-04 DIAGNOSIS — L820 Inflamed seborrheic keratosis: Secondary | ICD-10-CM | POA: Diagnosis not present

## 2023-11-04 DIAGNOSIS — Z85828 Personal history of other malignant neoplasm of skin: Secondary | ICD-10-CM | POA: Diagnosis not present

## 2023-11-19 NOTE — Progress Notes (Signed)
HPI: Followup coronary artery disease and atrial fibrillation. Abdominal CT in 2012 showed no aneurysm. Patient had coronary artery bypassing graft in 2012 with a LIMA to the LAD, saphenous vein graft to the first diagonal and saphenous vein graft to the acute marginal. Monitor in October of 2014 showed paroxysmal atrial fibrillation. Nuclear study in October 2014 showed an ejection fraction of 63% and normal perfusion. Echocardiogram March 2021 showed normal LV function, mild left ventricular hypertrophy, mild biatrial enlargement, mild mitral regurgitation, trace aortic insufficiency.  Since last seen, the patient denies any dyspnea on exertion, orthopnea, PND, pedal edema, palpitations, syncope or chest pain.  He is having difficulties with cramping in his calfs intermittently.     Past Medical History:  Diagnosis Date   Angina    none since CABG   Arthritis    Bladder spasm    takes Levsin as needed   CAD (coronary artery disease)    Diverticulosis    Duodenal ulcer    hx of   Dysrhythmia    atrial fib post Cabg/ takes Eliquis daily as well as Metoprolol    GERD (gastroesophageal reflux disease)    takes Protonix daily   History of blood transfusion    not abnormal reaction   History of colon polyps    benign   History of kidney stones    hx of   Hyperlipidemia    takes Atorvastatin daily   HYPERLIPIDEMIA 05/25/2009   Joint pain    Nocturia    PAF (paroxysmal atrial fibrillation) (HCC)     Past Surgical History:  Procedure Laterality Date   CARDIAC CATHETERIZATION     6/12   COLONOSCOPY     CORONARY ARTERY BYPASS GRAFT  03/27/2011   x3, Dr Kathlee Nations Trigt     ESOPHAGOGASTRODUODENOSCOPY ENDOSCOPY  04/12/11   cauterization of bleeding ulcer  and artery in duodenum and stomach    INGUINAL HERNIA REPAIR  11/06/2011   Procedure: HERNIA REPAIR INGUINAL ADULT;  Surgeon: Velora Heckler, MD;  Location: [facility];  Service: General;  Laterality: N/A;  Repair left inguinal hernia  with mesh   WISDOM TOOTH EXTRACTION      Social History   Socioeconomic History   Marital status: Married    Spouse name: Not on file   Number of children: 4   Years of education: Not on file   Highest education level: Not on file  Occupational History   Occupation: retired   Tobacco Use   Smoking status: Former    Current packs/day: 0.00    Average packs/day: 1 pack/day for 25.0 years (25.0 ttl pk-yrs)    Types: Cigarettes    Start date: 03/05/1963    Quit date: 03/04/1988    Years since quitting: 35.7   Smokeless tobacco: Never  Vaping Use   Vaping status: Never Used  Substance and Sexual Activity   Alcohol use: Yes    Alcohol/week: 1.0 standard drink of alcohol    Types: 1 Cans of beer per week    Comment: occ   Drug use: No   Sexual activity: Not on file  Other Topics Concern   Not on file  Social History Narrative   Regular exercise- yes    Social Drivers of Health   Financial Resource Strain: Low Risk  (12/14/2020)   Overall Financial Resource Strain (CARDIA)    Difficulty of Paying Living Expenses: Not hard at all  Food Insecurity: No Food Insecurity (12/14/2020)   Hunger  Vital Sign    Worried About Programme researcher, broadcasting/film/video in the Last Year: Never true    Ran Out of Food in the Last Year: Never true  Transportation Needs: No Transportation Needs (12/14/2020)   PRAPARE - Administrator, Civil Service (Medical): No    Lack of Transportation (Non-Medical): No  Physical Activity: Sufficiently Active (12/14/2020)   Exercise Vital Sign    Days of Exercise per Week: 5 days    Minutes of Exercise per Session: 30 min  Stress: No Stress Concern Present (12/14/2020)   [employer] of Occupational Health - Occupational Stress Questionnaire    Feeling of Stress : Not at all  Social Connections: Socially Integrated (12/14/2020)   Social Connection and Isolation Panel [NHANES]    Frequency of Communication with Friends and Family: More than three times a week     Frequency of Social Gatherings with Friends and Family: Once a week    Attends Religious Services: More than 4 times per year    Active Member of [employer] or Organizations: Yes    Attends Engineer, structural: More than 4 times per year    Marital Status: Married  Catering manager Violence: Not on file    Family History  Problem Relation Age of Onset   Coronary artery disease Father    Heart attack Father 52   Cancer Father    Lymphoma Other    Diabetes Sister    Heart attack Sister 59   Ovarian cancer Sister    Atrial fibrillation Brother    Colon cancer Neg Hx    Stomach cancer Neg Hx     ROS: no fevers or chills, productive cough, hemoptysis, dysphasia, odynophagia, melena, hematochezia, dysuria, hematuria, rash, seizure activity, orthopnea, PND, pedal edema, claudication. Remaining systems are negative.  Physical Exam: Well-developed well-nourished in no acute distress.  Skin is warm and dry.  HEENT is normal.  Neck is supple.  Chest is clear to auscultation with normal expansion.  Cardiovascular exam is irregular Abdominal exam nontender or distended. No masses palpated. Extremities show no edema. neuro grossly intact  EKG Interpretation Date/Time:  Tuesday December 03 2023 11:01:53 EST Ventricular Rate:  99 PR Interval:    QRS Duration:  90 QT Interval:  322 QTC Calculation: 413 R Axis:   17  Text Interpretation: Atrial fibrillation Confirmed by Olga Millers ([id number]) on 12/03/2023 11:06:06 AM    A/P  1 coronary artery disease-patient denies chest pain.  Continue statin.  He is not on aspirin given need for apixaban.  2 permanent atrial fibrillation-continue present dose of apixaban and metoprolol.  3 mitral regurgitation-mild on most recent echocardiogram. Will repeat.  4 chronic diastolic congestive heart failure-he appears to be euvolemic on examination.  5 hypertension-patient's blood pressure is controlled.  Continue present medical  regimen.  6 hyperlipidemia-He is complaining of cramping in his calves.  Question if this was related to Lipitor.  We will hold for 6 weeks.  If his symptoms improve we will try a different statin.  Could also consider PCSK9 inhibitor.  If his symptoms do not improve we will resume Lipitor at previous dose.  Olga Millers, MD

## 2023-12-03 ENCOUNTER — Ambulatory Visit: Payer: Medicare Other | Attending: Cardiology | Admitting: Cardiology

## 2023-12-03 ENCOUNTER — Encounter: Payer: Self-pay | Admitting: Cardiology

## 2023-12-03 VITALS — BP 100/60 | HR 99 | Ht 67.0 in | Wt 192.4 lb

## 2023-12-03 DIAGNOSIS — I340 Nonrheumatic mitral (valve) insufficiency: Secondary | ICD-10-CM

## 2023-12-03 DIAGNOSIS — I482 Chronic atrial fibrillation, unspecified: Secondary | ICD-10-CM

## 2023-12-03 DIAGNOSIS — I25810 Atherosclerosis of coronary artery bypass graft(s) without angina pectoris: Secondary | ICD-10-CM

## 2023-12-03 DIAGNOSIS — E785 Hyperlipidemia, unspecified: Secondary | ICD-10-CM

## 2023-12-03 NOTE — Patient Instructions (Addendum)
Medication Instructions:   DO NOT TAKE ATORVASTATIN FOR 6 WEEKS AND LET us KNOW HOW YOU ARE FEELING  *If you need a refill on your cardiac medications before your next appointment, please call your pharmacy*   Testing/Procedures:  Your physician has requested that you have an echocardiogram. Echocardiography is a painless test that uses sound waves to create images of your heart. It provides your doctor with information about the size and shape of your heart and how well your heart's chambers and valves are working. This procedure takes approximately one hour. There are no restrictions for this procedure. Please do NOT wear cologne, perfume, aftershave, or lotions (deodorant is allowed). Please arrive 15 minutes prior to your appointment time.  Please note: We ask at that you not bring children with you during ultrasound (echo/ vascular) testing. Due to room size and safety concerns, children are not allowed in the ultrasound rooms during exams. Our front office staff cannot provide observation of children in our lobby area while testing is being conducted. An adult accompanying a patient to their appointment will only be allowed in the ultrasound room at the discretion of the ultrasound technician under special circumstances. We apologize for any inconvenience. [address]   Follow-Up: At [facility], you and your health needs are our priority.  As part of our continuing mission to provide you with exceptional heart care, we have created designated Provider Care Teams.  These Care Teams include your primary Cardiologist (physician) and Advanced Practice Providers (APPs -  Physician Assistants and Nurse Practitioners) who all work together to provide you with the care you need, when you need it.  We recommend signing up for the patient portal called "MyChart".  Sign up information is provided on this After Visit Summary.  MyChart is used to connect with patients for Virtual  Visits (Telemedicine).  Patients are able to view lab/test results, encounter notes, upcoming appointments, etc.  Non-urgent messages can be sent to your provider as well.   To learn more about what you can do with MyChart, go to [email or web address].    Your next appointment:   12 month(s)  Provider:   Olga Millers, MD

## 2023-12-05 ENCOUNTER — Other Ambulatory Visit: Payer: Self-pay | Admitting: Physician Assistant

## 2023-12-19 ENCOUNTER — Ambulatory Visit (HOSPITAL_COMMUNITY): Payer: Medicare Other | Attending: Cardiovascular Disease

## 2023-12-19 DIAGNOSIS — I340 Nonrheumatic mitral (valve) insufficiency: Secondary | ICD-10-CM | POA: Diagnosis not present

## 2023-12-19 LAB — ECHOCARDIOGRAM COMPLETE
MV M vel: 4.94 m/s
MV Peak grad: 97.4 mm[Hg]
S' Lateral: 2.9 cm

## 2024-01-15 DIAGNOSIS — K080 Exfoliation of teeth due to systemic causes: Secondary | ICD-10-CM | POA: Diagnosis not present

## 2024-01-20 DIAGNOSIS — L821 Other seborrheic keratosis: Secondary | ICD-10-CM | POA: Diagnosis not present

## 2024-01-20 DIAGNOSIS — Z85828 Personal history of other malignant neoplasm of skin: Secondary | ICD-10-CM | POA: Diagnosis not present

## 2024-01-20 DIAGNOSIS — C4441 Basal cell carcinoma of skin of scalp and neck: Secondary | ICD-10-CM | POA: Diagnosis not present

## 2024-01-20 DIAGNOSIS — D225 Melanocytic nevi of trunk: Secondary | ICD-10-CM | POA: Diagnosis not present

## 2024-01-20 DIAGNOSIS — L723 Sebaceous cyst: Secondary | ICD-10-CM | POA: Diagnosis not present

## 2024-01-22 ENCOUNTER — Encounter: Payer: Self-pay | Admitting: Internal Medicine

## 2024-01-27 ENCOUNTER — Ambulatory Visit

## 2024-01-30 ENCOUNTER — Encounter: Payer: Self-pay | Admitting: Cardiology

## 2024-01-30 DIAGNOSIS — E785 Hyperlipidemia, unspecified: Secondary | ICD-10-CM

## 2024-02-03 MED ORDER — PRAVASTATIN SODIUM 40 MG PO TABS: 40.0000 mg | ORAL_TABLET | Freq: Every evening | ORAL | 3 refills | Status: AC

## 2024-02-03 MED ORDER — EZETIMIBE 10 MG PO TABS: 10.0000 mg | ORAL_TABLET | Freq: Every day | ORAL | 3 refills | Status: AC

## 2024-02-17 ENCOUNTER — Ambulatory Visit (INDEPENDENT_AMBULATORY_CARE_PROVIDER_SITE_OTHER)

## 2024-02-17 VITALS — Ht 67.0 in | Wt 192.0 lb

## 2024-02-17 DIAGNOSIS — Z0000 Encounter for general adult medical examination without abnormal findings: Secondary | ICD-10-CM

## 2024-02-17 NOTE — Progress Notes (Signed)
 Subjective:   Blake [name] is a 78 y.o. who presents for a Medicare Wellness preventive visit.  Visit Complete: Virtual I connected with  [name] on 02/17/24 by a audio enabled telemedicine application and verified that I am speaking with the correct person using two identifiers.  Patient Location: Home  Provider Location: Home Office  I discussed the limitations of evaluation and management by telemedicine. The patient expressed understanding and agreed to proceed.  Vital Signs: Because this visit was a virtual/telehealth visit, some criteria may be missing or patient reported. Any vitals not documented were not able to be obtained and vitals that have been documented are patient reported.  VideoDeclined- This patient declined Librarian, academic. Therefore the visit was completed with audio only.  Persons Participating in Visit: Patient.  AWV Questionnaire: No: Patient Medicare AWV questionnaire was not completed prior to this visit.  Cardiac Risk Factors include: advanced age (>45men, >71 women);male gender;dyslipidemia;Other (see comment), Risk factor comments: A-Fib, BPH     Objective:    Today's Vitals   02/17/24 0924  Weight: 192 lb (87.1 kg)  Height: 5\' 7"  (1.702 m)   Body mass index is 30.07 kg/m.     02/17/2024    9:41 AM 12/14/2020   11:30 AM 09/17/2017    9:49 AM 03/01/2017    3:14 PM 02/12/2017    5:32 AM 02/12/2017   12:48 AM 02/06/2017    3:08 PM  Advanced Directives  Does Patient Have a Medical Advance Directive? Yes No No Yes No No No  Type of Estate agent of [place];Living will   Healthcare Power of [place];Living will Healthcare Power of [place];Living will    Copy of Healthcare Power of Attorney in Chart? No - copy requested   Yes No - copy requested    Would patient like information on creating a medical advance directive?  No - Patient declined No - Patient declined  No - Patient declined       Current Medications (verified) Outpatient Encounter Medications as of 02/17/2024  Medication Sig   apixaban  (ELIQUIS ) 5 MG TABS tablet Take 1 tablet (5 mg total) by mouth 2 (two) times daily.   Cholecalciferol (VITAMIN D3) 50 MCG (2000 UT) CAPS Take by mouth.   cyclobenzaprine  (FLEXERIL ) 5 MG tablet Take 1 tablet (5 mg total) by mouth at bedtime.   ezetimibe  (ZETIA ) 10 MG tablet Take 1 tablet (10 mg total) by mouth daily.   Magnesium 250 MG CAPS Take by mouth.   metoprolol  tartrate (LOPRESSOR ) 50 MG tablet Take 1 tablet (50 mg total) by mouth 2 (two) times daily.   MYRBETRIQ 25 MG TB24 tablet Take 25 mg by mouth daily.   pravastatin  (PRAVACHOL ) 40 MG tablet Take 1 tablet (40 mg total) by mouth every evening.   vitamin B-12 (CYANOCOBALAMIN ) 500 MCG tablet Take 500 mcg by mouth daily.   No facility-administered encounter medications on file as of 02/17/2024.    Allergies (verified) Patient has no known allergies.   History: Past Medical History:  Diagnosis Date   Angina    none since CABG   Arthritis    Bladder spasm    takes Levsin as needed   CAD (coronary artery disease)    Diverticulosis    Duodenal ulcer    hx of   Dysrhythmia    atrial fib post Cabg/ takes Eliquis  daily as well as Metoprolol     GERD (gastroesophageal reflux disease)    takes Protonix   daily   History of blood transfusion    not abnormal reaction   History of colon polyps    benign   History of kidney stones    hx of   Hyperlipidemia    takes Atorvastatin  daily   HYPERLIPIDEMIA 05/25/2009   Joint pain    Nocturia    PAF (paroxysmal atrial fibrillation) [facility])    Past Surgical History:  Procedure Laterality Date   CARDIAC CATHETERIZATION     6/12   COLONOSCOPY     CORONARY ARTERY BYPASS GRAFT  03/27/2011   x3, Dr Jerlyn Moons Trigt     ESOPHAGOGASTRODUODENOSCOPY ENDOSCOPY  04/12/11   cauterization of bleeding ulcer  and artery in duodenum and stomach    INGUINAL HERNIA REPAIR  11/06/2011    Procedure: HERNIA REPAIR INGUINAL ADULT;  Surgeon: Keitha Pata, MD;  Location: [facility];  Service: General;  Laterality: N/A;  Repair left inguinal hernia with mesh   WISDOM TOOTH EXTRACTION     Family History  Problem Relation Age of Onset   Coronary artery disease Father    Heart attack Father 56   Cancer Father    Lymphoma Other    Diabetes Sister    Heart attack Sister 74   Ovarian cancer Sister    Atrial fibrillation Brother    Colon cancer Neg Hx    Stomach cancer Neg Hx    Social History   Socioeconomic History   Marital status: Married    Spouse name: Mary   Number of children: 4   Years of education: Not on file   Highest education level: Not on file  Occupational History   Occupation: retired   Tobacco Use   Smoking status: Former    Current packs/day: 0.00    Average packs/day: 1 pack/day for 25.0 years (25.0 ttl pk-yrs)    Types: Cigarettes    Start date: 03/05/1963    Quit date: 03/04/1988    Years since quitting: 35.9   Smokeless tobacco: Never  Vaping Use   Vaping status: Never Used  Substance and Sexual Activity   Alcohol use: Yes    Alcohol/week: 1.0 standard drink of alcohol    Types: 1 Cans of beer per week    Comment: occ   Drug use: No   Sexual activity: Not on file  Other Topics Concern   Not on file  Social History Narrative   Regular exercise- yes       Lives with wife   Social Drivers of Health   Financial Resource Strain: Low Risk  (02/17/2024)   Overall Financial Resource Strain (CARDIA)    Difficulty of Paying Living Expenses: Not hard at all  Food Insecurity: No Food Insecurity (02/17/2024)   Hunger Vital Sign    Worried About Running Out of Food in the Last Year: Never true    Ran Out of Food in the Last Year: Never true  Transportation Needs: No Transportation Needs (02/17/2024)   PRAPARE - Administrator, Civil Service (Medical): No    Lack of Transportation (Non-Medical): No  Physical Activity: Inactive (02/17/2024)    Exercise Vital Sign    Days of Exercise per Week: 0 days    Minutes of Exercise per Session: 0 min  Stress: No Stress Concern Present (02/17/2024)   [employer] of Occupational Health - Occupational Stress Questionnaire    Feeling of Stress : Not at all  Social Connections: Moderately Isolated (02/17/2024)   Social Connection and Isolation Panel [  NHANES]    Frequency of Communication with Friends and Family: More than three times a week    Frequency of Social Gatherings with Friends and Family: Twice a week    Attends Religious Services: Never    Diplomatic Services operational officer: Not on file    Attends Banker Meetings: Never    Marital Status: Married    Tobacco Counseling Counseling given: Not Answered    Clinical Intake:  Pre-visit preparation completed: Yes  Pain : No/denies pain     BMI - recorded: 30.07 Nutritional Status: BMI > 30  Obese Nutritional Risks: None Diabetes: No  Lab Results  Component Value Date   HGBA1C 5.5 05/16/2023   HGBA1C 6.0 12/26/2021   HGBA1C 5.8 11/07/2015     How often do you need to have someone help you when you read instructions, pamphlets, or other written materials from your doctor or pharmacy?: 1 - Never     Information entered by :: Jared Whorley, RMA   Activities of Daily Living     02/17/2024    9:37 AM  In your present state of health, do you have any difficulty performing the following activities:  Hearing? 1  Comment Wear hearing aides  Vision? 0  Difficulty concentrating or making decisions? 0  Walking or climbing stairs? 0  Dressing or bathing? 0  Doing errands, shopping? 0  Preparing Food and eating ? N  Using the Toilet? N  In the past six months, have you accidently leaked urine? Y  Do you have problems with loss of bowel control? N  Managing your Medications? N  Managing your Finances? N  Housekeeping or managing your Housekeeping? N    Patient Care Team: Roslyn Coombe, MD  as PCP - General (Internal Medicine) Audery Blazing Deannie Fabian, MD as PCP - Cardiology (Cardiology) Kristopher Pheasant, MD (Cardiology) Dorothe Gaster, RD as Dietitian (Family Medicine) Jonathan Neighbor, [facility] (Inactive) as Pharmacist (Pharmacist) Pa, [facility] as Consulting Physician (Ophthalmology)  Indicate any recent Medical Services you may have received from other than [facility] providers in the past year (date may be approximate).     Assessment:   This is a routine wellness examination for Dominik.  Hearing/Vision screen Hearing Screening - Comments:: Wear hearing aides Vision Screening - Comments:: Denies vision issues.    Goals Addressed               This Visit's Progress     Patient Stated (pt-stated)        Would like to loose 30 lb       Depression Screen    02/17/2024    9:43 AM 09/16/2023    2:51 PM 05/16/2023   10:09 AM 12/26/2021    3:47 PM 12/26/2021    3:14 PM 12/14/2020   10:23 AM 12/09/2019   10:19 AM  PHQ 2/9 Scores  PHQ - 2 Score 0 0 0 0 0 0 0  PHQ- 9 Score 2          Fall Risk     02/17/2024    9:41 AM 09/16/2023    2:50 PM 05/16/2023   10:09 AM 12/26/2021    3:47 PM 12/26/2021    3:14 PM  Fall Risk   Falls in the past year? 0 0 0 0 0  Number falls in past yr: 0 0 0 0 0  Injury with Fall? 0 0 0 0 0  Risk for fall due to :  No Fall Risks No Fall Risks No Fall Risks    Follow up Falls prevention discussed;Falls evaluation completed Falls evaluation completed Falls evaluation completed      MEDICARE RISK AT HOME:  Medicare Risk at Home Any stairs in or around the home?: Yes If so, are there any without handrails?: Yes Home free of loose throw rugs in walkways, pet beds, electrical cords, etc?: Yes Adequate lighting in your home to reduce risk of falls?: Yes Life alert?: No Use of a cane, walker or w/c?: No Grab bars in the bathroom?: Yes Shower chair or bench in shower?: Yes Elevated toilet seat or a handicapped toilet?:  Yes  TIMED UP AND GO:  Was the test performed?  No  Cognitive Function: 6CIT completed        Immunizations Immunization History  Administered Date(s) Administered   Influenza Whole 07/29/2008   Influenza, High Dose Seasonal PF 08/07/2014, 09/28/2017, 07/17/2018, 08/17/2019   Influenza,inj,Quad PF,6+ Mos 06/28/2016   Influenza-Unspecified 07/22/2013, 08/18/2015, 08/22/2016, 09/11/2023   PFIZER(Purple Top)SARS-COV-2 Vaccination 11/12/2019, 12/04/2019, 07/25/2020, 04/26/2021, 08/22/2021   Pneumococcal Conjugate-13 02/27/2017   Pneumococcal Polysaccharide-23 07/17/2018   Tdap 06/07/2020   Tetanus 12/11/2008   Zoster Recombinant(Shingrix ) 06/30/2020, 12/09/2020   Zoster, Live 07/22/2013    Screening Tests Health Maintenance  Topic Date Due   Medicare Annual Wellness (AWV)  12/14/2021   COVID-19 Vaccine (6 - 2024-25 season) 06/23/2023   Colonoscopy  11/20/2023   INFLUENZA VACCINE  05/22/2024   DTaP/Tdap/Td (2 - Td or Tdap) 06/07/2030   Pneumonia Vaccine 78+ Years old  Completed   Hepatitis C Screening  Completed   Zoster Vaccines- Shingrix   Completed   HPV VACCINES  Aged Out   Meningococcal B Vaccine  Aged Out    Health Maintenance  Health Maintenance Due  Topic Date Due   Medicare Annual Wellness (AWV)  12/14/2021   COVID-19 Vaccine (6 - 2024-25 season) 06/23/2023   Colonoscopy  11/20/2023   Health Maintenance Items Addressed: See Nurse Notes  Additional Screening:  Vision Screening: Recommended annual ophthalmology exams for early detection of glaucoma and other disorders of the eye.  Dental Screening: Recommended annual dental exams for proper oral hygiene  Community Resource Referral / Chronic Care Management: CRR required this visit?  No   CCM required this visit?  No     Plan:     I have personally reviewed and noted the following in the patient's chart:   Medical and social history Use of alcohol, tobacco or illicit drugs  Current medications  and supplements including opioid prescriptions. Patient is not currently taking opioid prescriptions. Functional ability and status Nutritional status Physical activity Advanced directives List of other physicians Hospitalizations, surgeries, and ER visits in previous 12 months Vitals Screenings to include cognitive, depression, and falls Referrals and appointments  In addition, I have reviewed and discussed with patient certain preventive protocols, quality metrics, and best practice recommendations. A written personalized care plan for preventive services as well as general preventive health recommendations were provided to patient.     Massa Pe L Kamyrah Feeser, CMA   02/17/2024   After Visit Summary: (MyChart) Due to this being a telephonic visit, the after visit summary with patients personalized plan was offered to patient via MyChart   Notes: Please refer to Routing Comments.

## 2024-02-17 NOTE — Patient Instructions (Addendum)
 Mr. Blake [name] , Thank you for taking time to come for your Medicare Wellness Visit. I appreciate your ongoing commitment to your health goals. Please review the following plan we discussed and let me know if I can assist you in the future.   Referrals/Orders/Follow-Ups/Clinician Recommendations: It was nice talking with you today.  Remember to call and get scheduled for your colonoscopy.  Each day, aim for 6 glasses of water , plenty of protein in your diet and try to get up and walk/ stretch every hour for 5-10 minutes at a time.    This is a list of the screening recommended for you and due dates:  Health Maintenance  Topic Date Due   COVID-19 Vaccine (6 - 2024-25 season) 06/23/2023   Colon Cancer Screening  11/20/2023   Flu Shot  05/22/2024   Medicare Annual Wellness Visit  02/16/2025   DTaP/Tdap/Td vaccine (2 - Td or Tdap) 06/07/2030   Pneumonia Vaccine  Completed   Hepatitis C Screening  Completed   Zoster (Shingles) Vaccine  Completed   HPV Vaccine  Aged Out   Meningitis B Vaccine  Aged Out    Advanced directives: (Copy Requested) Please bring a copy of your health care power of attorney and living will to the office to be added to your chart at your convenience. You can mail to [facility] [address] [place], [place] [address] or email to [email or web address]  Next Medicare Annual Wellness Visit scheduled for next year: Yes

## 2024-02-18 ENCOUNTER — Encounter: Payer: Self-pay | Admitting: Internal Medicine

## 2024-03-01 ENCOUNTER — Other Ambulatory Visit: Payer: Self-pay | Admitting: Cardiology

## 2024-03-01 DIAGNOSIS — I482 Chronic atrial fibrillation, unspecified: Secondary | ICD-10-CM

## 2024-03-02 NOTE — Telephone Encounter (Signed)
 Prescription refill request for Eliquis  received. Indication:afib Last office visit:2/25 Scr:0.93  11/24 Age: 78 Weight:87.1  kg  Prescription refilled

## 2024-03-31 DIAGNOSIS — E785 Hyperlipidemia, unspecified: Secondary | ICD-10-CM | POA: Diagnosis not present

## 2024-04-01 ENCOUNTER — Ambulatory Visit: Payer: Self-pay | Admitting: Cardiology

## 2024-04-01 LAB — LIPID PANEL
Chol/HDL Ratio: 3.2 ratio (ref 0.0–5.0)
Cholesterol, Total: 121 mg/dL (ref 100–199)
HDL: 38 mg/dL — ABNORMAL LOW
LDL Chol Calc (NIH): 68 mg/dL (ref 0–99)
Triglycerides: 73 mg/dL (ref 0–149)
VLDL Cholesterol Cal: 15 mg/dL (ref 5–40)

## 2024-04-01 LAB — HEPATIC FUNCTION PANEL
ALT: 16 IU/L (ref 0–44)
AST: 18 IU/L (ref 0–40)
Albumin: 4.2 g/dL (ref 3.8–4.8)
Alkaline Phosphatase: 64 IU/L (ref 44–121)
Bilirubin Total: 1 mg/dL (ref 0.0–1.2)
Bilirubin, Direct: 0.32 mg/dL (ref 0.00–0.40)
Total Protein: 6.8 g/dL (ref 6.0–8.5)

## 2024-04-22 ENCOUNTER — Telehealth: Payer: Self-pay

## 2024-04-22 ENCOUNTER — Encounter: Payer: Self-pay | Admitting: Internal Medicine

## 2024-04-22 ENCOUNTER — Ambulatory Visit: Admitting: Internal Medicine

## 2024-04-22 VITALS — BP 118/72 | HR 94 | Ht 67.0 in | Wt 183.0 lb

## 2024-04-22 DIAGNOSIS — Z860101 Personal history of adenomatous and serrated colon polyps: Secondary | ICD-10-CM | POA: Diagnosis not present

## 2024-04-22 DIAGNOSIS — I4891 Unspecified atrial fibrillation: Secondary | ICD-10-CM

## 2024-04-22 DIAGNOSIS — Z7901 Long term (current) use of anticoagulants: Secondary | ICD-10-CM | POA: Diagnosis not present

## 2024-04-22 DIAGNOSIS — I482 Chronic atrial fibrillation, unspecified: Secondary | ICD-10-CM

## 2024-04-22 DIAGNOSIS — Z860100 Personal history of colon polyps, unspecified: Secondary | ICD-10-CM

## 2024-04-22 MED ORDER — NA SULFATE-K SULFATE-MG SULF 17.5-3.13-1.6 GM/177ML PO SOLN: 1.0000 | ORAL | 0 refills | Status: AC

## 2024-04-22 NOTE — Patient Instructions (Signed)
 We have sent the following medications to your pharmacy for you to pick up at your convenience: Suprep   You have been scheduled for a colonoscopy. Please follow written instructions given to you at your visit today.   If you use inhalers (even only as needed), please bring them with you on the day of your procedure.  DO NOT TAKE 7 DAYS PRIOR TO TEST- Trulicity (dulaglutide) Ozempic, Wegovy (semaglutide) Mounjaro (tirzepatide) Bydureon Bcise (exanatide extended release)  DO NOT TAKE 1 DAY PRIOR TO YOUR TEST Rybelsus (semaglutide) Adlyxin (lixisenatide) Victoza (liraglutide) Byetta (exanatide) ___________________________________________________________________________   Due to recent changes in healthcare laws, you may see the results of your imaging and laboratory studies on MyChart before your provider has had a chance to review them.  We understand that in some cases there may be results that are confusing or concerning to you. Not all laboratory results come back in the same time frame and the provider may be waiting for multiple results in order to interpret others.  Please give us  48 hours in order for your provider to thoroughly review all the results before contacting the office for clarification of your results.   Dr Norleen Kiang

## 2024-04-22 NOTE — Telephone Encounter (Signed)
 Request for surgical clearance:     Endoscopy Procedure  What type of surgery is being performed?     Colonoscopy   When is this surgery scheduled?     05/19/24  What type of clearance is required ?   Pharmacy  Are there any medications that need to be held prior to surgery and how long? Eliquis  x2 days prior to procedure  Practice name and name of physician performing surgery?      [facility]  What is your office phone and fax number?      Phone- [phone]  Fax- [phone]  Anesthesia type (None, local, MAC, general) ?       MAC  Please route your response to Blondie Barks, CMA

## 2024-04-22 NOTE — Telephone Encounter (Signed)
 Pharmacy please advise on holding Eliquis  prior to colonoscopy scheduled for 05/19/2024. Thank you.

## 2024-04-22 NOTE — Progress Notes (Signed)
 HISTORY OF PRESENT ILLNESS:  Blake [name] is a 78 y.o. male, retired Customer service manager from [employer], who presents today regarding surveillance colonoscopy.  Last seen in this office January 20, 2020 regarding abdominal bloating, diastases recti abdominis, constipation, adenomatous colon polyps, and health related anxiety.  He presents today telling me that he has been doing well.  No GI complaints.  He continues on chronic anticoagulation for history of atrial fibrillation, in the form of Eliquis .  This was held previously with his colonoscopy.  Multiple prior colonoscopies including 2002, 2003, 2004, 2008, 2014, and 2020.  He has a history of multiple adenomatous colon polyps, large adenomatous colon polyps, and villous adenomatous colon polyps.  His most recent examination revealed sigmoid diverticulosis as well as diminutive adenomas.  Follow-up in 5 years recommended.  Patient states that he remains active.  Recently got back from the beach.  REVIEW OF SYSTEMS:  All non-GI ROS negative unless otherwise stated in the HPI except for itching, muscle cramps at night, urinary leakage  Past Medical History:  Diagnosis Date   Angina    none since CABG   Arthritis    Bladder spasm    takes Levsin as needed   CAD (coronary artery disease)    Cataract    Diverticulosis    Duodenal ulcer    hx of   Dysrhythmia    atrial fib post Cabg/ takes Eliquis  daily as well as Metoprolol     GERD (gastroesophageal reflux disease)    takes Protonix  daily   History of blood transfusion    not abnormal reaction   History of colon polyps    benign   History of kidney stones    hx of   Hyperlipidemia    takes Atorvastatin  daily   HYPERLIPIDEMIA 05/25/2009   Joint pain    Nocturia    PAF (paroxysmal atrial fibrillation) (HCC)     Past Surgical History:  Procedure Laterality Date   CARDIAC CATHETERIZATION     6/12   COLONOSCOPY     CORONARY ARTERY BYPASS GRAFT  03/27/2011   x3, Dr Maude Salinas Trigt     ESOPHAGOGASTRODUODENOSCOPY ENDOSCOPY  04/12/11   cauterization of bleeding ulcer  and artery in duodenum and stomach    INGUINAL HERNIA REPAIR  11/06/2011   Procedure: HERNIA REPAIR INGUINAL ADULT;  Surgeon: Krystal CHRISTELLA Spinner, MD;  Location: [facility];  Service: General;  Laterality: N/A;  Repair left inguinal hernia with mesh   WISDOM TOOTH EXTRACTION      Social History Blake [name]  reports that he quit smoking about 36 years ago. His smoking use included cigarettes. He started smoking about 61 years ago. He has a 25 pack-year smoking history. He has never used smokeless tobacco. He reports current alcohol use of about 1.0 standard drink of alcohol per week. He reports that he does not use drugs.  family history includes Atrial fibrillation in his brother; Cancer in his father; Coronary artery disease in his father; Diabetes in his sister; Heart attack (age of onset: 14) in his sister; Heart attack (age of onset: 78) in his father; Lymphoma in an other family member; Ovarian cancer in his sister.  No Known Allergies     PHYSICAL EXAMINATION: Vital signs: BP 118/72   Pulse 94   Ht 5' 7 (1.702 m)   Wt 183 lb (83 kg)   BMI 28.66 kg/m   Constitutional: Pleasant, generally well-appearing, no acute distress Psychiatric: alert and oriented x3,  cooperative Eyes: extraocular movements intact, anicteric, conjunctiva pink Mouth: oral pharynx moist, no lesions Neck: supple no lymphadenopathy Cardiovascular: heart regular rate and rhythm, no murmur Lungs: clear to auscultation bilaterally Abdomen: soft, nontender, nondistended, no obvious ascites, no peritoneal signs, normal bowel sounds, no organomegaly Rectal: Deferred until colonoscopy Extremities: no clubbing, cyanosis, or lower extremity edema bilaterally Skin: no lesions on visible extremities.  Deeply tanned Neuro: No focal deficits.  Cranial nerves intact  ASSESSMENT:  1.  Personal history of multiple advanced  adenomatous colon polyps.  Due for surveillance 2.  Multiple medical problems including history of atrial fibrillation on chronic anticoagulation   PLAN:  1.  Surveillance colonoscopy.  The patient is HIGH RISK given his comorbidities and the need to adjust anticoagulation.The nature of the procedure, as well as the risks, benefits, and alternatives were carefully and thoroughly reviewed with the patient. Ample time for discussion and questions allowed. The patient understood, was satisfied, and agreed to proceed. 2.  Hold Eliquis  2 days prior to the procedure.  Likely resume immediately post procedure.  We discussed pros and cons of interrupting Eliquis  therapy, as we have done previously.  He understands 3.  Ongoing general medical care with PCP and other specialists

## 2024-04-30 NOTE — Telephone Encounter (Signed)
 Patient with diagnosis of afib on Eliquis  for anticoagulation.    Procedure: colonoscopy  Date of procedure: 05/19/24   CHA2DS2-VASc Score = 5   This indicates a 7.2% annual risk of stroke. The patient's score is based upon: CHF History: 1 HTN History: 1 Diabetes History: 0 Stroke History: 0 Vascular Disease History: 1 Age Score: 2 Gender Score: 0  CrCl 66 mL/min Platelet count 198  Per office protocol, patient can hold Eliquis  for 2 days prior to procedure.   Patient will not need bridging with Lovenox (enoxaparin) around procedure.  **This guidance is not considered finalized until pre-operative APP has relayed final recommendations.**

## 2024-04-30 NOTE — Telephone Encounter (Signed)
   Patient Name: Blake [name]  DOB: 1945-11-12 MRN: [id number]  Primary Cardiologist: Redell Shallow, MD  Clinical pharmacists have reviewed the patient's past medical history, labs, and current medications as part of preoperative protocol coverage. The following recommendations have been made:  Patient with diagnosis of afib on Eliquis  for anticoagulation.     Procedure: colonoscopy  Date of procedure: 05/19/24   CHA2DS2-VASc Score = 5   This indicates a 7.2% annual risk of stroke. The patient's score is based upon: CHF History: 1 HTN History: 1 Diabetes History: 0 Stroke History: 0 Vascular Disease History: 1 Age Score: 2 Gender Score: 0   CrCl 66 mL/min Platelet count 198   Per office protocol, patient can hold Eliquis  for 2 days prior to procedure. Please resume when safe to do so from a bleeding standpoint.  Patient will not need bridging with Lovenox (enoxaparin) around procedure.   I will route this recommendation to the requesting party via Epic fax function and remove from pre-op pool.  Please call with questions.  Karsynn Deweese D Montia Haslip, NP 04/30/2024, 11:48 AM

## 2024-04-30 NOTE — Telephone Encounter (Signed)
 Patient informed okay to hold Eliquis  x2 days prior to procedure on 05/19/24 with Dr Abran. Patient voiced understanding.

## 2024-05-11 ENCOUNTER — Encounter: Payer: Self-pay | Admitting: Internal Medicine

## 2024-05-18 ENCOUNTER — Telehealth: Payer: Self-pay | Admitting: *Deleted

## 2024-05-18 ENCOUNTER — Telehealth: Payer: Self-pay

## 2024-05-18 NOTE — Telephone Encounter (Signed)
 Dr. Abran,  This pt is scheduled with you on 728/25, he is a difficult intubation and his procedure will need to be done at the hospital.  Best regards,  Norleen EMERSON Schillings

## 2024-05-18 NOTE — Telephone Encounter (Signed)
 Patient returning call.

## 2024-05-18 NOTE — Telephone Encounter (Signed)
 No answer on home phone;  lm on vm of cell phone:  per Norleen Schillings, patient has had difficulty intubation needs to be rescheduled from tomorrow in the [facility] to the hospital.  Will continue to try to reach him.

## 2024-05-18 NOTE — Telephone Encounter (Signed)
 Spoke with patient and explained the issue with his hx of difficult intubation;  patient was very upset and wanted to talk to Norleen Schillings, who had told me he would speak to him if necessary.  He states he has had multiple procedures in the past in the [facility] and doesn't understand what the problem is now.  I left John a message and texted him asking him to call the patient.

## 2024-05-18 NOTE — Telephone Encounter (Signed)
 Lm on wife's voicemail that we need to reschedule his procedure for tomorrow.

## 2024-05-18 NOTE — Telephone Encounter (Signed)
 Dr. Abran,  This pt is scheduled with you tomorrow, he is a difficult intubation so his procedure will need to be re-scheduled for the hospital.  Best regards,  Norleen EMERSON Schillings

## 2024-05-19 ENCOUNTER — Encounter: Admitting: Internal Medicine

## 2024-05-19 NOTE — Telephone Encounter (Signed)
 Patient spoke with Norleen Schillings who explained in detail the reason his procedure needed to be done at the hospital.  Patient understood.  I will speak to Dr. Abran to try to find him a spot as soon as possible.  I told him I would call him back after I had done so.  Patient agreed.

## 2024-05-20 ENCOUNTER — Telehealth: Payer: Self-pay

## 2024-05-20 ENCOUNTER — Other Ambulatory Visit: Payer: Self-pay

## 2024-05-20 DIAGNOSIS — Z860100 Personal history of colon polyps, unspecified: Secondary | ICD-10-CM

## 2024-05-20 NOTE — Telephone Encounter (Signed)
 Lm on patient's vm that I was scheduling his procedure at the first available hospital spot.  Did new instructions and sent them to patient's mychart.

## 2024-05-25 ENCOUNTER — Telehealth: Payer: Self-pay

## 2024-05-25 NOTE — Telephone Encounter (Signed)
Noted Thanks Magda Paganini

## 2024-05-25 NOTE — Telephone Encounter (Signed)
 After I scheduled patient's hospital procedure for October 8, patient remembered that he is going out of town for his son's wedding the next day and wanted to reschedule.  I told him we would need to wait for November to be available at which point we could reschedule.  I also told him I would keep an eye out for possible cancellations in September.  Patient agreed with this plan

## 2024-05-25 NOTE — Telephone Encounter (Signed)
 Noted by Dr. Marina Goodell

## 2024-06-09 DIAGNOSIS — R32 Unspecified urinary incontinence: Secondary | ICD-10-CM | POA: Diagnosis not present

## 2024-06-09 DIAGNOSIS — D6869 Other thrombophilia: Secondary | ICD-10-CM | POA: Diagnosis not present

## 2024-07-10 ENCOUNTER — Other Ambulatory Visit: Payer: Self-pay

## 2024-07-10 ENCOUNTER — Telehealth: Payer: Self-pay

## 2024-07-10 DIAGNOSIS — Z860100 Personal history of colon polyps, unspecified: Secondary | ICD-10-CM

## 2024-07-10 NOTE — Telephone Encounter (Signed)
 [facility] Pre-operative Risk Assessment     Request for surgical clearance:     Endoscopy Procedure  What type of surgery is being performed?     Colonoscopy  When is this surgery scheduled?     09/03/2024  What type of clearance is required ?   Pharmacy  Are there any medications that need to be held prior to surgery and how long? Eliquis  2 days (This was previously approved back in July but has been rescheduled to November so I just wanted to make sure it was still ok)  Practice name and name of physician performing surgery?      [facility]  What is your office phone and fax number?      Phone- [phone]  Fax- [phone]  Anesthesia type (None, local, MAC, general) ?       MAC   Please route your response to [facility]

## 2024-07-13 NOTE — Telephone Encounter (Signed)
 Patient with diagnosis of Afib on Eliquis  for anticoagulation.    What type of surgery is being performed?    Colonoscopy  Date of procedure?    09/03/2024  CHA2DS2-VASc Score = 4   This indicates a 4.8% annual risk of stroke. The patient's score is based upon: CHF History: 1 HTN History: 0 Diabetes History: 0 Stroke History: 0 Vascular Disease History: 1 Age Score: 2 Gender Score: 0   CrCl 67 ml/min Platelet count 198 K  Patient has not had an Afib/aflutter ablation within the last 3 months or DCCV within the last 30 days  Per office protocol, patient can hold Eliquis  for 2 days prior to procedure.   Patient will not need bridging with Lovenox (enoxaparin) around procedure.  **This guidance is not considered finalized until pre-operative APP has relayed final recommendations.**

## 2024-07-14 NOTE — Telephone Encounter (Signed)
   Patient Name: Blake [name]  DOB: 10-20-1946 MRN: [id number]  Primary Cardiologist: Redell Shallow, MD  Chart reviewed as part of pre-operative protocol coverage. Patient is scheduled to have a colonoscopy in 08/2024 and Cardiology was asked to give recommendations for holding Eliquis .   Per pharmacy and office protocol:  Patient can hold Eliquis  for 2 days prior to procedure. Patient will not need bridging with Lovenox (enoxaparin) around procedure. Please restart Eliquis  as soon as safely possible afterwards.   I will route this recommendation to the requesting party and remove from pre-op pool.  Please call with questions.  Sheretta Grumbine E Corinthian Mizrahi, PA-C 07/14/2024, 7:43 AM

## 2024-07-28 DIAGNOSIS — L720 Epidermal cyst: Secondary | ICD-10-CM | POA: Diagnosis not present

## 2024-07-28 DIAGNOSIS — Z85828 Personal history of other malignant neoplasm of skin: Secondary | ICD-10-CM | POA: Diagnosis not present

## 2024-07-29 ENCOUNTER — Ambulatory Visit (HOSPITAL_COMMUNITY): Admit: 2024-07-29 | Admitting: Internal Medicine

## 2024-07-29 ENCOUNTER — Encounter (HOSPITAL_COMMUNITY): Payer: Self-pay

## 2024-07-29 SURGERY — COLONOSCOPY
Anesthesia: Monitor Anesthesia Care

## 2024-08-26 ENCOUNTER — Encounter (HOSPITAL_COMMUNITY): Payer: Self-pay | Admitting: Internal Medicine

## 2024-08-26 ENCOUNTER — Encounter: Payer: Self-pay | Admitting: Internal Medicine

## 2024-08-26 NOTE — Progress Notes (Signed)
 Attempted to obtain medical history for pre op call via telephone, unable to reach at this time. HIPAA compliant voicemail message left requesting return call to pre surgical testing department.

## 2024-08-28 ENCOUNTER — Telehealth: Payer: Self-pay

## 2024-08-28 NOTE — Telephone Encounter (Signed)
 Procedure:COLON Procedure date: 09/02/24 Procedure location: [facility] Arrival Time: 9:00 Spoke with the patient Y/N: Y Any prep concerns? N  Has the patient obtained the prep from the pharmacy ? Y Do you have a care partner and transportation: Y Any additional concerns? N

## 2024-09-02 ENCOUNTER — Ambulatory Visit (HOSPITAL_COMMUNITY): Admitting: Certified Registered Nurse Anesthetist

## 2024-09-02 ENCOUNTER — Encounter (HOSPITAL_COMMUNITY): Payer: Self-pay | Admitting: Internal Medicine

## 2024-09-02 ENCOUNTER — Other Ambulatory Visit: Payer: Self-pay

## 2024-09-02 ENCOUNTER — Encounter (HOSPITAL_COMMUNITY)
Admission: RE | Disposition: A | Discharge status: Home / Self Care | Payer: Self-pay | Source: Home / Self Care | Attending: Internal Medicine

## 2024-09-02 ENCOUNTER — Ambulatory Visit (HOSPITAL_COMMUNITY)
Admission: RE | Admit: 2024-09-02 | Discharge: 2024-09-02 | Disposition: A | Discharge status: Home / Self Care | Attending: Internal Medicine | Admitting: Internal Medicine

## 2024-09-02 DIAGNOSIS — Z1211 Encounter for screening for malignant neoplasm of colon: Secondary | ICD-10-CM

## 2024-09-02 DIAGNOSIS — I2510 Atherosclerotic heart disease of native coronary artery without angina pectoris: Secondary | ICD-10-CM

## 2024-09-02 DIAGNOSIS — K5730 Diverticulosis of large intestine without perforation or abscess without bleeding: Secondary | ICD-10-CM

## 2024-09-02 DIAGNOSIS — Z951 Presence of aortocoronary bypass graft: Secondary | ICD-10-CM | POA: Diagnosis not present

## 2024-09-02 DIAGNOSIS — Z7901 Long term (current) use of anticoagulants: Secondary | ICD-10-CM | POA: Insufficient documentation

## 2024-09-02 DIAGNOSIS — Z87891 Personal history of nicotine dependence: Secondary | ICD-10-CM | POA: Diagnosis not present

## 2024-09-02 DIAGNOSIS — Z860100 Personal history of colon polyps, unspecified: Secondary | ICD-10-CM

## 2024-09-02 DIAGNOSIS — K5520 Angiodysplasia of colon without hemorrhage: Secondary | ICD-10-CM

## 2024-09-02 DIAGNOSIS — Z860101 Personal history of adenomatous and serrated colon polyps: Secondary | ICD-10-CM | POA: Insufficient documentation

## 2024-09-02 DIAGNOSIS — Q2730 Arteriovenous malformation, site unspecified: Secondary | ICD-10-CM | POA: Diagnosis not present

## 2024-09-02 DIAGNOSIS — I509 Heart failure, unspecified: Secondary | ICD-10-CM | POA: Diagnosis not present

## 2024-09-02 DIAGNOSIS — I480 Paroxysmal atrial fibrillation: Secondary | ICD-10-CM | POA: Diagnosis not present

## 2024-09-02 DIAGNOSIS — K648 Other hemorrhoids: Secondary | ICD-10-CM | POA: Insufficient documentation

## 2024-09-02 HISTORY — PX: COLONOSCOPY: SHX5424

## 2024-09-02 SURGERY — COLONOSCOPY
Anesthesia: Monitor Anesthesia Care

## 2024-09-02 MED ORDER — PROPOFOL 10 MG/ML IV BOLUS
INTRAVENOUS | Status: DC | PRN
  Administered 2024-09-02 (×2): 20 mg via INTRAVENOUS

## 2024-09-02 MED ORDER — LIDOCAINE 2% (20 MG/ML) 5 ML SYRINGE
INTRAMUSCULAR | Status: DC | PRN
  Administered 2024-09-02: 40 mg via INTRAVENOUS

## 2024-09-02 MED ORDER — PHENYLEPHRINE 80 MCG/ML (10ML) SYRINGE FOR IV PUSH (FOR BLOOD PRESSURE SUPPORT)
PREFILLED_SYRINGE | INTRAVENOUS | Status: DC | PRN
  Administered 2024-09-02 (×3): 80 ug via INTRAVENOUS

## 2024-09-02 MED ORDER — SODIUM CHLORIDE 0.9 % IV SOLN: INTRAVENOUS | Status: DC

## 2024-09-02 MED ORDER — PROPOFOL 500 MG/50ML IV EMUL
INTRAVENOUS | Status: DC | PRN
  Administered 2024-09-02: 180 ug/kg/min via INTRAVENOUS

## 2024-09-02 MED ORDER — PROPOFOL 10 MG/ML IV BOLUS
INTRAVENOUS | Status: AC
  Filled 2024-09-02: qty 20

## 2024-09-02 MED ORDER — LACTATED RINGERS IV SOLN: INTRAVENOUS | Status: DC | PRN

## 2024-09-02 NOTE — Anesthesia Procedure Notes (Signed)
 Procedure Name: MAC Date/Time: 09/02/2024 10:57 AM  Performed by: Buster Catheryn SAUNDERS, CRNAPre-anesthesia Checklist: Patient identified, Emergency Drugs available, Suction available, Patient being monitored and Timeout performed Patient Re-evaluated:Patient Re-evaluated prior to induction Oxygen Delivery Method: Simple face mask Placement Confirmation: positive ETCO2

## 2024-09-02 NOTE — Discharge Instructions (Signed)

## 2024-09-02 NOTE — Transfer of Care (Signed)
 Immediate Anesthesia Transfer of Care Note  Patient: Blake [name]  Procedure(s) Performed: COLONOSCOPY  Patient Location: PACU  Anesthesia Type:MAC  Level of Consciousness: awake, alert , and oriented  Airway & Oxygen Therapy: Patient Spontanous Breathing and Patient connected to face mask oxygen  Post-op Assessment: Report given to RN and Post -op Vital signs reviewed and stable  Post vital signs: Reviewed and stable  Last Vitals:  Vitals Value Taken Time  BP    Temp    Pulse 73 09/02/24 11:34  Resp 18 09/02/24 11:34  SpO2 94 % 09/02/24 11:34  Vitals shown include unfiled device data.  Last Pain:  Vitals:   09/02/24 0917  TempSrc: Temporal  PainSc: 0-No pain         Complications: No notable events documented.

## 2024-09-02 NOTE — Anesthesia Preprocedure Evaluation (Addendum)
 Anesthesia Evaluation  Patient identified by MRN, date of birth, ID band Patient awake    Reviewed: Allergy & Precautions, H&P , NPO status , Patient's Chart, lab work & pertinent test results  History of Anesthesia Complications Negative for: history of anesthetic complications  Airway Mallampati: II  TM Distance: >3 FB Neck ROM: Full    Dental no notable dental hx.    Pulmonary former smoker   Pulmonary exam normal breath sounds clear to auscultation       Cardiovascular (-) angina + CAD and + CABG  Normal cardiovascular exam+ dysrhythmias Atrial Fibrillation  Rhythm:Regular Rate:Normal     Neuro/Psych neg Seizures PSYCHIATRIC DISORDERS Anxiety     negative neurological ROS     GI/Hepatic Neg liver ROS, PUD,GERD  ,,  Endo/Other  negative endocrine ROS    Renal/GU negative Renal ROS  negative genitourinary   Musculoskeletal  (+) Arthritis ,    Abdominal   Peds negative pediatric ROS (+)  Hematology negative hematology ROS (+)   Anesthesia Other Findings   Reproductive/Obstetrics negative OB ROS                              Anesthesia Physical Anesthesia Plan  ASA: 3  Anesthesia Plan: MAC   Post-op Pain Management:    Induction: Intravenous  PONV Risk Score and Plan: 1 and Propofol  infusion and Treatment may vary due to age or medical condition  Airway Management Planned: Natural Airway  Additional Equipment: None  Intra-op Plan:   Post-operative Plan:   Informed Consent: I have reviewed the patients History and Physical, chart, labs and discussed the procedure including the risks, benefits and alternatives for the proposed anesthesia with the patient or authorized representative who has indicated his/her understanding and acceptance.     Dental advisory given  Plan Discussed with: CRNA  Anesthesia Plan Comments:         Anesthesia Quick Evaluation

## 2024-09-02 NOTE — Op Note (Signed)
 [facility] Patient Name: Blake [name] Procedure Date: 09/02/2024 MRN: [id number] Attending MD: Norleen SAILOR. Abran , MD, [id number] Date of Birth: 11/26/1945 CSN: [id number] Age: 78 Admit Type: Outpatient Procedure:                Colonoscopy Indications:              High risk colon cancer surveillance: Personal                            history of adenoma (10 mm or greater in size), High                            risk colon cancer surveillance: Personal history of                            adenoma with villous component, High risk colon                            cancer surveillance: Personal history of multiple                            (3 or more) adenomas. Previous examinations 2002,                            2003, 2000 recall 2008, 2014, 2020 Providers:                Norleen SAILOR. Abran, MD, Ozell Pouch, Felice Sar,                            Technician Referring MD:             Lynwood Norleen, MD Medicines:                Monitored Anesthesia Care Complications:            No immediate complications. Estimated blood loss:                            None. Estimated Blood Loss:     Estimated blood loss: none. Procedure:                Pre-Anesthesia Assessment:                           - Prior to the procedure, a History and Physical                            was performed, and patient medications and                            allergies were reviewed. The patient's tolerance of                            previous anesthesia was also reviewed. The risks  and benefits of the procedure and the sedation                            options and risks were discussed with the patient.                            All questions were answered, and informed consent                            was obtained. Prior Anticoagulants: The patient has                            taken Eliquis  (apixaban ), last dose was 3 days                            prior  to procedure. ASA Grade Assessment: III - A                            patient with severe systemic disease. After                            reviewing the risks and benefits, the patient was                            deemed in satisfactory condition to undergo the                            procedure.                           After obtaining informed consent, the colonoscope                            was passed under direct vision. Throughout the                            procedure, the patient's blood pressure, pulse, and                            oxygen saturations were monitored continuously. The                            CF-HQ190L ([id number]) Olympus colonoscope was                            introduced through the anus and advanced to the the                            cecum, identified by appendiceal orifice and                            ileocecal valve. The ileocecal valve, appendiceal  orifice, and rectum were photographed. The quality                            of the bowel preparation was excellent. The                            colonoscopy was performed without difficulty. The                            patient tolerated the procedure well. The bowel                            preparation used was SUPREP via split dose                            instruction. Scope In: 11:12:52 AM Scope Out: 11:29:40 AM Scope Withdrawal Time: 0 hours 14 minutes 39 seconds  Total Procedure Duration: 0 hours 16 minutes 48 seconds  Findings:      Diverticula were found in the sigmoid colon. Nonbleeding AVM just       outside of the cecum      Internal hemorrhoids were found during retroflexion. The hemorrhoids       were small.      The exam was otherwise without abnormality on direct and retroflexion       views. Impression:               - Diverticulosis in the sigmoid colon. Right sided                            AVM.                           - Internal  hemorrhoids.                           - The examination was otherwise normal on direct                            and retroflexion views.                           - No specimens collected. Moderate Sedation:      none Recommendation:           - Repeat colonoscopy is not recommended for                            surveillance.                           - Resume Eliquis  (apixaban ) today at prior dose.                           - Patient has a contact number available for                            emergencies. The signs and symptoms of potential  delayed complications were discussed with the                            patient. Return to normal activities tomorrow.                            Written discharge instructions were provided to the                            patient.                           - Resume previous diet.                           - Continue present medications. Procedure Code(s):        --- Professional ---                           [phone], Colonoscopy, flexible; diagnostic, including                            collection of specimen(s) by brushing or washing,                            when performed (separate procedure) Diagnosis Code(s):        --- Professional ---                           K64.8, Other hemorrhoids                           Z86.010, Personal history of colonic polyps                           K57.30, Diverticulosis of large intestine without                            perforation or abscess without bleeding CPT copyright 2022 American Medical Association. All rights reserved. The codes documented in this report are preliminary and upon coder review may  be revised to meet current compliance requirements. Norleen SAILOR. Abran, MD 09/02/2024 11:40:59 AM This report has been signed electronically. Number of Addenda: 0

## 2024-09-02 NOTE — H&P (Signed)
 Expand All Collapse All HISTORY OF PRESENT ILLNESS:   Blake [name] is a 78 y.o. 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  Last seen in this office January 20, 2020 regarding abdominal bloating, diastases recti abdominis, constipation, adenomatous colon polyps, and health related anxiety.  He presents today telling me that he has been doing well.  No GI complaints.  He continues on chronic anticoagulation for history of atrial fibrillation, in the form of Eliquis .  This was held previously with his colonoscopy.   Multiple prior colonoscopies including 2002, 2003, 2004, 2008, 2014, and 2020.  He has a history of multiple adenomatous colon polyps, large adenomatous colon polyps, and villous adenomatous colon polyps.  His most recent examination revealed sigmoid diverticulosis as well as diminutive adenomas.  Follow-up in 5 years recommended.  Patient states that he remains active.  Recently got back from the beach.   REVIEW OF SYSTEMS:   All non-GI ROS negative unless otherwise stated in the HPI except for itching, muscle cramps at night, urinary leakage       Past Medical History:  Diagnosis Date   Angina      none since CABG   Arthritis     Bladder spasm      takes Levsin as needed   CAD (coronary artery disease)     Cataract     Diverticulosis     Duodenal ulcer      hx of   Dysrhythmia      atrial fib post Cabg/ takes Eliquis  daily as well as Metoprolol     GERD (gastroesophageal reflux disease)      takes Protonix  daily   History of blood transfusion      not abnormal reaction   History of colon polyps      benign   History of kidney stones      hx of   Hyperlipidemia      takes Atorvastatin  daily   HYPERLIPIDEMIA 05/25/2009   Joint pain     Nocturia     PAF (paroxysmal atrial fibrillation) (HCC)                 Past Surgical History:  Procedure Laterality Date   CARDIAC CATHETERIZATION        6/12    COLONOSCOPY       CORONARY ARTERY BYPASS GRAFT   03/27/2011    x3, Dr Maude Salinas Trigt     ESOPHAGOGASTRODUODENOSCOPY ENDOSCOPY   04/12/11    cauterization of bleeding ulcer  and artery in duodenum and stomach    INGUINAL HERNIA REPAIR   11/06/2011    Procedure: HERNIA REPAIR INGUINAL ADULT;  Surgeon: Krystal CHRISTELLA Spinner, MD;  Location: [facility];  Service: General;  Laterality: N/A;  Repair left inguinal hernia with mesh   WISDOM TOOTH EXTRACTION              Social History Blake [name]  reports that he quit smoking about 36 years ago. His smoking use included cigarettes. He started smoking about 61 years ago. He has a 25 pack-year smoking history. He has never used smokeless tobacco. He reports current alcohol use of about 1.0 standard drink of alcohol per week. He reports that he does not use drugs.   family history includes Atrial fibrillation in his brother; Cancer in his father; Coronary artery disease in his father; Diabetes in his sister; Heart attack (age of onset: 48) in his sister; Heart attack (  age of onset: 66) in his father; Lymphoma in an other family member; Ovarian cancer in his sister.   Allergies  No Known Allergies         PHYSICAL EXAMINATION: Vital signs: BP 118/72   Pulse 94   Ht 5' 7 (1.702 m)   Wt 183 lb (83 kg)   BMI 28.66 kg/m   Constitutional: Pleasant, generally well-appearing, no acute distress Psychiatric: alert and oriented x3, cooperative Eyes: extraocular movements intact, anicteric, conjunctiva pink Mouth: oral pharynx moist, no lesions Neck: supple no lymphadenopathy Cardiovascular: heart regular rate and rhythm, no murmur Lungs: clear to auscultation bilaterally Abdomen: soft, nontender, nondistended, no obvious ascites, no peritoneal signs, normal bowel sounds, no organomegaly Rectal: Deferred until colonoscopy Extremities: no clubbing, cyanosis, or lower extremity edema bilaterally Skin: no lesions on visible extremities.  Deeply  tanned Neuro: No focal deficits.  Cranial nerves intact   ASSESSMENT:   1.  Personal history of multiple advanced adenomatous colon polyps.  Due for surveillance 2.  Multiple medical problems including history of atrial fibrillation on chronic anticoagulation     PLAN:   1.  Surveillance colonoscopy.  The patient is HIGH RISK given his comorbidities and the need to adjust anticoagulation.The nature of the procedure, as well as the risks, benefits, and alternatives were carefully and thoroughly reviewed with the patient. Ample time for discussion and questions allowed. The patient understood, was satisfied, and agreed to proceed. 2.  Hold Eliquis  2 days prior to the procedure.  Likely resume immediately post procedure.  We discussed pros and cons of interrupting Eliquis  therapy, as we have done previously.  He understands 3.  Ongoing general medical care with PCP and other specialists      Recent office H&P as above.  No interval change in history or physical exam.  He is for surveillance colonoscopy.  Patient procedure performed in the hospital setting due to a documented history of difficult intubation.

## 2024-09-02 NOTE — Anesthesia Postprocedure Evaluation (Signed)
 Anesthesia Post Note  Patient: Blake [name]  Procedure(s) Performed: COLONOSCOPY     Patient location during evaluation: PACU Anesthesia Type: MAC Level of consciousness: awake and alert Pain management: pain level controlled Vital Signs Assessment: post-procedure vital signs reviewed and stable Respiratory status: spontaneous breathing, nonlabored ventilation, respiratory function stable and patient connected to nasal cannula oxygen Cardiovascular status: stable and blood pressure returned to baseline Postop Assessment: no apparent nausea or vomiting Anesthetic complications: no   No notable events documented.  Last Vitals:  Vitals:   09/02/24 1150 09/02/24 1200  BP: 94/67 127/70  Pulse: 80 70  Resp: (!) 24 17  Temp:    SpO2: 96% 97%    Last Pain:  Vitals:   09/02/24 1200  TempSrc:   PainSc: 0-No pain                 Blake [name]

## 2024-09-04 ENCOUNTER — Encounter (HOSPITAL_COMMUNITY): Payer: Self-pay | Admitting: Internal Medicine

## 2024-09-04 ENCOUNTER — Other Ambulatory Visit: Payer: Self-pay | Admitting: Cardiology

## 2024-09-04 DIAGNOSIS — I482 Chronic atrial fibrillation, unspecified: Secondary | ICD-10-CM

## 2024-09-04 NOTE — Telephone Encounter (Signed)
 Prescription refill request for Eliquis  received. Indication: a fib Last office visit: 12/03/23 Scr: 0.93 [facility] 09/16/23 Age: 78 Weight: 80kg

## 2024-09-09 DIAGNOSIS — K080 Exfoliation of teeth due to systemic causes: Secondary | ICD-10-CM | POA: Diagnosis not present

## 2024-11-02 ENCOUNTER — Telehealth: Payer: Self-pay | Admitting: Cardiology

## 2024-11-02 NOTE — Telephone Encounter (Signed)
 Patient has concerns about his Heart health he would like to go over before his Trip in April. States he wants to make sure he is safe to travel.

## 2024-11-03 NOTE — Telephone Encounter (Signed)
 Left message for patient to call back

## 2024-11-06 NOTE — Telephone Encounter (Signed)
 Left message for patient, he has an appointment 12/10/24 to discuss concerns about traveling. He is to call back if that does not work for him.

## 2024-11-10 ENCOUNTER — Ambulatory Visit: Payer: Self-pay | Admitting: Internal Medicine

## 2024-11-10 ENCOUNTER — Encounter: Payer: Self-pay | Admitting: Internal Medicine

## 2024-11-10 ENCOUNTER — Ambulatory Visit: Admitting: Internal Medicine

## 2024-11-10 VITALS — BP 118/74 | HR 100 | Temp 99.0°F | Ht 66.0 in | Wt 187.0 lb

## 2024-11-10 DIAGNOSIS — M5412 Radiculopathy, cervical region: Secondary | ICD-10-CM | POA: Diagnosis not present

## 2024-11-10 DIAGNOSIS — K5900 Constipation, unspecified: Secondary | ICD-10-CM | POA: Insufficient documentation

## 2024-11-10 DIAGNOSIS — R739 Hyperglycemia, unspecified: Secondary | ICD-10-CM

## 2024-11-10 DIAGNOSIS — E559 Vitamin D deficiency, unspecified: Secondary | ICD-10-CM

## 2024-11-10 DIAGNOSIS — R972 Elevated prostate specific antigen [PSA]: Secondary | ICD-10-CM

## 2024-11-10 DIAGNOSIS — E538 Deficiency of other specified B group vitamins: Secondary | ICD-10-CM

## 2024-11-10 DIAGNOSIS — F5101 Primary insomnia: Secondary | ICD-10-CM | POA: Diagnosis not present

## 2024-11-10 DIAGNOSIS — N3281 Overactive bladder: Secondary | ICD-10-CM

## 2024-11-10 DIAGNOSIS — G4700 Insomnia, unspecified: Secondary | ICD-10-CM | POA: Insufficient documentation

## 2024-11-10 LAB — LIPID PANEL
Cholesterol: 101 mg/dL (ref 28–200)
HDL: 40 mg/dL
LDL Cholesterol: 55 mg/dL (ref 10–99)
NonHDL: 61.22
Total CHOL/HDL Ratio: 3
Triglycerides: 33 mg/dL (ref 10.0–149.0)
VLDL: 6.6 mg/dL (ref 0.0–40.0)

## 2024-11-10 LAB — BASIC METABOLIC PANEL WITH GFR
BUN: 18 mg/dL (ref 6–23)
CO2: 30 meq/L (ref 19–32)
Calcium: 9.7 mg/dL (ref 8.4–10.5)
Chloride: 105 meq/L (ref 96–112)
Creatinine, Ser: 1.04 mg/dL (ref 0.40–1.50)
GFR: 67.81 mL/min
Glucose, Bld: 96 mg/dL (ref 70–99)
Potassium: 4.2 meq/L (ref 3.5–5.1)
Sodium: 139 meq/L (ref 135–145)

## 2024-11-10 LAB — VITAMIN B12: Vitamin B-12: 875 pg/mL (ref 211–911)

## 2024-11-10 LAB — HEPATIC FUNCTION PANEL
ALT: 12 U/L (ref 3–53)
AST: 14 U/L (ref 5–37)
Albumin: 4.2 g/dL (ref 3.5–5.2)
Alkaline Phosphatase: 52 U/L (ref 39–117)
Bilirubin, Direct: 0.2 mg/dL (ref 0.1–0.3)
Total Bilirubin: 0.7 mg/dL (ref 0.2–1.2)
Total Protein: 6.7 g/dL (ref 6.0–8.3)

## 2024-11-10 LAB — PSA: PSA: 7.49 ng/mL — ABNORMAL HIGH (ref 0.10–4.00)

## 2024-11-10 LAB — VITAMIN D 25 HYDROXY (VIT D DEFICIENCY, FRACTURES): VITD: 53.78 ng/mL (ref 30.00–100.00)

## 2024-11-10 LAB — HEMOGLOBIN A1C: Hgb A1c MFr Bld: 5.7 % (ref 4.6–6.5)

## 2024-11-10 MED ORDER — METHOCARBAMOL 500 MG PO TABS: 500.0000 mg | ORAL_TABLET | Freq: Every evening | ORAL | 1 refills | Status: AC | PRN

## 2024-11-10 MED ORDER — MIRABEGRON ER 50 MG PO TB24: 50.0000 mg | ORAL_TABLET | Freq: Every day | ORAL | 3 refills | Status: AC

## 2024-11-10 MED ORDER — ZOLPIDEM TARTRATE 5 MG PO TABS: 5.0000 mg | ORAL_TABLET | Freq: Every evening | ORAL | 2 refills | Status: AC | PRN

## 2024-11-10 NOTE — Assessment & Plan Note (Signed)
 With mild more frequent and severe pain, better with using a pillow in a certain way at night, and no LUE symptoms - d/w pt, he declines MRI for now,  to f/u any worsening symptoms or concerns

## 2024-11-10 NOTE — Assessment & Plan Note (Signed)
 Mild to mod, for ambien  5 mg at bedtime prn,  to f/u any worsening symptoms or concerns

## 2024-11-10 NOTE — Patient Instructions (Signed)
 Ok to increase the myrbetriq  to 50 mg per day  Please take all new medication as prescribed - the robaxin  as needed at bedtime and stay hydrated  Please take all new medication as prescribed - the Ambien  5 mg at bedtime as needed  Ok to take OTC Miralax  17 mg per day as needed  Please continue all other medications as before, and refills have been done if requested.  Please have the pharmacy call with any other refills you may need.  Please keep your appointments with your specialists as you may have planned - Urology next month  Please go to the LAB at the blood drawing area for the tests to be done - just the PSA today  You will be contacted by phone if any changes need to be made immediately.  Otherwise, you will receive a letter about your results with an explanation, but please check with MyChart first.  Please make an Appointment to return in 6 months, or sooner if needed

## 2024-11-10 NOTE — Assessment & Plan Note (Signed)
 Mild intermittent recently, for otc every day miralax  17 gm

## 2024-11-10 NOTE — Progress Notes (Signed)
 Patient ID: Blake [name], male   DOB: 09/02/1946, 79 y.o.   MRN: [id number]        Chief Complaint: follow up left cervical radiculitis, bph with OAB, constipation, insomnia, leg cramps nocturnal, elevated PSA       HPI:  Blake [name] is a 79 y.o. male here with several concerns as he is leaving soon for 9 day trip to [place], last visited 15 yrs ago.   Does has mild increased severity frequency of left neck pain with radiation to the shoulder, has hx cervical spine stenosis, wants to know next steps if any, though pain still mild, and no LUE weakness or distal radiation.  Sees urology for BPH, but also has urinary frequency where myrbetriq  25 mg helps, but not enough.  Has urology f/u planned for next month.  Also has mild intermittent insomnia, believes will be worse with his 20 hr plane flight soon.   Also has nocturnal leg cramps for several years nearly every night, but finally last wk increased his fluid intake and symptoms seem improved.  Denies worsening reflux, abd pain, dysphagia, n/v, blood but also has mild intermittent constipation Last psa has been elevated asking for repeat today and f/u urology soon as planned.      Wt Readings from Last 3 Encounters:  11/10/24 187 lb (84.8 kg)  09/02/24 177 lb (80.3 kg)  04/22/24 183 lb (83 kg)   BP Readings from Last 3 Encounters:  11/10/24 118/74  09/02/24 127/70  04/22/24 118/72         Past Medical History:  Diagnosis Date   Angina    none since CABG   Arthritis    Bladder spasm    takes Levsin as needed   CAD (coronary artery disease)    Cataract    Diverticulosis    Duodenal ulcer    hx of   Dysrhythmia    atrial fib post Cabg/ takes Eliquis  daily as well as Metoprolol     GERD (gastroesophageal reflux disease)    takes Protonix  daily   History of blood transfusion    not abnormal reaction   History of colon polyps    benign   History of kidney stones    hx of   Hyperlipidemia    takes Atorvastatin  daily    HYPERLIPIDEMIA 05/25/2009   Joint pain    Nocturia    PAF (paroxysmal atrial fibrillation) (HCC)    Past Surgical History:  Procedure Laterality Date   CARDIAC CATHETERIZATION     6/12   COLONOSCOPY     COLONOSCOPY N/A 09/02/2024   Procedure: COLONOSCOPY;  Surgeon: Abran Norleen SAILOR, MD;  Location: [facility];  Service: Gastroenterology;  Laterality: N/A;   CORONARY ARTERY BYPASS GRAFT  03/27/2011   x3, Dr Maude Salinas Trigt     ESOPHAGOGASTRODUODENOSCOPY ENDOSCOPY  04/12/11   cauterization of bleeding ulcer  and artery in duodenum and stomach    INGUINAL HERNIA REPAIR  11/06/2011   Procedure: HERNIA REPAIR INGUINAL ADULT;  Surgeon: Krystal CHRISTELLA Spinner, MD;  Location: [facility];  Service: General;  Laterality: N/A;  Repair left inguinal hernia with mesh   WISDOM TOOTH EXTRACTION      reports that he quit smoking about 36 years ago. His smoking use included cigarettes. He started smoking about 61 years ago. He has a 25 pack-year smoking history. He has never used smokeless tobacco. He reports current alcohol use of about 1.0 standard drink of alcohol per week. He reports that he  does not use drugs. family history includes Atrial fibrillation in his brother; Cancer in his father; Coronary artery disease in his father; Diabetes in his sister; Heart attack (age of onset: 20) in his sister; Heart attack (age of onset: 1) in his father; Lymphoma in an other family member; Ovarian cancer in his sister. Allergies[1] Medications Ordered Prior to Encounter[2]      ROS:  All others reviewed and negative.  Objective        PE:  BP 118/74 (BP Location: Right Arm, Patient Position: Sitting, Cuff Size: Normal)   Pulse 100   Temp 99 F (37.2 C) (Oral)   Ht 5' 6 (1.676 m)   Wt 187 lb (84.8 kg)   SpO2 98%   BMI 30.18 kg/m                 Constitutional: Pt appears in NAD               HENT: Head: NCAT.                Right Ear: External ear normal.                 Left Ear: External ear normal.                 Eyes: . Pupils are equal, round, and reactive to light. Conjunctivae and EOM are normal               Nose: without d/c or deformity               Neck: Neck supple. Gross normal ROM               Cardiovascular: Normal rate and regular rhythm.                 Pulmonary/Chest: Effort normal and breath sounds without rales or wheezing.                Abd:  Soft, NT, ND, + BS, no organomegaly               Neurological: Pt is alert. At baseline orientation, motor grossly intact               Skin: Skin is warm. No rashes, no other new lesions, LE edema - none               Psychiatric: Pt behavior is normal without agitation   Micro: none  Cardiac tracings I have personally interpreted today:  none  Pertinent Radiological findings (summarize): none   Lab Results  Component Value Date   WBC 9.4 09/16/2023   HGB 13.4 09/16/2023   HCT 40.0 09/16/2023   PLT 198.0 09/16/2023   GLUCOSE 84 09/16/2023   CHOL 121 03/31/2024   TRIG 73 03/31/2024   HDL 38 (L) 03/31/2024   LDLCALC 68 03/31/2024   ALT 16 03/31/2024   AST 18 03/31/2024   NA 138 09/16/2023   K 4.5 09/16/2023   CL 105 09/16/2023   CREATININE 0.93 09/16/2023   BUN 19 09/16/2023   CO2 26 09/16/2023   TSH 0.89 09/16/2023   PSA 7.41 (H) 12/26/2021   INR 1.05 01/29/2017   HGBA1C 5.5 05/16/2023   Assessment/Plan:  Blake [name] is a 78 y.o. White or Caucasian [1] male with  has a past medical history of Angina, Arthritis, Bladder spasm, CAD (coronary artery disease), Cataract, Diverticulosis, Duodenal ulcer, Dysrhythmia, GERD (gastroesophageal reflux disease), History  of blood transfusion, History of colon polyps, History of kidney stones, Hyperlipidemia, HYPERLIPIDEMIA (05/25/2009), Joint pain, Nocturia, and PAF (paroxysmal atrial fibrillation) (HCC).  Radiculitis of left cervical region With mild more frequent and severe pain, better with using a pillow in a certain way at night, and no LUE symptoms - d/w pt, he declines  MRI for now,  to f/u any worsening symptoms or concerns  PSA elevation Lab Results  Component Value Date   PSA 7.41 (H) 12/26/2021   PSA 8.86 (H) 04/26/2021   PSA 5.6 12/09/2019  For f/u lab today, f/u urology as planned soon    OAB (overactive bladder) Mild to mod persistent - ok for increased myrbetriq  50 mg qd  Constipation Mild intermittent recently, for otc every day miralax  17 gm  Insomnia Mild to mod, for ambien  5 mg at bedtime prn,  to f/u any worsening symptoms or concerns  Followup: Return in about 6 months (around 05/10/2025).  Lynwood Rush, MD 11/10/2024 12:54 PM [facility]     [1] No Known Allergies [2]  Current Outpatient Medications on File Prior to Visit  Medication Sig Dispense Refill   apixaban  (ELIQUIS ) 5 MG TABS tablet TAKE 1 TABLET BY MOUTH 2 TIMES A DAY 180 tablet 1   Cholecalciferol (VITAMIN D3) 50 MCG (2000 UT) CAPS Take by mouth.     ezetimibe  (ZETIA ) 10 MG tablet Take 1 tablet (10 mg total) by mouth daily. 90 tablet 3   metoprolol  tartrate (LOPRESSOR ) 50 MG tablet Take 1 tablet (50 mg total) by mouth 2 (two) times daily. 180 tablet 3   Na Sulfate-K Sulfate-Mg Sulfate concentrate (SUPREP BOWEL PREP KIT) 17.5-3.13-1.6 GM/177ML SOLN Take 1 kit (354 mLs total) by mouth as directed. For colonoscopy prep 354 mL 0   vitamin B-12 (CYANOCOBALAMIN ) 500 MCG tablet Take 500 mcg by mouth daily.     pravastatin  (PRAVACHOL ) 40 MG tablet Take 1 tablet (40 mg total) by mouth every evening. 90 tablet 3   No current facility-administered medications on file prior to visit.

## 2024-11-10 NOTE — Progress Notes (Signed)
 The test results show that your current treatment is OK, as the tests are stable.  Please continue the same plan.  There is no other need for change of treatment or further evaluation based on these results, at this time.  thanks

## 2024-11-10 NOTE — Assessment & Plan Note (Signed)
 Mild to mod persistent - ok for increased myrbetriq  50 mg qd

## 2024-11-10 NOTE — Assessment & Plan Note (Signed)
 Lab Results  Component Value Date   PSA 7.41 (H) 12/26/2021   PSA 8.86 (H) 04/26/2021   PSA 5.6 12/09/2019  For f/u lab today, f/u urology as planned soon

## 2024-11-25 ENCOUNTER — Other Ambulatory Visit: Payer: Self-pay | Admitting: Urology

## 2024-11-25 ENCOUNTER — Telehealth: Payer: Self-pay | Admitting: Cardiology

## 2024-11-25 NOTE — Telephone Encounter (Signed)
"  ° °  Pre-operative Risk Assessment    Patient Name: Blake [name]  DOB: [birth year] MRN: [id number]   Date of last office visit: 12/03/23 Date of next office visit: 12/10/24   Request for Surgical Clearance    Procedure:  transurethral resection of prostate   Date of Surgery:  Clearance 12/18/24                                Surgeon:  Dr. Norleen Renshaw Surgeon's Group or Practice Name:  [facility] Phone number:  [phone] Fax number:  [phone]   Type of Clearance Requested:   - Medical  - Pharmacy:  Hold Apixaban  (Eliquis ) 2 days   Type of Anesthesia:  General    Additional requests/questions:     Bonney Barbee DELENA Claudene   11/25/2024, 4:25 PM   "

## 2024-11-26 NOTE — Telephone Encounter (Signed)
" ° °  Name: Blake [name]  DOB: 03-Jun-1946  MRN: [id number]  Primary Cardiologist: Redell Shallow, MD  Chart reviewed as part of pre-operative protocol coverage. Because of Blake [name]'s past medical history and time since last visit, he will require a follow-up in-office visit in order to better assess preoperative cardiovascular risk.  Pre-op covering staff: - Please schedule appointment and call patient to inform them. If patient already had an upcoming appointment within acceptable timeframe, please add pre-op clearance to the appointment notes so provider is aware. - Please contact requesting surgeon's office via preferred method (i.e, phone, fax) to inform them of need for appointment prior to surgery.  Per office protocol, patient can hold Eliquis  for 2 days prior to procedure. Please resume when medically safe to do so.   Orren LOISE Fabry, PA-C  11/26/2024, 4:26 PM   "

## 2024-11-26 NOTE — Telephone Encounter (Signed)
 Patient with diagnosis of Afib on Eliquis  for anticoagulation.    Procedure:  transurethral resection of prostate  Date of procedure: 12/18/2024   CHA2DS2-VASc Score = 5   This indicates a 7.2% annual risk of stroke. The patient's score is based upon: CHF History: 1 HTN History: 1 Diabetes History: 0 Stroke History: 0 Vascular Disease History: 1 Age Score: 2 Gender Score: 0      CrCl 58 mL/min Platelet count need updated CBC   Patient has not had an Afib/aflutter ablation in the last 3 months, DCCV within the last 4 weeks or a watchman implanted in the last 45 days   Per office protocol, patient can hold Eliquis  for 2 days prior to procedure.     **This guidance is not considered finalized until pre-operative APP has relayed final recommendations.**

## 2024-11-26 NOTE — Telephone Encounter (Signed)
 Patient has an appointment on 2/19 clearance can be addressed at office visit

## 2024-12-08 ENCOUNTER — Encounter (HOSPITAL_COMMUNITY)

## 2024-12-10 ENCOUNTER — Ambulatory Visit: Admitting: Cardiology

## 2024-12-18 ENCOUNTER — Ambulatory Visit (HOSPITAL_COMMUNITY): Admit: 2024-12-18 | Admitting: Urology

## 2025-02-17 ENCOUNTER — Ambulatory Visit
